# Patient Record
Sex: Female | Born: 1935 | Race: White | Hispanic: No | Smoking: Former smoker
Health system: Southern US, Community
[De-identification: ages and names within clinical notes are randomized; demographics above are authoritative.]

## PROBLEM LIST (undated history)

## (undated) DIAGNOSIS — E119 Type 2 diabetes mellitus without complications: Secondary | ICD-10-CM

## (undated) DIAGNOSIS — I10 Essential (primary) hypertension: Secondary | ICD-10-CM

## (undated) HISTORY — PX: EYE SURGERY: SHX253

---

## 1974-07-12 HISTORY — PX: ABDOMINAL HYSTERECTOMY: SUR658

## 2011-03-22 ENCOUNTER — Ambulatory Visit
Admission: RE | Admit: 2011-03-22 | Discharge: 2011-03-22 | Disposition: A | Discharge status: Home / Self Care | Payer: Medicare Other | Source: Ambulatory Visit | Attending: Orthopedic Surgery | Admitting: Orthopedic Surgery

## 2011-03-22 ENCOUNTER — Other Ambulatory Visit: Payer: Self-pay | Admitting: Orthopedic Surgery

## 2011-03-22 ENCOUNTER — Encounter (HOSPITAL_BASED_OUTPATIENT_CLINIC_OR_DEPARTMENT_OTHER)
Admission: RE | Admit: 2011-03-22 | Discharge: 2011-03-22 | Disposition: A | Discharge status: Home / Self Care | Payer: Medicare Other | Source: Ambulatory Visit | Attending: Orthopedic Surgery | Admitting: Orthopedic Surgery

## 2011-03-22 DIAGNOSIS — Z01811 Encounter for preprocedural respiratory examination: Secondary | ICD-10-CM

## 2011-03-22 LAB — BASIC METABOLIC PANEL
CO2: 28 mEq/L (ref 19–32)
Calcium: 9.9 mg/dL (ref 8.4–10.5)
Creatinine, Ser: <0.47 mg/dL — ABNORMAL LOW (ref 0.50–1.10)

## 2011-03-22 IMAGING — CR DG CHEST 2V
2 series · 2 of 2 positions shown · non-contrast
Comparison: None.

CLINICAL DATA: Preoperative respiratory evaluation prior to hand
surgery.

CHEST - 2 VIEW 03/22/2011:

[w chest pa]
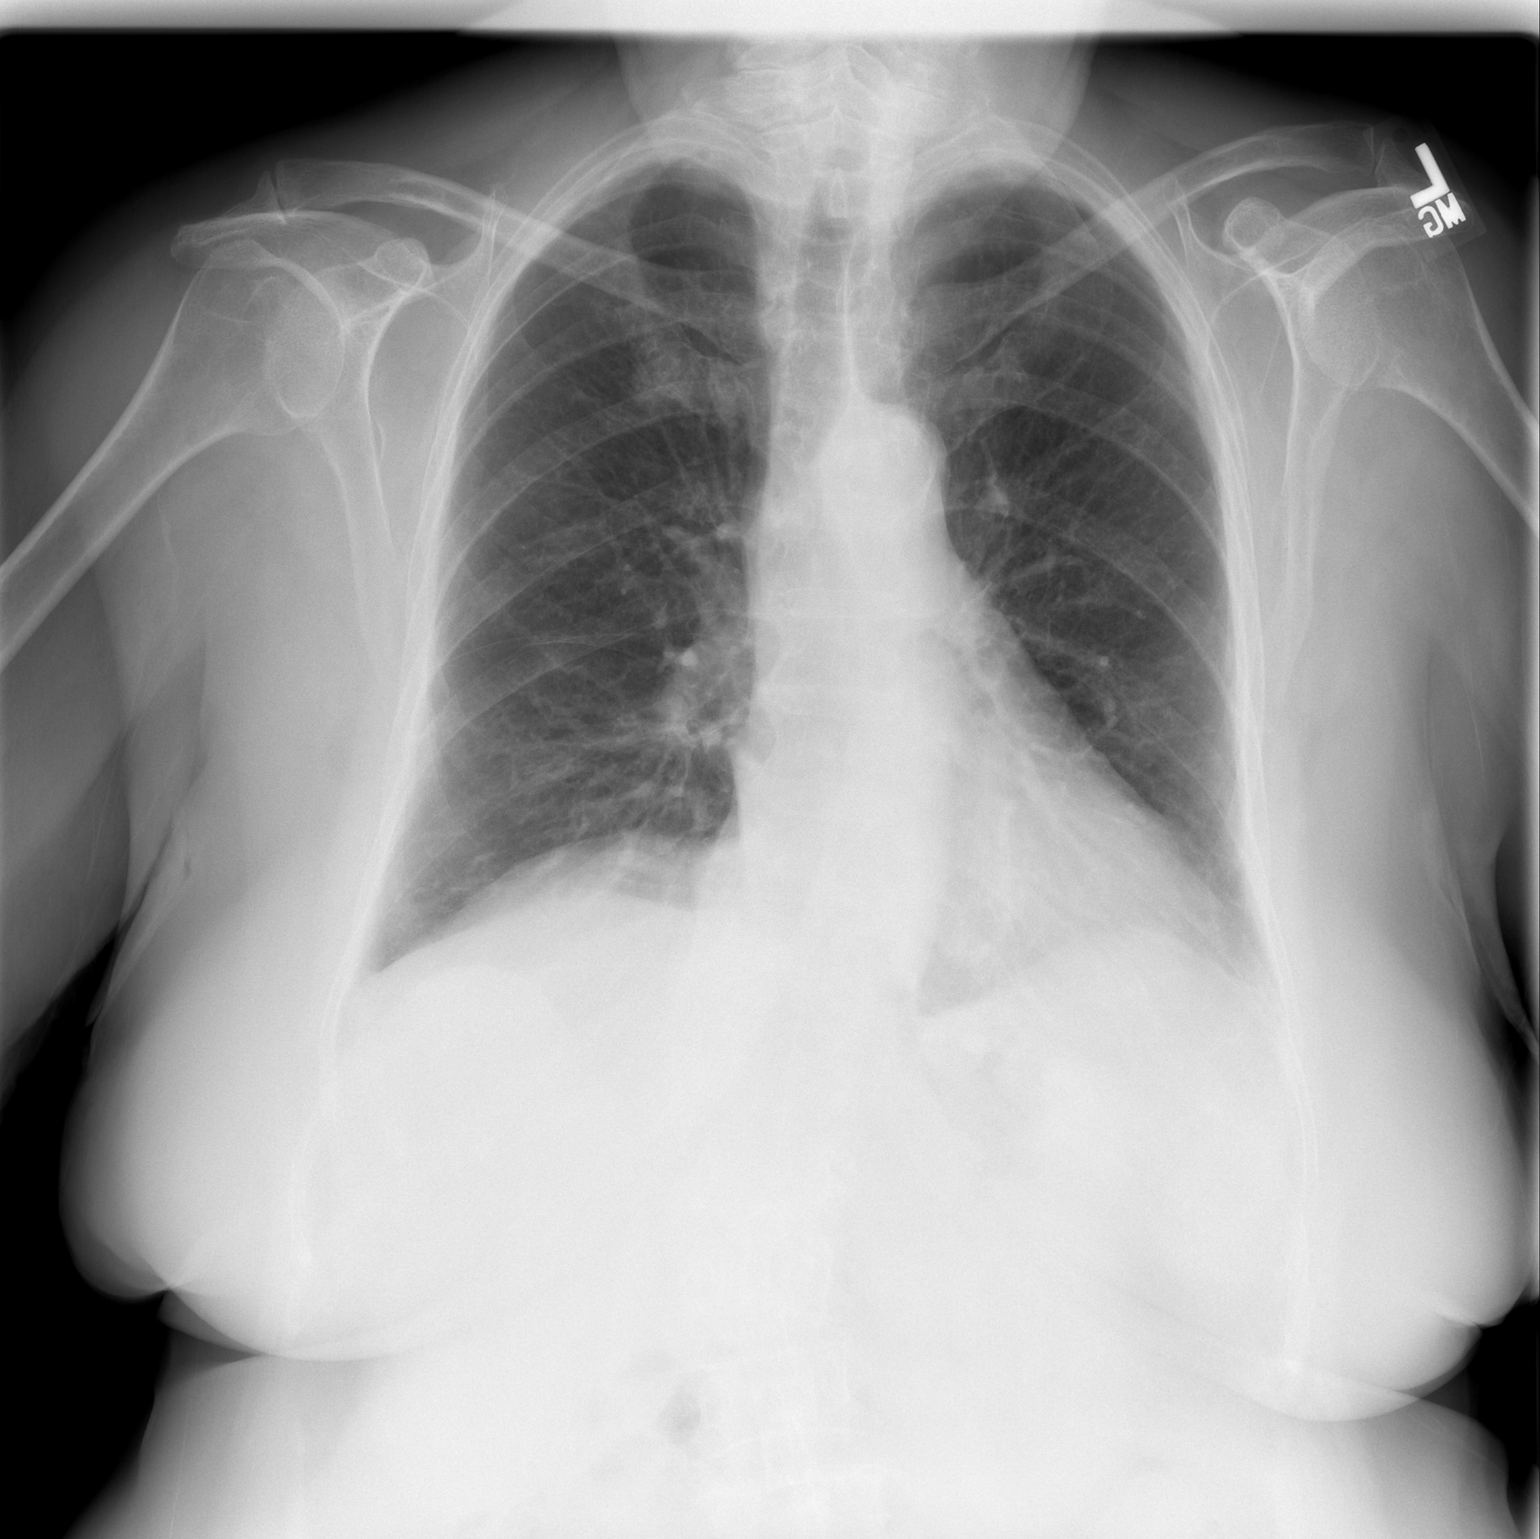

[w chest lat]
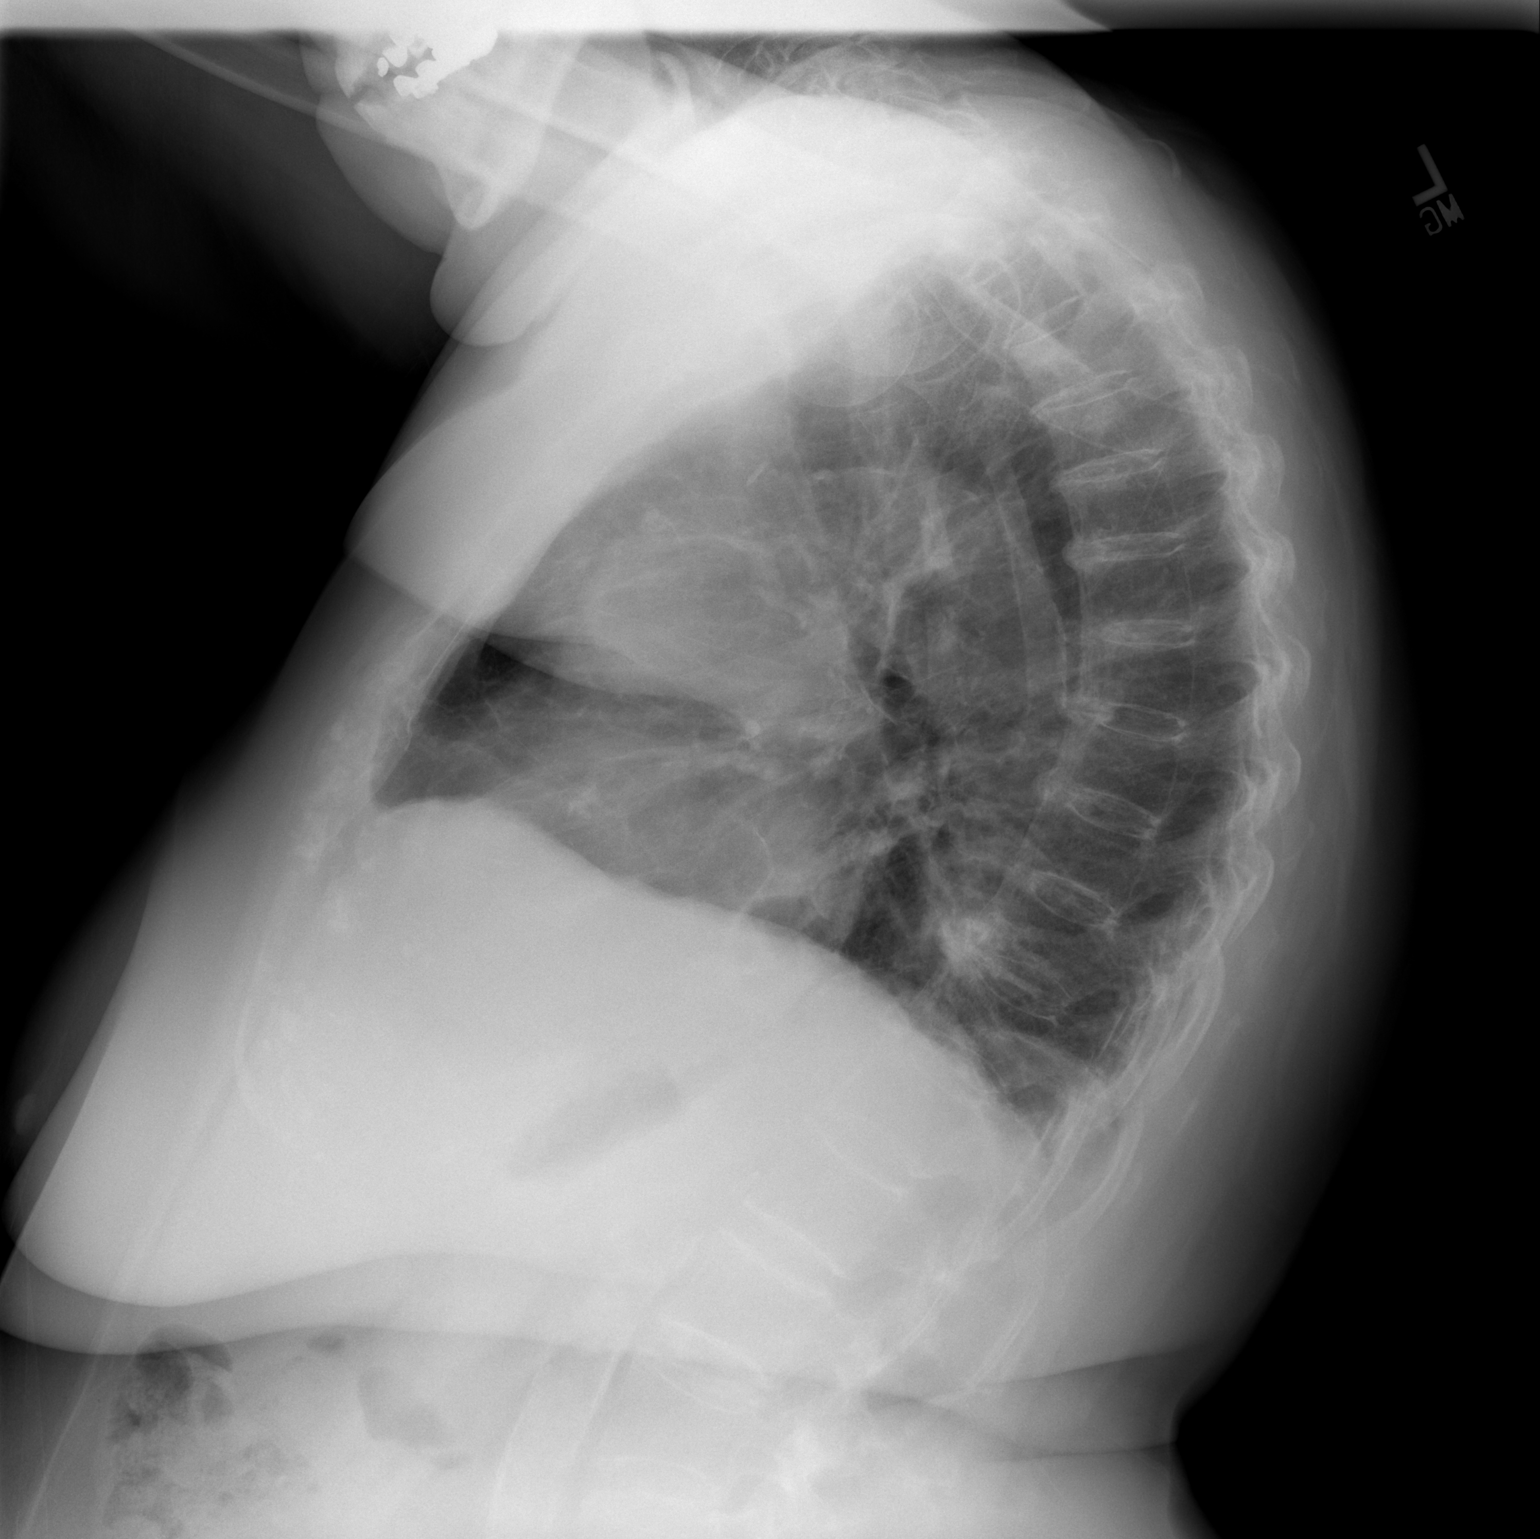

[2 of 2 positions shown; findings below may reference images not displayed]

FINDINGS: Cardiac silhouette normal in size.  Thoracic aorta
atherosclerotic.  Hilar and mediastinal contours otherwise
unremarkable.  Lungs clear.  Pulmonary vascularity normal.  No
pleural effusions.  Degenerative changes involving the thoracic
spine.
IMPRESSION: No acute cardiopulmonary disease.

## 2011-03-24 ENCOUNTER — Ambulatory Visit (HOSPITAL_BASED_OUTPATIENT_CLINIC_OR_DEPARTMENT_OTHER)
Admission: RE | Admit: 2011-03-24 | Discharge: 2011-03-24 | Disposition: A | Discharge status: Home / Self Care | Payer: Medicare Other | Source: Ambulatory Visit | Attending: Orthopedic Surgery | Admitting: Orthopedic Surgery

## 2011-03-24 DIAGNOSIS — Z01812 Encounter for preprocedural laboratory examination: Secondary | ICD-10-CM | POA: Insufficient documentation

## 2011-03-24 DIAGNOSIS — Z01810 Encounter for preprocedural cardiovascular examination: Secondary | ICD-10-CM | POA: Insufficient documentation

## 2011-03-24 DIAGNOSIS — G5600 Carpal tunnel syndrome, unspecified upper limb: Secondary | ICD-10-CM | POA: Insufficient documentation

## 2011-03-24 DIAGNOSIS — Z01818 Encounter for other preprocedural examination: Secondary | ICD-10-CM | POA: Insufficient documentation

## 2011-03-24 LAB — POCT HEMOGLOBIN-HEMACUE: Hemoglobin: 13.4 g/dL (ref 12.0–15.0)

## 2011-04-13 NOTE — Op Note (Signed)
[name], Tracey Porter           ACCOUNT NO.:  [id number]  MEDICAL RECORD NO.:  [id number]  LOCATION:                                 FACILITY:  PHYSICIAN:  Cindee Salt, M.D.       DATE OF BIRTH:  1929-10-11  DATE OF PROCEDURE:  03/24/2011 DATE OF DISCHARGE:                              OPERATIVE REPORT   PREOPERATIVE DIAGNOSIS:  Carpal tunnel syndrome, left hand.  POSTOPERATIVE DIAGNOSIS:  Carpal tunnel syndrome, left hand.  OPERATION:  Decompression of left median nerve.  SURGEON:  Cindee Salt, MD  ASSISTANT:  None.  ANESTHESIA:  Forearm-based IV regional with local infiltration.  ANESTHESIOLOGIST:  Janetta Hora. Gelene Mink, MD  HISTORY:  The patient is a 77 year old female with a history of numbness and tingling of her left hand.  She has suffered a fracture of the distal radius which has gone onto malunion, nerve conductions are positive.  She has declined having anything done to the malunion.  She is desirous of attempting to obtain relief from simple carpal tunnel release.  Pre, peri, and postoperative course have been discussed along with risks and complications.  She is aware there is no guarantee with surgery, possibility of infection, recurrence of injury to arteries, nerves, and tendons, incomplete relief of symptoms, and dystrophy.  In the preoperative area, the patient is seen, the extremity is marked by both the patient and surgeon, and antibiotic is given.  PROCEDURE:  The patient was brought to the operating room where a forearm-based IV regional anesthetic was carried out without difficulty. She was prepped using ChloraPrep, supine position, left arm free.  A 3- minute dry time was allowed.  A time-out was taken confirming the patient and procedure.  A longitudinal incision was made in the palm and carried to the ulnar side of the wrist crease and onto the distal forearm, carried down through the subcutaneous tissue.  Bleeders were electrocauterized with  bipolar.  The dissection was carried from a proximal to distal direction.  The forearm fascia was released.  A very significant band at the proximal margin of the flexor retinaculum was noted to discretely compress the median nerve.  This was released on its radial aspect to the level of the palmar arch.  The motor branch was noted.  An epineurolysis of the nerve was performed in that there was a significant hourglass deformity with pseudoneuroma proximally.  The wound was copiously irrigated with saline.  0.25% Marcaine without epinephrine was then placed on the nerve for aid in anesthesia postoperatively.  The wound again was irrigated.  The skin was closed with interrupted 4-0 Vicryl Rapide sutures.  A sterile compressive dressing was applied with the fingers free.  No splint was placed.  On deflation of the tourniquet, all fingers immediately pinked.  She was taken to the recovery room for observation in satisfactory condition.  She will be discharged home to return the [facility] in 1 week on [place] [facility].          ______________________________ Cindee Salt, M.D.     GK/MEDQ  D:  03/24/2011  T:  03/24/2011  Job:  [id number]  Electronically Signed by Cindee Salt M.D. on 04/13/2011 04:40:08  PM

## 2015-03-19 ENCOUNTER — Encounter (HOSPITAL_BASED_OUTPATIENT_CLINIC_OR_DEPARTMENT_OTHER): Payer: Self-pay

## 2015-03-19 ENCOUNTER — Observation Stay (HOSPITAL_BASED_OUTPATIENT_CLINIC_OR_DEPARTMENT_OTHER): Payer: Medicare Other

## 2015-03-19 ENCOUNTER — Inpatient Hospital Stay (HOSPITAL_BASED_OUTPATIENT_CLINIC_OR_DEPARTMENT_OTHER)
Admission: EM | Admit: 2015-03-19 | Discharge: 2015-03-20 | DRG: 641 | Disposition: A | Discharge status: Home / Self Care | Payer: Medicare Other | Attending: Internal Medicine | Admitting: Internal Medicine

## 2015-03-19 DIAGNOSIS — E119 Type 2 diabetes mellitus without complications: Secondary | ICD-10-CM | POA: Diagnosis present

## 2015-03-19 DIAGNOSIS — Z880 Allergy status to penicillin: Secondary | ICD-10-CM

## 2015-03-19 DIAGNOSIS — Z87891 Personal history of nicotine dependence: Secondary | ICD-10-CM

## 2015-03-19 DIAGNOSIS — E871 Hypo-osmolality and hyponatremia: Secondary | ICD-10-CM | POA: Diagnosis not present

## 2015-03-19 DIAGNOSIS — E860 Dehydration: Secondary | ICD-10-CM | POA: Diagnosis present

## 2015-03-19 DIAGNOSIS — Z79899 Other long term (current) drug therapy: Secondary | ICD-10-CM

## 2015-03-19 DIAGNOSIS — R197 Diarrhea, unspecified: Secondary | ICD-10-CM | POA: Diagnosis present

## 2015-03-19 DIAGNOSIS — I10 Essential (primary) hypertension: Secondary | ICD-10-CM | POA: Diagnosis present

## 2015-03-19 DIAGNOSIS — N390 Urinary tract infection, site not specified: Secondary | ICD-10-CM | POA: Diagnosis present

## 2015-03-19 DIAGNOSIS — E876 Hypokalemia: Secondary | ICD-10-CM | POA: Diagnosis present

## 2015-03-19 HISTORY — DX: Type 2 diabetes mellitus without complications: E11.9

## 2015-03-19 HISTORY — DX: Essential (primary) hypertension: I10

## 2015-03-19 LAB — URINE MICROSCOPIC-ADD ON

## 2015-03-19 LAB — COMPREHENSIVE METABOLIC PANEL
ALK PHOS: 65 U/L (ref 38–126)
ALT: 16 U/L (ref 14–54)
ANION GAP: 13 (ref 5–15)
AST: 18 U/L (ref 15–41)
Albumin: 3.3 g/dL — ABNORMAL LOW (ref 3.5–5.0)
BILIRUBIN TOTAL: 0.9 mg/dL (ref 0.3–1.2)
BUN: 21 mg/dL — ABNORMAL HIGH (ref 6–20)
CALCIUM: 7.9 mg/dL — AB (ref 8.9–10.3)
CO2: 27 mmol/L (ref 22–32)
Chloride: 85 mmol/L — ABNORMAL LOW (ref 101–111)
Creatinine, Ser: 0.82 mg/dL (ref 0.44–1.00)
Glucose, Bld: 134 mg/dL — ABNORMAL HIGH (ref 65–99)
Potassium: 2.8 mmol/L — ABNORMAL LOW (ref 3.5–5.1)
SODIUM: 125 mmol/L — AB (ref 135–145)
TOTAL PROTEIN: 6.9 g/dL (ref 6.5–8.1)

## 2015-03-19 LAB — URINALYSIS, ROUTINE W REFLEX MICROSCOPIC
Bilirubin Urine: NEGATIVE
Glucose, UA: NEGATIVE mg/dL
HGB URINE DIPSTICK: NEGATIVE
KETONES UR: 15 mg/dL — AB
Nitrite: NEGATIVE
PROTEIN: NEGATIVE mg/dL
Specific Gravity, Urine: 1.008 (ref 1.005–1.030)
UROBILINOGEN UA: 0.2 mg/dL (ref 0.0–1.0)
pH: 6 (ref 5.0–8.0)

## 2015-03-19 LAB — CBC WITH DIFFERENTIAL/PLATELET
BASOS PCT: 0 % (ref 0–1)
Basophils Absolute: 0 10*3/uL (ref 0.0–0.1)
EOS ABS: 0 10*3/uL (ref 0.0–0.7)
Eosinophils Relative: 0 % (ref 0–5)
HCT: 35.1 % — ABNORMAL LOW (ref 36.0–46.0)
HEMOGLOBIN: 12.2 g/dL (ref 12.0–15.0)
Lymphocytes Relative: 5 % — ABNORMAL LOW (ref 12–46)
Lymphs Abs: 0.8 10*3/uL (ref 0.7–4.0)
MCH: 31.5 pg (ref 26.0–34.0)
MCHC: 34.8 g/dL (ref 30.0–36.0)
MCV: 90.7 fL (ref 78.0–100.0)
Monocytes Absolute: 1.5 10*3/uL — ABNORMAL HIGH (ref 0.1–1.0)
Monocytes Relative: 10 % (ref 3–12)
NEUTROS PCT: 85 % — AB (ref 43–77)
Neutro Abs: 12.8 10*3/uL — ABNORMAL HIGH (ref 1.7–7.7)
PLATELETS: 294 10*3/uL (ref 150–400)
RBC: 3.87 MIL/uL (ref 3.87–5.11)
RDW: 12.3 % (ref 11.5–15.5)
WBC: 15.1 10*3/uL — AB (ref 4.0–10.5)

## 2015-03-19 LAB — LIPASE, BLOOD: LIPASE: 20 U/L — AB (ref 22–51)

## 2015-03-19 IMAGING — CR DG ABDOMEN 1V
1 series · 1 of 1 positions shown · non-contrast
Comparison: None.

CLINICAL DATA: Vomiting for 2 days. History of partial colectomy
for years ago. Diverticulitis.

EXAM:
ABDOMEN - 1 VIEW

[t abdomen supine]
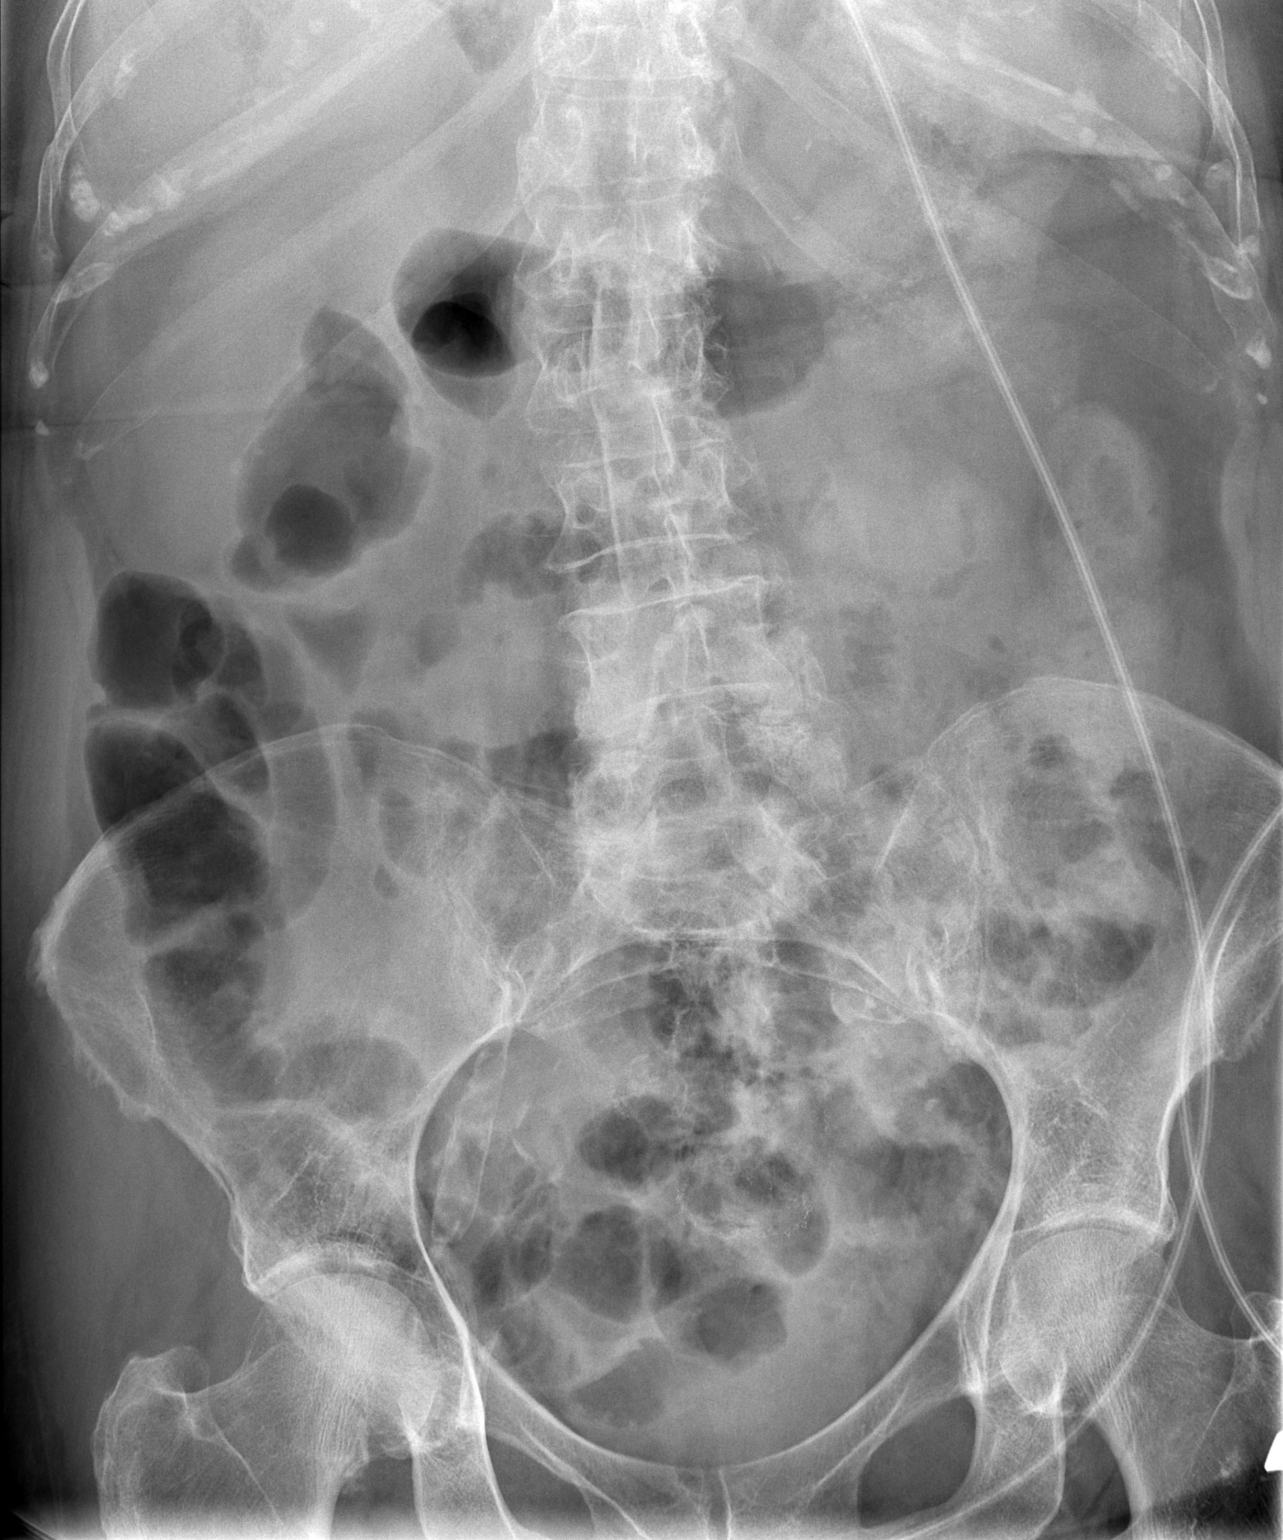

[1 of 1 positions shown; findings below may reference images not displayed]

FINDINGS: The bowel gas pattern is normal. No radio-opaque calculi or other
significant radiographic abnormality are seen. Postoperative changes
in the pelvis. Degenerative changes in the spine and hips.
IMPRESSION: Normal nonobstructive bowel gas pattern.

## 2015-03-19 MED ORDER — SODIUM CHLORIDE 0.9 % IV SOLN
INTRAVENOUS | Status: DC
  Administered 2015-03-20 (×2): via INTRAVENOUS

## 2015-03-19 MED ORDER — POTASSIUM CHLORIDE 10 MEQ/100ML IV SOLN
10.0000 meq | INTRAVENOUS | Status: AC
  Administered 2015-03-20 (×3): 10 meq via INTRAVENOUS
  Filled 2015-03-19 (×3): qty 100

## 2015-03-19 MED ORDER — ONDANSETRON HCL 4 MG/2ML IJ SOLN
4.0000 mg | Freq: Once | INTRAMUSCULAR | Status: AC
  Administered 2015-03-19: 4 mg via INTRAVENOUS
  Filled 2015-03-19: qty 2

## 2015-03-19 MED ORDER — LISINOPRIL 10 MG PO TABS: 10.0000 mg | ORAL_TABLET | Freq: Every morning | ORAL | Status: DC

## 2015-03-19 MED ORDER — INSULIN ASPART 100 UNIT/ML ~~LOC~~ SOLN: 0.0000 [IU] | SUBCUTANEOUS | Status: DC

## 2015-03-19 MED ORDER — DEXTROSE 5 % IV SOLN
1.0000 g | INTRAVENOUS | Status: DC
  Filled 2015-03-19: qty 10

## 2015-03-19 MED ORDER — SODIUM CHLORIDE 0.9 % IV BOLUS (SEPSIS)
1000.0000 mL | Freq: Once | INTRAVENOUS | Status: AC
  Administered 2015-03-19: 1000 mL via INTRAVENOUS

## 2015-03-19 MED ORDER — POTASSIUM CHLORIDE CRYS ER 20 MEQ PO TBCR
40.0000 meq | EXTENDED_RELEASE_TABLET | Freq: Once | ORAL | Status: AC
  Administered 2015-03-19: 40 meq via ORAL
  Filled 2015-03-19: qty 2

## 2015-03-19 MED ORDER — CEFTRIAXONE SODIUM 1 G IJ SOLR
INTRAMUSCULAR | Status: AC
  Filled 2015-03-19: qty 10

## 2015-03-19 MED ORDER — DEXTROSE 5 % IV SOLN
1.0000 g | Freq: Once | INTRAVENOUS | Status: AC
  Administered 2015-03-19: 1 g via INTRAVENOUS

## 2015-03-19 MED ORDER — POTASSIUM CHLORIDE 10 MEQ/100ML IV SOLN
10.0000 meq | Freq: Once | INTRAVENOUS | Status: AC
  Administered 2015-03-19: 10 meq via INTRAVENOUS
  Filled 2015-03-19: qty 100

## 2015-03-19 MED ORDER — ACYCLOVIR 200 MG PO CAPS
800.0000 mg | ORAL_CAPSULE | Freq: Two times a day (BID) | ORAL | Status: DC
  Administered 2015-03-20 (×2): 800 mg via ORAL
  Filled 2015-03-19 (×3): qty 4

## 2015-03-19 MED ORDER — ENOXAPARIN SODIUM 40 MG/0.4ML ~~LOC~~ SOLN
40.0000 mg | Freq: Every day | SUBCUTANEOUS | Status: DC
  Administered 2015-03-20: 40 mg via SUBCUTANEOUS
  Filled 2015-03-19: qty 0.4

## 2015-03-19 MED ORDER — ONDANSETRON HCL 4 MG/2ML IJ SOLN
4.0000 mg | Freq: Four times a day (QID) | INTRAMUSCULAR | Status: DC | PRN
  Administered 2015-03-20: 4 mg via INTRAVENOUS
  Filled 2015-03-19: qty 2

## 2015-03-19 MED ORDER — LABETALOL HCL 200 MG PO TABS
200.0000 mg | ORAL_TABLET | Freq: Two times a day (BID) | ORAL | Status: DC
  Administered 2015-03-20: 200 mg via ORAL
  Filled 2015-03-19: qty 1

## 2015-03-19 NOTE — ED Notes (Signed)
C/o vomiting started last night-also c/o nausea, weight loss, increase thirst, dizziness

## 2015-03-19 NOTE — ED Notes (Signed)
Pt transferred to [facility] via [facility] for admission, belongings with patient

## 2015-03-19 NOTE — ED Provider Notes (Signed)
CSN: [id number]     Arrival date & time 03/19/15  1745 History  This chart was scribed for Mirian Mo, MD by Tanda Rockers, ED Scribe. This patient was seen in room MH11/MH11 and the patient's care was started at 6:16 PM.   Chief Complaint  Patient presents with  . Emesis   Patient is a 81 y.o. female presenting with vomiting. The history is provided by the patient. No language interpreter was used.  Emesis Severity:  Moderate Duration:  1 day Timing:  Sporadic Quality:  Unable to specify Chronicity:  New Relieved by:  None tried Worsened by:  Nothing tried Ineffective treatments:  None tried Associated symptoms: headaches   Associated symptoms: no cough, no diarrhea, no fever and no sore throat   Risk factors: diabetes   Risk factors: no sick contacts and no suspect food intake      HPI Comments: Tracey Porter is a 81 y.o. female with hx DM and HTN who presents to the Emergency Department complaining of nausea and vomiting that began last night around 8 PM. Pt also complains of fatigue, headache, and polydipsia. Pt denies eating any suspicious food recently and family states that pt has not eaten very much since symptoms began. Pt was seen today by PCP and had basic blood work done. She was also prescribed Zofran but has not taken any for her symptoms yet. Pt's blood glucose level was 160 mg/dL around 1 PM today. She denies any sick contact with similar symptoms. Denies fever, diarrhea, chest pain, shortness of breath, numbness, tingling, sore throat, cough, or any other associated symptoms.   Past Medical History  Diagnosis Date  . Diabetes mellitus without complication   . Hypertension    Past Surgical History  Procedure Laterality Date  . Eye surgery     No family history on file. Social History  Substance Use Topics  . Smoking status: Former Games developer  . Smokeless tobacco: None  . Alcohol Use: Yes   OB History    No data available     Review of Systems   Constitutional: Positive for fatigue. Negative for fever.  HENT: Negative for sore throat.   Respiratory: Negative for cough and shortness of breath.   Cardiovascular: Negative for chest pain.  Gastrointestinal: Positive for nausea and vomiting. Negative for diarrhea.  Endocrine: Positive for polydipsia.  Neurological: Positive for headaches. Negative for numbness.  All other systems reviewed and are negative.  Allergies  Penicillins  Home Medications   Prior to Admission medications   Medication Sig Start Date End Date Taking? Authorizing Provider  ACYCLOVIR PO Take 800 mg by mouth 2 (two) times daily.    Yes Historical Provider, MD  ALENDRONATE SODIUM PO Take 70 mg by mouth once a week.    Yes Historical Provider, MD  ATORVASTATIN CALCIUM PO Take 40 mg by mouth every morning.    Yes Historical Provider, MD  LABETALOL HCL PO Take 200 mg by mouth 2 (two) times daily.    Yes Historical Provider, MD  LISINOPRIL PO Take 10 mg by mouth every morning.    Yes Historical Provider, MD  METFORMIN HCL PO Take 500 mg by mouth every morning.    Yes Historical Provider, MD  triamterene-hydrochlorothiazide (DYAZIDE) 37.5-25 MG per capsule Take by mouth daily.    Yes Historical Provider, MD   Triage Vitals: BP 125/59 mmHg  Pulse 76  Temp(Src) 98.4 F (36.9 C) (Oral)  Resp 18  Ht 5\' 6"  (1.676 m)  Wt 169  lb (76.658 kg)  BMI 27.29 kg/m2  SpO2 96%   Physical Exam  Constitutional: She is oriented to person, place, and time. She appears well-developed and well-nourished.  HENT:  Head: Normocephalic and atraumatic.  Right Ear: External ear normal.  Left Ear: External ear normal.  Eyes: Conjunctivae and EOM are normal. Pupils are equal, round, and reactive to light.  Neck: Normal range of motion. Neck supple.  Cardiovascular: Normal rate, regular rhythm, normal heart sounds and intact distal pulses.   Pulmonary/Chest: Effort normal and breath sounds normal.  Abdominal: Soft. Bowel sounds are  normal. There is no tenderness.  Musculoskeletal: Normal range of motion.  Neurological: She is alert and oriented to person, place, and time.  Skin: Skin is warm and dry.  Vitals reviewed.   ED Course  Procedures (including critical care time)  DIAGNOSTIC STUDIES: Oxygen Saturation is 96% on RA, normal by my interpretation.    COORDINATION OF CARE: 6:20 PM-Discussed treatment plan which includes IV Fluids with pt at bedside and pt agreed to plan.   Labs Review Labs Reviewed  COMPREHENSIVE METABOLIC PANEL - Abnormal; Notable for the following:    Sodium 125 (*)    Potassium 2.8 (*)    Chloride 85 (*)    Glucose, Bld 134 (*)    BUN 21 (*)    Calcium 7.9 (*)    Albumin 3.3 (*)    All other components within normal limits  LIPASE, BLOOD - Abnormal; Notable for the following:    Lipase 20 (*)    All other components within normal limits  CBC WITH DIFFERENTIAL/PLATELET - Abnormal; Notable for the following:    WBC 15.1 (*)    HCT 35.1 (*)    Neutrophils Relative % 85 (*)    Neutro Abs 12.8 (*)    Lymphocytes Relative 5 (*)    Monocytes Absolute 1.5 (*)    All other components within normal limits  URINALYSIS, ROUTINE W REFLEX MICROSCOPIC (NOT AT [facility]) - Abnormal; Notable for the following:    APPearance CLOUDY (*)    Ketones, ur 15 (*)    Leukocytes, UA LARGE (*)    All other components within normal limits  URINE MICROSCOPIC-ADD ON - Abnormal; Notable for the following:    Squamous Epithelial / LPF FEW (*)    Bacteria, UA MANY (*)    All other components within normal limits    Imaging Review Dg Abd 1 View  03/19/2015   CLINICAL DATA:  Vomiting for 2 days. History of partial colectomy for years ago. Diverticulitis.  EXAM: ABDOMEN - 1 VIEW  COMPARISON:  None.  FINDINGS: The bowel gas pattern is normal. No radio-opaque calculi or other significant radiographic abnormality are seen. Postoperative changes in the pelvis. Degenerative changes in the spine and hips.   IMPRESSION: Normal nonobstructive bowel gas pattern.   Electronically Signed   By: Burman Nieves M.D.   On: 03/19/2015 21:24   I have personally reviewed and evaluated these images and lab results as part of my medical decision-making.   EKG Interpretation None      Date: 03/19/2015  Rate:79   Rhythm: normal sinus rhythm  QRS Axis: normal  Intervals: normal  ST/T Wave abnormalities: normal  Conduction Disutrbances: none  Narrative Interpretation: unremarkable     MDM   Final diagnoses:  None    81 y.o. female with pertinent PMH of DM, HTN presents with nausea, vomiting, crampy abdominal pain. Symptoms present over the last 2-3 days, vomiting worsened  over the last 24 hours.  On arrival today vitals signs and physical exam as above.  No abdominal tenderness.  Workup as above demonstrated significant hypokalemia, hyponatremia. This is likely dehydration mediated. Consulted medicine for admission. Transferred to [facility] without event..    I have reviewed all laboratory and imaging studies if ordered as above  Hypokalemia      Mirian Mo, MD 03/19/15 2244

## 2015-03-19 NOTE — ED Notes (Signed)
Patient placed on cardiac monitor.

## 2015-03-20 DIAGNOSIS — N390 Urinary tract infection, site not specified: Secondary | ICD-10-CM

## 2015-03-20 DIAGNOSIS — I10 Essential (primary) hypertension: Secondary | ICD-10-CM | POA: Diagnosis present

## 2015-03-20 DIAGNOSIS — E860 Dehydration: Secondary | ICD-10-CM | POA: Diagnosis present

## 2015-03-20 DIAGNOSIS — Z79899 Other long term (current) drug therapy: Secondary | ICD-10-CM | POA: Diagnosis not present

## 2015-03-20 DIAGNOSIS — E871 Hypo-osmolality and hyponatremia: Secondary | ICD-10-CM | POA: Diagnosis present

## 2015-03-20 DIAGNOSIS — Z880 Allergy status to penicillin: Secondary | ICD-10-CM | POA: Diagnosis not present

## 2015-03-20 DIAGNOSIS — E119 Type 2 diabetes mellitus without complications: Secondary | ICD-10-CM

## 2015-03-20 DIAGNOSIS — R197 Diarrhea, unspecified: Secondary | ICD-10-CM | POA: Diagnosis not present

## 2015-03-20 DIAGNOSIS — E876 Hypokalemia: Secondary | ICD-10-CM

## 2015-03-20 DIAGNOSIS — Z87891 Personal history of nicotine dependence: Secondary | ICD-10-CM | POA: Diagnosis not present

## 2015-03-20 LAB — CBC
HCT: 32.5 % — ABNORMAL LOW (ref 36.0–46.0)
Hemoglobin: 11.1 g/dL — ABNORMAL LOW (ref 12.0–15.0)
MCH: 30.8 pg (ref 26.0–34.0)
MCHC: 34.2 g/dL (ref 30.0–36.0)
MCV: 90.3 fL (ref 78.0–100.0)
PLATELETS: 277 10*3/uL (ref 150–400)
RBC: 3.6 MIL/uL — ABNORMAL LOW (ref 3.87–5.11)
RDW: 12.8 % (ref 11.5–15.5)
WBC: 15.4 10*3/uL — ABNORMAL HIGH (ref 4.0–10.5)

## 2015-03-20 LAB — BASIC METABOLIC PANEL
Anion gap: 12 (ref 5–15)
BUN: 10 mg/dL (ref 6–20)
CALCIUM: 7.1 mg/dL — AB (ref 8.9–10.3)
CO2: 24 mmol/L (ref 22–32)
CREATININE: 0.82 mg/dL (ref 0.44–1.00)
Chloride: 93 mmol/L — ABNORMAL LOW (ref 101–111)
GFR calc Af Amer: >60 mL/min (ref >60)
GFR calc non Af Amer: >60 mL/min (ref >60)
GLUCOSE: 119 mg/dL — AB (ref 65–99)
Potassium: 3.5 mmol/L (ref 3.5–5.1)
Sodium: 129 mmol/L — ABNORMAL LOW (ref 135–145)

## 2015-03-20 LAB — GLUCOSE, CAPILLARY
GLUCOSE-CAPILLARY: 121 mg/dL — AB (ref 65–99)
GLUCOSE-CAPILLARY: 123 mg/dL — AB (ref 65–99)
GLUCOSE-CAPILLARY: 129 mg/dL — AB (ref 65–99)
Glucose-Capillary: 127 mg/dL — ABNORMAL HIGH (ref 65–99)

## 2015-03-20 MED ORDER — CIPROFLOXACIN HCL 250 MG PO TABS: 250.0000 mg | ORAL_TABLET | Freq: Two times a day (BID) | ORAL | Status: DC

## 2015-03-20 NOTE — Progress Notes (Addendum)
New Admission Note:   Arrival: Via stretcher from [facility] with [facility] Mental Orientation: A&Ox4, forgetful Telemetry: Placed on box 6e24 Assessment:  See doc flowsheet Skin: Intact IV: Right forearm IV Pain: None Safety Measures:  Call bell placed within reach; patient instructed on use of call bell and verbalized understanding. Bed in lowest position.  Non-skid socks on.  Bed alarm refused.  Stated she will call when she needs to use the bathroom. [address] Orientation: Patient oriented to staff, room, and unit. Family: None at bedside  Orders have been reviewed and implemented. Patient refused admission education at this time.  Stated that she is too tired.  Will defer to tomorrow.  Med list reconciliation unable to complete; patient cannot remember, but states her daughter will be here tomorrow to answer questions.  Will continue to monitor.  Rozann Lesches, RN, BSN

## 2015-03-20 NOTE — Progress Notes (Signed)
Patient stated that she needed to take her metformin tonight.  Notified NP on-call.  NP stated that they do not order metformin at the hospital.  No new orders at this time.  Will continue to monitor.

## 2015-03-20 NOTE — Progress Notes (Signed)
Discharge instructions and medications discussed with patient.  Prescription given to patient.  All questions answered.  

## 2015-03-20 NOTE — H&P (Addendum)
[facility] History and Physical  Yaniris Braddock ZOX:[id number] DOB: 27-Aug-1929 DOA: 03/19/2015  Referring physician: EDP PCP: No primary care provider on file.   Chief Complaint: Emesis   HPI: Tracey Porter is a 81 y.o. female who presents to the ED with c/o abdominal pain, nausea, and diarrhea.  Symptoms onset on 7/6 and persisted throughout the day.  Poor PO intake throughout the day.  She presented to the ED at [facility] due to feelings of worsening dehydration.  Review of Systems: Systems reviewed.  As above, otherwise negative  Past Medical History  Diagnosis Date  . Diabetes mellitus without complication   . Hypertension    Past Surgical History  Procedure Laterality Date  . Eye surgery     Social History:  reports that she has quit smoking. She does not have any smokeless tobacco history on file. She reports that she drinks alcohol. She reports that she does not use illicit drugs.  Allergies  Allergen Reactions  . Penicillins Rash    No family history on file.   Prior to Admission medications   Medication Sig Start Date End Date Taking? Authorizing Provider  ACYCLOVIR PO Take 800 mg by mouth 2 (two) times daily.    Yes Historical Provider, MD  ALENDRONATE SODIUM PO Take 70 mg by mouth once a week.    Yes Historical Provider, MD  ATORVASTATIN CALCIUM PO Take 40 mg by mouth every morning.    Yes Historical Provider, MD  LABETALOL HCL PO Take 200 mg by mouth 2 (two) times daily.    Yes Historical Provider, MD  LISINOPRIL PO Take 10 mg by mouth every morning.    Yes Historical Provider, MD  METFORMIN HCL PO Take 500 mg by mouth every morning.    Yes Historical Provider, MD  triamterene-hydrochlorothiazide (DYAZIDE) 37.5-25 MG per capsule Take by mouth daily.    Yes Historical Provider, MD   Physical Exam: Filed Vitals:   03/20/15 0412  BP: 99/46  Pulse: 94  Temp: 98.7 F (37.1 C)  Resp: 18    BP 99/46 mmHg  Pulse 94  Temp(Src) 98.7 F (37.1 C) (Oral)   Resp 18  Ht  (1.676 m)  Wt 78.3 kg (172 lb 9.9 oz)  BMI 27.87 kg/m2  SpO2 97%  General Appearance:    Alert, oriented, no distress, appears stated age  Head:    Normocephalic, atraumatic  Eyes:    PERRL, EOMI, sclera non-icteric        Nose:   Nares without drainage or epistaxis. Mucosa, turbinates normal  Throat:   Moist mucous membranes. Oropharynx without erythema or exudate.  Neck:   Supple. No carotid bruits.  No thyromegaly.  No lymphadenopathy.   Back:     No CVA tenderness, no spinal tenderness  Lungs:     Clear to auscultation bilaterally, without wheezes, rhonchi or rales  Chest wall:    No tenderness to palpitation  Heart:    Regular rate and rhythm without murmurs, gallops, rubs  Abdomen:     Soft, non-tender, nondistended, normal bowel sounds, no organomegaly  Genitalia:    deferred  Rectal:    deferred  Extremities:   No clubbing, cyanosis or edema.  Pulses:   2+ and symmetric all extremities  Skin:   Skin color, texture, turgor normal, no rashes or lesions  Lymph nodes:   Cervical, supraclavicular, and axillary nodes normal  Neurologic:   CNII-XII intact. Normal strength, sensation and reflexes      throughout  Labs on Admission:  Basic Metabolic Panel:  Recent Labs Lab 03/19/15 1840  NA 125*  K 2.8*  CL 85*  CO2 27  GLUCOSE 134*  BUN 21*  CREATININE 0.82  CALCIUM 7.9*   Liver Function Tests:  Recent Labs Lab 03/19/15 1840  AST 18  ALT 16  ALKPHOS 65  BILITOT 0.9  PROT 6.9  ALBUMIN 3.3*    Recent Labs Lab 03/19/15 1840  LIPASE 20*   No results for input(s): AMMONIA in the last 168 hours. CBC:  Recent Labs Lab 03/19/15 1840  WBC 15.1*  NEUTROABS 12.8*  HGB 12.2  HCT 35.1*  MCV 90.7  PLT 294   Cardiac Enzymes: No results for input(s): CKTOTAL, CKMB, CKMBINDEX, TROPONINI in the last 168 hours.  BNP (last 3 results) No results for input(s): PROBNP in the last 8760 hours. CBG:  Recent Labs Lab 03/20/15 0038  GLUCAP  121*    Radiological Exams on Admission: Dg Abd 1 View  03/19/2015   CLINICAL DATA:  Vomiting for 2 days. History of partial colectomy for years ago. Diverticulitis.  EXAM: ABDOMEN - 1 VIEW  COMPARISON:  None.  FINDINGS: The bowel gas pattern is normal. No radio-opaque calculi or other significant radiographic abnormality are seen. Postoperative changes in the pelvis. Degenerative changes in the spine and hips.  IMPRESSION: Normal nonobstructive bowel gas pattern.   Electronically Signed   By: Burman Nieves M.D.   On: 03/19/2015 21:24    EKG: Independently reviewed.  Assessment/Plan Principal Problem:   Dehydration with hyponatremia Active Problems:   Hypokalemia   UTI (urinary tract infection)   Diarrhea   DM2 (diabetes mellitus, type 2)   1. Dehydration with hyponatremia and hypokalemia - 1. IVF 2. Repeat BMP in AM 3. Replace K 4. Hold diuretics due to dehydration 2. UTI - 1. Cultures pending 2. Empiric rocephin 3. DM2 - 1. Hold metformin 2. Use sensitive scale SSI Q4H 4. HTN - 1. Hold diuretics due to dehydration 2. Use PRN med if needed    Code Status: Full  Family Communication: No family in room Disposition Plan: admit to obs   Time spent: 70 min  Addysen Louth M. [facility] Pager [phone]  If 7AM-7PM, please contact the day team taking care of the patient [email or web address] Password [email or web address] 03/20/2015, 5:07 AM

## 2015-03-20 NOTE — Discharge Summary (Signed)
Physician Discharge Summary  Coralyn Roselli BJY:[id number] DOB: 1929/09/04 DOA: 03/19/2015  PCP: No primary care provider on file.  Admit date: 03/19/2015 Discharge date: 03/20/2015  Time spent: 45 minutes  Recommendations for Outpatient Follow-up:  1. PCP in 1 week, monitor BP, Consider resuming lisinopril at low dose upon FU if appropriate  Discharge Diagnoses:  Principal Problem:   Dehydration with hyponatremia Active Problems:   Hypokalemia   UTI (urinary tract infection)   Diarrhea   DM2 (diabetes mellitus, type 2)   Discharge Condition: stable  Diet recommendation: Diabetic  Filed Weights   03/19/15 1757 03/20/15 0040  Weight: 76.658 kg (169 lb) 78.3 kg (172 lb 9.9 oz)    History of present illness:  Chief Complaint: Emesis HPI: Tracey Porter is a 81 y.o. female who presented to the ED with c/o abdominal pain, nausea, and diarrhea. Symptoms started on 7/6 and persisted throughout the day with poor PO intake, She presented to the ED at [facility].  Hospital Course:   1. Nausea/Vomiting and Diarrhea -likely viral gastroenteritis, symptoms resolved and tolerating diet at the time fo discharge  2. Hyponatremia -due to dehydration and HCTZ -hydrated overnight, HCTZ stopped -Na improved from 125 to 129 at discharge  3. UTI -started on Po cipro, FU cultures -received Rocephin on admission  4. DM2  -resumed metformin, SSI used inpatient  5. HTN  -BP was soft on admission due to dehydration -improved with hydration, HCTZ and lisinopril stopped at discharge, can resume lisinopril at low dose upon FU if appropriate    Discharge Exam: Filed Vitals:   03/20/15 1020  BP: 104/52  Pulse: 87  Temp:   Resp:     General: AAOx3 Cardiovascular: S1S2/RRR Respiratory: CTAB  Discharge Instructions    Current Discharge Medication List    CONTINUE these medications which have NOT CHANGED   Details  ACYCLOVIR PO Take 800 mg by mouth 2 (two) times daily.      ALENDRONATE SODIUM PO Take 70 mg by mouth once a week.     ATORVASTATIN CALCIUM PO Take 40 mg by mouth every morning.     LABETALOL HCL PO Take 200 mg by mouth 2 (two) times daily.     METFORMIN HCL PO Take 500 mg by mouth every morning.       STOP taking these medications     LISINOPRIL PO      triamterene-hydrochlorothiazide (DYAZIDE) 37.5-25 MG per capsule        Allergies  Allergen Reactions  . Penicillins Rash      The results of significant diagnostics from this hospitalization (including imaging, microbiology, ancillary and laboratory) are listed below for reference.    Significant Diagnostic Studies: Dg Abd 1 View  03/19/2015   CLINICAL DATA:  Vomiting for 2 days. History of partial colectomy for years ago. Diverticulitis.  EXAM: ABDOMEN - 1 VIEW  COMPARISON:  None.  FINDINGS: The bowel gas pattern is normal. No radio-opaque calculi or other significant radiographic abnormality are seen. Postoperative changes in the pelvis. Degenerative changes in the spine and hips.  IMPRESSION: Normal nonobstructive bowel gas pattern.   Electronically Signed   By: Burman Nieves M.D.   On: 03/19/2015 21:24    Microbiology: No results found for this or any previous visit (from the past 240 hour(s)).   Labs: Basic Metabolic Panel:  Recent Labs Lab 03/19/15 1840 03/20/15 0611  NA 125* 129*  K 2.8* 3.5  CL 85* 93*  CO2 27 24  GLUCOSE 134* 119*  BUN 21* 10  CREATININE 0.82 0.82  CALCIUM 7.9* 7.1*   Liver Function Tests:  Recent Labs Lab 03/19/15 1840  AST 18  ALT 16  ALKPHOS 65  BILITOT 0.9  PROT 6.9  ALBUMIN 3.3*    Recent Labs Lab 03/19/15 1840  LIPASE 20*   No results for input(s): AMMONIA in the last 168 hours. CBC:  Recent Labs Lab 03/19/15 1840 03/20/15 0611  WBC 15.1* 15.4*  NEUTROABS 12.8*  --   HGB 12.2 11.1*  HCT 35.1* 32.5*  MCV 90.7 90.3  PLT 294 277   Cardiac Enzymes: No results for input(s): CKTOTAL, CKMB, CKMBINDEX, TROPONINI in  the last 168 hours. BNP: BNP (last 3 results) No results for input(s): BNP in the last 8760 hours.  ProBNP (last 3 results) No results for input(s): PROBNP in the last 8760 hours.  CBG:  Recent Labs Lab 03/20/15 0038 03/20/15 0411 03/20/15 0723 03/20/15 1126  GLUCAP 121* 129* 127* 123*       Signed:  Rehanna Oloughlin  [facility] 03/20/2015, 12:33 PM

## 2016-04-20 ENCOUNTER — Emergency Department (HOSPITAL_BASED_OUTPATIENT_CLINIC_OR_DEPARTMENT_OTHER)
Admission: EM | Admit: 2016-04-20 | Discharge: 2016-04-20 | Disposition: A | Discharge status: Home / Self Care | Payer: Medicare Other | Attending: Emergency Medicine | Admitting: Emergency Medicine

## 2016-04-20 ENCOUNTER — Emergency Department (HOSPITAL_BASED_OUTPATIENT_CLINIC_OR_DEPARTMENT_OTHER): Payer: Medicare Other

## 2016-04-20 ENCOUNTER — Encounter (HOSPITAL_BASED_OUTPATIENT_CLINIC_OR_DEPARTMENT_OTHER): Payer: Self-pay | Admitting: Emergency Medicine

## 2016-04-20 DIAGNOSIS — I10 Essential (primary) hypertension: Secondary | ICD-10-CM | POA: Insufficient documentation

## 2016-04-20 DIAGNOSIS — E119 Type 2 diabetes mellitus without complications: Secondary | ICD-10-CM | POA: Insufficient documentation

## 2016-04-20 DIAGNOSIS — Z7984 Long term (current) use of oral hypoglycemic drugs: Secondary | ICD-10-CM | POA: Diagnosis not present

## 2016-04-20 DIAGNOSIS — Z87891 Personal history of nicotine dependence: Secondary | ICD-10-CM | POA: Insufficient documentation

## 2016-04-20 DIAGNOSIS — N3000 Acute cystitis without hematuria: Secondary | ICD-10-CM | POA: Insufficient documentation

## 2016-04-20 DIAGNOSIS — I471 Supraventricular tachycardia: Secondary | ICD-10-CM | POA: Diagnosis not present

## 2016-04-20 DIAGNOSIS — R3915 Urgency of urination: Secondary | ICD-10-CM | POA: Diagnosis present

## 2016-04-20 LAB — BASIC METABOLIC PANEL
Anion gap: 10 (ref 5–15)
BUN: 16 mg/dL (ref 6–20)
CALCIUM: 9.7 mg/dL (ref 8.9–10.3)
CO2: 26 mmol/L (ref 22–32)
CREATININE: 0.78 mg/dL (ref 0.44–1.00)
Chloride: 99 mmol/L — ABNORMAL LOW (ref 101–111)
GFR calc non Af Amer: >60 mL/min (ref >60)
Glucose, Bld: 112 mg/dL — ABNORMAL HIGH (ref 65–99)
Potassium: 3.7 mmol/L (ref 3.5–5.1)
SODIUM: 135 mmol/L (ref 135–145)

## 2016-04-20 LAB — URINALYSIS, ROUTINE W REFLEX MICROSCOPIC
BILIRUBIN URINE: NEGATIVE
GLUCOSE, UA: NEGATIVE mg/dL
Hgb urine dipstick: NEGATIVE
KETONES UR: NEGATIVE mg/dL
NITRITE: NEGATIVE
PROTEIN: NEGATIVE mg/dL
Specific Gravity, Urine: 1.005 (ref 1.005–1.030)
pH: 6.5 (ref 5.0–8.0)

## 2016-04-20 LAB — CBC
HCT: 41.5 % (ref 36.0–46.0)
Hemoglobin: 14.2 g/dL (ref 12.0–15.0)
MCH: 32 pg (ref 26.0–34.0)
MCHC: 34.2 g/dL (ref 30.0–36.0)
MCV: 93.5 fL (ref 78.0–100.0)
PLATELETS: 309 10*3/uL (ref 150–400)
RBC: 4.44 MIL/uL (ref 3.87–5.11)
RDW: 13.1 % (ref 11.5–15.5)
WBC: 9.7 10*3/uL (ref 4.0–10.5)

## 2016-04-20 LAB — TROPONIN I

## 2016-04-20 LAB — URINE MICROSCOPIC-ADD ON

## 2016-04-20 IMAGING — DX DG CHEST 2V
2 series · 2 of 2 positions shown · non-contrast
Comparison: 03/22/2011

CLINICAL DATA: Tachycardia.

EXAM:
CHEST  2 VIEW

[chest pa]
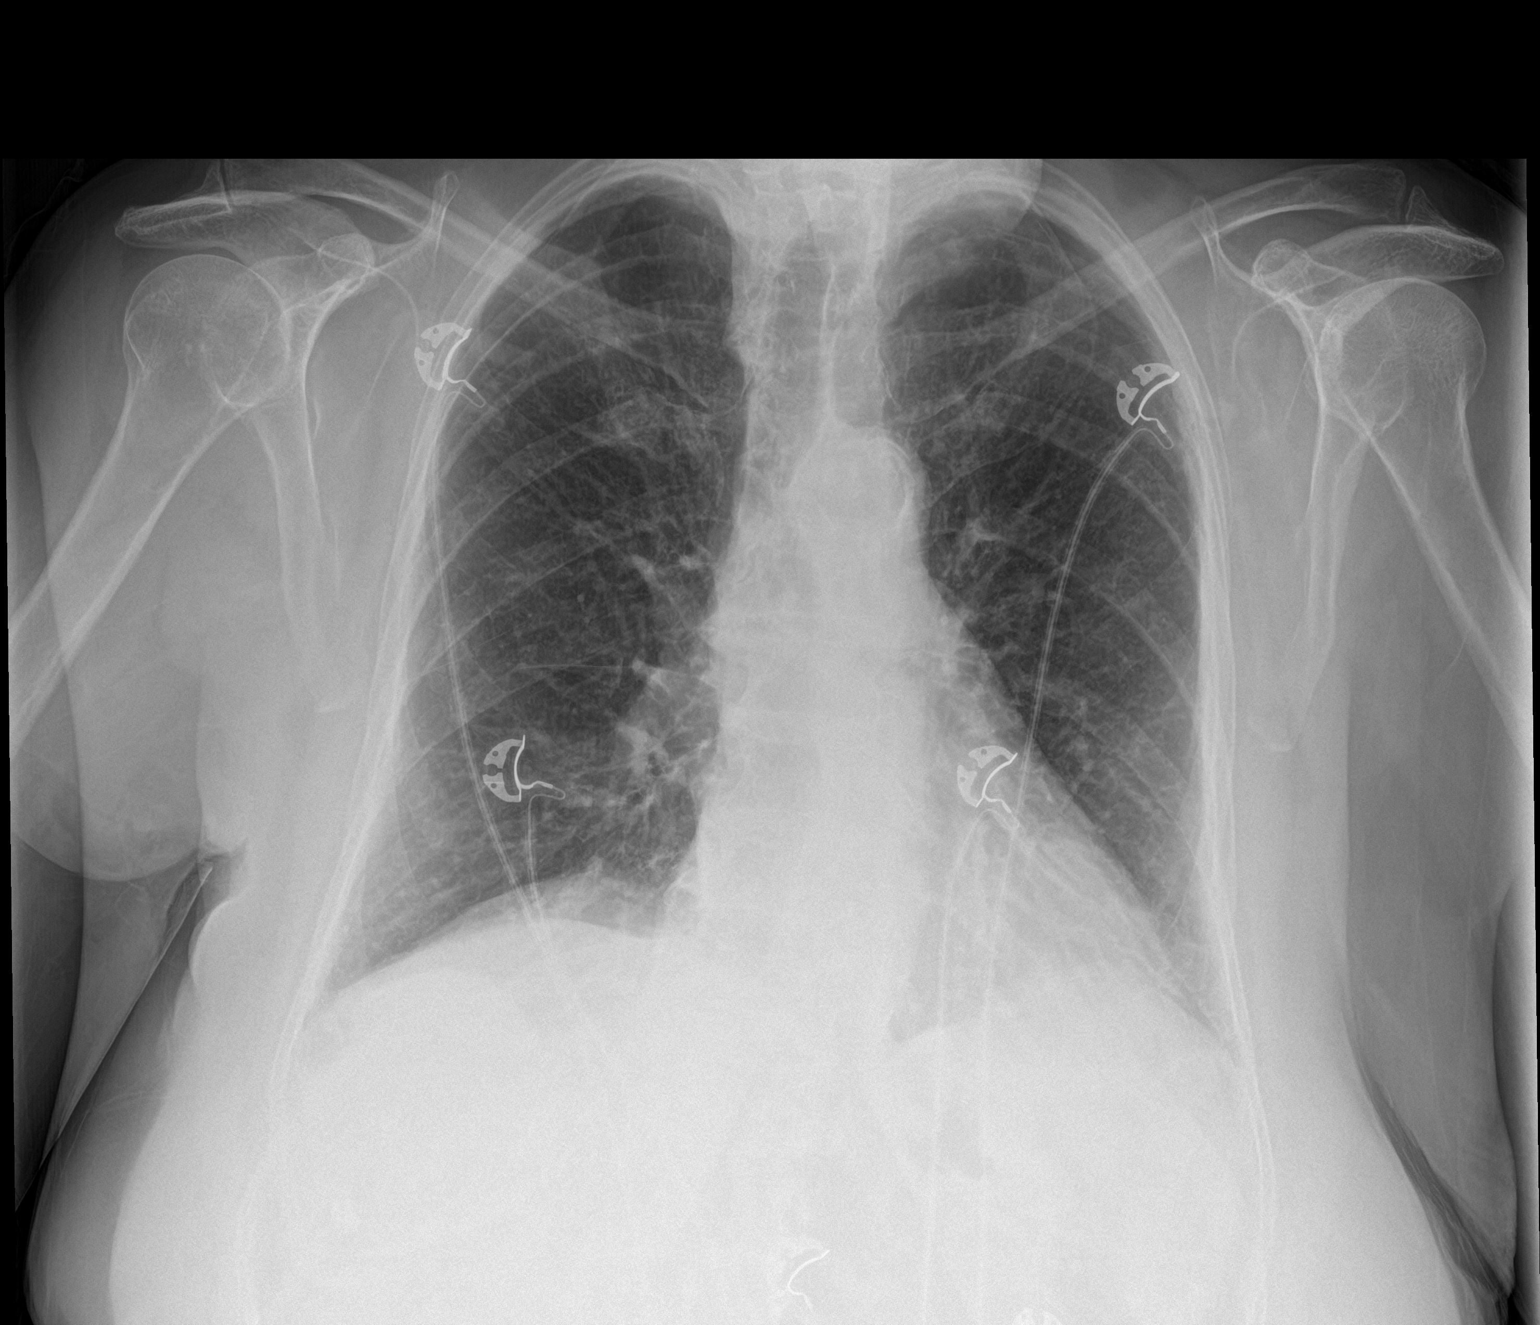

[chest lat]
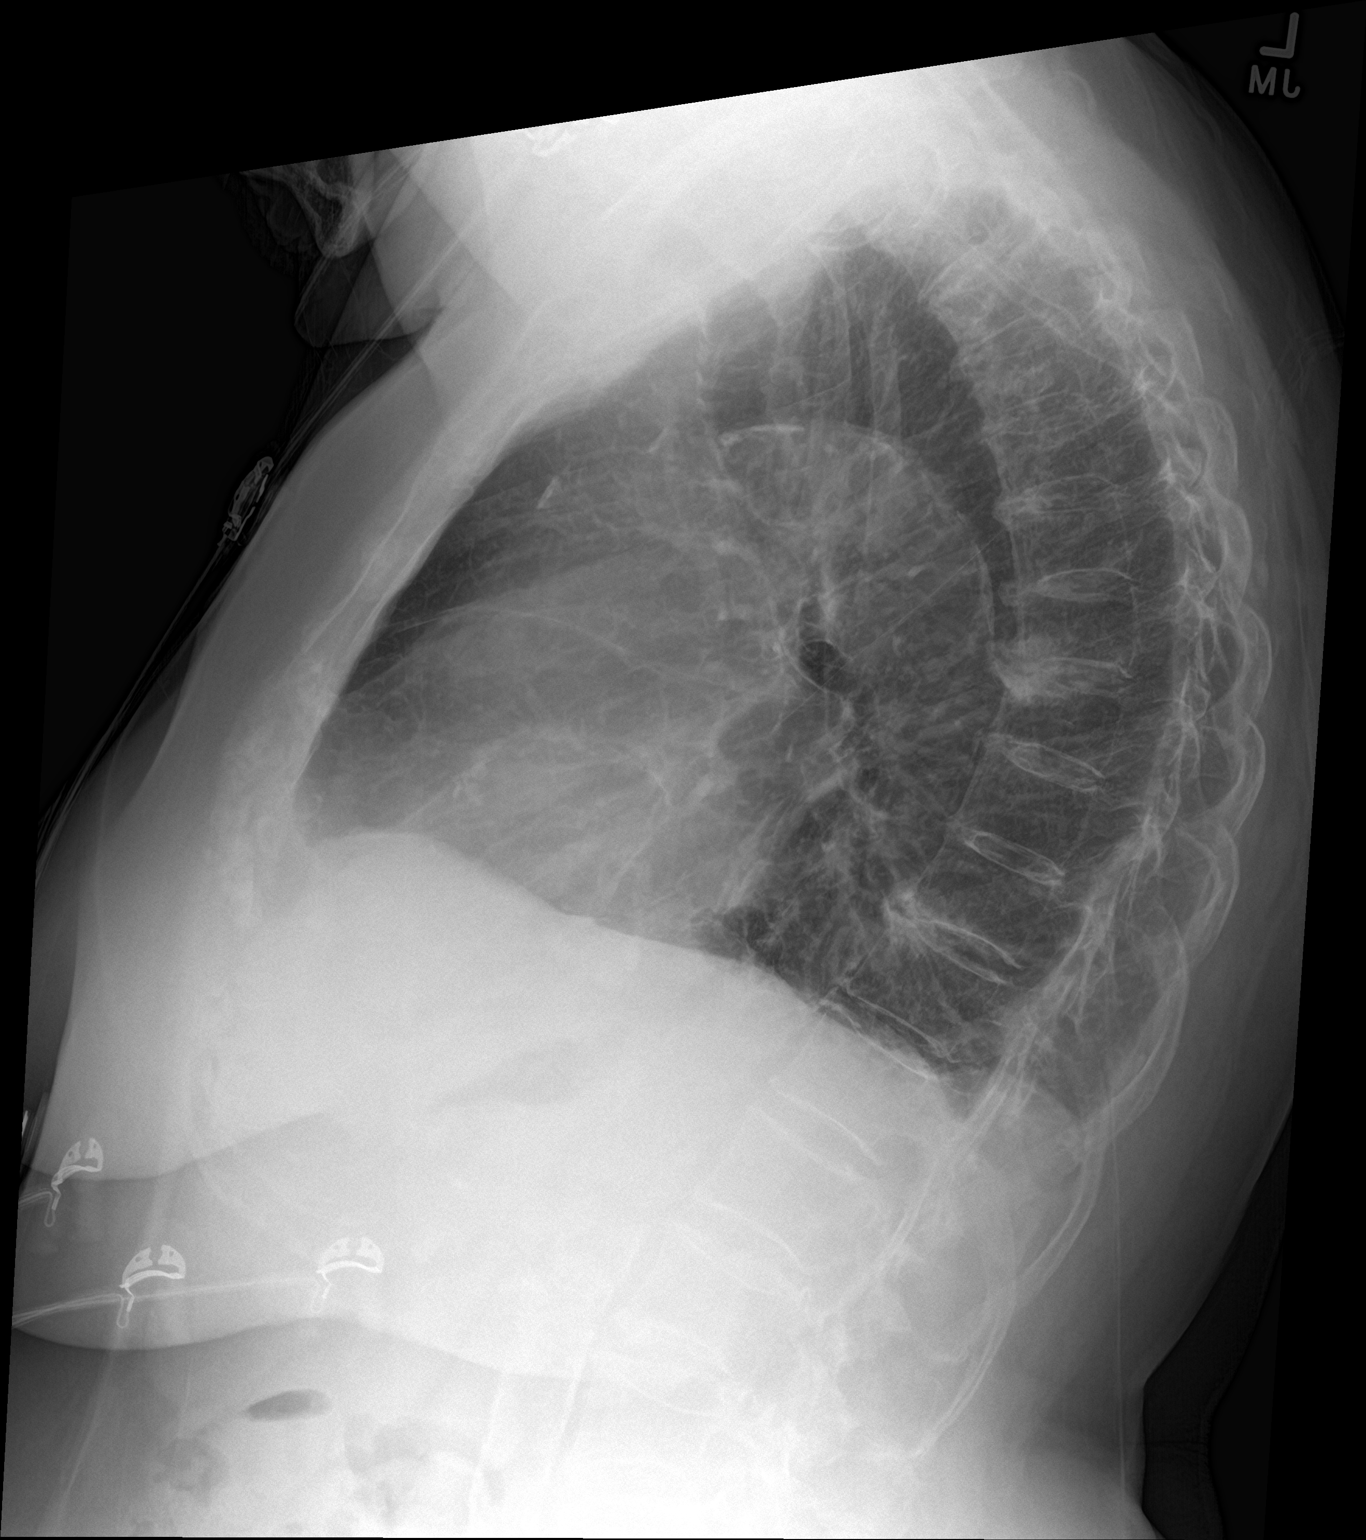

[2 of 2 positions shown; findings below may reference images not displayed]

FINDINGS: Heart size is normal. Mediastinal shadows show aortic
atherosclerosis. There is mild patchy density in the lingula not
seen previously. This is consistent with mild lingular pneumonia.
Remainder of the lungs are clear. Chronic degenerative changes
affect the spine. No pleural effusions.
IMPRESSION: Aortic atherosclerosis.

Patchy infiltrate in the lingula consistent with mild pneumonia.

## 2016-04-20 MED ORDER — SODIUM CHLORIDE 0.9 % IV BOLUS (SEPSIS)
500.0000 mL | Freq: Once | INTRAVENOUS | Status: AC
  Administered 2016-04-20: 500 mL via INTRAVENOUS

## 2016-04-20 MED ORDER — DILTIAZEM HCL ER COATED BEADS 120 MG PO CP24: 120.0000 mg | ORAL_CAPSULE | Freq: Every day | ORAL | 0 refills | Status: DC

## 2016-04-20 MED ORDER — LIDOCAINE HCL 2 % IJ SOLN
10.0000 mL | Freq: Once | INTRAMUSCULAR | Status: DC
Start: 2016-04-20 — End: 2016-04-20

## 2016-04-20 MED FILL — CARTIA XT 120 MG CAPSULE SA: 120 | 30 days supply | Qty: 30 | Fill #0

## 2016-04-20 NOTE — ED Triage Notes (Signed)
Pt in c/o urinary urgency but also found to be tachycardic. Denies cp, sob, dizziness or lightheadedness. Has had issues with tachycardia with dehydration in the past, r/t diuretic. Pt is alert, interactive, in NAD.

## 2016-04-20 NOTE — Progress Notes (Signed)
Called by Dr. Judd Lienelo regarding paroxysmal tachycardia. 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 She is on labetalol 200 mg BID. She reports that she had recurrence of this before but it went away when she was put on a diuretic. This may be because she is hypertensive and the diuretic unloaded the atrium. Regardless, she is now back in sinus and asymptomatic. Given her elevated BP, would recommend adding diltiazem LA 120 mg daily and restarting her diuretic. I'm happy to see in the office for follow-up. If she has recurrent symptoms, would refer to cardiac EP for evaluation of ablation.  Chrystie NoseKenneth C. Hilty, MD, [facility] Attending Cardiologist [facility]

## 2016-04-20 NOTE — ED Provider Notes (Signed)
[facility] Provider Note   CSN: [id number] Arrival date & time: 04/20/16  1505     History   Chief Complaint Chief Complaint  Patient presents with  . Tachycardia  . Urinary Urgency    HPI Tracey Porter is a 82 y.o. female.  Patient is a 82 year old female with past medical history of type 2 diabetes, hypertension. She presents for evaluation of rapid heartbeat. She reports urinary frequency over the past several days and stopped taking her diuretic due to this. She went to the doctor's office to have her urine checked and was found to have a rapid heartbeat. She was then sent here for evaluation. The patient has no symptoms. She denies any palpitations, chest pain, or shortness of breath. She denies any history of cardiac arrhythmia or coronary artery disease. She denies any aggravating or alleviating factors.   The history is provided by the patient.    Past Medical History:  Diagnosis Date  . Diabetes mellitus without complication (HCC)   . Hypertension     Patient Active Problem List   Diagnosis Date Noted  . Dehydration with hyponatremia 03/20/2015  . UTI (urinary tract infection) 03/20/2015  . Diarrhea 03/20/2015  . DM2 (diabetes mellitus, type 2) (HCC) 03/20/2015  . Hypokalemia 03/19/2015    Past Surgical History:  Procedure Laterality Date  . EYE SURGERY      OB History    No data available       Home Medications    Prior to Admission medications   Medication Sig Start Date End Date Taking? Authorizing Provider  ACYCLOVIR PO Take 800 mg by mouth 2 (two) times daily.     Historical Provider, MD  ALENDRONATE SODIUM PO Take 70 mg by mouth once a week.     Historical Provider, MD  ATORVASTATIN CALCIUM PO Take 40 mg by mouth every morning.     Historical Provider, MD  ciprofloxacin (CIPRO) 250 MG tablet Take 1 tablet (250 mg total) by mouth 2 (two) times daily. For 5days 03/20/15   Zannie CovePreetha Joseph, MD  LABETALOL HCL PO Take 200 mg by mouth  2 (two) times daily.     Historical Provider, MD  METFORMIN HCL PO Take 500 mg by mouth every morning.     Historical Provider, MD    Family History History reviewed. No pertinent family history.  Social History Social History  Substance Use Topics  . Smoking status: Former Games developermoker  . Smokeless tobacco: Not on file  . Alcohol use Yes     Allergies   Penicillins   Review of Systems Review of Systems  All other systems reviewed and are negative.    Physical Exam Updated Vital Signs BP (!) 177/116   Pulse 103   Temp 98.1 F (36.7 C)   Resp 21   Ht 5\' 3"  (1.6 m)   Wt 152 lb (68.9 kg)   SpO2 95%   BMI 26.93 kg/m   Physical Exam  Constitutional: She is oriented to person, place, and time. She appears well-developed and well-nourished. No distress.  HENT:  Head: Normocephalic and atraumatic.  Neck: Normal range of motion. Neck supple.  Cardiovascular: Exam reveals no gallop and no friction rub.   No murmur heard. Heart is initially irregularly irregular and rapid.  Pulmonary/Chest: Effort normal and breath sounds normal. No respiratory distress. She has no wheezes.  Abdominal: Soft. Bowel sounds are normal. She exhibits no distension. There is no tenderness.  Musculoskeletal: Normal range of motion. She exhibits  no edema.  Neurological: She is alert and oriented to person, place, and time.  Skin: Skin is warm and dry. She is not diaphoretic.  Nursing note and vitals reviewed.    ED Treatments / Results  Labs (all labs ordered are listed, but only abnormal results are displayed) Labs Reviewed  BASIC METABOLIC PANEL - Abnormal; Notable for the following:       Result Value   Chloride 99 (*)    Glucose, Bld 112 (*)    All other components within normal limits  CBC  TROPONIN I    EKG  EKG Interpretation  Date/Time:  Tuesday April 20 2016 15:14:52 EDT Ventricular Rate:  148 PR Interval:    QRS Duration: 86 QT Interval:  271 QTC Calculation: 426 R  Axis:   79 Text Interpretation:  Supraventricular tachycardia ST depression, probably rate related Confirmed by Laurelle Skiver  MD, Malaney Mcbean ([id number]) on 04/20/2016 3:21:41 PM       Radiology Dg Chest 2 View  Result Date: 04/20/2016 CLINICAL DATA:  Tachycardia. EXAM: CHEST  2 VIEW COMPARISON:  03/22/2011 FINDINGS: Heart size is normal. Mediastinal shadows show aortic atherosclerosis. There is mild patchy density in the lingula not seen previously. This is consistent with mild lingular pneumonia. Remainder of the lungs are clear. Chronic degenerative changes affect the spine. No pleural effusions. IMPRESSION: Aortic atherosclerosis. Patchy infiltrate in the lingula consistent with mild pneumonia. Electronically Signed   By: Paulina Fusi M.D.   On: 04/20/2016 15:44    Procedures Procedures (including critical care time)  Medications Ordered in ED Medications - No data to display   Initial Impression / Assessment and Plan / ED Course  I have reviewed the triage vital signs and the nursing notes.  Pertinent labs & imaging results that were available during my care of the patient were reviewed by me and considered in my medical decision making (see chart for details).  Clinical Course    Patient sent from PCPs office for evaluation of rapid heartbeat. The patient reports stopping her diuretic several days ago due to urinary frequency. She went to the office to be evaluated and was found to be in some form of tachycardia. The patient is asymptomatic with this and had no idea she was in it. Her initial EKG revealed what appeared to be A. fib with RVR, a flutter, or possibly some form of reentrant tachycardia. This seemed to resolve with vagal maneuvers. Shortly afterward, the arrhythmia returned and she has been back and forth several times.  Workup reveals no abnormality with troponin were laboratory studies. I have discussed this situation with Dr. Rennis Golden from cardiology who is recommending starting  Cardizem and having the patient follow-up in the office. The patient appears comfortable with this disposition. She will also be advised to resume taking her diuretic as before.  Her chest x-ray does reveal the possibility of an infiltrate in the lingula, however the patient is not complaining of any cough and this does not fit the clinical picture. She was prescribed Cipro by her primary doctor today and I will advise her to fill this prescription as well due to what appears to be a mild UTI.  Final Clinical Impressions(s) / ED Diagnoses   Final diagnoses:  None    New Prescriptions New Prescriptions   No medications on file     Geoffery Lyons, MD 04/20/16 1725

## 2016-04-20 NOTE — Discharge Instructions (Signed)
Begin taking Cardizem as prescribed today.  Resume taking your HCTZ as previously prescribed.  Fill the prescription for Cipro which was provided by your primary doctor.  Dr. Rennis GoldenHilty is recommending follow-up in his office in the next 3-4 days. His contact information has been provided for you to call and make these arrangements. Return to the emergency department in the meantime if your symptoms significantly worsen or change.

## 2016-04-20 NOTE — ED Notes (Signed)
Pt found to be in SVT again rate 150. Attempted conversion with blowing in syringe but attempt was unsuccessful. Dr. Judd Lienelo called to bedside, but pt spontaneously converted while waiting for him, heart rate 110.

## 2016-04-30 ENCOUNTER — Ambulatory Visit: Payer: Medicare Other | Admitting: Cardiovascular Disease

## 2016-04-30 ENCOUNTER — Encounter: Payer: Self-pay | Admitting: Cardiovascular Disease

## 2016-04-30 ENCOUNTER — Ambulatory Visit (INDEPENDENT_AMBULATORY_CARE_PROVIDER_SITE_OTHER): Payer: Medicare Other | Admitting: Cardiovascular Disease

## 2016-04-30 VITALS — BP 132/84 | HR 74 | Ht 66.0 in | Wt 184.2 lb

## 2016-04-30 DIAGNOSIS — I471 Supraventricular tachycardia, unspecified: Secondary | ICD-10-CM

## 2016-04-30 DIAGNOSIS — I10 Essential (primary) hypertension: Secondary | ICD-10-CM | POA: Diagnosis not present

## 2016-04-30 DIAGNOSIS — E785 Hyperlipidemia, unspecified: Secondary | ICD-10-CM

## 2016-04-30 DIAGNOSIS — R350 Frequency of micturition: Secondary | ICD-10-CM | POA: Diagnosis not present

## 2016-04-30 MED ORDER — DILTIAZEM HCL ER COATED BEADS 180 MG PO CP24: 180.0000 mg | ORAL_CAPSULE | Freq: Every day | ORAL | 3 refills | Status: DC

## 2016-04-30 NOTE — Progress Notes (Signed)
Cardiology Office Note    Date:  04/30/2016   ID:  Tracey Porter, DOB June 08, 1930, MRN [id number]  PCP:  No primary care provider on file.  Cardiologist:  Shelva Majestic, MD     History of Present Illness:  Tracey Porter is a 82 y.o. female who is referred for cardiology evaluation following an emergency room visit where she was found to be in SVT.  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  She has a history of hypertension, hyperlipidemia, and type 2 diabetes mellitus.  She had been taking metformin 500 mg daily with breakfast, leg, basal, alt to her milligrams twice a day, HCTZ 12.5 g daily, and atorvastatin 40 mg.  She has had issues with urinary urgency and was put on Mirabegron.  On 04/20/2016.  She also he went to [facility] with initial urinary complaints and was found to have SVT.  She was not seen by cardiologist.  However, Dr. Debara Pickett had spoken to the ER physician and it was recommended the addition of diltiazem LA 120 mg daily.  Patient was asymptomatic with reference to this tachycardia and a subsequent ECG showed restoration of normal sinus rhythm.  She was told that she had an appointment to be seen today, but was not on any schedule.  I worked her in and saw her as a new patient add-on for cardiology evaluation.   Past Medical History:  Diagnosis Date  . Diabetes mellitus without complication ([place])   . Hypertension     Past Surgical History:  Procedure Laterality Date  . ABDOMINAL HYSTERECTOMY  1976  . EYE SURGERY      Current Medications: Outpatient Medications Prior to Visit  Medication Sig Dispense Refill  . ACYCLOVIR PO Take 800 mg by mouth 2 (two) times daily.     . ALENDRONATE SODIUM PO Take 70 mg by mouth once a week.     . diltiazem (CARDIZEM CD) 120 MG 24 hr capsule Take 1 capsule (120 mg total) by mouth daily. 30 capsule 0  . ATORVASTATIN CALCIUM PO Take 40 mg by  mouth every morning.     . ciprofloxacin (CIPRO) 250 MG tablet Take 1 tablet (250 mg total) by mouth 2 (two) times daily. For 5days (Patient not taking: Reported on 04/30/2016) 10 tablet 0  . LABETALOL HCL PO Take 200 mg by mouth 2 (two) times daily.     [name] METFORMIN HCL PO Take 500 mg by mouth every morning.      No facility-administered medications prior to visit.      Allergies:   Penicillins   Social History   Social History  . Marital status: Widowed    Spouse name: N/A  . Number of children: N/A  . Years of education: N/A   Social History Main Topics  . Smoking status: Former Research scientist (life sciences)  . Smokeless tobacco: None  . Alcohol use Yes  . Drug use: No  . Sexual activity: Not Asked   Other Topics Concern  . None   Social History Narrative  . None    Additional social history is notable that she is from [place] [facility] [place] [facility] in [place].  She is widowed for 19 years.  She is 4 children and 6 grandchildren.  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  She had remotely smoked cigarettes for 10 years but quit in 1970.  She usually has one drink per day of  either wine or scotch.  She purchase patent water aerobics at least 3-4 days per week for an hour at a time.  Family History:  Her family history is notable in that her mother died at age 76 with liver problems and father died with lung cancer at 34.  She has one sister in her 68s.  Maternal grand mother died with a stroke.  Maternal grandfather died with typhoid fever.  She had 4 children, but unfortunately one died at age 34 in an accident.  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; No changes in hearing, sinus congestion, difficulty swallowing Pulmonary: Negative; No cough, wheezing, shortness of breath, hemoptysis Cardiovascular: See history of present illness GI: Negative; No nausea,  vomiting, diarrhea, or abdominal pain GU: Positive for urinary frequency Musculoskeletal: Negative; no myalgias, joint pain, or weakness Hematologic/Oncology: Negative; no easy bruising, bleeding Endocrine: Positive for diabetes mellitus Neuro: Negative; no changes in balance, headaches Skin: Negative; No rashes or skin lesions Psychiatric: Negative; No behavioral problems, depression Sleep: Negative; No snoring, daytime sleepiness, hypersomnolence, bruxism, restless legs, hypnogognic hallucinations, no cataplexy Other comprehensive 14 point system review is negative.   PHYSICAL EXAM:   VS:  BP 132/84 (BP Location: Left Arm, Patient Position: Sitting, Cuff Size: Normal)   Pulse 74   Ht '5\' 6"'  (1.676 m)   Wt 184 lb 4 oz (83.6 kg)   BMI 29.74 kg/m     Repeat blood pressure by me was 154/80  Wt Readings from Last 3 Encounters:  04/30/16 184 lb 4 oz (83.6 kg)  04/20/16 152 lb (68.9 kg)  03/20/15 172 lb 9.9 oz (78.3 kg)    General: Alert, oriented, no distress.  Skin: normal turgor, no rashes, warm and dry HEENT: Normocephalic, atraumatic. Pupils equal round and reactive to light; sclera anicteric; extraocular muscles intact; Fundi .  She is status post to right corneal transplants.  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  Right fundi was not well-visualized.  The left fundi did not reveal any hemorrhages or exudates. Nose without nasal septal hypertrophy Mouth/Parynx benign; Mallinpatti scale Neck: No JVD, no carotid bruits; normal carotid upstroke Lungs: clear to ausculatation and percussion; no wheezing or rales Chest wall: without tenderness to palpitation Heart: PMI not displaced, RRR, s1 s2 normal, faint 1/6 systolic murmur, no diastolic murmur, no rubs, gallops, thrills, or heaves Abdomen: soft, nontender; no hepatosplenomehaly, BS+; abdominal aorta nontender and not dilated by palpation. Back: no CVA tenderness Pulses 2+ Musculoskeletal: full range  of motion, normal strength, no joint deformities Extremities: no clubbing cyanosis or edema, Homan's sign negative  Neurologic: grossly nonfocal; Cranial nerves grossly wnl Psychologic: Normal mood and affect   Studies/Labs Reviewed:   ECG (independently read by me): Normal sinus rhythm at 75 bpm.  I personally reviewed the 2 ECGs from her emergency evaluation on 04/20/2016.   Initial ECG showed SVT at 148 with a PR interval at 107 ms and QTc interval at 426 ms.  Subsequent ECG showed sinus tachycardia 10 9 bpm with a PR interval at 187 ms and QTc interval 419 ms.  Recent Labs: BMP Latest Ref Rng & Units 04/20/2016 03/20/2015 03/19/2015  Glucose 65 - 99 mg/dL 112(H) 119(H) 134(H)  BUN 6 - 20 mg/dL 16 10 21(H)  Creatinine 0.44 - 1.00 mg/dL 0.78 0.82 0.82  Sodium 135 - 145 mmol/L 135  129(L) 125(L)  Potassium 3.5 - 5.1 mmol/L 3.7 3.5 2.8(L)  Chloride 101 - 111 mmol/L 99(L) 93(L) 85(L)  CO2 22 - 32 mmol/L '26 24 27  ' Calcium 8.9 - 10.3 mg/dL 9.7 7.1(L) 7.9(L)     Hepatic Function Latest Ref Rng & Units 03/19/2015  Total Protein 6.5 - 8.1 g/dL 6.9  Albumin 3.5 - 5.0 g/dL 3.3(L)  AST 15 - 41 U/L 18  ALT 14 - 54 U/L 16  Alk Phosphatase 38 - 126 U/L 65  Total Bilirubin 0.3 - 1.2 mg/dL 0.9    CBC Latest Ref Rng & Units 04/20/2016 03/20/2015 03/19/2015  WBC 4.0 - 10.5 K/uL 9.7 15.4(H) 15.1(H)  Hemoglobin 12.0 - 15.0 g/dL 14.2 11.1(L) 12.2  Hematocrit 36.0 - 46.0 % 41.5 32.5(L) 35.1(L)  Platelets 150 - 400 K/uL 309 277 294   Lab Results  Component Value Date   MCV 93.5 04/20/2016   MCV 90.3 03/20/2015   MCV 90.7 03/19/2015   No results found for: TSH No results found for: HGBA1C   BNP No results found for: BNP  ProBNP No results found for: PROBNP   Lipid Panel  No results found for: CHOL, TRIG, HDL, CHOLHDL, VLDL, LDLCALC, LDLDIRECT   RADIOLOGY: Dg Chest 2 View  Result Date: 04/20/2016 CLINICAL DATA:  Tachycardia. EXAM: CHEST  2 VIEW COMPARISON:  03/22/2011 FINDINGS:  Heart size is normal. Mediastinal shadows show aortic atherosclerosis. There is mild patchy density in the lingula not seen previously. This is consistent with mild lingular pneumonia. Remainder of the lungs are clear. Chronic degenerative changes affect the spine. No pleural effusions. IMPRESSION: Aortic atherosclerosis. Patchy infiltrate in the lingula consistent with mild pneumonia. Electronically Signed   By: Nelson Chimes M.D.   On: 04/20/2016 15:44     Additional studies/ records that were reviewed today include:  I personally reviewed her emergency room record from 04/20/2016.    ASSESSMENT:    1. Paroxysmal SVT (supraventricular tachycardia) ([place])   2. Essential hypertension   3. Hyperlipidemia, unspecified hyperlipidemia type   4. Urinary frequency      PLAN:  Ms. Funderburke is a very pleasant young appearing 82 year old Caucasian female who has a history of hypertension, hyperlipidemia, and type 2 diabetes mellitus.  She had been treated with sleep.  Nadolol and HCTZ for hypertension.  She does not have any signs of peripheral edema presently.  She went to the emergency room with urinary complaints and was found to have SVT for which she was completely asymptomatic.  During her SVT.  PR interval was short.  A subsequent ECG with restoration of sinus rhythm showed PR interval at 187.  She was started on diltiazem CD 120 mg.  She has had issues with urinary frequency.  There are no signs of edema.  I have recommended that she discontinue hydrochlorothiazide.  Since her blood pressure does have some lability, I have suggested that Cardizem CD increased to 180 mg daily.  This should provide additional blood pressure control as well as additional benefit regarding her SVT.  It may be possible that with the discontinuance of her diuretic.  She may not require mirabegron.  I have remote.  Recommended that she undergo a 2-D echo Doppler study.  I reviewed the blood work from her emergency room  evaluation.  I will recheck a CMP, lipid studies and will also check thyroid function studies.  I will see her in 2 months for follow-up cardiology evaluation.  Medication Adjustments/Labs and Tests Ordered: Current medicines  are reviewed at length with the patient today.  Concerns regarding medicines are outlined above.  Medication changes, Labs and Tests ordered today are listed in the Patient Instructions below. Patient Instructions  Your physician has recommended you make the following change in your medication:   1.) STOP HCTZ ( fluid pill)  2.) the diltiazem has been increased to 180 mg daily. STOP the 120 mg tablets.  Your physician recommends that you return for lab work FASTING.  Your physician recommends that you schedule a follow-up appointment in: 2 months.     Signed, Shelva Majestic, MD  04/30/2016 11:53 AM    [facility] [address] [place], [place], [place]  [address] Phone: [phone]

## 2016-04-30 NOTE — Patient Instructions (Addendum)
Your physician has recommended you make the following change in your medication:   1.) STOP HCTZ ( fluid pill)  2.) the diltiazem has been increased to 180 mg daily. STOP the 120 mg tablets.  Your physician recommends that you return for lab work FASTING.  Your physician recommends that you schedule a follow-up appointment in: 2 months.

## 2016-05-07 ENCOUNTER — Telehealth: Payer: Self-pay | Admitting: Cardiovascular Disease

## 2016-05-07 NOTE — Telephone Encounter (Signed)
Pt called and said her fax is not working,please mail her lab orders.

## 2016-05-07 NOTE — Telephone Encounter (Signed)
New Message  Pt voiced wanting to know if she can use the labs she previous had in August instead of having to go for additional labs.  Please f/u with pt

## 2016-05-07 NOTE — Telephone Encounter (Signed)
Pt Would like the order faxed over to her [phone] her personal fax machine

## 2016-05-07 NOTE — Telephone Encounter (Signed)
Will fax lab to pt informed that pt does not necessarily need them but she would like to have.

## 2016-05-07 NOTE — Telephone Encounter (Signed)
No labs entered at appointment please enter labs and fax to patient.

## 2016-05-07 NOTE — Telephone Encounter (Signed)
Spoke with pt informed that she needs to come in fasting sometime before her next appointment for new lab work. Put pt on hold and she hung up. Will fax over her lab paperwork to home fax.

## 2016-05-07 NOTE — Telephone Encounter (Signed)
Will mail lab orders

## 2016-05-19 ENCOUNTER — Ambulatory Visit (HOSPITAL_COMMUNITY): Payer: Medicare Other | Attending: Cardiology

## 2016-05-19 ENCOUNTER — Other Ambulatory Visit: Payer: Self-pay

## 2016-05-19 DIAGNOSIS — I471 Supraventricular tachycardia: Secondary | ICD-10-CM | POA: Diagnosis present

## 2016-05-19 DIAGNOSIS — I352 Nonrheumatic aortic (valve) stenosis with insufficiency: Secondary | ICD-10-CM | POA: Diagnosis not present

## 2016-05-19 DIAGNOSIS — I5030 Unspecified diastolic (congestive) heart failure: Secondary | ICD-10-CM | POA: Insufficient documentation

## 2016-05-19 DIAGNOSIS — I10 Essential (primary) hypertension: Secondary | ICD-10-CM | POA: Insufficient documentation

## 2016-06-23 ENCOUNTER — Ambulatory Visit (INDEPENDENT_AMBULATORY_CARE_PROVIDER_SITE_OTHER): Payer: Medicare Other | Admitting: Cardiovascular Disease

## 2016-06-23 ENCOUNTER — Encounter: Payer: Self-pay | Admitting: Cardiovascular Disease

## 2016-06-23 VITALS — BP 173/94 | HR 92 | Ht 66.0 in | Wt 184.4 lb

## 2016-06-23 DIAGNOSIS — E785 Hyperlipidemia, unspecified: Secondary | ICD-10-CM

## 2016-06-23 DIAGNOSIS — I471 Supraventricular tachycardia: Secondary | ICD-10-CM | POA: Diagnosis not present

## 2016-06-23 DIAGNOSIS — I10 Essential (primary) hypertension: Secondary | ICD-10-CM | POA: Diagnosis not present

## 2016-06-23 DIAGNOSIS — E119 Type 2 diabetes mellitus without complications: Secondary | ICD-10-CM

## 2016-06-23 MED ORDER — DILTIAZEM HCL ER COATED BEADS 240 MG PO CP24: 240.0000 mg | ORAL_CAPSULE | Freq: Every day | ORAL | 6 refills | Status: DC

## 2016-06-23 NOTE — Patient Instructions (Signed)
Your physician recommends that you schedule a follow-up appointment in: 4 months with Dr Tresa EndoKelly.  Your physician has recommended you make the following change in your medication:   1.) the cardizem has been increased to 240 mg daily

## 2016-06-25 NOTE — Progress Notes (Signed)
Cardiology Office Note    Date:  06/25/2016   ID:  Tracey Porter, DOB 1929-09-08, MRN [id number]  PCP:  No primary care provider on file.  Cardiologist:  Shelva Majestic, MD     History of Present Illness:  Tracey Porter is a 82 y.o. female who was initially referred for cardiology evaluation following an emergency room visit where she was found to be in SVT.She presents now for a 2 month follow-up cardiology evaluation.  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  She has a history of hypertension, hyperlipidemia, and type 2 diabetes mellitus.  She had been taking metformin 500 mg daily with breakfast, leg, basal, alt to her milligrams twice a day, HCTZ 12.5 g daily, and atorvastatin 40 mg.  She has had issues with urinary urgency and was put on Mirabegron.  On 04/20/2016.  She also he went to [facility] with initial urinary complaints and was found to have SVT.  She was not seen by cardiologist.  However, Dr. Debara Pickett had spoken to the ER physician and it was recommended the addition of diltiazem LA 120 mg daily.  Patient was asymptomatic with reference to this tachycardia and a subsequent ECG showed restoration of normal sinus rhythm.  When I saw her, I further titrated diltiazem from 120 mg to 180 mg and recommended discontinuance of hydrochlorothiazide.  She underwent a 2-D echo Doppler study on 05/19/2016 which showed an EF of 65-70%.  She had normal wall motion.  There was grade 2 diastolic dysfunction.  There was mitral annular calcification with mild to moderate MR.  He was mild aortic valve stenosis with a peak gradient of 20 and mean gradient of 12 mmHg.  She had severe LA dilatation, moderate TR, and mild/moderate pulmonary hypertension with PA pressure 44 mm.  The last several months.  She denies any recurrent episodes of SVT.  She denies PND or orthopnea.  She denies difficulty with sleep.  She  presents for evaluation.  Past Medical History:  Diagnosis Date  . Diabetes mellitus without complication (Tracey Porter)   . Hypertension     Past Surgical History:  Procedure Laterality Date  . ABDOMINAL HYSTERECTOMY  1976  . EYE SURGERY      Current Medications: Outpatient Medications Prior to Visit  Medication Sig Dispense Refill  . ACYCLOVIR PO Take 800 mg by mouth 2 (two) times daily.     . ALENDRONATE SODIUM PO Take 70 mg by mouth once a week.     [name] atorvastatin (LIPITOR) 40 MG tablet Take 40 mg by mouth daily.    [name] labetalol (NORMODYNE) 200 MG tablet Take 200 mg by mouth 2 (two) times daily.    . metFORMIN (GLUCOPHAGE) 500 MG tablet Take 500 mg by mouth daily with breakfast.    . diltiazem (CARDIZEM CD) 180 MG 24 hr capsule Take 1 capsule (180 mg total) by mouth daily. 90 capsule 3  . hydrochlorothiazide (HYDRODIURIL) 12.5 MG tablet Take 1 tablet by mouth daily.    . mirabegron ER (MYRBETRIQ) 25 MG TB24 tablet Take 25 mg by mouth daily.     No facility-administered medications prior to visit.      Allergies:   Penicillins   Social History   Social History  . Marital status: Widowed    Spouse name: N/A  . Number of children: N/A  . Years of education: N/A   Social History Main Topics  .  Smoking status: Former Research scientist (life sciences)  . Smokeless tobacco: None  . Alcohol use Yes  . Drug use: No  . Sexual activity: Not Asked   Other Topics Concern  . None   Social History Narrative  . None    Additional social history is notable that she is from [facility] in [place].  She is widowed for 19 years.  She is 4 children and 6 grandchildren.  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  She had remotely smoked cigarettes for 10 years but quit in 1970.  She usually has one drink per day of either wine or scotch.  She purchase patent water aerobics at least 3-4 days per week for an hour at a time.  Family History:  Her family history is notable in that her mother died at age 79  with liver problems and father died with lung cancer at 85.  She has one sister in her 63s.  Maternal grand mother died with a stroke.  Maternal grandfather died with typhoid fever.  She had 4 children, but unfortunately one died at age 29 in an accident.  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; No changes in hearing, sinus congestion, difficulty swallowing Pulmonary: Negative; No cough, wheezing, shortness of breath, hemoptysis Cardiovascular: See history of present illness GI: Negative; No nausea, vomiting, diarrhea, or abdominal pain GU: Positive for urinary frequency Musculoskeletal: Negative; no myalgias, joint pain, or weakness Hematologic/Oncology: Negative; no easy bruising, bleeding Endocrine: Positive for diabetes mellitus Neuro: Negative; no changes in balance, headaches Skin: Negative; No rashes or skin lesions Psychiatric: Negative; No behavioral problems, depression Sleep: Negative; No snoring, daytime sleepiness, hypersomnolence, bruxism, restless legs, hypnogognic hallucinations, no cataplexy Other comprehensive 14 point system review is negative.   PHYSICAL EXAM:   VS:  BP (!) 173/94   Pulse 92   Ht '5\' 6"'  (1.676 m)   Wt 184 lb 6.4 oz (83.6 kg)   BMI 29.76 kg/m      Wt Readings from Last 3 Encounters:  06/23/16 184 lb 6.4 oz (83.6 kg)  04/30/16 184 lb 4 oz (83.6 kg)  04/20/16 152 lb (68.9 kg)    General: Alert, oriented, no distress.  Skin: normal turgor, no rashes, warm and dry HEENT: Normocephalic, atraumatic. Pupils equal round and reactive to light; sclera anicteric; extraocular muscles intact; Fundi .  She is status post to right corneal transplants.  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  Right fundi was not well-visualized.  The left fundi did not reveal any hemorrhages or exudates. Nose  without nasal septal hypertrophy Mouth/Parynx benign; Mallinpatti scale Neck: No JVD, no carotid bruits; normal carotid upstroke Lungs: clear to ausculatation and percussion; no wheezing or rales Chest wall: without tenderness to palpitation Heart: PMI not displaced, RRR, s1 s2 normal, 1/6 systolic murmur, no diastolic murmur, no rubs, gallops, thrills, or heaves Abdomen: soft, nontender; no hepatosplenomehaly, BS+; abdominal aorta nontender and not dilated by palpation. Back: no CVA tenderness Pulses 2+ Musculoskeletal: full range of motion, normal strength, no joint deformities Extremities: no clubbing cyanosis or edema, Homan's sign negative  Neurologic: grossly nonfocal; Cranial nerves grossly wnl Psychologic: Normal mood and affect   Studies/Labs Reviewed:   ECG (independently read by me): Normal sinus rhythm at 92 bpm.  PR interval 190 ms.  QTc interval 427 ms.  04/30/2016 ECG (independently  read by me): Normal sinus rhythm at 75 bpm.  I personally reviewed the 2 ECGs from her emergency evaluation on 04/20/2016.   Initial ECG showed SVT at 148 with a PR interval at 107 ms and QTc interval at 426 ms.  Subsequent ECG showed sinus tachycardia 109 bpm with a PR interval at 187 ms and QTc interval 419 ms.  Recent Labs: BMP Latest Ref Rng & Units 04/20/2016 03/20/2015 03/19/2015  Glucose 65 - 99 mg/dL 112(H) 119(H) 134(H)  BUN 6 - 20 mg/dL 16 10 21(H)  Creatinine 0.44 - 1.00 mg/dL 0.78 0.82 0.82  Sodium 135 - 145 mmol/L 135 129(L) 125(L)  Potassium 3.5 - 5.1 mmol/L 3.7 3.5 2.8(L)  Chloride 101 - 111 mmol/L 99(L) 93(L) 85(L)  CO2 22 - 32 mmol/L '26 24 27  ' Calcium 8.9 - 10.3 mg/dL 9.7 7.1(L) 7.9(L)     Hepatic Function Latest Ref Rng & Units 03/19/2015  Total Protein 6.5 - 8.1 g/dL 6.9  Albumin 3.5 - 5.0 g/dL 3.3(L)  AST 15 - 41 U/L 18  ALT 14 - 54 U/L 16  Alk Phosphatase 38 - 126 U/L 65  Total Bilirubin 0.3 - 1.2 mg/dL 0.9    CBC Latest Ref Rng & Units 04/20/2016 03/20/2015  03/19/2015  WBC 4.0 - 10.5 K/uL 9.7 15.4(H) 15.1(H)  Hemoglobin 12.0 - 15.0 g/dL 14.2 11.1(L) 12.2  Hematocrit 36.0 - 46.0 % 41.5 32.5(L) 35.1(L)  Platelets 150 - 400 K/uL 309 277 294   Lab Results  Component Value Date   MCV 93.5 04/20/2016   MCV 90.3 03/20/2015   MCV 90.7 03/19/2015   No results found for: TSH No results found for: HGBA1C   BNP No results found for: BNP  ProBNP No results found for: PROBNP   Lipid Panel  No results found for: CHOL, TRIG, HDL, CHOLHDL, VLDL, LDLCALC, LDLDIRECT   RADIOLOGY: No results found.   Additional studies/ records that were reviewed today include:  I personally reviewed her emergency room records from 04/20/2016.    ASSESSMENT:    1. Essential hypertension   2. Paroxysmal SVT (supraventricular tachycardia) ([place])   3. Type 2 diabetes mellitus without complication, without long-term current use of insulin [facility])      PLAN:  Ms. Dorman is a very pleasant young appearing 82 year old Caucasian female who has a history of hypertension, hyperlipidemia, and type 2 diabetes mellitus.  She had been treated with sleep.  Nadolol and HCTZ for hypertension.  She does not have any signs of peripheral edema presently.  She went to the emergency room with urinary complaints and was found to have SVT for which she was completely asymptomatic.  During her SVT the PR interval was short.  A subsequent ECG with restoration of sinus rhythm showed PR interval at 187.  She was started on diltiazem CD 120 mg and this was increased at her initial office visit to 180 mg.  Her blood pressure today is elevated and her ventricular rate is 92.  I have suggested further titration of Cardizem CD to 240 mg daily.  She continues to take labetalol 20 mg twice a day and if she continues to be hypertensive further dose escalation may be necessary.  She is on metformin for type 2 diabetes mellitus..  She is on atorvastatin 40 mg for hyperlipidemia.  She is tolerating this  well without myalgias.  I will see her in 4 months for follow-up evaluation.  Medication Adjustments/Labs and Tests Ordered: Current medicines are reviewed at length  with the patient today.  Concerns regarding medicines are outlined above.  Medication changes, Labs and Tests ordered today are listed in the Patient Instructions below.  Patient Instructions  Your physician recommends that you schedule a follow-up appointment in: 4 months with Dr Claiborne Billings.  Your physician has recommended you make the following change in your medication:   1.) the cardizem has been increased to 240 mg daily      Signed, Shelva Majestic, MD  06/25/2016 6:00 PM    [facility] [address] [place], [place], [place]  [address] Phone: [phone]

## 2016-08-10 ENCOUNTER — Other Ambulatory Visit: Payer: Self-pay

## 2016-08-10 MED ORDER — DILTIAZEM HCL ER COATED BEADS 240 MG PO CP24: 240.0000 mg | ORAL_CAPSULE | Freq: Every day | ORAL | 3 refills | Status: DC

## 2016-09-07 ENCOUNTER — Telehealth: Payer: Self-pay | Admitting: Cardiovascular Disease

## 2016-09-07 ENCOUNTER — Other Ambulatory Visit: Payer: Self-pay | Admitting: *Deleted

## 2016-09-07 DIAGNOSIS — Z794 Long term (current) use of insulin: Secondary | ICD-10-CM

## 2016-09-07 DIAGNOSIS — E119 Type 2 diabetes mellitus without complications: Secondary | ICD-10-CM

## 2016-09-07 DIAGNOSIS — I471 Supraventricular tachycardia: Secondary | ICD-10-CM

## 2016-09-07 DIAGNOSIS — E785 Hyperlipidemia, unspecified: Secondary | ICD-10-CM

## 2016-09-07 DIAGNOSIS — I10 Essential (primary) hypertension: Secondary | ICD-10-CM

## 2016-09-07 NOTE — Telephone Encounter (Signed)
New message    Pt is calling saying she wants to let Dr. Tresa EndoKelly know she has high cholesterol. She states she is on medication. She is asking if she needs to have blood work for this done before she comes in for appt in April so he can go over it?

## 2016-09-07 NOTE — Telephone Encounter (Signed)
Forwarded for any recommended labwork

## 2016-09-07 NOTE — Telephone Encounter (Signed)
New Message   Pt states she has had no TSH or cholesterol so she has to have those done. States everything else has been done and requested to let you know. Does not request a call back unless further questions.

## 2016-09-07 NOTE — Telephone Encounter (Signed)
Returned a call to patient informing her that Dr Tresa EndoKelly will want her to have labs. She states that she recently had labs drawn by her PCP and was started on some atorvastatin about 2-3 days ago. I asked her what other labs was drawn and she was unable to tell me. She says that she will check and have to call me back with that information.

## 2016-09-09 ENCOUNTER — Other Ambulatory Visit: Payer: Self-pay | Admitting: *Deleted

## 2016-09-09 DIAGNOSIS — E785 Hyperlipidemia, unspecified: Secondary | ICD-10-CM

## 2016-09-09 DIAGNOSIS — I10 Essential (primary) hypertension: Secondary | ICD-10-CM

## 2016-09-09 DIAGNOSIS — Z79899 Other long term (current) drug therapy: Secondary | ICD-10-CM

## 2016-10-22 ENCOUNTER — Ambulatory Visit (INDEPENDENT_AMBULATORY_CARE_PROVIDER_SITE_OTHER): Payer: Medicare Other | Admitting: Cardiovascular Disease

## 2016-10-22 ENCOUNTER — Encounter: Payer: Self-pay | Admitting: Cardiovascular Disease

## 2016-10-22 VITALS — BP 134/86 | HR 86 | Ht 66.5 in | Wt 186.0 lb

## 2016-10-22 DIAGNOSIS — I471 Supraventricular tachycardia: Secondary | ICD-10-CM | POA: Diagnosis not present

## 2016-10-22 DIAGNOSIS — E119 Type 2 diabetes mellitus without complications: Secondary | ICD-10-CM | POA: Diagnosis not present

## 2016-10-22 DIAGNOSIS — E785 Hyperlipidemia, unspecified: Secondary | ICD-10-CM

## 2016-10-22 DIAGNOSIS — I10 Essential (primary) hypertension: Secondary | ICD-10-CM | POA: Diagnosis not present

## 2016-10-22 MED ORDER — LABETALOL HCL 200 MG PO TABS: ORAL_TABLET | ORAL | 3 refills | Status: DC

## 2016-10-22 NOTE — Progress Notes (Signed)
Cardiology Office Note    Date:  10/22/2016   ID:  Tracey Porter, DOB [birth year], MRN [id number]  PCP:  No primary care provider on file.  Cardiologist:  Shelva Majestic, MD     History of Present Illness:  Tracey Porter is a 83 y.o. female who was initially referred for cardiology evaluation following an emergency room visit where she was found to be in SVT. She presents now for a 2 month follow-up cardiology evaluation.  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  She has a history of hypertension, hyperlipidemia, and type 2 diabetes mellitus.  She had been taking metformin 500 mg daily with breakfast, leg, basal, alt to her milligrams twice a day, HCTZ 12.5 g daily, and atorvastatin 40 mg.  She has had issues with urinary urgency and was put on Mirabegron.  On 04/20/2016.  She also he went to [facility] with initial urinary complaints and was found to have SVT.  She was not seen by cardiologist.  However, Dr. Debara Pickett had spoken to the ER physician and it was recommended the addition of diltiazem LA 120 mg daily.  Patient was asymptomatic with reference to this tachycardia and a subsequent ECG showed restoration of normal sinus rhythm.  When I saw her, I further titrated diltiazem from 120 mg to 180 mg and recommended discontinuance of hydrochlorothiazide.  She underwent a 2-D echo Doppler study on 05/19/2016 which showed an EF of 65-70%.  She had normal wall motion.  There was grade 2 diastolic dysfunction.  There was mitral annular calcification with mild to moderate MR.  There was mild aortic valve stenosis with a peak gradient of 20 and mean gradient of 12 mmHg.  She had severe LA dilatation, moderate TR, and mild/moderate pulmonary hypertension with PA pressure 44 mm.  When  I last saw her in December 2017, she was hypertensive and I further titrated Cardizem CD to 240 mg daily.  She has continued to  take labetalol 200 mg twice a day.  Does continue to take atorvastatin 40 mg for hyperlipidemia and is on metformin for type 2 diabetes mellitus.  On 10/19/2016 repeat laboratory revealed a total cholesterol of 191, triglycerides 83, HDL 88, and LDL cholesterol 86.  She denies any recurrent episodes of SVT.  She denies PND or orthopnea.  She denies difficulty with sleep.  She presents for evaluation.  Past Medical History:  Diagnosis Date  . Diabetes mellitus without complication ([place])   . Hypertension     Past Surgical History:  Procedure Laterality Date  . ABDOMINAL HYSTERECTOMY  1976  . EYE SURGERY      Current Medications: Outpatient Medications Prior to Visit  Medication Sig Dispense Refill  . ACYCLOVIR PO Take 800 mg by mouth 2 (two) times daily.     . ALENDRONATE SODIUM PO Take 70 mg by mouth once a week.     [name] atorvastatin (LIPITOR) 40 MG tablet Take 40 mg by mouth daily.    [name] diltiazem (CARDIZEM CD) 240 MG 24 hr capsule Take 1 capsule (240 mg total) by mouth daily. 90 capsule 3  . labetalol (NORMODYNE) 200 MG tablet Take 200 mg by mouth 2 (two) times daily.    . metFORMIN (GLUCOPHAGE) 500 MG tablet Take 500 mg by mouth daily with breakfast.     No facility-administered medications prior to visit.      Allergies:   Penicillins  Social History   Social History  . Marital status: Widowed    Spouse name: N/A  . Number of children: N/A  . Years of education: N/A   Social History Main Topics  . Smoking status: Former Research scientist (life sciences)  . Smokeless tobacco: Never Used  . Alcohol use Yes  . Drug use: No  . Sexual activity: Not Asked   Other Topics Concern  . None   Social History Narrative  . None    Additional social history is notable that she is from [facility] in [place].  She is widowed for 19 years.  She is 4 children and 6 grandchildren.  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  She had remotely smoked cigarettes for 10 years but quit in 1970.  She  usually has one drink per day of either wine or scotch.  She purchase patent water aerobics at least 3-4 days per week for an hour at a time.  Family History:  Her family history is notable in that her mother died at age 82 with liver problems and father died with lung cancer at 31.  She has one sister in her 57s.  Maternal grand mother died with a stroke.  Maternal grandfather died with typhoid fever.  She had 4 children, but unfortunately one died at age 83 in an accident.  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; No changes in hearing, sinus congestion, difficulty swallowing Pulmonary: Negative; No cough, wheezing, shortness of breath, hemoptysis Cardiovascular: See history of present illness GI: Negative; No nausea, vomiting, diarrhea, or abdominal pain GU: Positive for urinary frequency Musculoskeletal: Negative; no myalgias, joint pain, or weakness Hematologic/Oncology: Negative; no easy bruising, bleeding Endocrine: Positive for diabetes mellitus Neuro: Negative; no changes in balance, headaches Skin: Negative; No rashes or skin lesions Psychiatric: Negative; No behavioral problems, depression Sleep: Negative; No snoring, daytime sleepiness, hypersomnolence, bruxism, restless legs, hypnogognic hallucinations, no cataplexy Other comprehensive 14 point system review is negative.   PHYSICAL EXAM:   VS:  BP 134/86   Pulse 86   Ht 5' 6.5" (1.689 m)   Wt 186 lb (84.4 kg)   BMI 29.57 kg/m     Repeat blood pressure by me was 158/86.  Wt Readings from Last 3 Encounters:  10/22/16 186 lb (84.4 kg)  06/23/16 184 lb 6.4 oz (83.6 kg)  04/30/16 184 lb 4 oz (83.6 kg)    General: Alert, oriented, no distress.  Skin: normal turgor, no rashes, warm and dry HEENT: Normocephalic, atraumatic. Pupils equal round and reactive to light; sclera anicteric;  extraocular muscles intact; Fundi .  She is status post  right corneal transplants.   Right fundi was not well-visualized.  The left fundi did not reveal any hemorrhages or exudates. Nose without nasal septal hypertrophy Mouth/Parynx benign; Mallinpatti scale 3 Neck: No JVD, no carotid bruits; normal carotid upstroke Lungs: clear to ausculatation and percussion; no wheezing or rales Chest wall: without tenderness to palpitation Heart: PMI not displaced, RRR, s1 s2 normal, 1/6 systolic murmur, no diastolic murmur, no rubs, gallops, thrills, or heaves Abdomen: soft, nontender; no hepatosplenomehaly, BS+; abdominal aorta nontender and not dilated by palpation. Back: no CVA tenderness Pulses 2+ Musculoskeletal: full range of motion, normal strength, no joint deformities Extremities: no clubbing cyanosis or edema, Homan's sign negative  Neurologic: grossly nonfocal; Cranial nerves grossly wnl Psychologic: Normal mood and affect  Studies/Labs Reviewed:    ECG (independently read by me): Sinus rhythm at 87 bpm.  Normal intervals.  No significant ST changes.  06/25/2016 ECG (independently read by me): Normal sinus rhythm at 92 bpm.  PR interval 190 ms.  QTc interval 427 ms.  04/30/2016 ECG (independently read by me): Normal sinus rhythm at 75 bpm.  I personally reviewed the 2 ECGs from her emergency evaluation on 04/20/2016.   Initial ECG showed SVT at 148 with a PR interval at 107 ms and QTc interval at 426 ms.  Subsequent ECG showed sinus tachycardia 109 bpm with a PR interval at 187 ms and QTc interval 419 ms.  Recent Labs: BMP Latest Ref Rng & Units 04/20/2016 03/20/2015 03/19/2015  Glucose 65 - 99 mg/dL 112(H) 119(H) 134(H)  BUN 6 - 20 mg/dL 16 10 21(H)  Creatinine 0.44 - 1.00 mg/dL 0.78 0.82 0.82  Sodium 135 - 145 mmol/L 135 129(L) 125(L)  Potassium 3.5 - 5.1 mmol/L 3.7 3.5 2.8(L)  Chloride 101 - 111 mmol/L 99(L) 93(L) 85(L)  CO2 22 - 32 mmol/L '26 24 27  ' Calcium 8.9 - 10.3 mg/dL 9.7  7.1(L) 7.9(L)     Hepatic Function Latest Ref Rng & Units 03/19/2015  Total Protein 6.5 - 8.1 g/dL 6.9  Albumin 3.5 - 5.0 g/dL 3.3(L)  AST 15 - 41 U/L 18  ALT 14 - 54 U/L 16  Alk Phosphatase 38 - 126 U/L 65  Total Bilirubin 0.3 - 1.2 mg/dL 0.9    CBC Latest Ref Rng & Units 04/20/2016 03/20/2015 03/19/2015  WBC 4.0 - 10.5 K/uL 9.7 15.4(H) 15.1(H)  Hemoglobin 12.0 - 15.0 g/dL 14.2 11.1(L) 12.2  Hematocrit 36.0 - 46.0 % 41.5 32.5(L) 35.1(L)  Platelets 150 - 400 K/uL 309 277 294   Lab Results  Component Value Date   MCV 93.5 04/20/2016   MCV 90.3 03/20/2015   MCV 90.7 03/19/2015   No results found for: TSH No results found for: HGBA1C   BNP No results found for: BNP  ProBNP No results found for: PROBNP   Lipid Panel  No results found for: CHOL, TRIG, HDL, CHOLHDL, VLDL, LDLCALC, LDLDIRECT   RADIOLOGY: No results found.   Additional studies/ records that were reviewed today include:  I personally reviewed her emergency room records from 04/20/2016, and repeat laboratory.    ASSESSMENT:    No diagnosis found.   PLAN:  Tracey Porter is a very pleasant young appearing 83 year old Caucasian female who has a history of hypertension, hyperlipidemia, and type 2 diabetes mellitus.  She went to the emergency room with urinary complaints and was found to have SVT for which she was completely asymptomatic.  During her SVT the PR interval was short.  A subsequent ECG with restoration of sinus rhythm showed PR interval at 187.  She was started on diltiazem CD 120 mg and this was increased at her initial office visit to 180 mg.  When last seen, her blood pressure was still increased and her dose was further titrated to 240 mg daily.  Pressure when taken by the nurse today was stable at 134/86.  BP recheckede by me was increased at 158/86.  I have recommended slight titration of labetalol, such that she will take 300 mg in the morning but she will continue 200 mg at night.  I reviewed her  most recent laboratory.  She is on atorvastatin 40 mg.  Her most recent LDL cholesterol was 86.  We will monitor this closely, but With her  diabetes mellitus, target LDL is less than 70.  She may need further dose titration to 80 mg were Zetia 10 mg be added to her medical regimen.  He denies any recurrent episodes of SVT or tachycardia palpitations.  I will see her in 4 months for reevaluation.   Medication Adjustments/Labs and Tests Ordered: Current medicines are reviewed at length with the patient today.  Concerns regarding medicines are outlined above.  Medication changes, Labs and Tests ordered today are listed in the Patient Instructions below.  There are no Patient Instructions on file for this visit.   Signed, Shelva Majestic, MD  10/22/2016 3:40 PM    [facility] [address], [place], [place], [place]  [address] Phone: [phone]

## 2016-10-22 NOTE — Patient Instructions (Signed)
Dr Tresa Endo has recommended making the following medication changes: 1. INCREASE Labetalol to 300 mg in the morning and continue 200 mg in the evening  Your physician recommends that you schedule a follow-up appointment in 4 months with Dr Tresa Endo.  If you need a refill on your cardiac medications before your next appointment, please call your pharmacy.

## 2017-01-05 ENCOUNTER — Ambulatory Visit: Payer: Medicare Other | Admitting: Cardiovascular Disease

## 2017-01-10 ENCOUNTER — Other Ambulatory Visit: Payer: Self-pay | Admitting: *Deleted

## 2017-01-10 MED ORDER — LABETALOL HCL 200 MG PO TABS: ORAL_TABLET | ORAL | 1 refills | Status: DC

## 2017-01-10 MED ORDER — ATORVASTATIN CALCIUM 40 MG PO TABS: 40.0000 mg | ORAL_TABLET | Freq: Every day | ORAL | 1 refills | Status: DC

## 2017-01-10 NOTE — Telephone Encounter (Signed)
Fax [facility] from [facility]ill [facility]. Prescriptions sent to requesting pharmacy.

## 2017-02-01 ENCOUNTER — Ambulatory Visit (INDEPENDENT_AMBULATORY_CARE_PROVIDER_SITE_OTHER): Payer: Medicare Other | Admitting: Physician Assistant

## 2017-02-01 ENCOUNTER — Encounter: Payer: Self-pay | Admitting: Physician Assistant

## 2017-02-01 VITALS — BP 115/68 | HR 98 | Ht 66.0 in | Wt 178.2 lb

## 2017-02-01 DIAGNOSIS — Z79899 Other long term (current) drug therapy: Secondary | ICD-10-CM

## 2017-02-01 DIAGNOSIS — I471 Supraventricular tachycardia: Secondary | ICD-10-CM

## 2017-02-01 DIAGNOSIS — E785 Hyperlipidemia, unspecified: Secondary | ICD-10-CM | POA: Diagnosis not present

## 2017-02-01 DIAGNOSIS — I519 Heart disease, unspecified: Secondary | ICD-10-CM

## 2017-02-01 DIAGNOSIS — E119 Type 2 diabetes mellitus without complications: Secondary | ICD-10-CM | POA: Diagnosis not present

## 2017-02-01 DIAGNOSIS — I10 Essential (primary) hypertension: Secondary | ICD-10-CM

## 2017-02-01 DIAGNOSIS — J810 Acute pulmonary edema: Secondary | ICD-10-CM | POA: Diagnosis not present

## 2017-02-01 LAB — BASIC METABOLIC PANEL WITH GFR
BUN/Creatinine Ratio: 19 (ref 12–28)
BUN: 15 mg/dL (ref 8–27)
CO2: 25 mmol/L (ref 20–29)
Calcium: 9.7 mg/dL (ref 8.7–10.3)
Chloride: 96 mmol/L (ref 96–106)
Creatinine, Ser: 0.81 mg/dL (ref 0.57–1.00)
GFR calc Af Amer: 77 mL/min/1.73
GFR calc non Af Amer: 67 mL/min/1.73
Glucose: 122 mg/dL — ABNORMAL HIGH (ref 65–99)
Potassium: 4.1 mmol/L (ref 3.5–5.2)
Sodium: 138 mmol/L (ref 134–144)

## 2017-02-01 MED ORDER — NITROGLYCERIN 0.4 MG SL SUBL: 0.4000 mg | SUBLINGUAL_TABLET | SUBLINGUAL | 3 refills | Status: DC | PRN

## 2017-02-01 NOTE — Patient Instructions (Addendum)
Medication Instructions:  Continue current medications  Labwork: BMP Today  Testing/Procedures: Your physician has requested that you have a lexiscan myoview. For further information please visit [email or web address]. Please follow instruction sheet, as given.    Avoid any caffeinated drinks for 12 hours prior to stress test. No food or drinks in the morning of stress test  Follow-Up: Your physician recommends that you schedule a follow-up appointment in: 3 Months with Dr Tresa EndoKelly.   Any Other Special Instructions Will Be Listed Below (If Applicable).   If you need a refill on your cardiac medications before your next appointment, please call your pharmacy.

## 2017-02-01 NOTE — Progress Notes (Signed)
Cardiology Office Note    Date:  02/02/2017   ID:  Tracey Porter, DOB 01/08/1930, MRN [id number]  PCP:  [place]az[place], Julie M, NP  Cardiologist:  Dr. Tresa EndoKelly  Chief Complaint  Patient presents with  . Follow-up    seen for Dr. Tresa EndoKelly    History of Present Illness:  Tracey PingConstance Lantz is a 83 y.o. female with PMH of HTN, HLD, DM and SVT. She originally came from [place] and moved to [place] 8 years ago. In October 2017, [facility] with initial urinary complaints and found to have SVT. ED physician discussed the case with cardiologist on call and she was placed on diltiazem LA 120 mg daily. Her diltiazem dose was eventually uptitrated to Diltiazem CD 240 mg daily along with labetalol 200 mg twice a day. Hydrochlorothiazide was discontinued. 2-D echo obtained on 11/80/2017 showed EF 65-70%, normal wall motion, grade 2 DD, mitral annular calcification with mild to moderate MR, mild aortic valve stenosis, severe LA dilatation, moderate TR, mild to moderate pulmonary hypertension with PA peak pressure 44 mmHg. She continued to take atorvastatin 40 mg for hyperlipidemia and metformin for diabetes. Her last office visit with Dr. Tresa EndoKelly was on 10/22/2016, her labetalol was increased to 300 mg in a.m. and 200 mg in p.m.  More recently, she was [facility]/2017 with acute onset hypertensive pulmonary edema. She rapidly improved with diuresis and blood pressure control. Echocardiogram revealed mildly reduced LV function with EF 45-50% with mild anteroseptal wall motion abnormality, moderate mitral regurgitation, mild-to-moderate tricuspid regurgitation, estimated RV systolic pressure 48 mmHg. Although detectable, her troponins were not diagnostic of MI. Her labetalol dosing was further uptitrated and low-dose Lasix was added. Interestingly, it appears patient was at her primary care physician's office the day before hospital  admission and the blood pressure at the time was 112/80.  Patient presents today for cardiology office visit, she was unsure what caused the episode of hypertensive urgency with flash pulmonary edema either. Normally her blood pressure is well-controlled at home. She says she woke up around 4 AM in the morning feeling severely short of breath at the time. There has been no prior episode and no recurrence since. Given lack of recurrence, I want to hold off on secondary hypertension workup such as cortisone level and renal artery ultrasound. On further questioning, she says sometimes she has chest pain. However her chest pain is not brought on by exertion. It can spontaneously occur. She goes swimming 3 times a week and the does not experience chest pain at rest. She says it occurs roughly once a month. She is flying to [place] next Monday to be with her family. Given the recent LV dysfunction and her intermittent chest discomfort, I plan to obtain outpatient Lexiscan Myoview. I have given the patient a prescription for sublingual nitroglycerin. Given the fact she has been using Lasix, I will obtain a basic metabolic panel to assess renal function.   Past Medical History:  Diagnosis Date  . Diabetes mellitus without complication (HCC)   . Hypertension     Past Surgical History:  Procedure Laterality Date  . ABDOMINAL HYSTERECTOMY  1976  . EYE SURGERY      Current Medications: Outpatient Medications Prior to Visit  Medication Sig Dispense Refill  . ACYCLOVIR PO Take 800 mg by mouth 2 (two) times daily.     . ALENDRONATE SODIUM PO Take 70 mg by mouth once a week.     .[name]  atorvastatin (LIPITOR) 40 MG tablet Take 1 tablet (40 mg total) by mouth daily. 90 tablet 1  . metFORMIN (GLUCOPHAGE) 500 MG tablet Take 500 mg by mouth daily with breakfast.    . diltiazem (CARDIZEM CD) 240 MG 24 hr capsule Take 1 capsule (240 mg total) by mouth daily. 90 capsule 3  . labetalol (NORMODYNE) 200 MG tablet Take  1.5 tablets (300 mg total) by mouth every morning and take 1 tablet (200 mg total) by mouth by mouth every evening. 225 tablet 1   No facility-administered medications prior to visit.      Allergies:   Penicillins   Social History   Social History  . Marital status: Widowed    Spouse name: N/A  . Number of children: N/A  . Years of education: N/A   Social History Main Topics  . Smoking status: Former Games developer  . Smokeless tobacco: Never Used  . Alcohol use Yes  . Drug use: No  . Sexual activity: Not Asked   Other Topics Concern  . None   Social History Narrative  . None     Family History:  The patient's family history includes Lung cancer in her father; Lymphoma in her daughter.   ROS:   Please see the history of present illness.    ROS All other systems reviewed and are negative.   PHYSICAL EXAM:   VS:  BP 115/68 (BP Location: Left Arm, Cuff Size: Normal)   Pulse 98   Ht 5\' 6"  (1.676 m)   Wt 178 lb 3.2 oz (80.8 kg)   SpO2 96%   BMI 28.76 kg/m    GEN: Well nourished, well developed, in no acute distress  HEENT: normal  Neck: no JVD, carotid bruits, or masses Cardiac: RRR; no murmurs, rubs, or gallops,no edema  Respiratory:  clear to auscultation bilaterally, normal work of breathing GI: soft, nontender, nondistended, + BS MS: no deformity or atrophy  Skin: warm and dry, no rash Neuro:  Alert and Oriented x 3, Strength and sensation are intact Psych: euthymic mood, full affect  Wt Readings from Last 3 Encounters:  02/01/17 178 lb 3.2 oz (80.8 kg)  10/22/16 186 lb (84.4 kg)  06/23/16 184 lb 6.4 oz (83.6 kg)      Studies/Labs Reviewed:   EKG:  EKG is not ordered today.   Recent Labs: 04/20/2016: Hemoglobin 14.2; Platelets 309 02/01/2017: BUN 15; Creatinine, Ser 0.81; Potassium 4.1; Sodium 138   Lipid Panel No results found for: CHOL, TRIG, HDL, CHOLHDL, VLDL, LDLCALC, LDLDIRECT  Additional studies/ records that were reviewed today include:   Echo  05/19/2016 LV EF: 65% -   70%  Study Conclusions  - Left ventricle: The cavity size was normal. Wall thickness was   normal. Systolic function was vigorous. The estimated ejection   fraction was in the range of 65% to 70%. Wall motion was normal;   there were no regional wall motion abnormalities. Features are   consistent with a pseudonormal left ventricular filling pattern,   with concomitant abnormal relaxation and increased filling   pressure (grade 2 diastolic dysfunction). - Aortic valve: There was mild stenosis. - Mitral valve: Calcified annulus. Mildly calcified leaflets .   There was mild to moderate regurgitation. - Left atrium: The atrium was severely dilated. - Tricuspid valve: There was moderate regurgitation. - Pulmonary arteries: PA peak pressure: 44 mm Hg (S).    Echo 01/28/2017 SUMMARY The left ventricular size is normal. Moderate left ventricular hypertrophy  Left ventricular  systolic function is mildly reduced. Mild hypokinesis of the anterior wall and anteroseptal wall.  Left ventricular filling pattern is indeterminate. The right ventricle is not well visualized but appears mildly dilated with  normal systolic function. The left atrium is mildly to moderately dilated. There is moderate mitral regurgitation. There is mild to moderate tricuspid regurgitation. Mild to moderate pulmonary hypertension. There is no pericardial effusion. Dilated IVC consistent with elevated RA pressure. There is no comparison study available.    ASSESSMENT:    1. Flash pulmonary edema (HCC)   2. LV dysfunction   3. Medication management   4. Essential hypertension   5. Hyperlipidemia, unspecified hyperlipidemia type   6. Controlled type 2 diabetes mellitus without complication, without long-term current use of insulin (HCC)   7. SVT (supraventricular tachycardia) (HCC)      PLAN:  In order of problems listed above:  1. Flash pulmonary edema: Single solitary  episode, we will hold off on secondary hypertension workup such as cortisone level and renal arteries ultrasound. I asked her to take additional Lasix if symptoms recur. Continue on Lasix, will obtain basic metabolic panel today.  2. LV dysfunction: EF was 45-50% with mild anteroseptal wall motion abnormality at [facility]. This is significantly different compared to her previous echocardiogram on 05/19/2016 which showed EF 65-70%. I recommended a Lexiscan Myoview. I have given her some sublingual nitroglycerin. She denies any chest discomfort, Myoview will be completed once she returned from [place].  3. Hypertension: Blood pressure normally well controlled, single episode of hypertensive emergency  4. Hyperlipidemia: On Lipitor 40 mg daily  5. DM 2: On metformin, managed by primary care provider.  6. History of SVT: No obvious sign of recurrence.    Medication Adjustments/Labs and Tests Ordered: Current medicines are reviewed at length with the patient today.  Concerns regarding medicines are outlined above.  Medication changes, Labs and Tests ordered today are listed in the Patient Instructions below. Patient Instructions  Medication Instructions:  Continue current medications  Labwork: BMP Today  Testing/Procedures: Your physician has requested that you have a lexiscan myoview. For further information please visit [email or web address] Please follow instruction sheet, as given.    Avoid any caffeinated drinks for 12 hours prior to stress test. No food or drinks in the morning of stress test  Follow-Up: Your physician recommends that you schedule a follow-up appointment in: 3 Months with Dr Tresa Endo.   Any Other Special Instructions Will Be Listed Below (If Applicable).   If you need a refill on your cardiac medications before your next appointment, please call your pharmacy.      Ramond Dial, [place]  02/02/2017 5:10 AM    [facility]  [facility] [address] [place], [place], [place]  [address] Phone: [phone]; Fax: [phone]

## 2017-02-02 ENCOUNTER — Encounter: Payer: Self-pay | Admitting: Physician Assistant

## 2017-02-04 ENCOUNTER — Telehealth: Payer: Self-pay | Admitting: Cardiovascular Disease

## 2017-02-04 MED ORDER — LABETALOL HCL 200 MG PO TABS: 200.0000 mg | ORAL_TABLET | Freq: Two times a day (BID) | ORAL | 3 refills | Status: DC

## 2017-02-04 NOTE — Telephone Encounter (Signed)
She used to be on 200mg  BID of labetalol in the past along with diltiazem, according to Dr. Landry DykeKelly's note, the labetalol was increased to 300mg  in AM and 200mg  in PM in April as her SBP was in the 130s back then. Her current BP of 110/62 is not low, but beta blocker in general can cause fatigue. If it is interfering with her lifestyle, I am ok with decreasing to 200mg  BID.

## 2017-02-04 NOTE — Telephone Encounter (Signed)
Received call back from patient, patient wondering if she should be taking 300mg  labetolol BID.  Reports a RN took her BP and it was 110/62 and she feels tired.   Patient doesn't think she needs to be taking this much medication as she thinks her BP is too low.     Per last OV with Dr. Tresa EndoKelly patient was suppose to take 300mg  in the AM and 200mg  in the PM ([facility]oid[facility]).  Patient was unaware of this and reports she has 300mg  tablets?   Advised BP is a good reading, not abnormally low but would route to ArbutusMeng PA  to clarify dosage.  Patient aware and verbalized understanding.     Med list states 300mg  BID but is listed as historical provider (unsure if this was entered incorrectly).

## 2017-02-04 NOTE — Telephone Encounter (Signed)
F/u message  Pt calling back to speak with RN about pervious phone message. Please call back to discuss

## 2017-02-04 NOTE — Telephone Encounter (Signed)
lmtcb

## 2017-02-04 NOTE — Telephone Encounter (Signed)
New message   Pt c/o medication issue:  1. Name of Medication: labetalol (NORMODYNE) 300 MG tablet  2. How are you currently taking this medication (dosage and times per day)? 300mg  2 times daily  3. Are you having a reaction (difficulty breathing--STAT)? Extremely low bp  4. What is your medication issue? Pt states bp is extremely low and thinks she should only be taking the labetalol (NORMODYNE) 300 MG tablet one time daily

## 2017-02-04 NOTE — Telephone Encounter (Signed)
Returned patient call, aware I am awaiting review and clarification.

## 2017-02-04 NOTE — Telephone Encounter (Signed)
Patient aware and verbalized understanding.  New rx sent

## 2017-02-08 ENCOUNTER — Telehealth: Payer: Self-pay | Admitting: Cardiovascular Disease

## 2017-02-08 MED ORDER — FUROSEMIDE 20 MG PO TABS: 20.0000 mg | ORAL_TABLET | Freq: Every day | ORAL | 3 refills | Status: DC

## 2017-02-08 MED ORDER — LABETALOL HCL 200 MG PO TABS: 200.0000 mg | ORAL_TABLET | Freq: Two times a day (BID) | ORAL | 3 refills | Status: DC

## 2017-02-08 NOTE — Telephone Encounter (Signed)
Refill sent to the pharmacy electronically.  

## 2017-02-08 NOTE — Telephone Encounter (Signed)
New message        *STAT* If patient is at the pharmacy, call can be transferred to refill team.   1. Which medications need to be refilled? (please list name of each medication and dose if known)  Labetalol 300mg  bid and furosemide 20mg  daily 2. Which pharmacy/location (including street and city if local pharmacy) is medication to be sent to?pill pack----[phone] or [phone] 3. Do they need a 30 day or 90 day supply? 30 day

## 2017-02-17 ENCOUNTER — Telehealth (HOSPITAL_COMMUNITY): Payer: Self-pay

## 2017-02-17 NOTE — Telephone Encounter (Signed)
Encounter complete. 

## 2017-02-21 ENCOUNTER — Telehealth: Payer: Self-pay | Admitting: Cardiovascular Disease

## 2017-02-21 NOTE — Telephone Encounter (Signed)
LMTCB

## 2017-02-21 NOTE — Telephone Encounter (Signed)
New message      Pt is due to have a nuclear stress test tomorrow am.  Calling to get any instructions regarding medications and food.  Please call

## 2017-02-22 ENCOUNTER — Other Ambulatory Visit: Payer: Self-pay

## 2017-02-22 ENCOUNTER — Ambulatory Visit (HOSPITAL_COMMUNITY)
Admission: RE | Admit: 2017-02-22 | Discharge: 2017-02-22 | Disposition: A | Discharge status: Home / Self Care | Payer: Medicare Other | Source: Ambulatory Visit | Attending: Cardiovascular Disease | Admitting: Cardiovascular Disease

## 2017-02-22 ENCOUNTER — Other Ambulatory Visit: Payer: Self-pay | Admitting: Cardiovascular Disease

## 2017-02-22 DIAGNOSIS — E785 Hyperlipidemia, unspecified: Secondary | ICD-10-CM | POA: Insufficient documentation

## 2017-02-22 DIAGNOSIS — E119 Type 2 diabetes mellitus without complications: Secondary | ICD-10-CM | POA: Insufficient documentation

## 2017-02-22 DIAGNOSIS — J810 Acute pulmonary edema: Secondary | ICD-10-CM | POA: Insufficient documentation

## 2017-02-22 DIAGNOSIS — I10 Essential (primary) hypertension: Secondary | ICD-10-CM | POA: Insufficient documentation

## 2017-02-22 DIAGNOSIS — R079 Chest pain, unspecified: Secondary | ICD-10-CM | POA: Insufficient documentation

## 2017-02-22 DIAGNOSIS — I519 Heart disease, unspecified: Secondary | ICD-10-CM | POA: Insufficient documentation

## 2017-02-22 LAB — MYOCARDIAL PERFUSION IMAGING
CHL CUP NUCLEAR SSS: 3
CHL CUP RESTING HR STRESS: 103 {beats}/min
LV dias vol: 67 mL (ref 46–106)
LV sys vol: 27 mL
NUC STRESS TID: 0.81
Peak HR: 117 {beats}/min
SDS: 2
SRS: 1

## 2017-02-22 IMAGING — NM NM MISC PROCEDURE
9 series · 54 of 54 positions shown · non-contrast
Comparison: none

[Series 1: wbr_r-proj_st wbr rest · 6.40mm/px · 6 of 64 frames shown]
[frame 6/64]
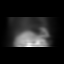
[frame 16/64]
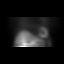
[frame 27/64]
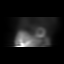
[frame 38/64]
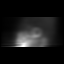
[frame 48/64]
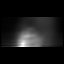
[frame 59/64]
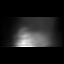

[Series 1: wbr rest · 6.40mm/px · 6 of 64 frames shown]
[frame 6/64]
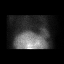
[frame 16/64]
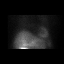
[frame 27/64]
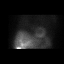
[frame 38/64]
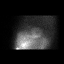
[frame 48/64]
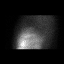
[frame 59/64]
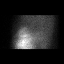

[Series 1: rest sax · 6.4mm · 6.40mm/px · 6 of 23 frames shown]
[frame 2/23]
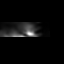
[frame 6/23]
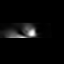
[frame 10/23]
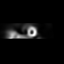
[frame 14/23]
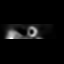
[frame 18/23]
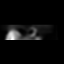
[frame 22/23]
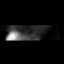

[Series 2: stress sax gs · 6.4mm · 6.40mm/px · 6 of 184 frames shown]
[frame 16/184]
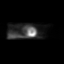
[frame 46/184]
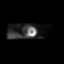
[frame 77/184]
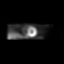
[frame 108/184]
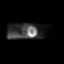
[frame 138/184]
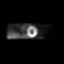
[frame 169/184]
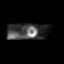

[Series 2: wbr stress-gsp · 6.40mm/px · 6 of 512 frames shown]
[frame 43/512]
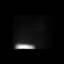
[frame 128/512]
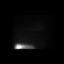
[frame 214/512]
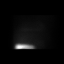
[frame 299/512]
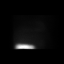
[frame 384/512]
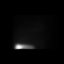
[frame 470/512]
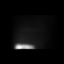

[Series 2: wbr_s-proj_st wbr stress-gsp · 6.40mm/px · 6 of 512 frames shown]
[frame 43/512]
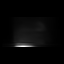
[frame 128/512]
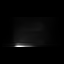
[frame 214/512]
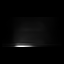
[frame 299/512]
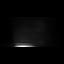
[frame 384/512]
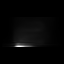
[frame 470/512]
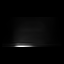

[Series 2: stress sax · 6.4mm · 6.40mm/px · 6 of 23 frames shown]
[frame 2/23]
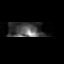
[frame 6/23]
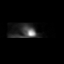
[frame 10/23]
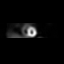
[frame 14/23]
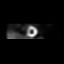
[frame 18/23]
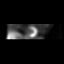
[frame 22/23]
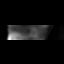

[Series 3: wbr_s-proj_st wbr stress-sum-em · 6.40mm/px · 6 of 64 frames shown]
[frame 6/64]
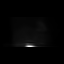
[frame 16/64]
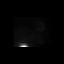
[frame 27/64]
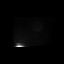
[frame 38/64]
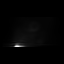
[frame 48/64]
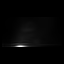
[frame 59/64]
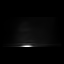

[Series 3: wbr stress-sum-em · 6.40mm/px · 6 of 64 frames shown]
[frame 6/64]
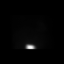
[frame 16/64]
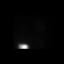
[frame 27/64]
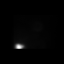
[frame 38/64]
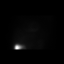
[frame 48/64]
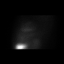
[frame 59/64]
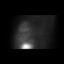

[54 of 54 positions shown; findings below may reference images not displayed]

Canned report from images found in remote index.

Refer to host system for actual result text.

## 2017-02-22 MED ORDER — TECHNETIUM TC 99M TETROFOSMIN IV KIT
31.5000 | PACK | Freq: Once | INTRAVENOUS | Status: AC | PRN
  Administered 2017-02-22: 31.5 via INTRAVENOUS
  Filled 2017-02-22: qty 32

## 2017-02-22 MED ORDER — TECHNETIUM TC 99M TETROFOSMIN IV KIT
10.7000 | PACK | Freq: Once | INTRAVENOUS | Status: AC | PRN
  Administered 2017-02-22: 10.7 via INTRAVENOUS
  Filled 2017-02-22: qty 11

## 2017-02-22 MED ORDER — REGADENOSON 0.4 MG/5ML IV SOLN
0.4000 mg | Freq: Once | INTRAVENOUS | Status: AC
  Administered 2017-02-22: 0.4 mg via INTRAVENOUS

## 2017-02-22 NOTE — Telephone Encounter (Signed)
New message    *STAT* If patient is at the pharmacy, call can be transferred to refill team.   1. Which medications need to be refilled? (please list name of each medication and dose if known) labetalol (NORMODYNE) 200 MG tablet  2. Which pharmacy/location (including street and city if local pharmacy) is medication to be sent to? [phone] PILL PACK  3. Do they need a 30 day or 90 day supply? 30 day supply

## 2017-02-22 NOTE — Telephone Encounter (Signed)
Called patients home phone and was told by a child that patient is no longer living and that their parents weren't available.  Spoke with patients daughter, Victorino DikeJennifer, and she told me the best way to reach the patient is by her cell. She stated that patients Labetalol was increased to 300mg  bid. I informed her that the chart stated patient is taking 200mg  tablet bid. She stated that was incorrect and that it was changed after her mothers last visit with Azalee CourseHao Meng on 02/01/17. I told her I would speak with Wynema BirchHao and get back to her mom.  I spoke with Wynema BirchHao and he stated Labetalol was increased during the last ov with Dr Tresa EndoKelly, but patient was complaining of low blood pressure and interfering with lifestyle so med was decreased to 200mg  bid by Wynema BirchHao. He stated it was ok to refill med however patient was taking it and if she is taking the 300mg  bid for her to monitor her bp.  Spoke directly with patient and she wasn't sure of her correct dosage of Labetalol. I told her Dr Tresa EndoKelly increased the med to 300mg  in the morning and 200mg  in the evening. I told her the med was decreased to 200mg  bid because she called on 02/04/17 and stated that her bp was 110/62, she had fatigue and that it was interfering with her lifestyle. Per Wynema BirchHao the med was changed to 200mg  bid. Patient voiced understanding. Explained to patient that I called her pharmacy and was told she put her refill on hold and that she needed to contact [facility]. I gave her the number and she voiced understanding.

## 2017-02-22 NOTE — Telephone Encounter (Signed)
Called Pharmacy to verify that they received the rx for Labetalol, but patient called and asked them to pause her shipment right before they were about to send it out.   Unable to reach patient no voicemail on home phone and cell phone disconnected.   Will attempt to call patient later.

## 2017-02-22 NOTE — Telephone Encounter (Signed)
error 

## 2017-02-23 NOTE — Telephone Encounter (Signed)
Testing done

## 2017-02-23 NOTE — Progress Notes (Signed)
Plan to discuss the case this week on followup

## 2017-02-24 ENCOUNTER — Encounter: Payer: Self-pay | Admitting: Physician Assistant

## 2017-02-24 ENCOUNTER — Ambulatory Visit (INDEPENDENT_AMBULATORY_CARE_PROVIDER_SITE_OTHER): Payer: Medicare Other | Admitting: Physician Assistant

## 2017-02-24 VITALS — BP 120/82 | HR 107 | Ht 66.0 in | Wt 176.4 lb

## 2017-02-24 DIAGNOSIS — I481 Persistent atrial fibrillation: Secondary | ICD-10-CM

## 2017-02-24 DIAGNOSIS — I471 Supraventricular tachycardia, unspecified: Secondary | ICD-10-CM

## 2017-02-24 DIAGNOSIS — E119 Type 2 diabetes mellitus without complications: Secondary | ICD-10-CM

## 2017-02-24 DIAGNOSIS — Z79899 Other long term (current) drug therapy: Secondary | ICD-10-CM | POA: Diagnosis not present

## 2017-02-24 DIAGNOSIS — I10 Essential (primary) hypertension: Secondary | ICD-10-CM | POA: Diagnosis not present

## 2017-02-24 DIAGNOSIS — E785 Hyperlipidemia, unspecified: Secondary | ICD-10-CM

## 2017-02-24 DIAGNOSIS — I4819 Other persistent atrial fibrillation: Secondary | ICD-10-CM

## 2017-02-24 MED ORDER — LABETALOL HCL 300 MG PO TABS: 300.0000 mg | ORAL_TABLET | Freq: Two times a day (BID) | ORAL | 3 refills | Status: DC

## 2017-02-24 MED ORDER — DILTIAZEM HCL ER COATED BEADS 360 MG PO CP24: 360.0000 mg | ORAL_CAPSULE | Freq: Every day | ORAL | 0 refills | Status: DC

## 2017-02-24 MED ORDER — LABETALOL HCL 300 MG PO TABS: 300.0000 mg | ORAL_TABLET | Freq: Two times a day (BID) | ORAL | 0 refills | Status: DC

## 2017-02-24 MED ORDER — APIXABAN 5 MG PO TABS: 5.0000 mg | ORAL_TABLET | Freq: Two times a day (BID) | ORAL | Status: DC

## 2017-02-24 MED ORDER — APIXABAN 5 MG PO TABS: 5.0000 mg | ORAL_TABLET | Freq: Two times a day (BID) | ORAL | 3 refills | Status: AC

## 2017-02-24 MED ORDER — DILTIAZEM HCL ER COATED BEADS 360 MG PO CP24: 360.0000 mg | ORAL_CAPSULE | Freq: Every day | ORAL | 3 refills | Status: DC

## 2017-02-24 NOTE — Progress Notes (Signed)
Cardiology Office Note    Date:  02/26/2017   ID:  Tracey Porter, DOB November 03, 1929, MRN [id number]  PCP:  [place], Julie M, NP  Cardiologist:  Dr. Tresa Endo  Chief Complaint  Patient presents with  . Follow-up    seen for Dr. Tresa Endo    History of Present Illness:  Tracey Porter is a 83 y.o. female with PMH of HTN, HLD, DM and SVT. She originally came from [place] and moved to [place] 8 years ago. In October 2017, she presented to [facility] with initial urinary complaints and found to have SVT. ED physician discussed the case with cardiologist on call and she was placed on diltiazem LA 120 mg daily. Her diltiazem dose was eventually uptitrated to Diltiazem CD 240 mg daily along with labetalol 200 mg twice a day. Hydrochlorothiazide was discontinued. 2-D echo obtained on 11/80/2017 showed EF 65-70%, normal wall motion, grade 2 DD, mitral annular calcification with mild to moderate MR, mild aortic valve stenosis, severe LA dilatation, moderate TR, mild to moderate pulmonary hypertension with PA peak pressure 44 mmHg. She continued to take atorvastatin 40 mg for hyperlipidemia and metformin for diabetes. Her last office visit with Dr. Tresa Endo was on 10/22/2016, her labetalol was increased to 300 mg in a.m. and 200 mg in p.m.  More recently, she was admitted to [facility] on 01/28/2017 with acute onset hypertensive pulmonary edema. She rapidly improved with diuresis and blood pressure control. Echocardiogram revealed mildly reduced LV function with EF 45-50% with mild anteroseptal wall motion abnormality, moderate mitral regurgitation, mild-to-moderate tricuspid regurgitation, estimated RV systolic pressure 48 mmHg. Although detectable, her troponins were not diagnostic of MI. Her labetalol dosing was further uptitrated and low-dose Lasix was added. Interestingly, it appears patient was at her primary care physician's office the day before hospital  admission and the blood pressure at the time was 112/80. Given her recent LV dysfunction ordered a Lexiscan Myoview. Lexiscan Myoview performed on 02/22/2017 came back low risk with EF 60%, no ischemia however patient was noted to be in atrial fibrillation during the study.  She was add-on for treatment of afib today. She has no cardiac awareness of atrial fibrillation. We discussed the pathophysiology behind atrial fibrillation. We also discussed the benefit and risk between the 2 additional Coumadin versus NOAC. She will need to cut back on alcohol. I will obtain basic metabolic panel and TSH today. Otherwise she was started on eliquis 5 mg twice a day. Her heart rate is poorly controlled, I will increase her diltiazem CD to 360 mg daily. She will continue on labetalol 300 mg twice a day dose. Otherwise she denies any chest pain, shortness breath, lower extremity edema, orthopnea or PND.   Past Medical History:  Diagnosis Date  . Diabetes mellitus without complication (HCC)   . Hypertension     Past Surgical History:  Procedure Laterality Date  . ABDOMINAL HYSTERECTOMY  1976  . EYE SURGERY      Current Medications: Outpatient Medications Prior to Visit  Medication Sig Dispense Refill  . ACYCLOVIR PO Take 800 mg by mouth 2 (two) times daily.     . ALENDRONATE SODIUM PO Take 70 mg by mouth once a week.     [name] atorvastatin (LIPITOR) 40 MG tablet Take 1 tablet (40 mg total) by mouth daily. 90 tablet 1  . furosemide (LASIX) 20 MG tablet Take 1 tablet (20 mg total) by mouth daily. 90 tablet 3  . metFORMIN (GLUCOPHAGE)  500 MG tablet Take 500 mg by mouth daily with breakfast.    . nitroGLYCERIN (NITROSTAT) 0.4 MG SL tablet Place 1 tablet (0.4 mg total) under the tongue every 5 (five) minutes as needed for chest pain. 25 tablet 3  . diltiazem (CARDIZEM CD) 240 MG 24 hr capsule Take 1 capsule (240 mg total) by mouth daily. 90 capsule 3  . labetalol (NORMODYNE) 200 MG tablet Take 1 tablet (200 mg  total) by mouth 2 (two) times daily. 180 tablet 3   No facility-administered medications prior to visit.      Allergies:   Penicillins   Social History   Social History  . Marital status: Widowed    Spouse name: N/A  . Number of children: N/A  . Years of education: N/A   Social History Main Topics  . Smoking status: Former Games developermoker  . Smokeless tobacco: Never Used  . Alcohol use Yes  . Drug use: No  . Sexual activity: Not Asked   Other Topics Concern  . None   Social History Narrative  . None     Family History:  The patient's family history includes Lung cancer in her father; Lymphoma in her daughter.   ROS:   Please see the history of present illness.    ROS All other systems reviewed and are negative.   PHYSICAL EXAM:   VS:  BP 120/82   Pulse (!) 107   Ht 5\' 6"  (1.676 m)   Wt 176 lb 6.4 oz (80 kg)   BMI 28.47 kg/m    GEN: Well nourished, well developed, in no acute distress  HEENT: normal  Neck: no JVD, carotid bruits, or masses Cardiac: Irregularly irregular; no murmurs, rubs, or gallops,no edema  Respiratory:  clear to auscultation bilaterally, normal work of breathing GI: soft, nontender, nondistended, + BS MS: no deformity or atrophy  Skin: warm and dry, no rash Neuro:  Alert and Oriented x 3, Strength and sensation are intact Psych: euthymic mood, full affect  Wt Readings from Last 3 Encounters:  02/24/17 176 lb 6.4 oz (80 kg)  02/22/17 178 lb (80.7 kg)  02/01/17 178 lb 3.2 oz (80.8 kg)      Studies/Labs Reviewed:   EKG:  EKG is ordered today.  The ekg ordered today demonstrates Atrial fibrillation, heart rate 107  Recent Labs: 04/20/2016: Hemoglobin 14.2; Platelets 309 02/25/2017: BUN 13; Creatinine, Ser 0.75; Potassium 3.4; Sodium 139; TSH 1.380   Lipid Panel No results found for: CHOL, TRIG, HDL, CHOLHDL, VLDL, LDLCALC, LDLDIRECT  Additional studies/ records that were reviewed today include:   Echo 05/19/2016 LV EF: 65% -  70%  Study Conclusions  - Left ventricle: The cavity size was normal. Wall thickness was normal. Systolic function was vigorous. The estimated ejection fraction was in the range of 65% to 70%. Wall motion was normal; there were no regional wall motion abnormalities. Features are consistent with a pseudonormal left ventricular filling pattern, with concomitant abnormal relaxation and increased filling pressure (grade 2 diastolic dysfunction). - Aortic valve: There was mild stenosis. - Mitral valve: Calcified annulus. Mildly calcified leaflets . There was mild to moderate regurgitation. - Left atrium: The atrium was severely dilated. - Tricuspid valve: There was moderate regurgitation. - Pulmonary arteries: PA peak pressure: 44 mm Hg (S).    Echo 01/28/2017 SUMMARY The left ventricular size is normal. Moderate left ventricular hypertrophy  Left ventricular systolic function is mildly reduced. Mild hypokinesis of the anterior wall and anteroseptal wall.  Left ventricular  filling pattern is indeterminate. The right ventricle is not well visualized but appears mildly dilated with  normal systolic function. The left atrium is mildly to moderately dilated. There is moderate mitral regurgitation. There is mild to moderate tricuspid regurgitation. Mild to moderate pulmonary hypertension. There is no pericardial effusion. Dilated IVC consistent with elevated RA pressure. There is no comparison study available.    Myoview 02/22/2017  Study Highlights     Nuclear stress EF: 60%.  The left ventricular ejection fraction is normal (55-65%).  There was no ST segment deviation noted during stress.  The study is normal.  This is a low risk study.   Low risk stress nuclear study with normal perfusion and normal left ventricular regional and global systolic function. Atrial fibrillation during the study.      ASSESSMENT:    1. Persistent atrial  fibrillation (HCC)   2. Medication management   3. SVT (supraventricular tachycardia) (HCC)   4. Essential hypertension   5. Hyperlipidemia, unspecified hyperlipidemia type   6. Controlled type 2 diabetes mellitus without complication, without long-term current use of insulin (HCC)      PLAN:  In order of problems listed above:  1. Persistent atrial fibrillation: Heart rate barely controlled on 300 mg twice a day of labetalol, will increase diltiazem CD to 360 mg daily. We have discussed the benefit and risk of additional Coumadin versus NOAC. Eventually we agreed to start on eliquis 5 mg twice a day. She denies any previous bleeding issue, no blood transfusion in the last 2 years. She denies any history of stroke. She denies any imbalance issue or frequent fall.  2. Hypertension: Blood pressure well-controlled 120/82. Should be able to tolerate mild increase in the diltiazem CD.  3. Hyperlipidemia: On Lipitor 40 mg daily.  4. DM 2: on metformin  5. History of SVT: No sign of recurrence. Recent EKG reviewed, more atrial fibrillation than SVT.    Medication Adjustments/Labs and Tests Ordered: Current medicines are reviewed at length with the patient today.  Concerns regarding medicines are outlined above.  Medication changes, Labs and Tests ordered today are listed in the Patient Instructions below. Patient Instructions  Medication Instructions:  INCREASE DILTIAZEM TO 360MG   START ELIQUIS(Apixaban) 5MG  TWICE DAILY CHANGE LABETALOL 300MG  TWICE DAILY If you need a refill on your cardiac medications before your next appointment, please call your pharmacy.  Labwork: TSH AND BMP TODAY HERE IN OUR OFFICE AT [facility]  Follow-Up: Your physician wants you to follow-up in: AS SCHEDULED WITH DR Tresa Endo.    Thank you for choosing [facility] at [employer], [place]  02/26/2017 11:01 AM    [facility] [address] [place], [place], [place]   [address] Phone: [phone]; Fax: [phone]

## 2017-02-24 NOTE — Patient Instructions (Signed)
Medication Instructions:  INCREASE DILTIAZEM TO 360MG   START ELIQUIS(Apixaban) 5MG  TWICE DAILY CHANGE LABETALOL 300MG  TWICE DAILY If you need a refill on your cardiac medications before your next appointment, please call your pharmacy.  Labwork: TSH AND BMP TODAY HERE IN OUR OFFICE AT [facility]  Follow-Up: Your physician wants you to follow-up in: AS SCHEDULED WITH DR Tresa EndoKELLY.    Thank you for choosing [facility]!!

## 2017-02-25 ENCOUNTER — Ambulatory Visit: Payer: Medicare Other | Admitting: Cardiovascular Disease

## 2017-02-25 ENCOUNTER — Other Ambulatory Visit: Payer: Self-pay | Admitting: Cardiovascular Disease

## 2017-02-25 MED ORDER — DILTIAZEM HCL ER COATED BEADS 360 MG PO CP24: 360.0000 mg | ORAL_CAPSULE | Freq: Every day | ORAL | 3 refills | Status: DC

## 2017-02-25 NOTE — Telephone Encounter (Signed)
Rx(s) sent to pharmacy electronically.  

## 2017-02-26 ENCOUNTER — Encounter: Payer: Self-pay | Admitting: Physician Assistant

## 2017-02-26 LAB — BASIC METABOLIC PANEL
BUN/Creatinine Ratio: 17 (ref 12–28)
BUN: 13 mg/dL (ref 8–27)
CHLORIDE: 98 mmol/L (ref 96–106)
CO2: 24 mmol/L (ref 20–29)
Calcium: 9.3 mg/dL (ref 8.7–10.3)
Creatinine, Ser: 0.75 mg/dL (ref 0.57–1.00)
GFR calc non Af Amer: 73 mL/min/{1.73_m2} (ref >59)
GFR, EST AFRICAN AMERICAN: 84 mL/min/{1.73_m2} (ref >59)
GLUCOSE: 118 mg/dL — AB (ref 65–99)
POTASSIUM: 3.4 mmol/L — AB (ref 3.5–5.2)
SODIUM: 139 mmol/L (ref 134–144)

## 2017-02-26 LAB — TSH: TSH: 1.38 u[IU]/mL (ref 0.450–4.500)

## 2017-03-03 ENCOUNTER — Telehealth: Payer: Self-pay | Admitting: *Deleted

## 2017-03-03 ENCOUNTER — Telehealth: Payer: Self-pay | Admitting: Cardiovascular Disease

## 2017-03-03 NOTE — Telephone Encounter (Signed)
Left msg for patient to call on 8/20 and today. I see she returned a call for stress test results but does not appear she has received instructions regarding labwork & medication change recommendations.

## 2017-03-03 NOTE — Telephone Encounter (Signed)
Patient has been made aware of the results and verbalized her understanding:  Low risk stress nuclear study with normal perfusion and normal left ventricular regional and global systolic function.

## 2017-03-03 NOTE — Telephone Encounter (Signed)
P would like Stress Test results from 02-22-17 please.

## 2017-03-03 NOTE — Telephone Encounter (Signed)
-----   Message from [place], [place] sent at 02/28/2017  4:12 PM EDT ----- Thyroid level normal. No obvious sign of dehydration, BUN/Cr ratio 17. Kidney function normal. Electrolyte stable other than potassium mildly low again. Add potassium supplement daily. Repeat lab in 3 weeks

## 2017-03-10 ENCOUNTER — Other Ambulatory Visit: Payer: Self-pay | Admitting: *Deleted

## 2017-03-10 DIAGNOSIS — Z79899 Other long term (current) drug therapy: Secondary | ICD-10-CM

## 2017-03-10 MED ORDER — POTASSIUM CHLORIDE ER 10 MEQ PO TBCR: 10.0000 meq | EXTENDED_RELEASE_TABLET | Freq: Every day | ORAL | 1 refills | Status: DC

## 2017-03-10 NOTE — Telephone Encounter (Signed)
Called patient to wrap up on this. She's aware of recommendation to start the 10meq tablet of potassium. [facility] [place]. After I got off phone with her I tried to send it there, called, discovered this is a closed location.  Called patient back and left msg to inform her I was sending it to [facility] in [place]. Left address for this location & if advised she had a different preference, call back and I can send rx to other pharmacy instead.

## 2017-03-19 ENCOUNTER — Emergency Department (HOSPITAL_COMMUNITY): Payer: Medicare Other

## 2017-03-19 ENCOUNTER — Inpatient Hospital Stay (HOSPITAL_COMMUNITY)
Admission: EM | Admit: 2017-03-19 | Discharge: 2017-03-31 | DRG: 963 | Disposition: A | Discharge status: Skilled Nursing Facility | Payer: Medicare Other | Attending: Family Medicine | Admitting: Family Medicine

## 2017-03-19 ENCOUNTER — Encounter (HOSPITAL_COMMUNITY): Payer: Self-pay | Admitting: Emergency Medicine

## 2017-03-19 DIAGNOSIS — W109XXA Fall (on) (from) unspecified stairs and steps, initial encounter: Secondary | ICD-10-CM | POA: Diagnosis present

## 2017-03-19 DIAGNOSIS — I4891 Unspecified atrial fibrillation: Secondary | ICD-10-CM | POA: Diagnosis not present

## 2017-03-19 DIAGNOSIS — I471 Supraventricular tachycardia, unspecified: Secondary | ICD-10-CM | POA: Diagnosis present

## 2017-03-19 DIAGNOSIS — I10 Essential (primary) hypertension: Secondary | ICD-10-CM | POA: Diagnosis present

## 2017-03-19 DIAGNOSIS — I110 Hypertensive heart disease with heart failure: Secondary | ICD-10-CM | POA: Diagnosis present

## 2017-03-19 DIAGNOSIS — S32811A Multiple fractures of pelvis with unstable disruption of pelvic ring, initial encounter for closed fracture: Principal | ICD-10-CM | POA: Diagnosis present

## 2017-03-19 DIAGNOSIS — H3530 Unspecified macular degeneration: Secondary | ICD-10-CM | POA: Diagnosis present

## 2017-03-19 DIAGNOSIS — I169 Hypertensive crisis, unspecified: Secondary | ICD-10-CM | POA: Diagnosis present

## 2017-03-19 DIAGNOSIS — J9811 Atelectasis: Secondary | ICD-10-CM | POA: Diagnosis present

## 2017-03-19 DIAGNOSIS — R0689 Other abnormalities of breathing: Secondary | ICD-10-CM

## 2017-03-19 DIAGNOSIS — D72829 Elevated white blood cell count, unspecified: Secondary | ICD-10-CM | POA: Diagnosis present

## 2017-03-19 DIAGNOSIS — I9589 Other hypotension: Secondary | ICD-10-CM | POA: Diagnosis present

## 2017-03-19 DIAGNOSIS — R0902 Hypoxemia: Secondary | ICD-10-CM

## 2017-03-19 DIAGNOSIS — D689 Coagulation defect, unspecified: Secondary | ICD-10-CM | POA: Diagnosis not present

## 2017-03-19 DIAGNOSIS — S36892A Contusion of other intra-abdominal organs, initial encounter: Secondary | ICD-10-CM | POA: Diagnosis present

## 2017-03-19 DIAGNOSIS — R062 Wheezing: Secondary | ICD-10-CM

## 2017-03-19 DIAGNOSIS — K567 Ileus, unspecified: Secondary | ICD-10-CM | POA: Diagnosis present

## 2017-03-19 DIAGNOSIS — Z7983 Long term (current) use of bisphosphonates: Secondary | ICD-10-CM

## 2017-03-19 DIAGNOSIS — D72825 Bandemia: Secondary | ICD-10-CM | POA: Diagnosis not present

## 2017-03-19 DIAGNOSIS — Z7984 Long term (current) use of oral hypoglycemic drugs: Secondary | ICD-10-CM

## 2017-03-19 DIAGNOSIS — I700 Atherosclerosis of aorta: Secondary | ICD-10-CM | POA: Diagnosis present

## 2017-03-19 DIAGNOSIS — Z87891 Personal history of nicotine dependence: Secondary | ICD-10-CM

## 2017-03-19 DIAGNOSIS — T45515A Adverse effect of anticoagulants, initial encounter: Secondary | ICD-10-CM | POA: Diagnosis present

## 2017-03-19 DIAGNOSIS — R140 Abdominal distension (gaseous): Secondary | ICD-10-CM | POA: Diagnosis present

## 2017-03-19 DIAGNOSIS — R58 Hemorrhage, not elsewhere classified: Secondary | ICD-10-CM | POA: Diagnosis present

## 2017-03-19 DIAGNOSIS — E119 Type 2 diabetes mellitus without complications: Secondary | ICD-10-CM | POA: Diagnosis not present

## 2017-03-19 DIAGNOSIS — Z79899 Other long term (current) drug therapy: Secondary | ICD-10-CM

## 2017-03-19 DIAGNOSIS — I5032 Chronic diastolic (congestive) heart failure: Secondary | ICD-10-CM | POA: Diagnosis present

## 2017-03-19 DIAGNOSIS — W19XXXA Unspecified fall, initial encounter: Secondary | ICD-10-CM | POA: Diagnosis not present

## 2017-03-19 DIAGNOSIS — E785 Hyperlipidemia, unspecified: Secondary | ICD-10-CM | POA: Diagnosis present

## 2017-03-19 DIAGNOSIS — Z7901 Long term (current) use of anticoagulants: Secondary | ICD-10-CM | POA: Diagnosis present

## 2017-03-19 DIAGNOSIS — Y92009 Unspecified place in unspecified non-institutional (private) residence as the place of occurrence of the external cause: Secondary | ICD-10-CM | POA: Diagnosis not present

## 2017-03-19 DIAGNOSIS — I2510 Atherosclerotic heart disease of native coronary artery without angina pectoris: Secondary | ICD-10-CM | POA: Diagnosis present

## 2017-03-19 DIAGNOSIS — S329XXA Fracture of unspecified parts of lumbosacral spine and pelvis, initial encounter for closed fracture: Secondary | ICD-10-CM | POA: Diagnosis present

## 2017-03-19 DIAGNOSIS — S32810A Multiple fractures of pelvis with stable disruption of pelvic ring, initial encounter for closed fracture: Secondary | ICD-10-CM

## 2017-03-19 DIAGNOSIS — T794XXA Traumatic shock, initial encounter: Secondary | ICD-10-CM | POA: Diagnosis present

## 2017-03-19 DIAGNOSIS — R52 Pain, unspecified: Secondary | ICD-10-CM

## 2017-03-19 DIAGNOSIS — T148XXA Other injury of unspecified body region, initial encounter: Secondary | ICD-10-CM

## 2017-03-19 DIAGNOSIS — Z794 Long term (current) use of insulin: Secondary | ICD-10-CM | POA: Diagnosis not present

## 2017-03-19 DIAGNOSIS — Z880 Allergy status to penicillin: Secondary | ICD-10-CM

## 2017-03-19 DIAGNOSIS — F0390 Unspecified dementia without behavioral disturbance: Secondary | ICD-10-CM | POA: Diagnosis present

## 2017-03-19 DIAGNOSIS — Z90710 Acquired absence of both cervix and uterus: Secondary | ICD-10-CM

## 2017-03-19 DIAGNOSIS — I509 Heart failure, unspecified: Secondary | ICD-10-CM

## 2017-03-19 DIAGNOSIS — D62 Acute posthemorrhagic anemia: Secondary | ICD-10-CM | POA: Diagnosis present

## 2017-03-19 DIAGNOSIS — I272 Pulmonary hypertension, unspecified: Secondary | ICD-10-CM | POA: Diagnosis present

## 2017-03-19 DIAGNOSIS — I481 Persistent atrial fibrillation: Secondary | ICD-10-CM | POA: Diagnosis present

## 2017-03-19 DIAGNOSIS — M898X9 Other specified disorders of bone, unspecified site: Secondary | ICD-10-CM | POA: Diagnosis present

## 2017-03-19 DIAGNOSIS — R109 Unspecified abdominal pain: Secondary | ICD-10-CM

## 2017-03-19 DIAGNOSIS — J441 Chronic obstructive pulmonary disease with (acute) exacerbation: Secondary | ICD-10-CM | POA: Diagnosis present

## 2017-03-19 DIAGNOSIS — R0602 Shortness of breath: Secondary | ICD-10-CM

## 2017-03-19 LAB — CBC WITH DIFFERENTIAL/PLATELET
Basophils Absolute: 0 10*3/uL (ref 0.0–0.1)
Basophils Relative: 0 %
EOS ABS: 0.1 10*3/uL (ref 0.0–0.7)
EOS PCT: 1 %
HCT: 37.2 % (ref 36.0–46.0)
Hemoglobin: 12.3 g/dL (ref 12.0–15.0)
LYMPHS ABS: 1.6 10*3/uL (ref 0.7–4.0)
LYMPHS PCT: 12 %
MCH: 30.7 pg (ref 26.0–34.0)
MCHC: 33.1 g/dL (ref 30.0–36.0)
MCV: 92.8 fL (ref 78.0–100.0)
MONO ABS: 0.8 10*3/uL (ref 0.1–1.0)
MONOS PCT: 6 %
Neutro Abs: 10.9 10*3/uL — ABNORMAL HIGH (ref 1.7–7.7)
Neutrophils Relative %: 81 %
PLATELETS: 293 10*3/uL (ref 150–400)
RBC: 4.01 MIL/uL (ref 3.87–5.11)
RDW: 14.2 % (ref 11.5–15.5)
WBC: 13.4 10*3/uL — ABNORMAL HIGH (ref 4.0–10.5)

## 2017-03-19 LAB — BASIC METABOLIC PANEL
Anion gap: 12 (ref 5–15)
BUN: 21 mg/dL — AB (ref 6–20)
CO2: 24 mmol/L (ref 22–32)
CREATININE: 0.86 mg/dL (ref 0.44–1.00)
Calcium: 9.4 mg/dL (ref 8.9–10.3)
Chloride: 101 mmol/L (ref 101–111)
GFR calc Af Amer: >60 mL/min (ref >60)
GFR, EST NON AFRICAN AMERICAN: 60 mL/min — AB (ref >60)
Glucose, Bld: 135 mg/dL — ABNORMAL HIGH (ref 65–99)
POTASSIUM: 3.5 mmol/L (ref 3.5–5.1)
Sodium: 137 mmol/L (ref 135–145)

## 2017-03-19 LAB — HEPATIC FUNCTION PANEL
ALBUMIN: 4 g/dL (ref 3.5–5.0)
ALK PHOS: 74 U/L (ref 38–126)
ALT: 24 U/L (ref 14–54)
AST: 23 U/L (ref 15–41)
BILIRUBIN TOTAL: 0.8 mg/dL (ref 0.3–1.2)
Total Protein: 7.1 g/dL (ref 6.5–8.1)

## 2017-03-19 LAB — PROTIME-INR
INR: 1.22
Prothrombin Time: 15.3 seconds — ABNORMAL HIGH (ref 11.4–15.2)

## 2017-03-19 IMAGING — CR DG CHEST 1V
1 series · 1 of 1 positions shown · non-contrast
Comparison: 04/20/2016

CLINICAL DATA: Pre-surgical imaging. History of diabetes and
hypertension.

EXAM:
CHEST 1 VIEW

[AP]
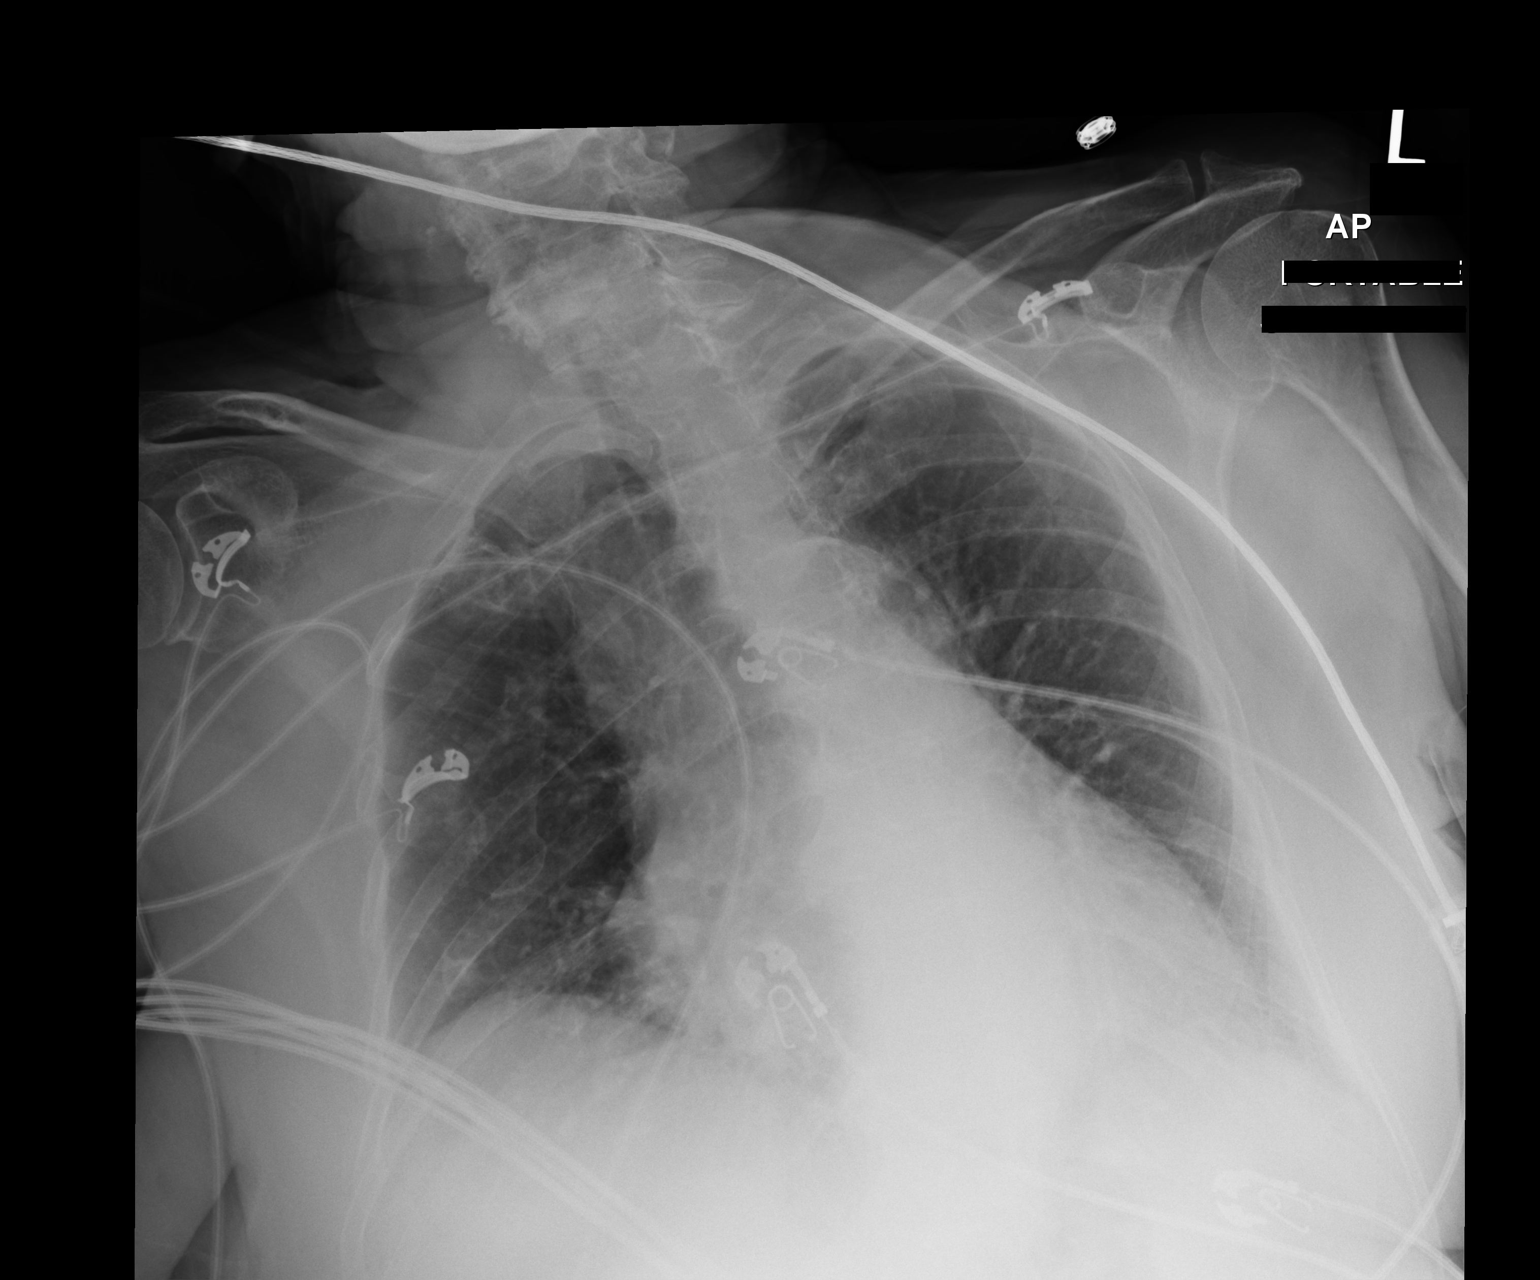

[1 of 1 positions shown; findings below may reference images not displayed]

FINDINGS: Shallow inspiration with some elevation of the right hemidiaphragm.
Probable atelectasis in the lung bases. No airspace disease or
consolidation is suggested. Peribronchial thickening consistent with
chronic bronchitis. No blunting of costophrenic angles. No
pneumothorax. Calcified and tortuous aorta. Degenerative changes in
the spine.
IMPRESSION: Shallow inspiration with probable atelectasis in the lung bases. No
evidence of active pulmonary disease. Aortic atherosclerosis.

## 2017-03-19 IMAGING — CT CT PELVIS W/O CM
2 of 3 series · 16 of 46 positions shown, 18 images · non-contrast
Comparison: Left hip radiographs.

CLINICAL DATA: Fall with left hip pain. Pelvic fracture known or
suspected.

EXAM:
CT PELVIS WITHOUT CONTRAST
TECHNIQUE: Multidetector CT imaging of the pelvis was performed following the
standard protocol without intravenous contrast.

[Series 5: pelvis 2.0 st · axial · 0.88mm/px · z∈[+1069,+1323]mm · 13 of 147 slices shown, 15 images]
[im 10/147  soft-tissue]
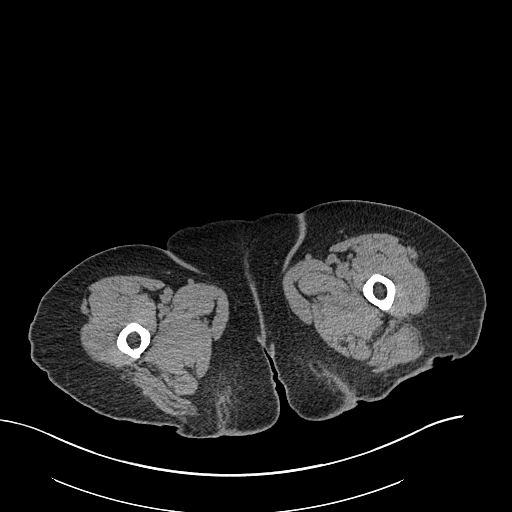
[im 10/147  bone]
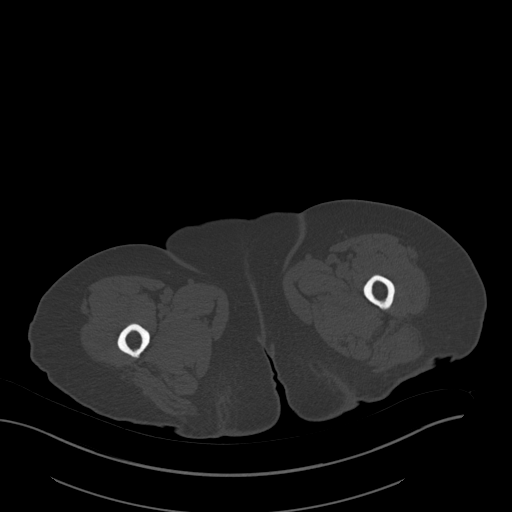
[im 19/147  soft-tissue]
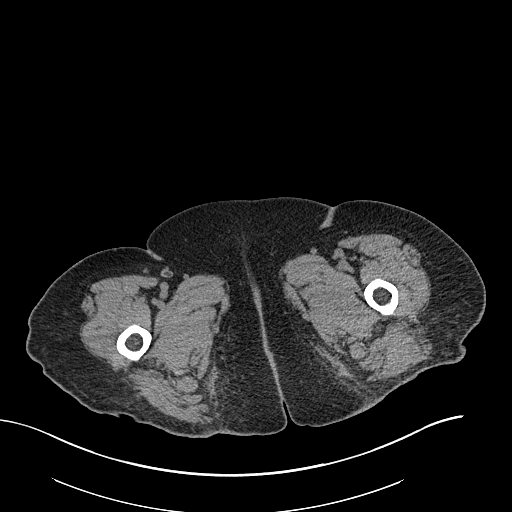
[im 29/147  soft-tissue]
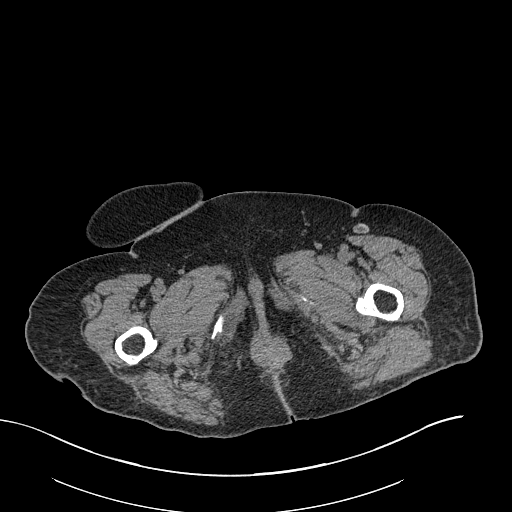
[im 43/147  soft-tissue]
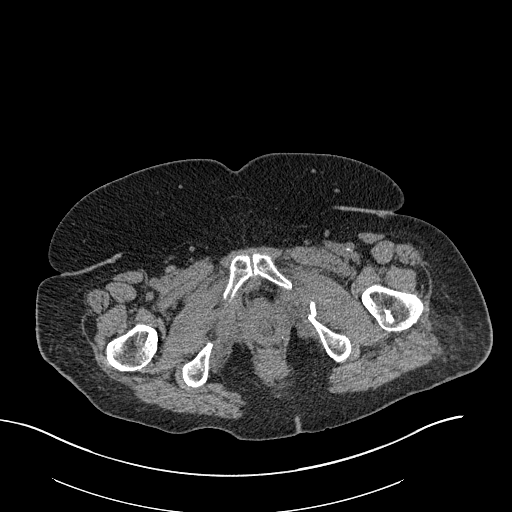
[im 52/147  soft-tissue]
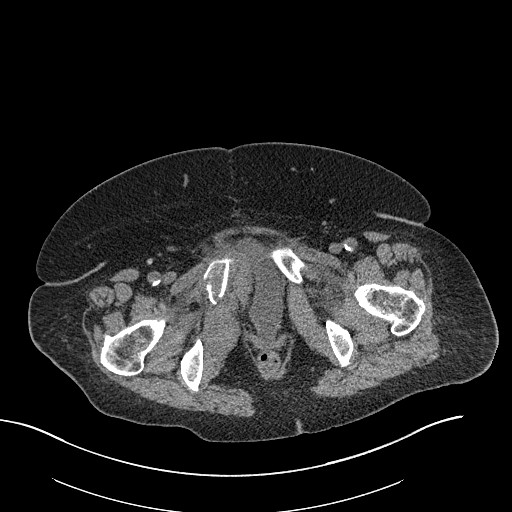
[im 62/147  soft-tissue]
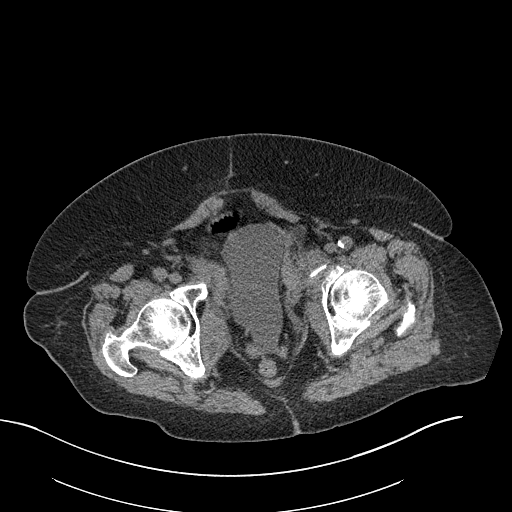
[im 76/147  soft-tissue]
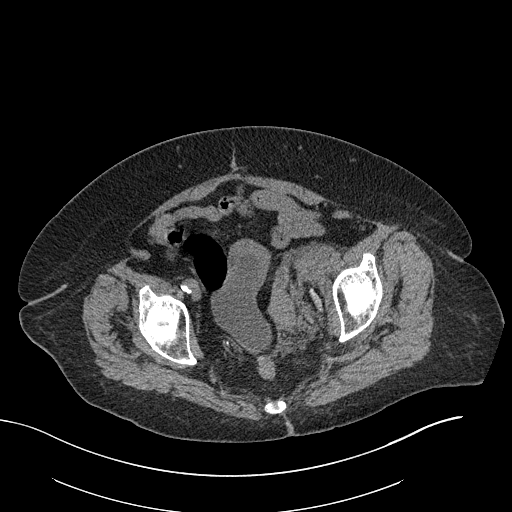
[im 85/147  soft-tissue]
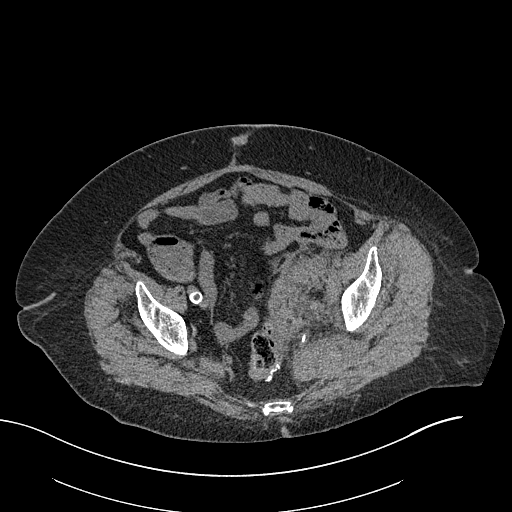
[im 95/147  soft-tissue]
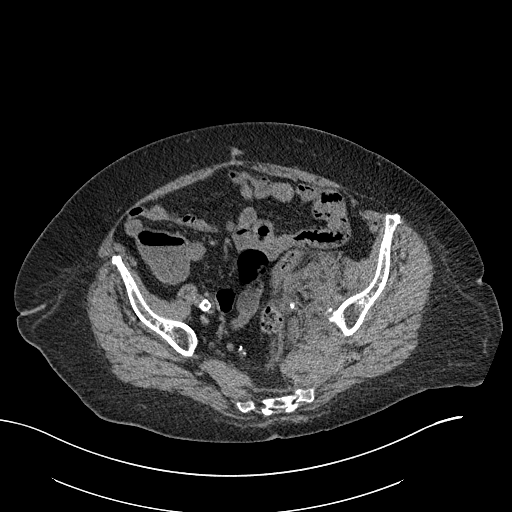
[im 95/147  bone]
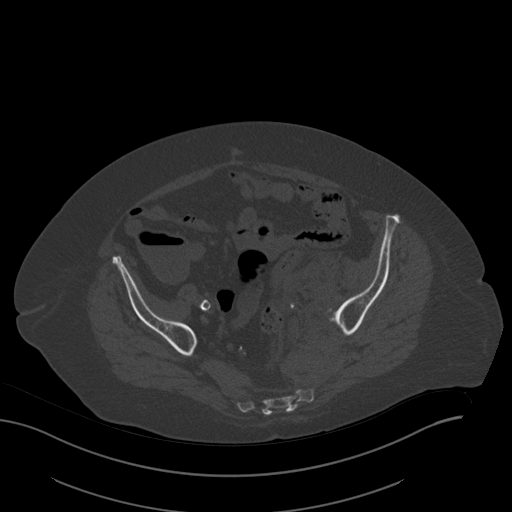
[im 104/147  soft-tissue]
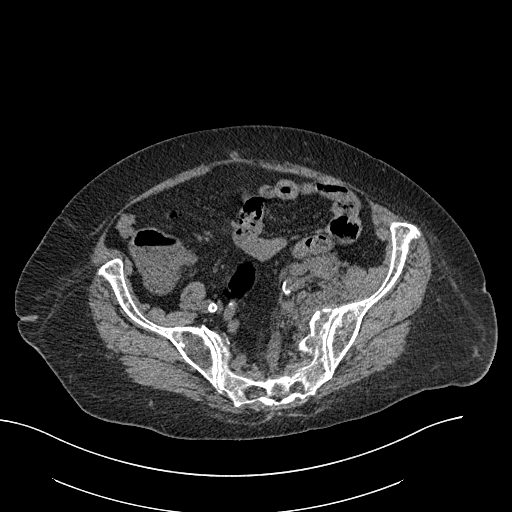
[im 118/147  soft-tissue]
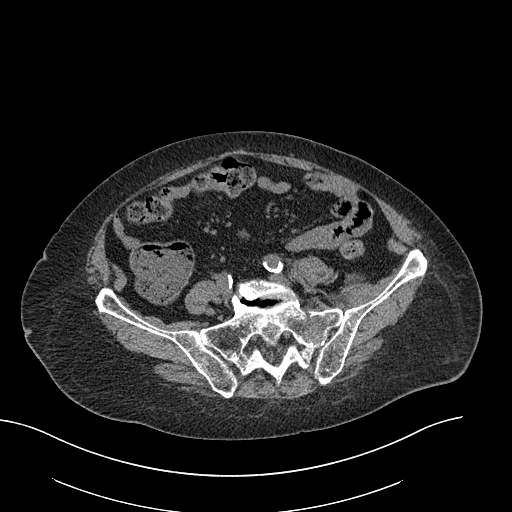
[im 128/147  soft-tissue]
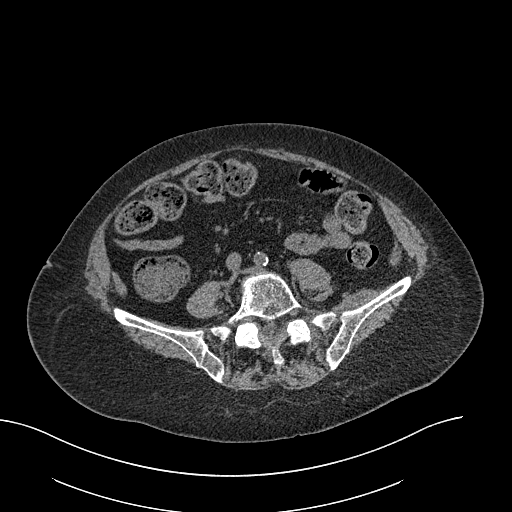
[im 137/147  soft-tissue]
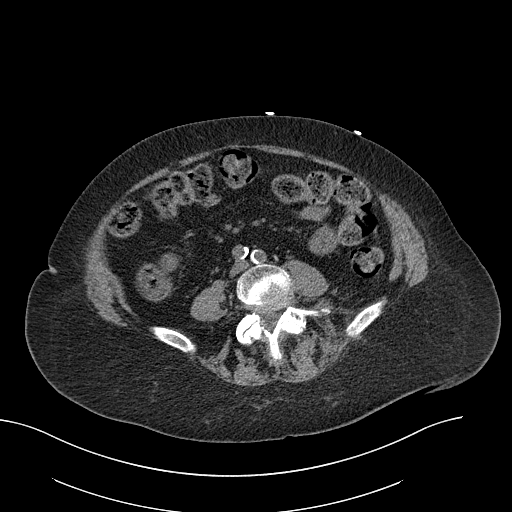

[Series 6: pelvis 2.0 cor. bone · coronal · 0.57mm/px · 3 of 106 slices shown]
[im 36/106  soft-tissue]
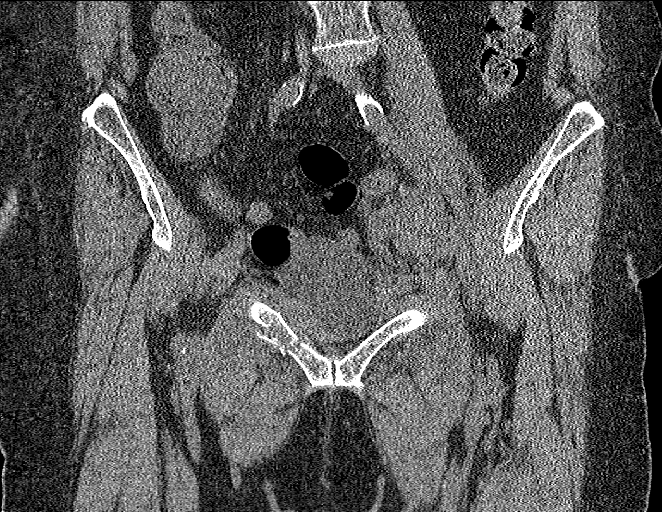
[im 47/106  soft-tissue]
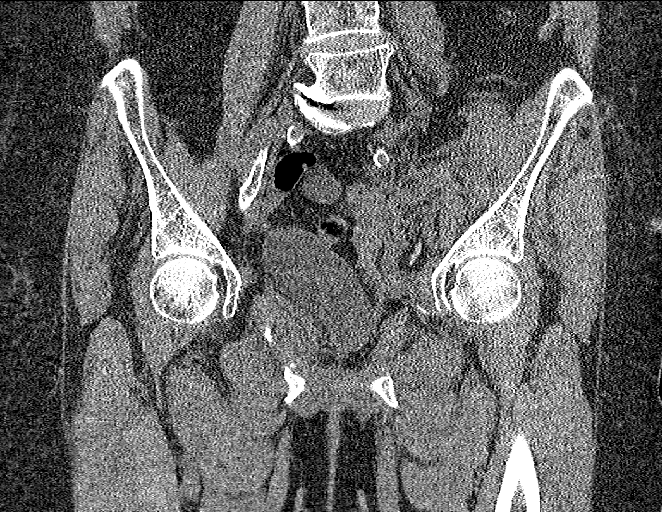
[im 59/106  soft-tissue]
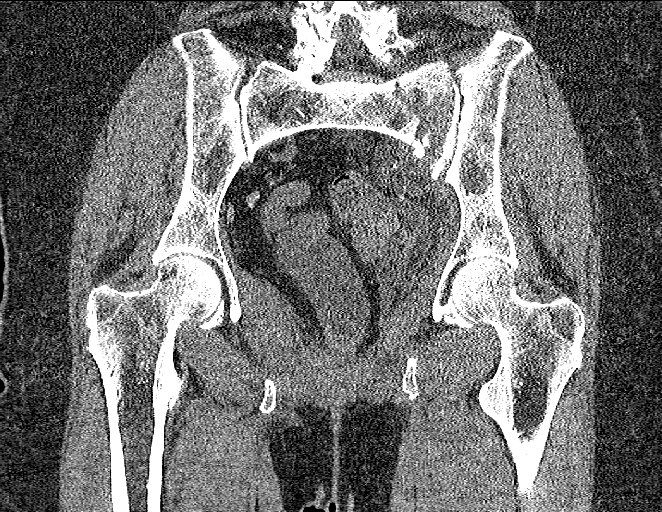

[16 of 46 positions shown; findings below may reference images not displayed]

FINDINGS: Urinary Tract: Urinary bladder is physiologically distended, no
bladder wall thickening.

Bowel: Enteric sutures in the sigmoid colon. Moderate colonic stool
burden in the included colon. No evidence of bowel inflammation.

Vascular/Lymphatic: Vascular calcifications without aneurysm.

Reproductive:  Post hysterectomy.  No adnexal mass.

Other: Pelvic hemorrhage related to pelvic fractures, greater on the
left. On the left this causes mild associated mass-effect on the
urinary bladder. Some hyperdense material in the region of the left
pelvic hemorrhage can be seen with active bleeding.

Musculoskeletal:

Left pelvis: Displaced left sacral fracture, zone 1 extending into
the sacroiliac joint. Fracture is lateral to the sacral foramen.
Fracture extends superiorly to involve the left L5 transverse
process. Superior pubic ramus fracture at the puboacetabular
junction, comminuted and mildly displaced. Displaced comminuted left
inferior pubic ramus fracture.

Right pelvis: Comminuted superior and inferior pubic rami fractures.
Fractures are minimally displaced. No definite right sacral
fracture.

Pubic symphysis remains congruent.  Proximal femora are intact.
IMPRESSION: 1. Displaced comminuted fractures of the left superior inferior
pubic ramus, superior ramus fracture at the puboacetabular junction.
Displaced left sacral fracture extending into the sacroiliac joint.
Left L5 transverse process fracture.
2. Displaced right superior and inferior pubic rami fractures. No
definite right sacral fracture.
3. Pelvic hemorrhage related to pelvic fractures, greater on the
left. Questionable active bleeding within the left pelvic
hemorrhage.
These results were called by telephone at the time of interpretation
on 03/19/2017 at [DATE] to PA WIBENS SEN , who verbally
acknowledged these results.

## 2017-03-19 IMAGING — CR DG HIP (WITH OR WITHOUT PELVIS) 2-3V*L*
3 series · 3 of 3 positions shown · non-contrast
Comparison: None.

CLINICAL DATA: Fell down the, left hip pain

EXAM:
DG HIP (WITH OR WITHOUT PELVIS) 2-3V LEFT

[pelvis ap]
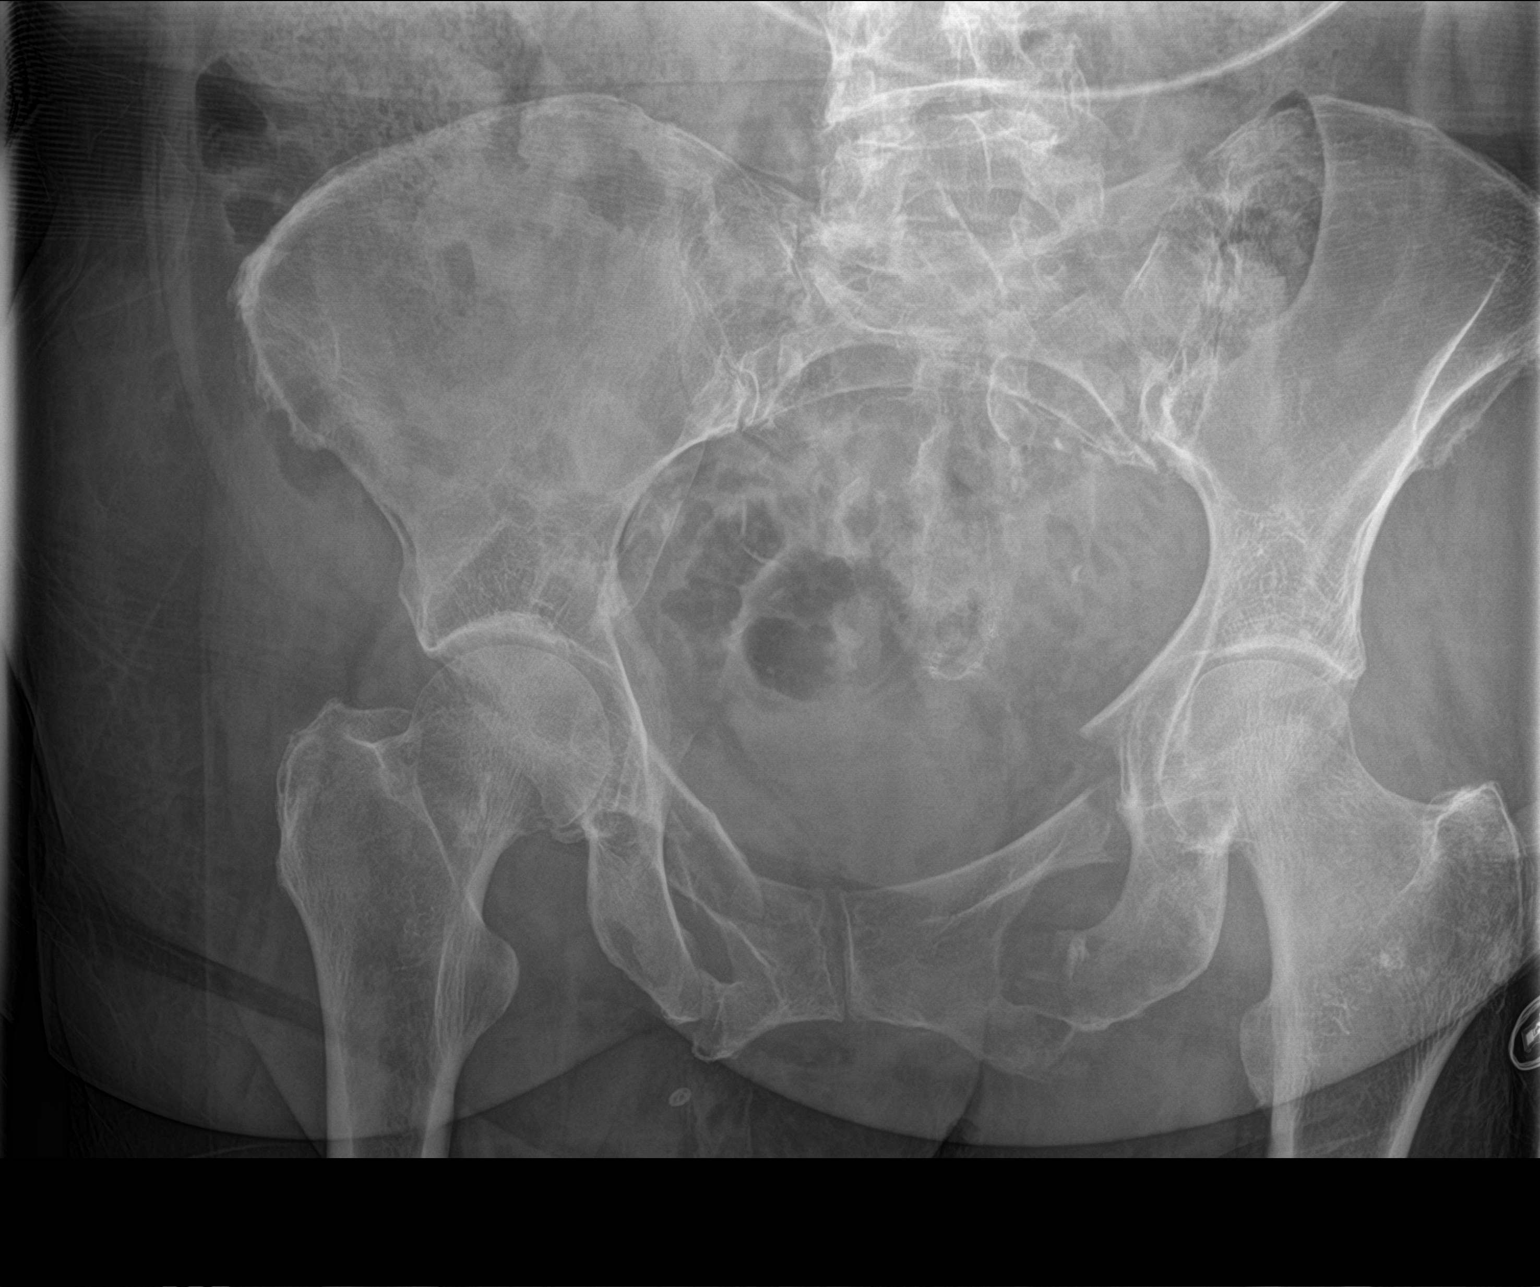

[hip ap]
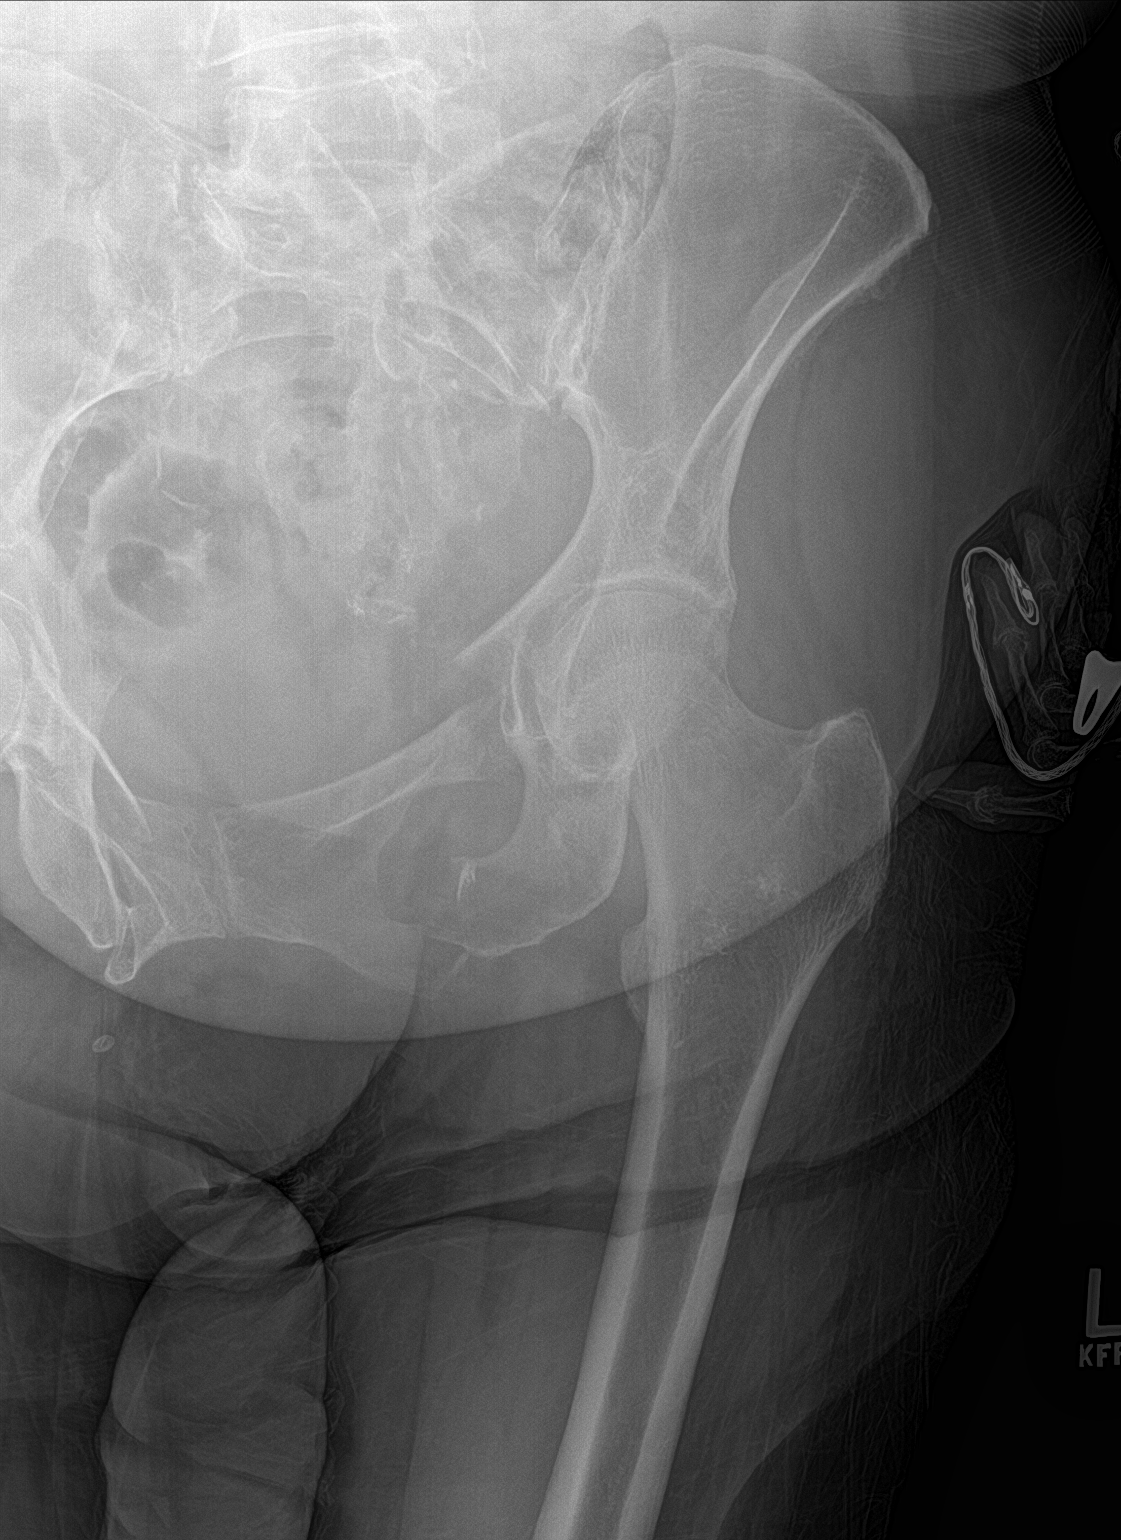

[hip lat]
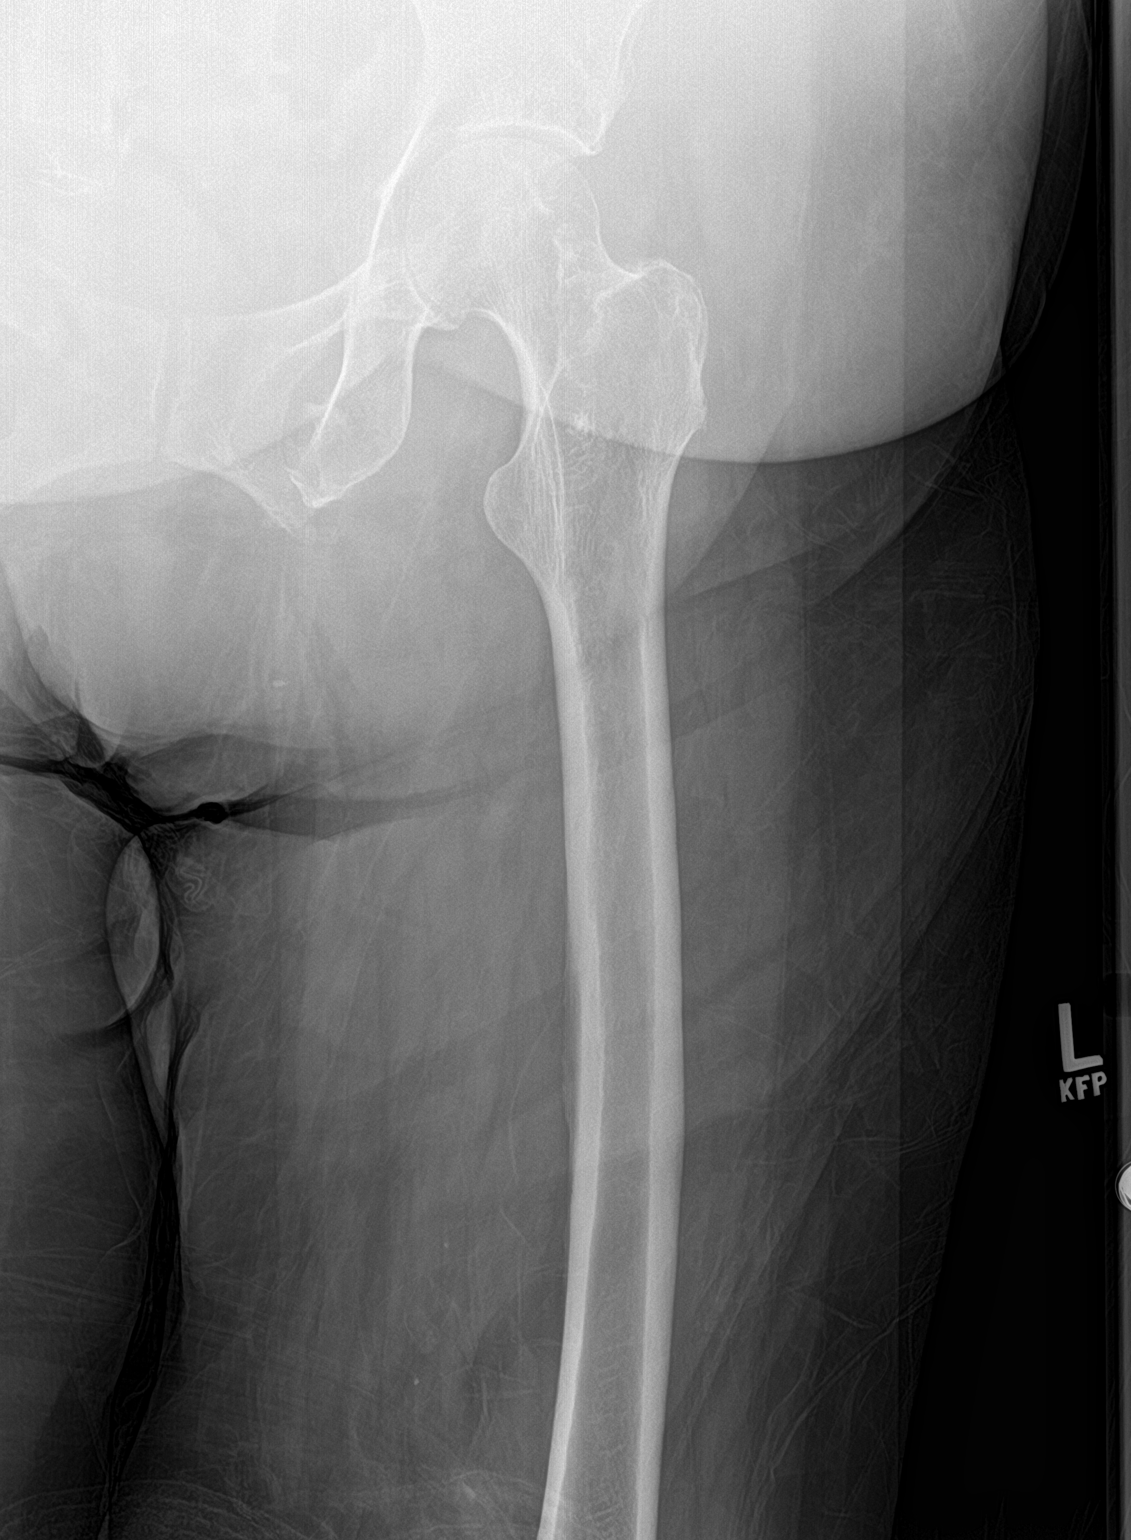

[3 of 3 positions shown; findings below may reference images not displayed]

FINDINGS: Pubic symphysis is intact. There are displaced right superior and
inferior pubic rami fractures. Possible vertically-oriented fracture
through the right aspect of the sacrum.

Superiorly displaced left inferior and superior pubic rami fractures
with additional fracture suspected through the left sacrum. There is
probably slight widening of the left SI joint.

Both femoral heads project in joint. Vascular calcifications.
Postsurgical suture in the pelvis
IMPRESSION: 1. Superiorly displaced left superior and inferior pubic rami with
probable vertically-oriented fracture through the left sacrum and
suspected mild widening of left SI joint, suspect for vertical shear
injury.
2. Displaced fractures involving the right superior and inferior
pubic rami with questionable fracture lucency in the right aspect of
sacrum.
Critical Value/emergent results were called by telephone at the time
of interpretation on 03/19/2017 at [DATE] to Dr. IMBRO BALIT , who
verbally acknowledged these results.

## 2017-03-19 MED ORDER — MORPHINE SULFATE (PF) 4 MG/ML IV SOLN
4.0000 mg | Freq: Once | INTRAVENOUS | Status: AC
  Administered 2017-03-19: 4 mg via INTRAVENOUS
  Filled 2017-03-19: qty 1

## 2017-03-19 MED ORDER — HYDROMORPHONE HCL 1 MG/ML IJ SOLN
0.5000 mg | Freq: Once | INTRAMUSCULAR | Status: AC
  Administered 2017-03-19: 0.5 mg via INTRAVENOUS
  Filled 2017-03-19: qty 1

## 2017-03-19 NOTE — ED Triage Notes (Signed)
Per EMS pt forgot something in her car and turned back and fell down 2 steps.  Pt has no neck or back pain. No loss consciousness.  Left hip pain. No obvious shortening or rotation. Pt is on coumadin for atrial fib.  No tenderness to palpation.  Increased pain with raising her leg.

## 2017-03-19 NOTE — ED Provider Notes (Signed)
[facility]-EMERGENCY DEPT Provider Note   CSN: [id number] Arrival date & time: 03/19/17  2000     History   Chief Complaint Chief Complaint  Patient presents with  . Fall    HPI Tracey Porter is a 83 y.o. female.  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 She was walking into her house at the top of the 3 brick steps and turned to return to her car, losing her balance and falling to the left side, hitting the concrete at the bottom step. She denies hitting her head. No chest pain, SOB, abdominal pain, nausea or back pain.    The history is provided by the patient. No language interpreter was used.  Fall  Pertinent negatives include no chest pain, no abdominal pain, no headaches and no shortness of breath.    Past Medical History:  Diagnosis Date  . Diabetes mellitus without complication (HCC)   . Hypertension     Patient Active Problem List   Diagnosis Date Noted  . Paroxysmal SVT (supraventricular tachycardia) (HCC)   . Essential hypertension   . Dehydration with hyponatremia 03/20/2015  . UTI (urinary tract infection) 03/20/2015  . Diarrhea 03/20/2015  . DM2 (diabetes mellitus, type 2) (HCC) 03/20/2015  . Hypokalemia 03/19/2015    Past Surgical History:  Procedure Laterality Date  . ABDOMINAL HYSTERECTOMY  1976  . EYE SURGERY      OB History    No data available       Home Medications    Prior to Admission medications   Medication Sig Start Date End Date Taking? Authorizing Provider  ACYCLOVIR PO Take 800 mg by mouth 2 (two) times daily.     [provider]  ALENDRONATE SODIUM PO Take 70 mg by mouth once a week.     [provider]  apixaban (ELIQUIS) 5 MG TABS tablet Take 1 tablet (5 mg total) by mouth 2 (two) times daily. Please cancel previous rx's sent; call pt with price before filling 02/24/17   Azalee Course, PA   atorvastatin (LIPITOR) 40 MG tablet Take 1 tablet (40 mg total) by mouth daily. 01/10/17   Lennette Bihari, MD  diltiazem (CARDIZEM CD) 360 MG 24 hr capsule Take 1 capsule (360 mg total) by mouth daily. 02/25/17 05/26/17  Lennette Bihari, MD  furosemide (LASIX) 20 MG tablet Take 1 tablet (20 mg total) by mouth daily. 02/08/17   Lennette Bihari, MD  labetalol (NORMODYNE) 300 MG tablet Take 1 tablet (300 mg total) by mouth 2 (two) times daily. Please cancel previous rx's sent 02/24/17   Azalee Course, PA  metFORMIN (GLUCOPHAGE) 500 MG tablet Take 500 mg by mouth daily with breakfast.    [provider]  nitroGLYCERIN (NITROSTAT) 0.4 MG SL tablet Place 1 tablet (0.4 mg total) under the tongue every 5 (five) minutes as needed for chest pain. 02/01/17 05/02/17  Azalee Course, PA  potassium chloride (K-DUR) 10 MEQ tablet Take 1 tablet (10 mEq total) by mouth daily. 03/10/17   Azalee Course, PA    Family History Family History  Problem Relation Age of Onset  . Lung cancer Father   . Lymphoma Daughter     Social History Social History  Substance Use Topics  . Smoking status: Former Games developer  . Smokeless tobacco: Never Used  . Alcohol use Yes     Allergies   Penicillins   Review of Systems  Review of Systems  Constitutional: Negative for diaphoresis and fever.  HENT: Negative.   Respiratory: Negative.  Negative for shortness of breath.   Cardiovascular: Negative.  Negative for chest pain.  Gastrointestinal: Negative.  Negative for abdominal pain and nausea.  Genitourinary: Positive for pelvic pain.  Musculoskeletal:       See HPI.  Skin: Negative.  Negative for wound.  Neurological: Negative.  Negative for syncope, weakness and headaches.     Physical Exam Updated Vital Signs There were no vitals taken for this visit.  Physical Exam  Constitutional: She is oriented to person, place, and time. She appears well-developed and well-nourished.  HENT:  Head: Normocephalic.  Neck: Normal  range of motion. Neck supple.  Cardiovascular: An irregularly irregular rhythm present. Tachycardia present.   No murmur heard. Pulmonary/Chest: Effort normal and breath sounds normal. She has no wheezes. She has no rales. She exhibits no tenderness.  Abdominal: Soft. Bowel sounds are normal. There is no tenderness. There is no rebound and no guarding.  Musculoskeletal: Normal range of motion.  No shortening or rotation of the left leg. Strong distal pulses. Posterior tenderness of the left hip. No spinal tenderness.   Neurological: She is alert and oriented to person, place, and time.  Skin: Skin is warm and dry. No rash noted.  Psychiatric: She has a normal mood and affect.     ED Treatments / Results  Labs (all labs ordered are listed, but only abnormal results are displayed) Labs Reviewed  CBC WITH DIFFERENTIAL/PLATELET - Abnormal; Notable for the following:       Result Value   WBC 13.4 (*)    Neutro Abs 10.9 (*)    All other components within normal limits  BASIC METABOLIC PANEL  PROTIME-INR    EKG  EKG Interpretation  Date/Time:  Saturday March 19 2017 20:09:43 EDT Ventricular Rate:  107 PR Interval:    QRS Duration: 87 QT Interval:  348 QTC Calculation: 414 R Axis:   69 Text Interpretation:  Atrial fibrillation new since 10/17 Confirmed by Marily MemosMesner, Jason [phone]([id number]) on 03/19/2017 8:44:33 PM       Radiology No results found.  Procedures Procedures (including critical care time)  Medications Ordered in ED Medications  morphine 4 MG/ML injection 4 mg (not administered)  morphine 4 MG/ML injection 4 mg (4 mg Intravenous Given 03/19/17 2038)     Initial Impression / Assessment and Plan / ED Course  I have reviewed the triage vital signs and the nursing notes.  Pertinent labs & imaging results that were available during my care of the patient were reviewed by me and considered in my medical decision making (see chart for details).     Patient presents after  fall from 3 steps onto left hip. No other injury or pain. She is on Coumadin for atrial fibrillation.   Initial plain film of hip and pelvis show unstable pelvic fracture. CT pending.   Plan: consult ortho, admit to medicine given coagulopathy. Pain control provided.   10:00 - CT results called from radiology and show likely active hemorrhage, greater on the left pelvic. INR subtherapeutic at 1.22. Update called to Dr. [name], orthopedics, who will review images and call back with care plan.   Per Dr. [name], recommends buck's traction (10 pounds), admission to medicine. After reviewing the films, he feels observation of the bleeding and patient's stability is appropriate. Discussed with [facility] who accepts for admission.   Final Clinical Impressions(s) / ED Diagnoses   Final diagnoses:  None   1. Pelvic fracture, unstable 2. 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

## 2017-03-19 NOTE — H&P (Signed)
Tracey Porter JXB:[id number] DOB: 1929/12/16 DOA: 03/19/2017     PCP: [place], Julie M, NP  [facility]: Dr.  Tresa Endo cardiology  Patient coming from:  home Lives alone,       Chief Complaint: Hip Pain after a fall  HPI: Tracey Porter is a 83 y.o. female with medical history significant of HTN, DM2, A.fib on Eliquis, HLD, Mild dementia, Macular degeneration    Presented with a mechanical fall that occurred today. Denies any loss of consciousness she was walking up the steps realized that she forgot something in the car turned around to get it lost balance and fell down 3 steps onto cement. Hitting her left side. She's been unable to bear weight. Denies any head injury , no LOC or she and shortness of breath or chest pain abdominal pain. Regarding pertinent Chronic problems: History of irregular heartbeat atrial fibrillation followed by cardiology rate controlled with diltiazem. 2-D echo Doppler study on 05/19/2016 which showed an EF of 65-70%.  She had normal wall motion.  There was grade 2 diastolic dysfunction. We'll request last dose was today this morning. There was mitral annular calcification with mild to moderate MR.  There was mild aortic valve stenosis with a peak gradient of 20 and mean gradient of 12 mmHg.  She had severe LA dilatation, moderate TR, and mild/moderate pulmonary hypertension with PA pressure 44 mm.  Diabetes well controled last HgA1c 5.8 in July 2018 on metformin  IN ER:  No data recorded.      on arrival  ED Triage Vitals [03/19/17 2045]  Enc Vitals Group     BP 132/82     Pulse Rate 71     Resp (!) 24     Temp      Temp src      SpO2 95 %     Weight      Height      Head Circumference      Peak Flow      Pain Score      Pain Loc      Pain Edu?      Excl. in GC?     Latest RR 22 95% HR 81 BP 140/82 WBC 13.4 Hg 12.3 Na 137 K 3.5 BUN 21  CT pelvis showing multiple displaced comminuted fractures of the  pelvis Following Medications were ordered in ER: Medications  morphine 4 MG/ML injection 4 mg (4 mg Intravenous Given 03/19/17 2038)  morphine 4 MG/ML injection 4 mg (4 mg Intravenous Given 03/19/17 2057)  HYDROmorphone (DILAUDID) injection 0.5 mg (0.5 mg Intravenous Given 03/19/17 2151)     ER provider discussed case with:  Orthopedics Dr. Eulah Pont Who recommends: Admit to medicine D Will evaluate tomorrow may need to go OR  by Monday states no acute indication for surgical interventions at tonight patient becomes unstable we'll need IR consult We'll see patient in consult in the morning   Hospitalist was called for admission for  unstable multiple pelvic fractures in a setting of mechanical fall with history of anticoagulation with current hemorrhage but hemodynamically stable  Review of Systems:    Pertinent positives include hip pain  Constitutional:  No weight loss, night sweats, Fevers, chills, fatigue, weight loss  HEENT:  No headaches, Difficulty swallowing,Tooth/dental problems,Sore throat,  No sneezing, itching, ear ache, nasal congestion, post nasal drip,  Cardio-vascular:  No chest pain, Orthopnea, PND, anasarca, dizziness, palpitations.no Bilateral lower extremity swelling  GI:  No heartburn, indigestion,  abdominal pain, nausea, vomiting, diarrhea, change in bowel habits, loss of appetite, melena, blood in stool, hematemesis Resp:  no shortness of breath at rest. No dyspnea on exertion, No excess mucus, no productive cough, No non-productive cough, No coughing up of blood.No change in color of mucus.No wheezing. Skin:  no rash or lesions. No jaundice GU:  no dysuria, change in color of urine, no urgency or frequency. No straining to urinate.  No flank pain.  Musculoskeletal:  No joint pain or no joint swelling. No decreased range of motion. No back pain.  Psych:  No change in mood or affect. No depression or anxiety. No memory loss.  Neuro: no localizing neurological  complaints, no tingling, no weakness, no double vision, no gait abnormality, no slurred speech, no confusion  As per HPI otherwise 10 point review of systems negative.   Past Medical History: Past Medical History:  Diagnosis Date  . Diabetes mellitus without complication (HCC)   . Hypertension    Past Surgical History:  Procedure Laterality Date  . ABDOMINAL HYSTERECTOMY  1976  . EYE SURGERY       Social History:  Ambulatory  independently     reports that she has quit smoking. She has never used smokeless tobacco. She reports that she drinks alcohol. She reports that she does not use drugs.  Allergies:   Allergies  Allergen Reactions  . Penicillins Rash       Family History:   Family History  Problem Relation Age of Onset  . Lung cancer Father   . Lymphoma Daughter     Medications: Prior to Admission medications   Medication Sig Start Date End Date Taking? Authorizing Provider  acyclovir (ZOVIRAX) 400 MG tablet Take 800 mg by mouth 2 (two) times daily. 03/04/17  Yes [provider]  alendronate (FOSAMAX) 70 MG tablet Take 70 mg by mouth once a week. 03/04/17  Yes [provider]  diltiazem (TIAZAC) 360 MG 24 hr capsule Take 360 mg by mouth daily. 02/25/17  Yes [provider]  metFORMIN (GLUCOPHAGE-XR) 500 MG 24 hr tablet Take 500 mg by mouth every morning. 03/04/17  Yes [provider]  MYRBETRIQ 25 MG TB24 tablet Take 25 mg by mouth daily. 03/04/17  Yes [provider]  prednisoLONE acetate (PRED FORTE) 1 % ophthalmic suspension  03/19/17  Yes [provider]  apixaban (ELIQUIS) 5 MG TABS tablet Take 1 tablet (5 mg total) by mouth 2 (two) times daily. Please cancel previous rx's sent; call pt with price before filling 02/24/17   Azalee CourseMeng, Hao, PA  atorvastatin (LIPITOR) 40 MG tablet Take 1 tablet (40 mg total) by mouth daily. 01/10/17   Lennette BihariKelly, Thomas A, MD  diltiazem (CARDIZEM CD) 360 MG 24 hr capsule Take 1 capsule (360 mg  total) by mouth daily. 02/25/17 05/26/17  Lennette BihariKelly, Thomas A, MD  furosemide (LASIX) 20 MG tablet Take 1 tablet (20 mg total) by mouth daily. 02/08/17   Lennette BihariKelly, Thomas A, MD  labetalol (NORMODYNE) 300 MG tablet Take 1 tablet (300 mg total) by mouth 2 (two) times daily. Please cancel previous rx's sent 02/24/17   Azalee CourseMeng, Hao, PA  nitroGLYCERIN (NITROSTAT) 0.4 MG SL tablet Place 1 tablet (0.4 mg total) under the tongue every 5 (five) minutes as needed for chest pain. 02/01/17 05/02/17  Azalee CourseMeng, Hao, PA  potassium chloride (K-DUR) 10 MEQ tablet Take 1 tablet (10 mEq total) by mouth daily. 03/10/17   Azalee CourseMeng, Hao, PA    Physical Exam: Patient Vitals  for the past 24 hrs:  BP Pulse Resp SpO2  03/19/17 2230 133/82 98 19 96 %  03/19/17 2215 136/72 (!) 102 19 95 %  03/19/17 2200 120/79 (!) 109 20 96 %  03/19/17 2130 (!) 137/94 99 (!) 21 95 %  03/19/17 2100 140/86 (!) 102 (!) 22 97 %  03/19/17 2045 132/82 71 (!) 24 95 %    1. General:  in No Acute distress  well -appearing 2. Psychological: Alert and   Oriented 3. Head/ENT:     Dry Mucous Membranes                          Head Non traumatic, neck supple                           Poor Dentition 4. SKIN:  decreased Skin turgor,  Skin clean Dry and intact no rash 5. Heart: Regular rate and rhythm systolic Murmur, no Rub or gallop 6. Lungs: no wheezes or crackles   7. Abdomen: Soft,  non-tender, Non distended  obese  bowel sounds present 8. Lower extremities: no clubbing, cyanosis, or edema 9. Neurologically Grossly intact, limited exam secondary to known pelvic fractures preserved sensation and lower extremities  10. MSK: Normal range of motion upper extremities body mass index is unknown because there is no height or weight on file.  Labs on Admission:   Labs on Admission: I have personally reviewed following labs and imaging studies  CBC:  Recent Labs Lab 03/19/17 2010  WBC 13.4*  NEUTROABS 10.9*  HGB 12.3  HCT 37.2  MCV 92.8  PLT 293   Basic  Metabolic Panel:  Recent Labs Lab 03/19/17 2010  NA 137  K 3.5  CL 101  CO2 24  GLUCOSE 135*  BUN 21*  CREATININE 0.86  CALCIUM 9.4   GFR: CrCl cannot be calculated (Unknown ideal weight.). Liver Function Tests:  Recent Labs Lab 03/19/17 2010  AST 23  ALT 24  ALKPHOS 74  BILITOT 0.8  PROT 7.1  ALBUMIN 4.0   No results for input(s): LIPASE, AMYLASE in the last 168 hours. No results for input(s): AMMONIA in the last 168 hours. Coagulation Profile:  Recent Labs Lab 03/19/17 2010  INR 1.22   Cardiac Enzymes: No results for input(s): CKTOTAL, CKMB, CKMBINDEX, TROPONINI in the last 168 hours. BNP (last 3 results) No results for input(s): PROBNP in the last 8760 hours. HbA1C: No results for input(s): HGBA1C in the last 72 hours. CBG: No results for input(s): GLUCAP in the last 168 hours. Lipid Profile: No results for input(s): CHOL, HDL, LDLCALC, TRIG, CHOLHDL, LDLDIRECT in the last 72 hours. Thyroid Function Tests: No results for input(s): TSH, T4TOTAL, FREET4, T3FREE, THYROIDAB in the last 72 hours. Anemia Panel: No results for input(s): VITAMINB12, FOLATE, FERRITIN, TIBC, IRON, RETICCTPCT in the last 72 hours. Urine analysis: Sepsis Labs: (procalcitonin:4,lacticidven:4) )No results found for this or any previous visit (from the past 240 hour(s)).     UA   ordered  No results found for: HGBA1C  CrCl cannot be calculated (Unknown ideal weight.).  BNP (last 3 results) No results for input(s): PROBNP in the last 8760 hours.   ECG REPORT  Independently reviewed Rate:107  Rhythm: A. fib with RVR ST&T Change: No acute ischemic changes   QTC 348  There were no vitals filed for this visit.   Cultures: No results found for: SDES, SPECREQUEST, CULT,  REPTSTATUS   Radiological Exams on Admission: Dg Chest 1 View  Result Date: 03/19/2017 CLINICAL DATA:  Pre-surgical imaging. History of diabetes and hypertension. EXAM: CHEST 1 VIEW COMPARISON:   04/20/2016 FINDINGS: Shallow inspiration with some elevation of the right hemidiaphragm. Probable atelectasis in the lung bases. No airspace disease or consolidation is suggested. Peribronchial thickening consistent with chronic bronchitis. No blunting of costophrenic angles. No pneumothorax. Calcified and tortuous aorta. Degenerative changes in the spine. IMPRESSION: Shallow inspiration with probable atelectasis in the lung bases. No evidence of active pulmonary disease. Aortic atherosclerosis. Electronically Signed   By: Burman Nieves M.D.   On: 03/19/2017 21:28   Ct Pelvis Wo Contrast  Result Date: 03/19/2017 CLINICAL DATA:  Fall with left hip pain. Pelvic fracture known or suspected. EXAM: CT PELVIS WITHOUT CONTRAST TECHNIQUE: Multidetector CT imaging of the pelvis was performed following the standard protocol without intravenous contrast. COMPARISON:  Left hip radiographs. FINDINGS: Urinary Tract: Urinary bladder is physiologically distended, no bladder wall thickening. Bowel: Enteric sutures in the sigmoid colon. Moderate colonic stool burden in the included colon. No evidence of bowel inflammation. Vascular/Lymphatic: Vascular calcifications without aneurysm. Reproductive:  Post hysterectomy.  No adnexal mass. Other: Pelvic hemorrhage related to pelvic fractures, greater on the left. On the left this causes mild associated mass-effect on the urinary bladder. Some hyperdense material in the region of the left pelvic hemorrhage can be seen with active bleeding. Musculoskeletal: Left pelvis: Displaced left sacral fracture, zone 1 extending into the sacroiliac joint. Fracture is lateral to the sacral foramen. Fracture extends superiorly to involve the left L5 transverse process. Superior pubic ramus fracture at the puboacetabular junction, comminuted and mildly displaced. Displaced comminuted left inferior pubic ramus fracture. Right pelvis: Comminuted superior and inferior pubic rami fractures. Fractures  are minimally displaced. No definite right sacral fracture. Pubic symphysis remains congruent.  Proximal femora are intact. IMPRESSION: 1. Displaced comminuted fractures of the left superior inferior pubic ramus, superior ramus fracture at the puboacetabular junction. Displaced left sacral fracture extending into the sacroiliac joint. Left L5 transverse process fracture. 2. Displaced right superior and inferior pubic rami fractures. No definite right sacral fracture. 3. Pelvic hemorrhage related to pelvic fractures, greater on the left. Questionable active bleeding within the left pelvic hemorrhage. These results were called by telephone at the time of interpretation on 03/19/2017 at 9:57 pm to PA [facility] , who verbally acknowledged these results. Electronically Signed   By: Rubye Oaks M.D.   On: 03/19/2017 21:58   Dg Hip Unilat With Pelvis 2-3 Views Left  Result Date: 03/19/2017 CLINICAL DATA:  Larey Seat down the, left hip pain EXAM: DG HIP (WITH OR WITHOUT PELVIS) 2-3V LEFT COMPARISON:  None. FINDINGS: Pubic symphysis is intact. There are displaced right superior and inferior pubic rami fractures. Possible vertically-oriented fracture through the right aspect of the sacrum. Superiorly displaced left inferior and superior pubic rami fractures with additional fracture suspected through the left sacrum. There is probably slight widening of the left SI joint. Both femoral heads project in joint. Vascular calcifications. Postsurgical suture in the pelvis IMPRESSION: 1. Superiorly displaced left superior and inferior pubic rami with probable vertically-oriented fracture through the left sacrum and suspected mild widening of left SI joint, suspect for vertical shear injury. 2. Displaced fractures involving the right superior and inferior pubic rami with questionable fracture lucency in the right aspect of sacrum. Critical Value/emergent results were called by telephone at the time of interpretation on 03/19/2017 at  9:00 pm to Dr.  JASON MESNER , who verbally acknowledged these results. Electronically Signed   By: Jasmine Pang M.D.   On: 03/19/2017 21:00    Chart has been reviewed    Assessment/Plan  83 y.o. female with medical history significant of HTN, DM2, A.fib on Eliquis, HLD, Mild dementia, Macular degeneration  Admitted for unstable multiple pelvic fractures in a setting of mechanical fall with history of anticoagulation with current hemorrhage but hemodynamically stable  Present on Admission: . Closed pelvic fracture (HCC) - orthopedics is aware, since evidence of hemorrhage on CT Will admit to step down monitor carefully if evidence of instability would be worrisome for persistent bleeding which case we'll need higher consult otherwise appear orthopedics expect bleeding to stop. Possible operative intervention on Monday. [facility] traction ordered Type and screen Ordered Serial CBC  . Atrial fibrillation with RVR (HCC) - hold diltiazem and labetalol to monitor for any evidence of hypotension. Restart when stable if develops significant tachycardia would initiate diltiazem drip with careful titration hold anticoagulation ER provided discussed with pharmacy given short half life for the course and hemodynamic stability no indication for reversal at this time we'll continue to monitor.   . Essential hypertension - Allow permissive hypertension for tonight   . Leukocytosis Most likely stress demarcation but will obtain UA . Multiple fractures of pelvis with unstable disruption of pelvic ring, initial encounter for closed fracture (HCC) -Appreciate orthopedics consult [facility] traction, strict bedrest most likely will need placement   diabetes mellitus order sliding scale hold metformin check hemoglobin A1c and CBC   Other plan as per orders.  DVT prophylaxis:  SCD   Code Status:  FULL CODE  as per patient   Family Communication:   Family not  at  Bedside   Disposition Plan:   likely will need  placement for rehabilitation                                                                         Consults called: Orthopedics Dr. Eulah Pont  Admission status:   inpatient     Level of care        SDU      I have spent a total of 65 min on this admission  Giavanni Zeitlin 03/20/2017, 12:36 AM    [facility]  Pager [phone]   after 2 AM please page floor coverage PA If 7AM-7PM, please contact the day team taking care of the patient  [email or web address]  Password [email or web address]

## 2017-03-19 NOTE — ED Notes (Signed)
Patient transported to X-ray 

## 2017-03-20 ENCOUNTER — Inpatient Hospital Stay (HOSPITAL_COMMUNITY): Payer: Medicare Other

## 2017-03-20 ENCOUNTER — Encounter (HOSPITAL_COMMUNITY): Payer: Self-pay | Admitting: Internal Medicine

## 2017-03-20 DIAGNOSIS — B9629 Other Escherichia coli [E. coli] as the cause of diseases classified elsewhere: Secondary | ICD-10-CM | POA: Diagnosis not present

## 2017-03-20 DIAGNOSIS — S36892A Contusion of other intra-abdominal organs, initial encounter: Secondary | ICD-10-CM | POA: Diagnosis present

## 2017-03-20 DIAGNOSIS — I4891 Unspecified atrial fibrillation: Secondary | ICD-10-CM | POA: Diagnosis present

## 2017-03-20 DIAGNOSIS — I272 Pulmonary hypertension, unspecified: Secondary | ICD-10-CM | POA: Diagnosis not present

## 2017-03-20 DIAGNOSIS — T45515A Adverse effect of anticoagulants, initial encounter: Secondary | ICD-10-CM | POA: Diagnosis present

## 2017-03-20 DIAGNOSIS — Z7901 Long term (current) use of anticoagulants: Secondary | ICD-10-CM | POA: Diagnosis present

## 2017-03-20 DIAGNOSIS — Z79899 Other long term (current) drug therapy: Secondary | ICD-10-CM | POA: Diagnosis not present

## 2017-03-20 DIAGNOSIS — Z1612 Extended spectrum beta lactamase (ESBL) resistance: Secondary | ICD-10-CM | POA: Diagnosis not present

## 2017-03-20 DIAGNOSIS — I471 Supraventricular tachycardia: Secondary | ICD-10-CM | POA: Diagnosis not present

## 2017-03-20 DIAGNOSIS — I5032 Chronic diastolic (congestive) heart failure: Secondary | ICD-10-CM | POA: Diagnosis present

## 2017-03-20 DIAGNOSIS — D689 Coagulation defect, unspecified: Secondary | ICD-10-CM | POA: Diagnosis present

## 2017-03-20 DIAGNOSIS — D72825 Bandemia: Secondary | ICD-10-CM | POA: Diagnosis not present

## 2017-03-20 DIAGNOSIS — Y92009 Unspecified place in unspecified non-institutional (private) residence as the place of occurrence of the external cause: Secondary | ICD-10-CM | POA: Diagnosis not present

## 2017-03-20 DIAGNOSIS — E785 Hyperlipidemia, unspecified: Secondary | ICD-10-CM | POA: Diagnosis present

## 2017-03-20 DIAGNOSIS — W109XXA Fall (on) (from) unspecified stairs and steps, initial encounter: Secondary | ICD-10-CM | POA: Diagnosis present

## 2017-03-20 DIAGNOSIS — S32811A Multiple fractures of pelvis with unstable disruption of pelvic ring, initial encounter for closed fracture: Secondary | ICD-10-CM | POA: Diagnosis present

## 2017-03-20 DIAGNOSIS — I700 Atherosclerosis of aorta: Secondary | ICD-10-CM | POA: Diagnosis present

## 2017-03-20 DIAGNOSIS — I2510 Atherosclerotic heart disease of native coronary artery without angina pectoris: Secondary | ICD-10-CM | POA: Diagnosis not present

## 2017-03-20 DIAGNOSIS — I482 Chronic atrial fibrillation: Secondary | ICD-10-CM | POA: Diagnosis not present

## 2017-03-20 DIAGNOSIS — W19XXXA Unspecified fall, initial encounter: Secondary | ICD-10-CM | POA: Diagnosis not present

## 2017-03-20 DIAGNOSIS — R0689 Other abnormalities of breathing: Secondary | ICD-10-CM | POA: Diagnosis not present

## 2017-03-20 DIAGNOSIS — D62 Acute posthemorrhagic anemia: Secondary | ICD-10-CM | POA: Diagnosis present

## 2017-03-20 DIAGNOSIS — I110 Hypertensive heart disease with heart failure: Secondary | ICD-10-CM | POA: Diagnosis present

## 2017-03-20 DIAGNOSIS — F0390 Unspecified dementia without behavioral disturbance: Secondary | ICD-10-CM | POA: Diagnosis present

## 2017-03-20 DIAGNOSIS — S32301A Unspecified fracture of right ilium, initial encounter for closed fracture: Secondary | ICD-10-CM | POA: Diagnosis not present

## 2017-03-20 DIAGNOSIS — H3530 Unspecified macular degeneration: Secondary | ICD-10-CM | POA: Diagnosis present

## 2017-03-20 DIAGNOSIS — N390 Urinary tract infection, site not specified: Secondary | ICD-10-CM | POA: Diagnosis not present

## 2017-03-20 DIAGNOSIS — I10 Essential (primary) hypertension: Secondary | ICD-10-CM | POA: Diagnosis not present

## 2017-03-20 DIAGNOSIS — I9589 Other hypotension: Secondary | ICD-10-CM | POA: Diagnosis not present

## 2017-03-20 DIAGNOSIS — J9811 Atelectasis: Secondary | ICD-10-CM | POA: Diagnosis present

## 2017-03-20 DIAGNOSIS — Z794 Long term (current) use of insulin: Secondary | ICD-10-CM | POA: Diagnosis not present

## 2017-03-20 DIAGNOSIS — Z880 Allergy status to penicillin: Secondary | ICD-10-CM | POA: Diagnosis not present

## 2017-03-20 DIAGNOSIS — J441 Chronic obstructive pulmonary disease with (acute) exacerbation: Secondary | ICD-10-CM | POA: Diagnosis present

## 2017-03-20 DIAGNOSIS — K567 Ileus, unspecified: Secondary | ICD-10-CM | POA: Diagnosis present

## 2017-03-20 DIAGNOSIS — R4781 Slurred speech: Secondary | ICD-10-CM | POA: Diagnosis not present

## 2017-03-20 DIAGNOSIS — Z7984 Long term (current) use of oral hypoglycemic drugs: Secondary | ICD-10-CM | POA: Diagnosis not present

## 2017-03-20 DIAGNOSIS — T794XXA Traumatic shock, initial encounter: Secondary | ICD-10-CM | POA: Diagnosis present

## 2017-03-20 DIAGNOSIS — E119 Type 2 diabetes mellitus without complications: Secondary | ICD-10-CM | POA: Diagnosis present

## 2017-03-20 DIAGNOSIS — Z90710 Acquired absence of both cervix and uterus: Secondary | ICD-10-CM | POA: Diagnosis not present

## 2017-03-20 DIAGNOSIS — R58 Hemorrhage, not elsewhere classified: Secondary | ICD-10-CM | POA: Diagnosis present

## 2017-03-20 DIAGNOSIS — I169 Hypertensive crisis, unspecified: Secondary | ICD-10-CM | POA: Diagnosis present

## 2017-03-20 DIAGNOSIS — I5033 Acute on chronic diastolic (congestive) heart failure: Secondary | ICD-10-CM | POA: Diagnosis not present

## 2017-03-20 DIAGNOSIS — I481 Persistent atrial fibrillation: Secondary | ICD-10-CM | POA: Diagnosis present

## 2017-03-20 DIAGNOSIS — Z87891 Personal history of nicotine dependence: Secondary | ICD-10-CM | POA: Diagnosis not present

## 2017-03-20 DIAGNOSIS — D72829 Elevated white blood cell count, unspecified: Secondary | ICD-10-CM | POA: Diagnosis present

## 2017-03-20 LAB — COMPREHENSIVE METABOLIC PANEL
ALK PHOS: 61 U/L (ref 38–126)
ALT: 22 U/L (ref 14–54)
AST: 24 U/L (ref 15–41)
Albumin: 3.5 g/dL (ref 3.5–5.0)
Anion gap: 11 (ref 5–15)
BUN: 21 mg/dL — AB (ref 6–20)
CALCIUM: 8.9 mg/dL (ref 8.9–10.3)
CHLORIDE: 101 mmol/L (ref 101–111)
CO2: 24 mmol/L (ref 22–32)
CREATININE: 0.98 mg/dL (ref 0.44–1.00)
GFR calc non Af Amer: 51 mL/min — ABNORMAL LOW (ref >60)
GFR, EST AFRICAN AMERICAN: 59 mL/min — AB (ref >60)
GLUCOSE: 239 mg/dL — AB (ref 65–99)
Potassium: 4.1 mmol/L (ref 3.5–5.1)
SODIUM: 136 mmol/L (ref 135–145)
Total Bilirubin: 1 mg/dL (ref 0.3–1.2)
Total Protein: 6.6 g/dL (ref 6.5–8.1)

## 2017-03-20 LAB — CBC
HCT: 31.7 % — ABNORMAL LOW (ref 36.0–46.0)
HCT: 27 % — ABNORMAL LOW (ref 36.0–46.0)
HCT: 25.6 % — ABNORMAL LOW (ref 36.0–46.0)
HCT: 31.3 % — ABNORMAL LOW (ref 36.0–46.0)
HEMOGLOBIN: 8.9 g/dL — AB (ref 12.0–15.0)
HEMOGLOBIN: 8.3 g/dL — AB (ref 12.0–15.0)
Hemoglobin: 10.6 g/dL — ABNORMAL LOW (ref 12.0–15.0)
Hemoglobin: 10.2 g/dL — ABNORMAL LOW (ref 12.0–15.0)
MCH: 31.1 pg (ref 26.0–34.0)
MCH: 30.8 pg (ref 26.0–34.0)
MCH: 30.5 pg (ref 26.0–34.0)
MCH: 29.5 pg (ref 26.0–34.0)
MCHC: 33.4 g/dL (ref 30.0–36.0)
MCHC: 33 g/dL (ref 30.0–36.0)
MCHC: 32.4 g/dL (ref 30.0–36.0)
MCHC: 32.6 g/dL (ref 30.0–36.0)
MCV: 93 fL (ref 78.0–100.0)
MCV: 93.4 fL (ref 78.0–100.0)
MCV: 94.1 fL (ref 78.0–100.0)
MCV: 90.5 fL (ref 78.0–100.0)
PLATELETS: 299 10*3/uL (ref 150–400)
PLATELETS: 213 10*3/uL (ref 150–400)
Platelets: 256 10*3/uL (ref 150–400)
Platelets: 264 10*3/uL (ref 150–400)
RBC: 3.41 MIL/uL — AB (ref 3.87–5.11)
RBC: 2.89 MIL/uL — ABNORMAL LOW (ref 3.87–5.11)
RBC: 2.72 MIL/uL — AB (ref 3.87–5.11)
RBC: 3.46 MIL/uL — AB (ref 3.87–5.11)
RDW: 14.6 % (ref 11.5–15.5)
RDW: 14.2 % (ref 11.5–15.5)
RDW: 14.4 % (ref 11.5–15.5)
RDW: 14.8 % (ref 11.5–15.5)
WBC: 17.3 10*3/uL — ABNORMAL HIGH (ref 4.0–10.5)
WBC: 15.1 10*3/uL — AB (ref 4.0–10.5)
WBC: 16.6 10*3/uL — ABNORMAL HIGH (ref 4.0–10.5)
WBC: 19.2 10*3/uL — AB (ref 4.0–10.5)

## 2017-03-20 LAB — URINALYSIS, ROUTINE W REFLEX MICROSCOPIC
BILIRUBIN URINE: NEGATIVE
GLUCOSE, UA: NEGATIVE mg/dL
Hgb urine dipstick: NEGATIVE
KETONES UR: 5 mg/dL — AB
NITRITE: POSITIVE — AB
PH: 5 (ref 5.0–8.0)
Protein, ur: NEGATIVE mg/dL
SPECIFIC GRAVITY, URINE: 1.018 (ref 1.005–1.030)

## 2017-03-20 LAB — TSH: TSH: 3.41 u[IU]/mL (ref 0.350–4.500)

## 2017-03-20 LAB — SURGICAL PCR SCREEN
MRSA, PCR: NEGATIVE
Staphylococcus aureus: NEGATIVE

## 2017-03-20 LAB — MAGNESIUM: Magnesium: 1.5 mg/dL — ABNORMAL LOW (ref 1.7–2.4)

## 2017-03-20 LAB — GLUCOSE, CAPILLARY
GLUCOSE-CAPILLARY: 170 mg/dL — AB (ref 65–99)
GLUCOSE-CAPILLARY: 161 mg/dL — AB (ref 65–99)
GLUCOSE-CAPILLARY: 170 mg/dL — AB (ref 65–99)
Glucose-Capillary: 206 mg/dL — ABNORMAL HIGH (ref 65–99)
Glucose-Capillary: 164 mg/dL — ABNORMAL HIGH (ref 65–99)
Glucose-Capillary: 156 mg/dL — ABNORMAL HIGH (ref 65–99)
Glucose-Capillary: 145 mg/dL — ABNORMAL HIGH (ref 65–99)

## 2017-03-20 LAB — PREPARE RBC (CROSSMATCH)

## 2017-03-20 LAB — PHOSPHORUS: Phosphorus: 4.5 mg/dL (ref 2.5–4.6)

## 2017-03-20 LAB — ABO/RH: ABO/RH(D): A POS

## 2017-03-20 IMAGING — CT CT PELVIS W/O CM
2 of 5 series · 15 of 46 positions shown, 17 images · non-contrast
Comparison: Yesterday

CLINICAL DATA: Follow up pelvic fracture. Initial encounter.

EXAM:
CT PELVIS WITHOUT CONTRAST
TECHNIQUE: Multidetector CT imaging of the pelvis was performed following the
standard protocol without intravenous contrast.

[Series 5: bone · axial · 0.87mm/px · z∈[-1011,-746]mm · 12 of 61 slices shown, 14 images]
[im 4/61  soft-tissue]
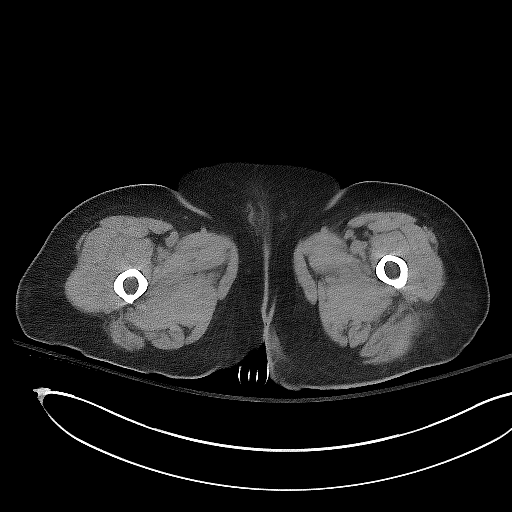
[im 4/61  bone]
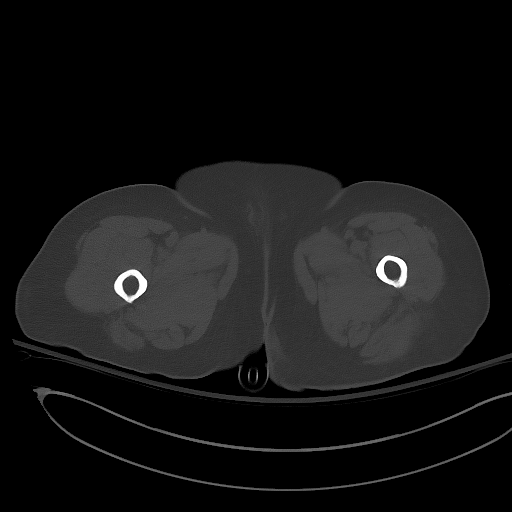
[im 8/61  soft-tissue]
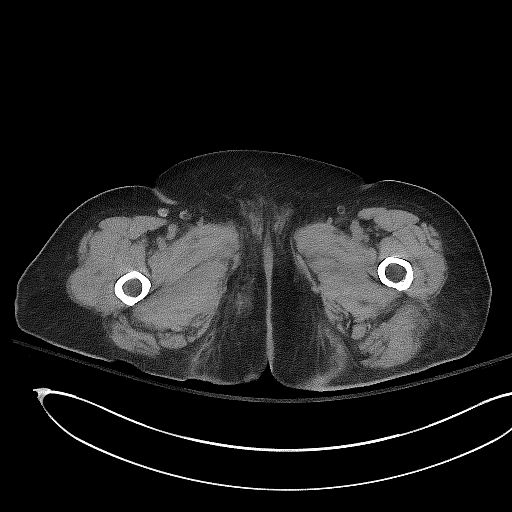
[im 14/61  soft-tissue]
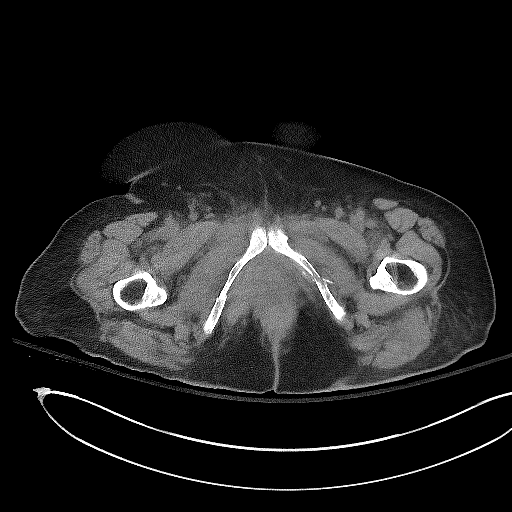
[im 18/61  soft-tissue]
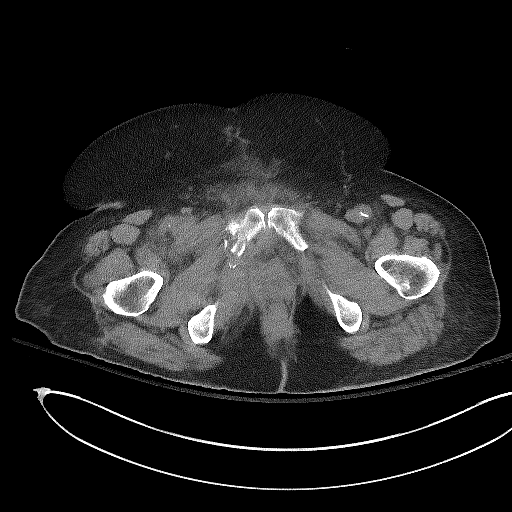
[im 24/61  soft-tissue]
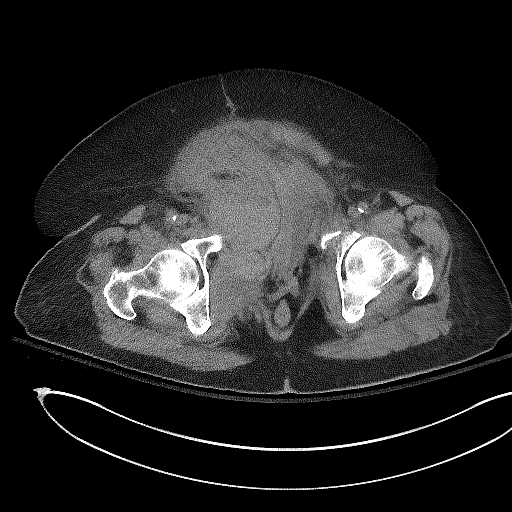
[im 28/61  soft-tissue]
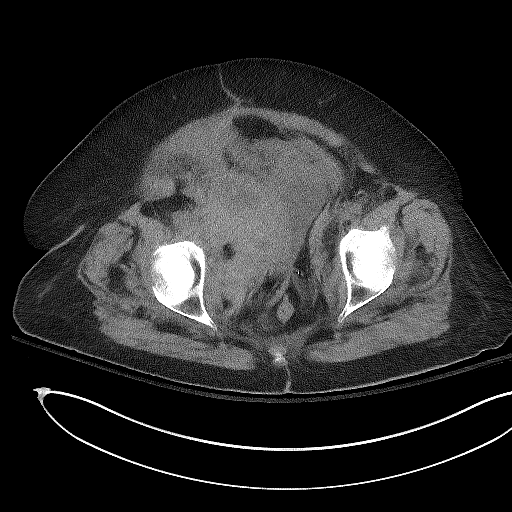
[im 33/61  soft-tissue]
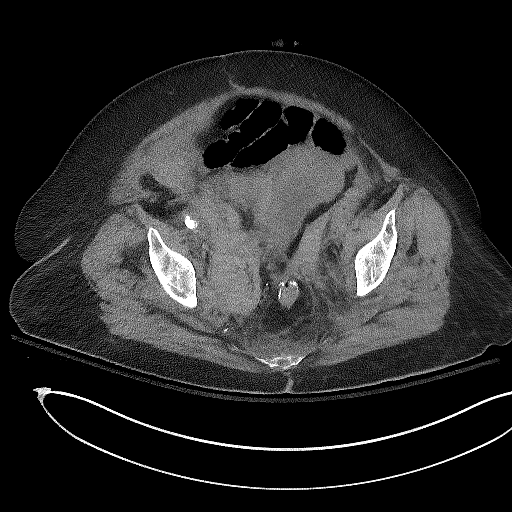
[im 37/61  soft-tissue]
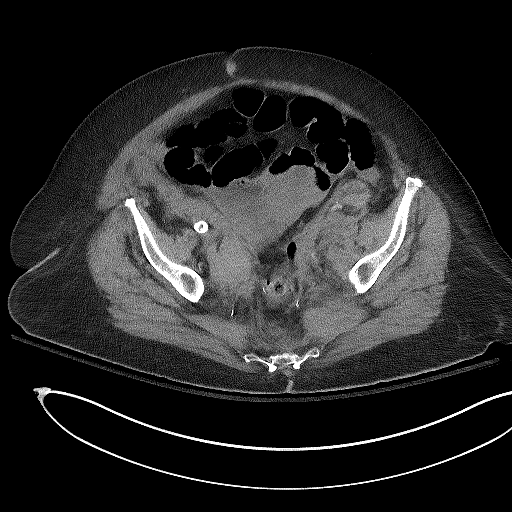
[im 43/61  soft-tissue]
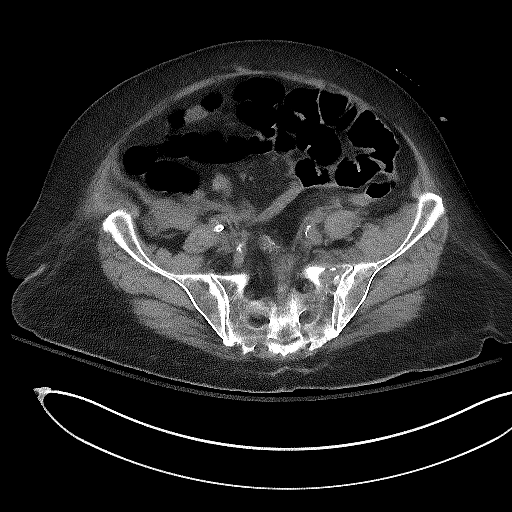
[im 43/61  bone]
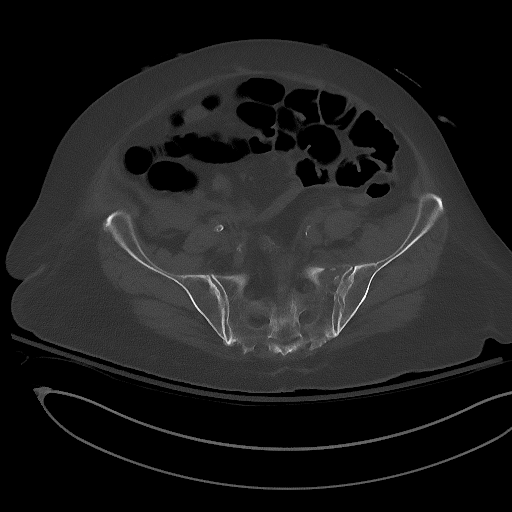
[im 47/61  soft-tissue]
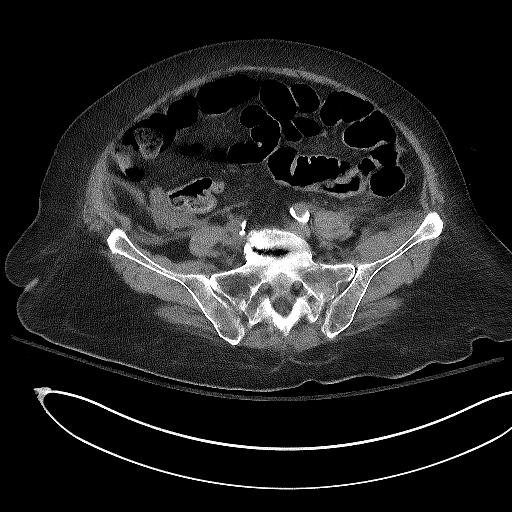
[im 53/61  soft-tissue]
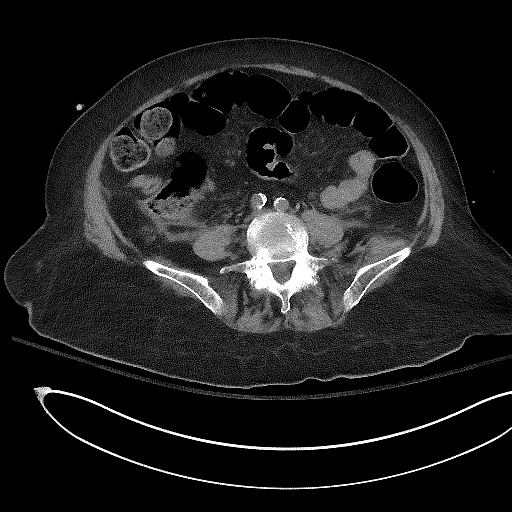
[im 57/61  soft-tissue]
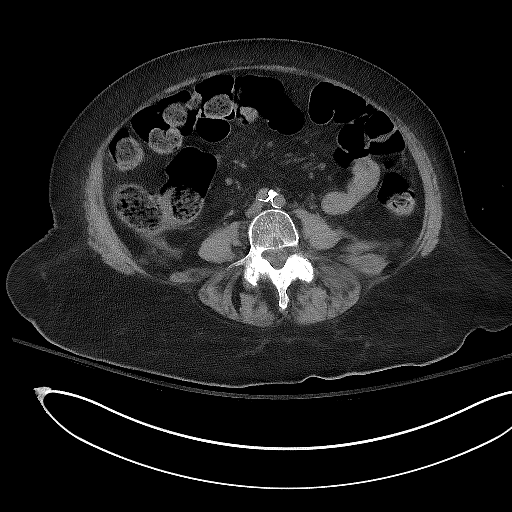

[Series 6: cor st · coronal · 0.60mm/px · 3 of 161 slices shown]
[im 41/161  soft-tissue]
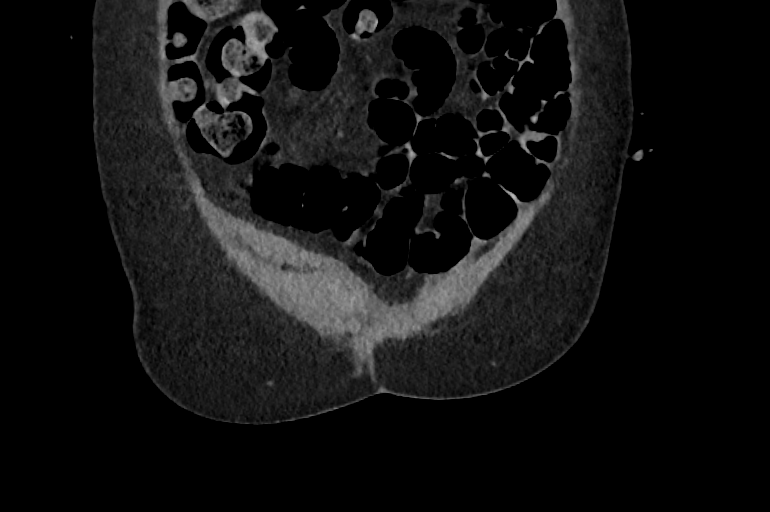
[im 81/161  soft-tissue]
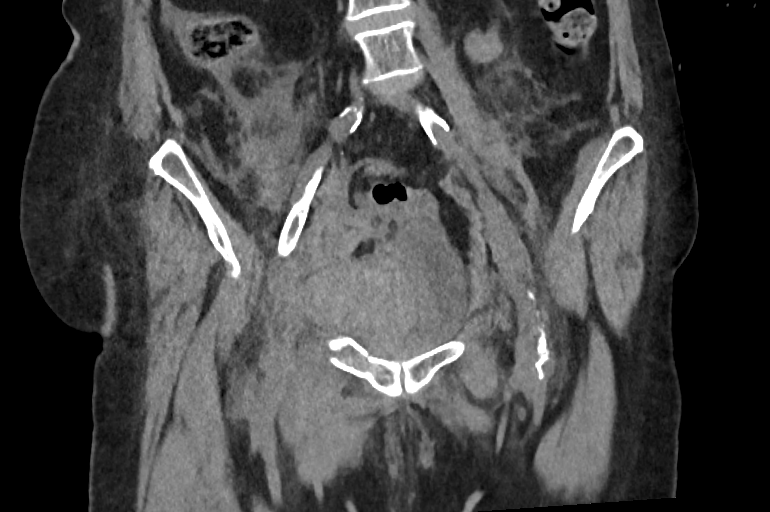
[im 121/161  soft-tissue]
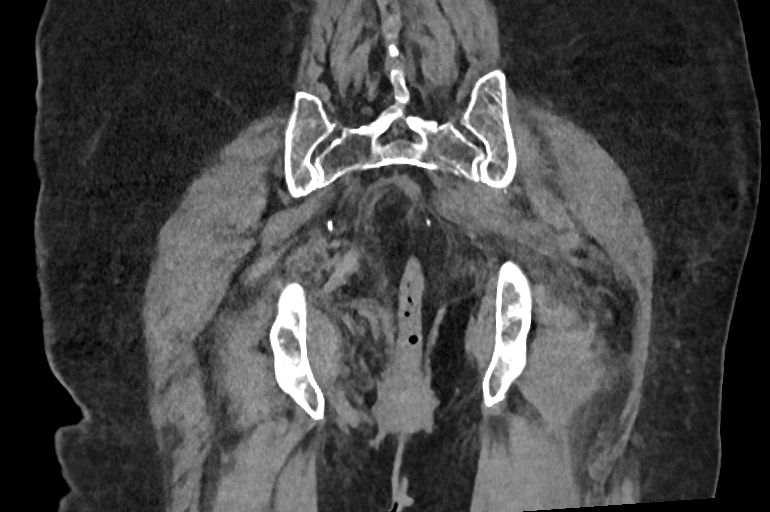

[15 of 46 positions shown; findings below may reference images not displayed]

FINDINGS: Urinary Tract: The bladder is displaced to the left and partially
effaced.

Bowel: Postoperative rectosigmoid junction for uncertain indication.
No visible bowel injury.

Vascular/Lymphatic: Bilateral pelvic hematoma, significantly
increased on the right compared to prior, where there is tracking
into the lower abdominal extraperitoneal space. Right pelvic
sidewall hematoma measures up to 12 cm by 8 cm on axial slices. The
left pelvic high-density areas noted on previous CT are likely lymph
nodes given their stable density and shape.

There is atherosclerotic calcification. The shape of intimal plaque
within the right common iliac artery may reflect a chronic
dissection.

Reproductive:  Hysterectomy.  Pelvic floor laxity.

Other:  No pneumoperitoneum or definite peritoneal fluid.

Musculoskeletal: Left obturator ring fractures through the mid
inferior pubic ramus at the puboacetabular junction. There is left
sacral ala fracture extending to the sacroiliac joint inferiorly; no
displacement along the sacral foramina. Segmental fracturing of the
right inferior pubic ramus and single fracture in the right superior
pubic ramus. No pelvic diastasis. No proximal hip fracture or
dislocation.

L5 left transverse process fracture.

Lower lumbar disc and facet degeneration with grade 1 L4-5
anterolisthesis.

Critical Value/emergent results were called by telephone at the time
of interpretation on 03/20/2017 at [DATE] to Dr. LONNIE JUARBE , who
verbally acknowledged these results.
IMPRESSION: 1. Large extraperitoneal pelvic hematoma, especially on the right,
significantly increased from previous scan. Right-sided hematoma
measures up to 12 x 8 cm.
2. Bilateral obturator ring, left L5 transverse process, and left
sacral ala fractures as described.
3. Atherosclerosis. Intimal plaque distortion at the right common
and external iliac suggests remote dissection.

## 2017-03-20 MED ORDER — MAGNESIUM SULFATE 2 GM/50ML IV SOLN
2.0000 g | Freq: Once | INTRAVENOUS | Status: AC
  Administered 2017-03-20: 2 g via INTRAVENOUS
  Filled 2017-03-20: qty 50

## 2017-03-20 MED ORDER — INSULIN ASPART 100 UNIT/ML ~~LOC~~ SOLN
0.0000 [IU] | SUBCUTANEOUS | Status: DC
  Administered 2017-03-20: 3 [IU] via SUBCUTANEOUS
  Administered 2017-03-20 (×2): 2 [IU] via SUBCUTANEOUS
  Administered 2017-03-20: 1 [IU] via SUBCUTANEOUS
  Administered 2017-03-20 (×2): 2 [IU] via SUBCUTANEOUS
  Administered 2017-03-21: 1 [IU] via SUBCUTANEOUS
  Administered 2017-03-21 (×3): 2 [IU] via SUBCUTANEOUS
  Administered 2017-03-22 (×2): 1 [IU] via SUBCUTANEOUS
  Administered 2017-03-22 (×3): 2 [IU] via SUBCUTANEOUS
  Administered 2017-03-22: 1 [IU] via SUBCUTANEOUS
  Administered 2017-03-23: 2 [IU] via SUBCUTANEOUS
  Administered 2017-03-23 (×2): 1 [IU] via SUBCUTANEOUS
  Administered 2017-03-23: 2 [IU] via SUBCUTANEOUS
  Administered 2017-03-24 – 2017-03-25 (×6): 1 [IU] via SUBCUTANEOUS
  Administered 2017-03-25: 2 [IU] via SUBCUTANEOUS
  Administered 2017-03-25: 1 [IU] via SUBCUTANEOUS
  Administered 2017-03-25 (×4): 2 [IU] via SUBCUTANEOUS
  Administered 2017-03-26: 1 [IU] via SUBCUTANEOUS
  Administered 2017-03-26 (×5): 2 [IU] via SUBCUTANEOUS
  Administered 2017-03-27 – 2017-03-28 (×6): 1 [IU] via SUBCUTANEOUS
  Administered 2017-03-28: 2 [IU] via SUBCUTANEOUS
  Administered 2017-03-28 – 2017-03-29 (×2): 1 [IU] via SUBCUTANEOUS

## 2017-03-20 MED ORDER — HYDROCODONE-ACETAMINOPHEN 5-325 MG PO TABS
1.0000 | ORAL_TABLET | ORAL | Status: DC | PRN
  Administered 2017-03-20: 1 via ORAL
  Administered 2017-03-20: 2 via ORAL
  Administered 2017-03-20: 1 via ORAL
  Administered 2017-03-20: 2 via ORAL
  Administered 2017-03-21 – 2017-03-22 (×3): 1 via ORAL
  Filled 2017-03-20 (×2): qty 1
  Filled 2017-03-20: qty 2
  Filled 2017-03-20: qty 1
  Filled 2017-03-20: qty 2
  Filled 2017-03-20 (×2): qty 1

## 2017-03-20 MED ORDER — ATORVASTATIN CALCIUM 40 MG PO TABS
40.0000 mg | ORAL_TABLET | Freq: Every day | ORAL | Status: DC
  Administered 2017-03-20 – 2017-03-31 (×12): 40 mg via ORAL
  Filled 2017-03-20 (×12): qty 1

## 2017-03-20 MED ORDER — SODIUM CHLORIDE 0.9 % IV SOLN
INTRAVENOUS | Status: DC
  Administered 2017-03-20 (×2): via INTRAVENOUS

## 2017-03-20 MED ORDER — SODIUM CHLORIDE 0.9 % IV SOLN
INTRAVENOUS | Status: DC
  Administered 2017-03-20: 21:00:00 via INTRAVENOUS

## 2017-03-20 MED ORDER — ONDANSETRON HCL 4 MG PO TABS
4.0000 mg | ORAL_TABLET | Freq: Four times a day (QID) | ORAL | Status: DC | PRN
  Administered 2017-03-21: 4 mg via ORAL
  Filled 2017-03-20: qty 1

## 2017-03-20 MED ORDER — MORPHINE SULFATE (PF) 4 MG/ML IV SOLN: 2.0000 mg | INTRAVENOUS | Status: DC | PRN

## 2017-03-20 MED ORDER — SENNA 8.6 MG PO TABS
1.0000 | ORAL_TABLET | Freq: Two times a day (BID) | ORAL | Status: DC
  Administered 2017-03-20 – 2017-03-28 (×17): 8.6 mg via ORAL
  Filled 2017-03-20 (×17): qty 1

## 2017-03-20 MED ORDER — SODIUM CHLORIDE 0.9 % IV BOLUS (SEPSIS)
500.0000 mL | Freq: Once | INTRAVENOUS | Status: AC
  Administered 2017-03-20: 500 mL via INTRAVENOUS

## 2017-03-20 MED ORDER — SODIUM CHLORIDE 0.9 % IV SOLN
Freq: Once | INTRAVENOUS | Status: AC
  Administered 2017-03-20: 15:00:00 via INTRAVENOUS

## 2017-03-20 MED ORDER — PREDNISOLONE ACETATE 1 % OP SUSP
1.0000 [drp] | Freq: Two times a day (BID) | OPHTHALMIC | Status: DC
  Administered 2017-03-20 – 2017-03-30 (×23): 1 [drp] via OPHTHALMIC
  Filled 2017-03-20 (×2): qty 1

## 2017-03-20 MED ORDER — MIRABEGRON ER 25 MG PO TB24
25.0000 mg | ORAL_TABLET | Freq: Every day | ORAL | Status: DC
  Administered 2017-03-20 – 2017-03-31 (×12): 25 mg via ORAL
  Filled 2017-03-20 (×14): qty 1

## 2017-03-20 MED ORDER — ACETAMINOPHEN 325 MG PO TABS
650.0000 mg | ORAL_TABLET | Freq: Four times a day (QID) | ORAL | Status: DC | PRN
  Administered 2017-03-21: 650 mg via ORAL
  Filled 2017-03-20 (×2): qty 2

## 2017-03-20 MED ORDER — ACYCLOVIR 800 MG PO TABS
800.0000 mg | ORAL_TABLET | Freq: Two times a day (BID) | ORAL | Status: DC
  Administered 2017-03-20 – 2017-03-31 (×24): 800 mg via ORAL
  Filled 2017-03-20 (×26): qty 1

## 2017-03-20 MED ORDER — EMPTY CONTAINERS FLEXIBLE MISC
3821.0000 [IU] | Status: AC
  Administered 2017-03-20: 3821 [IU] via INTRAVENOUS
  Filled 2017-03-20: qty 153

## 2017-03-20 MED ORDER — ONDANSETRON HCL 4 MG/2ML IJ SOLN
4.0000 mg | Freq: Four times a day (QID) | INTRAMUSCULAR | Status: DC | PRN
  Administered 2017-03-20 – 2017-03-22 (×3): 4 mg via INTRAVENOUS
  Filled 2017-03-20 (×4): qty 2

## 2017-03-20 MED ORDER — DILTIAZEM HCL 100 MG IV SOLR
5.0000 mg/h | INTRAVENOUS | Status: DC
  Administered 2017-03-20: 5 mg/h via INTRAVENOUS
  Administered 2017-03-20: 7.5 mg/h via INTRAVENOUS
  Administered 2017-03-21: 10 mg/h via INTRAVENOUS
  Filled 2017-03-20 (×3): qty 100

## 2017-03-20 MED ORDER — DIGOXIN 0.25 MG/ML IJ SOLN
0.2500 mg | Freq: Four times a day (QID) | INTRAMUSCULAR | Status: AC
Start: 2017-03-20 — End: 2017-03-20
  Administered 2017-03-20 (×2): 0.25 mg via INTRAVENOUS
  Filled 2017-03-20 (×2): qty 2

## 2017-03-20 MED ORDER — ACETAMINOPHEN 650 MG RE SUPP: 650.0000 mg | Freq: Four times a day (QID) | RECTAL | Status: DC | PRN

## 2017-03-20 MED ORDER — SODIUM CHLORIDE 0.9 % IV BOLUS (SEPSIS)
250.0000 mL | Freq: Once | INTRAVENOUS | Status: AC
  Administered 2017-03-20: 250 mL via INTRAVENOUS

## 2017-03-20 NOTE — Progress Notes (Signed)
Patient has been in afib since admission to unit; however, HR increasing with rate between 120-150. EKG obtained. Paged MD and given order to start cardizem gtt.   Noe GensStefanie A Deiondre Harrower, RN

## 2017-03-20 NOTE — Progress Notes (Signed)
Called by RN to assess patient for AF RVR and low BPs.  Upon arrival, patient was alert, conversant, not in acute distress, SBP was in the 70-80s, patient was in traction in the LLE, + pulse in that leg, warm to touch.   Patient was clammy, shocky, and I initiated 250cc NS bolus, I also restarted the Cardizem drip at /hr to help with the AFIB.  Over several minutes, BP improved in the SBP 90-110s, HR was in the 100-120s, Cardizem drip was at 7.5mg /hr.   RN and I paged TRH NP, updated on status of patient, will start patient on NS at 100cc/hr.   Other VS okay, heart/lung sounds normal.    Will follow as needed.   START TIME 455 END TIME 545

## 2017-03-20 NOTE — Progress Notes (Signed)
OT Cancellation Note  Patient Details Name: [name] MRN: [id number] DOB: 09/20/1929   Cancelled Treatment:    Reason Eval/Treat Not Completed: Patient not medically ready (active bedrest orders with plan for possible sx 9/10). Will follow up for OT eval as time allows and pt is medically appropriate.  Gaye AlkenBailey A Attila Mccarthy M.S., OTR/L Pager: [phone]  03/20/2017, 11:55 AM

## 2017-03-20 NOTE — Progress Notes (Signed)
PT Cancellation Note  Patient Details Name: Tracey PingConstance Porter MRN: [id number] DOB: 06/18/1930   Cancelled Treatment:    Reason Eval/Treat Not Completed: Other (comment)   Fall with multiple pelvic fractures;  Await Orthopedic Consult, and decisions on definitive plan;   Will follow;  Van ClinesHolly Eleasha Cataldo, PT  Acute Rehabilitation Services Pager [phone] Office [phone]    Levi AlandHolly H Natalin Bible 03/20/2017, 7:36 AM

## 2017-03-20 NOTE — Progress Notes (Addendum)
1405 Dr. Caleb PoppNettey at bedside and updated patient and family. Also made aware patient continues to complain of right leg cramping and on last assessment c/o RLQ abdominal tenderness with palpation. Orders received and carried out. Continuing to monitor.

## 2017-03-20 NOTE — Plan of Care (Signed)
Problem: Education: Goal: Knowledge of [facility] Education information/materials will improve Outcome: Progressing Patient oriented to unit and hospital. Discussed plan of care. Educated at length on traction equipment and how it works. Patient denies pain during most of shift, but increased pain with movement. Spoke to daughter on the phone and updated her on plan of care/ treatment. She is aware that patient's pelvis is fractured and that they will plan for surgery.   Problem: Pain Managment: Goal: General experience of comfort will improve Outcome: Progressing Patient requesting pain medication and was given Norco to avoid dropping her blood pressure.   Problem: Skin Integrity: Goal: Risk for impaired skin integrity will decrease Outcome: Progressing Patient with foam placed to bottom and reassessment to left leg in traction.   Problem: Activity: Goal: Risk for activity intolerance will decrease Outcome: Progressing Patient on bedrest right now until surgery.

## 2017-03-20 NOTE — Consult Note (Signed)
Cardiology Consult Note  Admit date: 03/19/2017 Name: Tracey Porter 83 y.o.  female DOB:  09-15-1929 MRN:  [id number]  Today's date:  03/20/2017  Referring Physician:    [facility]  Primary Physician:    [facility] family physicians  Reason for Consultation:    Atrial fibrillation and hypotension  IMPRESSIONS: 1.  Chronic atrial fibrillation with rapid response due to trauma 2.  Hypotension very concerned this represents ongoing pelvic bleeding from fracture 3.  Hypertensive heart disease 4.  Mild dementia by history 5.  Aortic atherosclerosis  RECOMMENDATION: I'm concerned that the ongoing hypotension may be related to significant blood loss in her pelvis.  Her hemoglobin has dropped and she previously was on Eliquis for atrial fibrillation and also significant hypertensive medications.  Suggest repeat blood work and CT scan andaddressing ongoing bleeding.    1.  Correct potential pelvic hematoma/bleeding  urgently either with orthopedics or interventional radiology -I personally spoke with Dr. Caleb Popp concerning this 2.  Will use intravenous digoxin to help with rate control 3.  May use diltiazem to help with rate control and hold her other blood pressure medicines.  If blood pressure is a problem we can hold the diltiazem if needed but suspect this is more a volume issue related to active bleeding.  HISTORY:  This 83 year old female has a history of hypertension and diabetes and SVT.  She has been followed by Dr. Tresa Endo.  She originally had what was called SVT and treated with diltiazem but was later seen by Dr. Tresa Endo and then was found to be in atrial fibrillation and seen by the PA.  She had a myocardial perfusion scan done that showed an EF of around 60%.  She had an admission at [facility] for a hypertensive crisis and EF was reported to be low there however EF in 2017 was 60-65%.  She recently had been seen and her diltiazem had been increased because of increased  ventricular response.  He had a negative myocardial perfusion scan with an EF of around 60% on August 4.  She had a mechanical fall and was admitted last night with a pelvic fracture with a question of active bleeding noted on CT scan.  She has become hypotensive overnight and has required fluid boluses.  Her hemoglobin has dropped from 12.3-8.9 as of 10 AM this morning.  She complains of mild left lower quadrant abdominal pain but no chest pain.  Blood pressure is currently around 80 systolic.  Past Medical History:  Diagnosis Date  . Diabetes mellitus without complication (HCC)   . Hypertension       Past Surgical History:  Procedure Laterality Date  . ABDOMINAL HYSTERECTOMY  1976  . EYE SURGERY       Allergies:  is allergic to penicillins.   Medications: Prior to Admission medications   Medication Sig Start Date End Date Taking? Authorizing Provider  acyclovir (ZOVIRAX) 400 MG tablet Take 800 mg by mouth 2 (two) times daily. 03/04/17  Yes [provider]  alendronate (FOSAMAX) 70 MG tablet Take 70 mg by mouth once a week. 03/04/17  Yes [provider]  apixaban (ELIQUIS) 5 MG TABS tablet Take 1 tablet (5 mg total) by mouth 2 (two) times daily. Please cancel previous rx's sent; call pt with price before filling 02/24/17  Yes Azalee Course, PA  atorvastatin (LIPITOR) 40 MG tablet Take 1 tablet (40 mg total) by mouth daily. 01/10/17  Yes Lennette Bihari, MD  diltiazem (CARDIZEM CD) 360 MG  24 hr capsule Take 1 capsule (360 mg total) by mouth daily. 02/25/17 05/26/17 Yes Lennette Bihari, MD  furosemide (LASIX) 20 MG tablet Take 1 tablet (20 mg total) by mouth daily. 02/08/17  Yes Lennette Bihari, MD  labetalol (NORMODYNE) 300 MG tablet Take 1 tablet (300 mg total) by mouth 2 (two) times daily. Please cancel previous rx's sent 02/24/17  Yes Azalee Course, PA  metFORMIN (GLUCOPHAGE-XR) 500 MG 24 hr tablet Take 500 mg by mouth every morning. 03/04/17  Yes [provider]  MYRBETRIQ  25 MG TB24 tablet Take 25 mg by mouth daily. 03/04/17  Yes [provider]  nitroGLYCERIN (NITROSTAT) 0.4 MG SL tablet Place 1 tablet (0.4 mg total) under the tongue every 5 (five) minutes as needed for chest pain. 02/01/17 05/02/17 Yes Azalee Course, PA  potassium chloride (K-DUR) 10 MEQ tablet Take 1 tablet (10 mEq total) by mouth daily. 03/10/17  Yes Azalee Course, PA  prednisoLONE acetate (PRED FORTE) 1 % ophthalmic suspension  03/19/17  Yes [provider]    Family History: Family Status  Relation Status  . Mother Deceased  . Father Deceased  . Sister Alive  . MGM Alive  . MGF Alive  . PGM Alive  . PGF Alive  . Daughter Alive  . Son Deceased  . Son Alive  . Daughter Alive    Social History:   reports that she has quit smoking. She has never used smokeless tobacco. She reports that she drinks alcohol. She reports that she does not use drugs.  She currently lives alone and is a retired Engineer, civil (consulting).  Review of Systems: Complains of mild abdominal pain.  Other than as noted above remainder of the review of systems is unremarkable.  Physical Exam: BP (!) 85/47   Pulse 97   Temp 97.7 F (36.5 C) (Oral)   Resp (!) 26   Ht  (1.676 m)   Wt 77.1 kg (170 lb) Comment: per NT  SpO2 97%   BMI 27.44 kg/m   General appearance: Elderly female lying in bed currently in no acute distress Head: Normocephalic, without obvious abnormality, atraumatic Eyes: conjunctivae/corneas clear. PERRL, EOM's intact. Fundi not examined Neck: no adenopathy, no carotid bruit, no JVD and supple, symmetrical, trachea midline Lungs: clear to auscultation bilaterally Heart: Irregular rhythm normal S1-S2, no S3 Abdomen: Fairly significant tenderness in the left lower quadrant, bowel sounds present Pelvic: deferred Extremities: Left lower extremity is currently in a soft splint.  There is no edema noted.  Pulses are present bilaterally Pulses: 2+ and symmetric Skin: Skin color, texture, turgor  normal. No rashes or lesions Neurologic: Grossly normal Psych: Alert and oriented x 3 Labs: CBC  Recent Labs  03/19/17 2010  03/20/17 0950  WBC 13.4*  < > 15.1*  RBC 4.01  < > 2.89*  HGB 12.3  < > 8.9*  HCT 37.2  < > 27.0*  PLT 293  < > 256  MCV 92.8  < > 93.4  MCH 30.7  < > 30.8  MCHC 33.1  < > 33.0  RDW 14.2  < > 14.2  LYMPHSABS 1.6  --   --   MONOABS 0.8  --   --   EOSABS 0.1  --   --   BASOSABS 0.0  --   --   < > = values in this interval not displayed. CMP   Recent Labs  03/20/17 0404  NA 136  K 4.1  CL 101  CO2 24  GLUCOSE 239*  BUN 21*  CREATININE 0.98  CALCIUM 8.9  PROT 6.6  ALBUMIN 3.5  AST 24  ALT 22  ALKPHOS 61  BILITOT 1.0  GFRNONAA 51*  GFRAA 59*    Radiology:  Aortic atherosclerosis, atelectasis as the bases  EKG:  Atrial fibrillation with rapid response Independently reviewed by me  Signed:  W. Ashley RoyaltySpencer Nera Haworth, Jr. MD [facility]elingFACC   Cardiology Consultant  03/20/2017, 12:38 PM

## 2017-03-20 NOTE — Consult Note (Signed)
PULMONARY / CRITICAL CARE MEDICINE   Name: Tracey Porter MRN: [id number] DOB: 1930-05-04    ADMISSION DATE:  03/19/2017 CONSULTATION DATE:  03/20/2017   REFERRING MD:  Caleb Popp CHIEF COMPLAINT:  Hypotension   HISTORY OF PRESENT ILLNESS:   83 y.o. femalewith medical history significant of HTN, DM2, A.fib on Eliquis, HLD, Mild dementia, Macular degeneration. She presented after a fall, tripping over a step and developed multiple pelvic fractures. Patient is in afib with RVR and is developing a worsening extraperitoneal  Hematoma received kcentra and is receiving 2 u PRBCs.   I came to assess the patient due to hypotensive episodes earlier the day which has improved with blood transfusion. Patient is asymptomatic, no shortness of breath, no chest pain, no dizziness. Patient BP at the time of my assessment 117/57 HR112 irregular. No signs of active bleeding.      PAST MEDICAL HISTORY :  She  has a past medical history of Diabetes mellitus without complication (HCC) and Hypertension.  PAST SURGICAL HISTORY: She  has a past surgical history that includes Eye surgery and Abdominal hysterectomy (1976).  Allergies  Allergen Reactions  . Penicillins Rash    No current facility-administered medications on file prior to encounter.    Current Outpatient Prescriptions on File Prior to Encounter  Medication Sig  . apixaban (ELIQUIS) 5 MG TABS tablet Take 1 tablet (5 mg total) by mouth 2 (two) times daily. Please cancel previous rx's sent; call pt with price before filling  . atorvastatin (LIPITOR) 40 MG tablet Take 1 tablet (40 mg total) by mouth daily.  [name] diltiazem (CARDIZEM CD) 360 MG 24 hr capsule Take 1 capsule (360 mg total) by mouth daily.  . furosemide (LASIX) 20 MG tablet Take 1 tablet (20 mg total) by mouth daily.  [name] labetalol (NORMODYNE) 300 MG tablet Take 1 tablet (300 mg total) by mouth 2 (two) times daily. Please cancel previous rx's sent  . nitroGLYCERIN (NITROSTAT) 0.4 MG SL  tablet Place 1 tablet (0.4 mg total) under the tongue every 5 (five) minutes as needed for chest pain.  . potassium chloride (K-DUR) 10 MEQ tablet Take 1 tablet (10 mEq total) by mouth daily.    FAMILY HISTORY:  Her indicated that her mother is deceased. She indicated that her father is deceased. She indicated that her sister is alive. She indicated that her maternal grandmother is alive. She indicated that her maternal grandfather is alive. She indicated that her paternal grandmother is alive. She indicated that her paternal grandfather is alive. She indicated that both of her daughters are alive. She indicated that only one of her two sons is alive.    SOCIAL HISTORY: She  reports that she has quit smoking. She has never used smokeless tobacco. She reports that she drinks alcohol. She reports that she does not use drugs.  REVIEW OF SYSTEMS:   All 11 points system review was unremarkable other than what is mentioned in HPI    VITAL SIGNS: BP (!) 95/57 (BP Location: Left Arm)   Pulse 67   Temp 98.2 F (36.8 C) (Oral)   Resp (!) 24   Ht  (1.676 m)   Wt 77.1 kg (170 lb) Comment: per NT  SpO2 96%   BMI 27.44 kg/m   HEMODYNAMICS:  on diltiazem drip      INTAKE / OUTPUT: I/O last 3 completed shifts: In: 2651.1 [I.V.:201.4; Other:1950; IV Piggyback:499.8] Out: 250 [Urine:250]  PHYSICAL EXAMINATION: General: alert oriented not in any distress Neuro:  Awake alert moving all extremities, cranial nerves intact HEENT:  Moist  Cardiovascular:  ireegularly irregular heart sounds  Lungs:  Clear equal air sounds bilaterally no wheezing  Abdomen: tenderness at the lower quadrants  Musculoskeletal:  No edema Skin:  No rash   LABS:  BMET  Recent Labs Lab 03/19/17 2010 03/20/17 0404  NA 137 136  K 3.5 4.1  CL 101 101  CO2 24 24  BUN 21* 21*  CREATININE 0.86 0.98  GLUCOSE 135* 239*    Electrolytes  Recent Labs Lab 03/19/17 2010 03/20/17 0404  CALCIUM 9.4 8.9   MG  --  1.5*  PHOS  --  4.5    CBC  Recent Labs Lab 03/20/17 0404 03/20/17 0950 03/20/17 1306  WBC 17.3* 15.1* 16.6*  HGB 10.6* 8.9* 8.3*  HCT 31.7* 27.0* 25.6*  PLT 299 256 264    Coag's  Recent Labs Lab 03/19/17 2010  INR 1.22    Sepsis Markers No results for input(s): LATICACIDVEN, PROCALCITON, O2SATVEN in the last 168 hours.  ABG No results for input(s): PHART, PCO2ART, PO2ART in the last 168 hours.  Liver Enzymes  Recent Labs Lab 03/19/17 2010 03/20/17 0404  AST 23 24  ALT 24 22  ALKPHOS 74 61  BILITOT 0.8 1.0  ALBUMIN 4.0 3.5    Cardiac Enzymes No results for input(s): TROPONINI, PROBNP in the last 168 hours.  Glucose  Recent Labs Lab 03/20/17 0335 03/20/17 1050 03/20/17 1310 03/20/17 1550 03/20/17 1746  GLUCAP 206* 170* 161* 164* 156*    Imaging Dg Chest 1 View  Result Date: 03/19/2017 CLINICAL DATA:  Pre-surgical imaging. History of diabetes and hypertension. EXAM: CHEST 1 VIEW COMPARISON:  04/20/2016 FINDINGS: Shallow inspiration with some elevation of the right hemidiaphragm. Probable atelectasis in the lung bases. No airspace disease or consolidation is suggested. Peribronchial thickening consistent with chronic bronchitis. No blunting of costophrenic angles. No pneumothorax. Calcified and tortuous aorta. Degenerative changes in the spine. IMPRESSION: Shallow inspiration with probable atelectasis in the lung bases. No evidence of active pulmonary disease. Aortic atherosclerosis. Electronically Signed   By: Burman Nieves M.D.   On: 03/19/2017 21:28   Ct Pelvis Wo Contrast  Result Date: 03/20/2017 CLINICAL DATA:  Follow up pelvic fracture. Initial encounter. EXAM: CT PELVIS WITHOUT CONTRAST TECHNIQUE: Multidetector CT imaging of the pelvis was performed following the standard protocol without intravenous contrast. COMPARISON:  Yesterday FINDINGS: Urinary Tract: The bladder is displaced to the left and partially effaced. Bowel:  Postoperative rectosigmoid junction for uncertain indication. No visible bowel injury. Vascular/Lymphatic: Bilateral pelvic hematoma, significantly increased on the right compared to prior, where there is tracking into the lower abdominal extraperitoneal space. Right pelvic sidewall hematoma measures up to 12 cm by 8 cm on axial slices. The left pelvic high-density areas noted on previous CT are likely lymph nodes given their stable density and shape. There is atherosclerotic calcification. 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 Reproductive:  Hysterectomy.  Pelvic floor laxity. Other:  No pneumoperitoneum or definite peritoneal fluid. Musculoskeletal: Left obturator ring fractures through the mid inferior pubic ramus at the puboacetabular junction. There is left sacral ala fracture extending to the sacroiliac joint inferiorly; no displacement along the sacral foramina. Segmental fracturing of the right inferior pubic ramus and single fracture in the right superior pubic ramus. No pelvic diastasis. No proximal hip fracture or dislocation. L5 left transverse process fracture. Lower lumbar disc and facet degeneration with grade 1  L4-5 anterolisthesis. Critical Value/emergent results were called by telephone at the time of interpretation on 03/20/2017 at 4:51 pm to Dr. Jacquelin Hawking , who verbally acknowledged these results. IMPRESSION: 1. Large extraperitoneal pelvic hematoma, especially on the right, significantly increased from previous scan. Right-sided hematoma measures up to 12 x 8 cm. 2. Bilateral obturator ring, left L5 transverse process, and left sacral ala fractures as described. 3. Atherosclerosis. Intimal plaque distortion at the right common and external iliac suggests remote dissection. Electronically Signed   By: Marnee Spring M.D.   On: 03/20/2017 16:56   Ct Pelvis Wo Contrast  Result Date: 03/19/2017 CLINICAL DATA:  Fall with left hip pain.  Pelvic fracture known or suspected. EXAM: CT PELVIS WITHOUT CONTRAST TECHNIQUE: Multidetector CT imaging of the pelvis was performed following the standard protocol without intravenous contrast. COMPARISON:  Left hip radiographs. FINDINGS: Urinary Tract: Urinary bladder is physiologically distended, no bladder wall thickening. Bowel: Enteric sutures in the sigmoid colon. Moderate colonic stool burden in the included colon. No evidence of bowel inflammation. Vascular/Lymphatic: Vascular calcifications without aneurysm. Reproductive:  Post hysterectomy.  No adnexal mass. Other: Pelvic hemorrhage related to pelvic fractures, greater on the left. On the left this causes mild associated mass-effect on the urinary bladder. Some hyperdense material in the region of the left pelvic hemorrhage can be seen with active bleeding. Musculoskeletal: Left pelvis: Displaced left sacral fracture, zone 1 extending into the sacroiliac joint. Fracture is lateral to the sacral foramen. Fracture extends superiorly to involve the left L5 transverse process. Superior pubic ramus fracture at the puboacetabular junction, comminuted and mildly displaced. Displaced comminuted left inferior pubic ramus fracture. Right pelvis: Comminuted superior and inferior pubic rami fractures. Fractures are minimally displaced. No definite right sacral fracture. Pubic symphysis remains congruent.  Proximal femora are intact. IMPRESSION: 1. Displaced comminuted fractures of the left superior inferior pubic ramus, superior ramus fracture at the puboacetabular junction. Displaced left sacral fracture extending into the sacroiliac joint. Left L5 transverse process fracture. 2. Displaced right superior and inferior pubic rami fractures. No definite right sacral fracture. 3. Pelvic hemorrhage related to pelvic fractures, greater on the left. Questionable active bleeding within the left pelvic hemorrhage. These results were called by telephone at the time of  interpretation on 03/19/2017 at 9:57 pm to PA [facility] , who verbally acknowledged these results. Electronically Signed   By: Rubye Oaks M.D.   On: 03/19/2017 21:58   Dg Hip Unilat With Pelvis 2-3 Views Left  Result Date: 03/19/2017 CLINICAL DATA:  Larey Seat down the, left hip pain EXAM: DG HIP (WITH OR WITHOUT PELVIS) 2-3V LEFT COMPARISON:  None. FINDINGS: Pubic symphysis is intact. There are displaced right superior and inferior pubic rami fractures. Possible vertically-oriented fracture through the right aspect of the sacrum. Superiorly displaced left inferior and superior pubic rami fractures with additional fracture suspected through the left sacrum. There is probably slight widening of the left SI joint. Both femoral heads project in joint. Vascular calcifications. Postsurgical suture in the pelvis IMPRESSION: 1. Superiorly displaced left superior and inferior pubic rami with probable vertically-oriented fracture through the left sacrum and suspected mild widening of left SI joint, suspect for vertical shear injury. 2. Displaced fractures involving the right superior and inferior pubic rami with questionable fracture lucency in the right aspect of sacrum. Critical Value/emergent results were called by telephone at the time of interpretation on 03/19/2017 at 9:00 pm to Dr. Marily Memos , who verbally acknowledged these results. Electronically Signed   By:  Kim  Fujinaga M.D.   On: 03/19/2017 21:00     STUDIES:  CT abdomen 9/9Jasmine Pang  1. Large extraperitoneal pelvic hematoma, especially on the right, significantly increased from previous scan. Right-sided hematoma measures up to 12 x 8 cm. 2. Bilateral obturator ring, left L5 transverse process, and left sacral ala fractures as described. 3. Atherosclerosis. Intimal plaque distortion at the right common and external iliac suggests remote dissection.   ASSESSMENT / PLAN:  Pelvic fracture Hypotension secondary to blood loss\ Acute anemia of blood  loss Atrial fibrillation with rapid ventricular response Extraperitoneal hemorrhage on oral anticoagulation  Plan: - continue with IVF - continue holding xeralto (received [place] - orthopedics recommendations are apreciated. - continue diltiazem drip. Consider amiodarone if hypotensive. - receiving 2 u PRBC - repeat CBC q 6hrs - foley cath insertions and follow urine output - Patient can be observed in the step down unit for now.   FAMILY  - Updates: daughter bedside updated   - Inter-disciplinary family meet or Palliative Care meeting due by:  9/18    [facility]ulmonary and Critical Care Medicine [facility] Pager: [phone]  03/20/2017, 8:12 PM

## 2017-03-20 NOTE — Progress Notes (Signed)
Cardizem gtt started at 0440; BP noted to be dropping. Patient clammy, with Right JVD, and c/o nausea. Paged MD and Rapid Response to check on patient. Per conversation with Rapid Response nurse, bolus of 250 given along with cardizem gtt. Patient cardizem gtt at 7.5 and another 250 bolus given. Per discussion with MD- patient started on maintenance fluids. Continuing to monitor patient closely.  Noe GensStefanie A Tanush Drees, RN

## 2017-03-20 NOTE — Plan of Care (Signed)
Problem: Health Behavior/Discharge Planning: Goal: Ability to manage health-related needs will improve Outcome: Progressing Per MD note, patient will likely require rehab at discharge.   Problem: Physical Regulation: Goal: Ability to maintain clinical measurements within normal limits will improve Outcome: Progressing Patient with afib RVR requiring cardizem gtt and close BP monitoring. Patient became diaphoretic , nauseous, and hypotensive during shift. Rapid response called and patient stabilized.

## 2017-03-20 NOTE — Progress Notes (Signed)
Dr. Caleb PoppNettey text paged re: patient BP 80/49 with MAP 59, HR 1teens-120s on 10mg /hr cardizem gtt and NS @ 100cc/hr. Continuing with current orders. Continuing to monitor closely.

## 2017-03-20 NOTE — Progress Notes (Signed)
Dr. Caleb PoppNettey made aware patient bladder scan showed less than 125 cc. Continuing to monitor.

## 2017-03-20 NOTE — Progress Notes (Signed)
Dr. Caleb PoppNettey text paged and made aware of manual BP. Patient currently receiving NS bolus. Also made aware patient c/o right thigh leg cramps. Cardiologist at bedside for consult for afib. Nursing to continue to monitor.

## 2017-03-20 NOTE — Progress Notes (Signed)
Dr. Caleb PoppNettey made aware patient Mag 1.5 and Hgb 8.9 from 10.6 earlier. Orders received and carried out. Continuing to monitor.

## 2017-03-20 NOTE — Consult Note (Signed)
ORTHOPAEDIC CONSULTATION  REQUESTING PHYSICIAN: Mariel Aloe, MD  Chief Complaint: Hip pain after mechanical fall  Assessment / Plan: Active Problems:   DM2 (diabetes mellitus, type 2) (HCC)   Paroxysmal SVT (supraventricular tachycardia) (HCC)   Essential hypertension   Closed pelvic fracture (HCC)   Leukocytosis   Fall at home, initial encounter   Multiple fractures of pelvis with unstable disruption of pelvic ring, initial encounter for closed fracture [facility])   Atrial fibrillation with RVR ([facility])  Lateral compression injury: B superior / inferior rami fracture, Left sacral fracture  Pelvis appears to be unstable on imaging.  Hematoma on CT.  Hgb 12.3 >> 8.9 at 0950.  Plts down some, but WNL. Patient comfortable, NAD, Pain controlled now. Patient previously active and ambulatory without assistive devices.  No Hx or MI/CVA.   Bedrest, bucks traction  NPO  Continue to Follow Hemoglobin and resuscitate as appropriate    Dr. Alain Marion has conferred w/ Dr. Doreatha Martin who will review imaging and discuss surgical options with patient and family.  Surgery not likely sooner than tomorrow (Monday 03/21/17) unless condition changes.  AFIB and other medical co morbidities (HTN, DM2, HLD) per primary team.  Eliquis held.  On Cardizem drip.   HPI: Tracey Porter is a 83 y.o. female who complains of pelvic pain and inability to bear weight after a fall down 3 stairs onto her left side.  No LOC.  She presented to ED via EMS where imaging shows multiple pelvic fractures.  Orthopedics was consulted for evaluation.    This morning the patient says her pain is a 2/10. She denies lower extremity numbness/decreased sensation. She denies history of MI, CVA. She denies known history of DVT but seems a little bit uncertain of same.  She was previously ambulatory and active without assistive devices. Participates in water aerobics 3 times a week.  She has allergy to penicillin - remote reaction  with hives and some possible impact on breathing.  Last meal 03/19/17.  Past Medical History:  Diagnosis Date  . Diabetes mellitus without complication ([place])   . Hypertension    Past Surgical History:  Procedure Laterality Date  . ABDOMINAL HYSTERECTOMY  1976  . EYE SURGERY     Social History   Social History  . Marital status: Widowed    Spouse name: N/A  . Number of children: N/A  . Years of education: N/A   Social History Main Topics  . Smoking status: Former Research scientist (life sciences)  . Smokeless tobacco: Never Used  . Alcohol use Yes  . Drug use: No  . Sexual activity: Not Asked   Other Topics Concern  . None   Social History Narrative  . None   Family History  Problem Relation Age of Onset  . Lung cancer Father   . Lymphoma Daughter    Allergies  Allergen Reactions  . Penicillins Rash   Prior to Admission medications   Medication Sig Start Date End Date Taking? Authorizing Provider  acyclovir (ZOVIRAX) 400 MG tablet Take 800 mg by mouth 2 (two) times daily. 03/04/17  Yes [provider]  alendronate (FOSAMAX) 70 MG tablet Take 70 mg by mouth once a week. 03/04/17  Yes [provider]  apixaban (ELIQUIS) 5 MG TABS tablet Take 1 tablet (5 mg total) by mouth 2 (two) times daily. Please cancel previous rx's sent; call pt with price before filling 02/24/17  Yes Almyra Deforest, PA  atorvastatin (LIPITOR) 40 MG tablet Take 1 tablet (  40 mg total) by mouth daily. 01/10/17  Yes Troy Sine, MD  diltiazem (CARDIZEM CD) 360 MG 24 hr capsule Take 1 capsule (360 mg total) by mouth daily. 02/25/17 05/26/17 Yes Troy Sine, MD  furosemide (LASIX) 20 MG tablet Take 1 tablet (20 mg total) by mouth daily. 02/08/17  Yes Troy Sine, MD  labetalol (NORMODYNE) 300 MG tablet Take 1 tablet (300 mg total) by mouth 2 (two) times daily. Please cancel previous rx's sent 02/24/17  Yes Almyra Deforest, PA  metFORMIN (GLUCOPHAGE-XR) 500 MG 24 hr tablet Take 500 mg by mouth every morning. 03/04/17  Yes  [provider]  MYRBETRIQ 25 MG TB24 tablet Take 25 mg by mouth daily. 03/04/17  Yes [provider]  nitroGLYCERIN (NITROSTAT) 0.4 MG SL tablet Place 1 tablet (0.4 mg total) under the tongue every 5 (five) minutes as needed for chest pain. 02/01/17 05/02/17 Yes Almyra Deforest, PA  potassium chloride (K-DUR) 10 MEQ tablet Take 1 tablet (10 mEq total) by mouth daily. 03/10/17  Yes Meng, Isaac Laud, PA  prednisoLONE acetate (PRED FORTE) 1 % ophthalmic suspension  03/19/17  Yes [provider]   Dg Chest 1 View  Result Date: 03/19/2017 CLINICAL DATA:  Pre-surgical imaging. History of diabetes and hypertension. EXAM: CHEST 1 VIEW COMPARISON:  04/20/2016 FINDINGS: Shallow inspiration with some elevation of the right hemidiaphragm. Probable atelectasis in the lung bases. No airspace disease or consolidation is suggested. Peribronchial thickening consistent with chronic bronchitis. No blunting of costophrenic angles. No pneumothorax. Calcified and tortuous aorta. Degenerative changes in the spine. IMPRESSION: Shallow inspiration with probable atelectasis in the lung bases. No evidence of active pulmonary disease. Aortic atherosclerosis. Electronically Signed   By: Lucienne Capers M.D.   On: 03/19/2017 21:28   Ct Pelvis Wo Contrast  Result Date: 03/19/2017 CLINICAL DATA:  Fall with left hip pain. Pelvic fracture known or suspected. EXAM: CT PELVIS WITHOUT CONTRAST TECHNIQUE: Multidetector CT imaging of the pelvis was performed following the standard protocol without intravenous contrast. COMPARISON:  Left hip radiographs. FINDINGS: Urinary Tract: Urinary bladder is physiologically distended, no bladder wall thickening. Bowel: Enteric sutures in the sigmoid colon. Moderate colonic stool burden in the included colon. No evidence of bowel inflammation. Vascular/Lymphatic: Vascular calcifications without aneurysm. Reproductive:  Post hysterectomy.  No adnexal mass. Other: Pelvic hemorrhage related to  pelvic fractures, greater on the left. On the left this causes mild associated mass-effect on the urinary bladder. Some hyperdense material in the region of the left pelvic hemorrhage can be seen with active bleeding. Musculoskeletal: Left pelvis: Displaced left sacral fracture, zone 1 extending into the sacroiliac joint. Fracture is lateral to the sacral foramen. Fracture extends superiorly to involve the left L5 transverse process. Superior pubic ramus fracture at the puboacetabular junction, comminuted and mildly displaced. Displaced comminuted left inferior pubic ramus fracture. Right pelvis: Comminuted superior and inferior pubic rami fractures. Fractures are minimally displaced. No definite right sacral fracture. Pubic symphysis remains congruent.  Proximal femora are intact. IMPRESSION: 1. Displaced comminuted fractures of the left superior inferior pubic ramus, superior ramus fracture at the puboacetabular junction. Displaced left sacral fracture extending into the sacroiliac joint. Left L5 transverse process fracture. 2. Displaced right superior and inferior pubic rami fractures. No definite right sacral fracture. 3. Pelvic hemorrhage related to pelvic fractures, greater on the left. Questionable active bleeding within the left pelvic hemorrhage. These results were called by telephone at the time of interpretation on 03/19/2017 at 9:57 pm to [place] ,  who verbally acknowledged these results. Electronically Signed   By: Jeb Levering M.D.   On: 03/19/2017 21:58   Dg Hip Unilat With Pelvis 2-3 Views Left  Result Date: 03/19/2017 CLINICAL DATA:  [place] down the, left hip pain EXAM: DG HIP (WITH OR WITHOUT PELVIS) 2-3V LEFT COMPARISON:  None. FINDINGS: Pubic symphysis is intact. There are displaced right superior and inferior pubic rami fractures. Possible vertically-oriented fracture through the right aspect of the sacrum. Superiorly displaced left inferior and superior pubic rami fractures with  additional fracture suspected through the left sacrum. There is probably slight widening of the left SI joint. Both femoral heads project in joint. Vascular calcifications. Postsurgical suture in the pelvis IMPRESSION: 1. Superiorly displaced left superior and inferior pubic rami with probable vertically-oriented fracture through the left sacrum and suspected mild widening of left SI joint, suspect for vertical shear injury. 2. Displaced fractures involving the right superior and inferior pubic rami with questionable fracture lucency in the right aspect of sacrum. Critical Value/emergent results were called by telephone at the time of interpretation on 03/19/2017 at 9:00 pm to Dr. Merrily Pew , who verbally acknowledged these results. Electronically Signed   By: Donavan Foil M.D.   On: 03/19/2017 21:00    Positive ROS: All other systems have been reviewed and were otherwise negative with the exception of those mentioned in the HPI and as above.  Objective: Labs cbc  Recent Labs  03/19/17 2010 03/20/17 0404  WBC 13.4* 17.3*  HGB 12.3 10.6*  HCT 37.2 31.7*  PLT 293 299    Labs inflam No results for input(s): CRP in the last 72 hours.  Invalid input(s): ESR  Labs coag  Recent Labs  03/19/17 2010  INR 1.22     Recent Labs  03/19/17 2010 03/20/17 0404  NA 137 136  K 3.5 4.1  CL 101 101  CO2 24 24  GLUCOSE 135* 239*  BUN 21* 21*  CREATININE 0.86 0.98  CALCIUM 9.4 8.9    Physical Exam: Vitals:   03/20/17 0700 03/20/17 0800  BP: (!) 91/57 90/65  Pulse: 70 83  Resp: 19 (!) 35  Temp:    SpO2: 98% 96%   General: Alert, no acute distress.  Calm, conversant.  Daughters at bedside Mental status: Alert and Oriented x3 Neurologic: Speech Clear and organized, no gross focal findings or movement disorder appreciated. Respiratory: No cyanosis, no use of accessory musculature Cardiovascular: No pedal edema GI: Abdomen is soft and non-tender, non-distended. Skin: Warm and dry.   No lesions in the area of chief complaint  Extremities: Warm and well perfused w/o edema Psychiatric: Patient is competent for consent with normal mood and affect  MUSCULOSKELETAL:  Pelvis was not manipulated B Feet warm LLE in bucks traction LLE with sensation intact distally, EHL/FHL intact RLE sensation intact distally, EHL/FHL, dorsiflexion/plantarflexion intact Other extremities are atraumatic with painless ROM and NVI.  L Wrist with chronic non-painful deformity.   Prudencio Burly III PA-C 03/20/2017 10:24 AM

## 2017-03-20 NOTE — Progress Notes (Signed)
[facility]; given pager number listed on AMION in order to contact Dr. Caleb PoppNettey.

## 2017-03-20 NOTE — Progress Notes (Signed)
PROGRESS NOTE    Tracey Porter  ZOX:[id number] DOB: 10/12/1929 DOA: 03/19/2017 PCP: [place], Julie M, NP   Brief Narrative: Tracey Porter is a 83 y.o. female with medical history significant of HTN, DM2, A.fib on Eliquis, HLD, Mild dementia, Macular degeneration. She presented after a fall, tripping over a step and developed multiple pelvic fractures.   Assessment & Plan:   Principal Problem:   Multiple fractures of pelvis with unstable disruption of pelvic ring, initial encounter for closed fracture (HCC) Active Problems:   DM2 (diabetes mellitus, type 2) (HCC)   Paroxysmal SVT (supraventricular tachycardia) (HCC)   Essential hypertension   Closed pelvic fracture (HCC)   Leukocytosis   Fall at home, initial encounter   Atrial fibrillation with RVR (HCC)   Closed pelvic fracture Orthopedic surgery recommendations: bucks traction  Pelvic hematoma Secondary to fracture -Serial CBCs  Atrial fibrillation with RVR -diltiazem drip  Essential hypertension Patient with hypotension.  Leukocytosis Trended down slightly.  Diabetes mellitus -SSI   DVT prophylaxis: SCDs Code Status: Full code Family Communication: None at bedside Disposition Plan: Discharge pending orthopedic workup   Consultants:   Orthopedic surgery  Procedures:   None  Antimicrobials:  None    Subjective: Patient reports pelvic pain.   Objective: Vitals:   03/20/17 1115 03/20/17 1125 03/20/17 1130 03/20/17 1150  BP: (!) 80/41 (!) 85/53 (!) 80/49 (!) 81/47  Pulse: (!) 129 (!) 105 (!) 108 (!) 105  Resp: (!) 23 20 (!) 27 (!) 40  Temp:      TempSrc:      SpO2: 98% 98% 96% 98%  Weight:      Height:        Intake/Output Summary (Last 24 hours) at 03/20/17 1215 Last data filed at 03/20/17 0700  Gross per 24 hour  Intake            608.5 ml  Output              200 ml  Net            408.5 ml   Filed Weights   03/20/17 0150  Weight: 77.1 kg (170 lb)     Examination:  General exam: Appears calm and comfortable Respiratory system: Clear to auscultation. Respiratory effort normal. Cardiovascular system: S1 & S2 heard, Irregular rhythm, fast heart rate. No murmurs. Gastrointestinal system: Abdomen is nondistended, soft and nontender. Normal bowel sounds heard. Central nervous system: Alert and oriented. No focal neurological deficits. Extremities: No edema. No calf tenderness Skin: No cyanosis. No rashes Psychiatry: Judgement and insight appear normal. Mood & affect appropriate.     Data Reviewed: I have personally reviewed following labs and imaging studies  CBC:  Recent Labs Lab 03/19/17 2010 03/20/17 0404 03/20/17 0950  WBC 13.4* 17.3* 15.1*  NEUTROABS 10.9*  --   --   HGB 12.3 10.6* 8.9*  HCT 37.2 31.7* 27.0*  MCV 92.8 93.0 93.4  PLT 293 299 256   Basic Metabolic Panel:  Recent Labs Lab 03/19/17 2010 03/20/17 0404  NA 137 136  K 3.5 4.1  CL 101 101  CO2 24 24  GLUCOSE 135* 239*  BUN 21* 21*  CREATININE 0.86 0.98  CALCIUM 9.4 8.9  MG  --  1.5*  PHOS  --  4.5   GFR: Estimated Creatinine Clearance: 44 mL/min (by C-G formula based on SCr of 0.98 mg/dL). Liver Function Tests:  Recent Labs Lab 03/19/17 2010 03/20/17 0404  AST 23 24  ALT 24 22  ALKPHOS 74 61  BILITOT 0.8 1.0  PROT 7.1 6.6  ALBUMIN 4.0 3.5   No results for input(s): LIPASE, AMYLASE in the last 168 hours. No results for input(s): AMMONIA in the last 168 hours. Coagulation Profile:  Recent Labs Lab 03/19/17 2010  INR 1.22   Cardiac Enzymes: No results for input(s): CKTOTAL, CKMB, CKMBINDEX, TROPONINI in the last 168 hours. BNP (last 3 results) No results for input(s): PROBNP in the last 8760 hours. HbA1C: No results for input(s): HGBA1C in the last 72 hours. CBG:  Recent Labs Lab 03/20/17 0335 03/20/17 1050  GLUCAP 206* 170*   Lipid Profile: No results for input(s): CHOL, HDL, LDLCALC, TRIG, CHOLHDL, LDLDIRECT in the  last 72 hours. Thyroid Function Tests:  Recent Labs  03/20/17 0404  TSH 3.410   Anemia Panel: No results for input(s): VITAMINB12, FOLATE, FERRITIN, TIBC, IRON, RETICCTPCT in the last 72 hours. Sepsis Labs: No results for input(s): PROCALCITON, LATICACIDVEN in the last 168 hours.  Recent Results (from the past 240 hour(s))  Surgical pcr screen     Status: None   Collection Time: 03/20/17  1:50 AM  Result Value Ref Range Status   MRSA, PCR NEGATIVE NEGATIVE Final   Staphylococcus aureus NEGATIVE NEGATIVE Final    Comment: (NOTE) The Xpert SA Assay (FDA approved for NASAL specimens in patients 76 years of age and older), is one component of a comprehensive surveillance program. It is not intended to diagnose infection nor to guide or monitor treatment.          Radiology Studies: Dg Chest 1 View  Result Date: 03/19/2017 CLINICAL DATA:  Pre-surgical imaging. History of diabetes and hypertension. EXAM: CHEST 1 VIEW COMPARISON:  04/20/2016 FINDINGS: Shallow inspiration with some elevation of the right hemidiaphragm. Probable atelectasis in the lung bases. No airspace disease or consolidation is suggested. Peribronchial thickening consistent with chronic bronchitis. No blunting of costophrenic angles. No pneumothorax. Calcified and tortuous aorta. Degenerative changes in the spine. IMPRESSION: Shallow inspiration with probable atelectasis in the lung bases. No evidence of active pulmonary disease. Aortic atherosclerosis. Electronically Signed   By: Burman Nieves M.D.   On: 03/19/2017 21:28   Ct Pelvis Wo Contrast  Result Date: 03/19/2017 CLINICAL DATA:  Fall with left hip pain. Pelvic fracture known or suspected. EXAM: CT PELVIS WITHOUT CONTRAST TECHNIQUE: Multidetector CT imaging of the pelvis was performed following the standard protocol without intravenous contrast. COMPARISON:  Left hip radiographs. FINDINGS: Urinary Tract: Urinary bladder is physiologically distended, no bladder  wall thickening. Bowel: Enteric sutures in the sigmoid colon. Moderate colonic stool burden in the included colon. No evidence of bowel inflammation. Vascular/Lymphatic: Vascular calcifications without aneurysm. Reproductive:  Post hysterectomy.  No adnexal mass. Other: Pelvic hemorrhage related to pelvic fractures, greater on the left. On the left this causes mild associated mass-effect on the urinary bladder. Some hyperdense material in the region of the left pelvic hemorrhage can be seen with active bleeding. Musculoskeletal: Left pelvis: Displaced left sacral fracture, zone 1 extending into the sacroiliac joint. Fracture is lateral to the sacral foramen. Fracture extends superiorly to involve the left L5 transverse process. Superior pubic ramus fracture at the puboacetabular junction, comminuted and mildly displaced. Displaced comminuted left inferior pubic ramus fracture. Right pelvis: Comminuted superior and inferior pubic rami fractures. Fractures are minimally displaced. No definite right sacral fracture. Pubic symphysis remains congruent.  Proximal femora are intact. IMPRESSION: 1. Displaced comminuted fractures of the left superior inferior pubic ramus, superior ramus fracture at the puboacetabular  junction. Displaced left sacral fracture extending into the sacroiliac joint. Left L5 transverse process fracture. 2. Displaced right superior and inferior pubic rami fractures. No definite right sacral fracture. 3. Pelvic hemorrhage related to pelvic fractures, greater on the left. Questionable active bleeding within the left pelvic hemorrhage. These results were called by telephone at the time of interpretation on 03/19/2017 at 9:57 pm to PA [facility] [place] , who verbally acknowledged these results. Electronically Signed   By: Rubye Oaks M.D.   On: 03/19/2017 21:58   Dg Hip Unilat With Pelvis 2-3 Views Left  Result Date: 03/19/2017 CLINICAL DATA:  [place] down the, left hip pain EXAM: DG HIP (WITH OR WITHOUT  PELVIS) 2-3V LEFT COMPARISON:  None. FINDINGS: Pubic symphysis is intact. There are displaced right superior and inferior pubic rami fractures. Possible vertically-oriented fracture through the right aspect of the sacrum. Superiorly displaced left inferior and superior pubic rami fractures with additional fracture suspected through the left sacrum. There is probably slight widening of the left SI joint. Both femoral heads project in joint. Vascular calcifications. Postsurgical suture in the pelvis IMPRESSION: 1. Superiorly displaced left superior and inferior pubic rami with probable vertically-oriented fracture through the left sacrum and suspected mild widening of left SI joint, suspect for vertical shear injury. 2. Displaced fractures involving the right superior and inferior pubic rami with questionable fracture lucency in the right aspect of sacrum. Critical Value/emergent results were called by telephone at the time of interpretation on 03/19/2017 at 9:00 pm to Dr. Marily Memos , who verbally acknowledged these results. Electronically Signed   By: Jasmine Pang M.D.   On: 03/19/2017 21:00        Scheduled Meds: . acyclovir  800 mg Oral BID  . atorvastatin  40 mg Oral Daily  . insulin aspart  0-9 Units Subcutaneous Q4H  . mirabegron ER  25 mg Oral Daily  . prednisoLONE acetate  1 drop Right Eye BID  . senna  1 tablet Oral BID   Continuous Infusions: . sodium chloride 100 mL/hr at 03/20/17 0604  . diltiazem (CARDIZEM) infusion 7.5 mg/hr (03/20/17 1205)  . magnesium sulfate 1 - 4 g bolus IVPB 2 g (03/20/17 1120)  . sodium chloride       LOS: 0 days     Jacquelin Hawking, MD [facility] 03/20/2017, 12:15 PM Pager: [phone]  If 7PM-7AM, please contact night-coverage [email or web address] Password [email or web address] 03/20/2017, 12:15 PM

## 2017-03-20 NOTE — Progress Notes (Signed)
Dr. Caleb PoppNettey made aware patient UOP 50cc this shift. Will bladder scan and make MD aware of result. Continuing to monitor.

## 2017-03-20 NOTE — Progress Notes (Signed)
Discussed CT results with radiologist. Worsening hematoma, which was expected. Kcentra given. Blood transfusions already running. Confirmed patient has two IVs, however, only able to get 22 gauge. Discussed with orthopedics, [name], who does not recommend surgery at this time unless patient continues to become significantly unstable; and at that time, recommends IR consult for embolism (last resort as this can complicate potential upcoming pelvic surgery). Patient's blood pressure improving with blood administration. Continue analgesics for pain management. Continue diltiazem and digoxin for heart rate control per cardiology recommendations. Critical care consulted earlier for patient secondary to tenuousness and will evaluate patient today. Patient may be better served in the ICU while she is in this tenuous stage. Orthopedic surgery will call family at bedside and update.  Jacquelin Hawkingalph Selden Noteboom, MD [facility] 03/20/2017, 5:31 PM Pager: [phone]

## 2017-03-21 ENCOUNTER — Inpatient Hospital Stay (HOSPITAL_COMMUNITY): Payer: Medicare Other

## 2017-03-21 DIAGNOSIS — I5032 Chronic diastolic (congestive) heart failure: Secondary | ICD-10-CM

## 2017-03-21 DIAGNOSIS — I9589 Other hypotension: Secondary | ICD-10-CM

## 2017-03-21 DIAGNOSIS — I4891 Unspecified atrial fibrillation: Secondary | ICD-10-CM

## 2017-03-21 DIAGNOSIS — I10 Essential (primary) hypertension: Secondary | ICD-10-CM

## 2017-03-21 DIAGNOSIS — S32811A Multiple fractures of pelvis with unstable disruption of pelvic ring, initial encounter for closed fracture: Principal | ICD-10-CM

## 2017-03-21 LAB — CBC WITH DIFFERENTIAL/PLATELET
BASOS ABS: 0 10*3/uL (ref 0.0–0.1)
BASOS PCT: 0 %
Basophils Absolute: 0 10*3/uL (ref 0.0–0.1)
Basophils Absolute: 0 10*3/uL (ref 0.0–0.1)
Basophils Absolute: 0 10*3/uL (ref 0.0–0.1)
Basophils Relative: 0 %
Basophils Relative: 0 %
Basophils Relative: 0 %
EOS ABS: 0 10*3/uL (ref 0.0–0.7)
EOS ABS: 0 10*3/uL (ref 0.0–0.7)
EOS PCT: 0 %
Eosinophils Absolute: 0 10*3/uL (ref 0.0–0.7)
Eosinophils Absolute: 0 10*3/uL (ref 0.0–0.7)
Eosinophils Relative: 0 %
Eosinophils Relative: 0 %
Eosinophils Relative: 0 %
HCT: 28.3 % — ABNORMAL LOW (ref 36.0–46.0)
HEMATOCRIT: 30 % — AB (ref 36.0–46.0)
HEMATOCRIT: 25.2 % — AB (ref 36.0–46.0)
HEMATOCRIT: 24.7 % — AB (ref 36.0–46.0)
HEMOGLOBIN: 9.9 g/dL — AB (ref 12.0–15.0)
HEMOGLOBIN: 9.2 g/dL — AB (ref 12.0–15.0)
HEMOGLOBIN: 8.5 g/dL — AB (ref 12.0–15.0)
Hemoglobin: 8.4 g/dL — ABNORMAL LOW (ref 12.0–15.0)
LYMPHS ABS: 1.9 10*3/uL (ref 0.7–4.0)
LYMPHS ABS: 1.8 10*3/uL (ref 0.7–4.0)
LYMPHS ABS: 1.4 10*3/uL (ref 0.7–4.0)
LYMPHS ABS: 1.8 10*3/uL (ref 0.7–4.0)
LYMPHS PCT: 9 %
LYMPHS PCT: 6 %
LYMPHS PCT: 8 %
Lymphocytes Relative: 8 %
MCH: 29.8 pg (ref 26.0–34.0)
MCH: 29.3 pg (ref 26.0–34.0)
MCH: 30.1 pg (ref 26.0–34.0)
MCH: 30.5 pg (ref 26.0–34.0)
MCHC: 33 g/dL (ref 30.0–36.0)
MCHC: 32.5 g/dL (ref 30.0–36.0)
MCHC: 33.7 g/dL (ref 30.0–36.0)
MCHC: 34 g/dL (ref 30.0–36.0)
MCV: 90.4 fL (ref 78.0–100.0)
MCV: 90.1 fL (ref 78.0–100.0)
MCV: 89.4 fL (ref 78.0–100.0)
MCV: 89.8 fL (ref 78.0–100.0)
MONO ABS: 2.1 10*3/uL — AB (ref 0.1–1.0)
MONOS PCT: 9 %
Monocytes Absolute: 2 10*3/uL — ABNORMAL HIGH (ref 0.1–1.0)
Monocytes Absolute: 1.6 10*3/uL — ABNORMAL HIGH (ref 0.1–1.0)
Monocytes Absolute: 1.9 10*3/uL — ABNORMAL HIGH (ref 0.1–1.0)
Monocytes Relative: 10 %
Monocytes Relative: 7 %
Monocytes Relative: 9 %
NEUTROS ABS: 16.2 10*3/uL — AB (ref 1.7–7.7)
NEUTROS ABS: 18.6 10*3/uL — AB (ref 1.7–7.7)
NEUTROS PCT: 81 %
NEUTROS PCT: 83 %
Neutro Abs: 19.2 10*3/uL — ABNORMAL HIGH (ref 1.7–7.7)
Neutro Abs: 20.5 10*3/uL — ABNORMAL HIGH (ref 1.7–7.7)
Neutrophils Relative %: 85 %
Neutrophils Relative %: 85 %
Platelets: 225 10*3/uL (ref 150–400)
Platelets: 221 10*3/uL (ref 150–400)
Platelets: 207 10*3/uL (ref 150–400)
Platelets: 212 10*3/uL (ref 150–400)
RBC: 3.32 MIL/uL — AB (ref 3.87–5.11)
RBC: 3.14 MIL/uL — AB (ref 3.87–5.11)
RBC: 2.82 MIL/uL — AB (ref 3.87–5.11)
RBC: 2.75 MIL/uL — AB (ref 3.87–5.11)
RDW: 15.2 % (ref 11.5–15.5)
RDW: 15.5 % (ref 11.5–15.5)
RDW: 15.4 % (ref 11.5–15.5)
RDW: 15.6 % — ABNORMAL HIGH (ref 11.5–15.5)
WBC: 20.1 10*3/uL — AB (ref 4.0–10.5)
WBC: 22.6 10*3/uL — AB (ref 4.0–10.5)
WBC: 24 10*3/uL — ABNORMAL HIGH (ref 4.0–10.5)
WBC: 22.4 10*3/uL — AB (ref 4.0–10.5)

## 2017-03-21 LAB — BPAM RBC
BLOOD PRODUCT EXPIRATION DATE: 201810012359
BLOOD PRODUCT EXPIRATION DATE: 201810012359
ISSUE DATE / TIME: 201809091443
ISSUE DATE / TIME: 201809091834
UNIT TYPE AND RH: 6200
Unit Type and Rh: 6200

## 2017-03-21 LAB — TYPE AND SCREEN
ABO/RH(D): A POS
Antibody Screen: NEGATIVE
Unit division: 0
Unit division: 0

## 2017-03-21 LAB — BASIC METABOLIC PANEL
ANION GAP: 11 (ref 5–15)
BUN: 36 mg/dL — ABNORMAL HIGH (ref 6–20)
CHLORIDE: 106 mmol/L (ref 101–111)
CO2: 21 mmol/L — AB (ref 22–32)
CREATININE: 2.66 mg/dL — AB (ref 0.44–1.00)
Calcium: 8.2 mg/dL — ABNORMAL LOW (ref 8.9–10.3)
GFR calc non Af Amer: 15 mL/min — ABNORMAL LOW (ref >60)
GFR, EST AFRICAN AMERICAN: 18 mL/min — AB (ref >60)
Glucose, Bld: 156 mg/dL — ABNORMAL HIGH (ref 65–99)
POTASSIUM: 4.9 mmol/L (ref 3.5–5.1)
SODIUM: 138 mmol/L (ref 135–145)

## 2017-03-21 LAB — GLUCOSE, CAPILLARY
GLUCOSE-CAPILLARY: 160 mg/dL — AB (ref 65–99)
GLUCOSE-CAPILLARY: 151 mg/dL — AB (ref 65–99)
GLUCOSE-CAPILLARY: 164 mg/dL — AB (ref 65–99)
Glucose-Capillary: 156 mg/dL — ABNORMAL HIGH (ref 65–99)
Glucose-Capillary: 147 mg/dL — ABNORMAL HIGH (ref 65–99)

## 2017-03-21 LAB — HEMOGLOBIN A1C
Hgb A1c MFr Bld: 6.2 % — ABNORMAL HIGH (ref 4.8–5.6)
Mean Plasma Glucose: 131 mg/dL

## 2017-03-21 IMAGING — DX DG PELVIS 3+V JUDET
4 series · 4 of 4 positions shown · non-contrast
Comparison: CT pelvis dated 03/20/2017

CLINICAL DATA: Fall, pelvic fracture

EXAM:
JUDET PELVIS - 3+ VIEW

[pelvis ap]
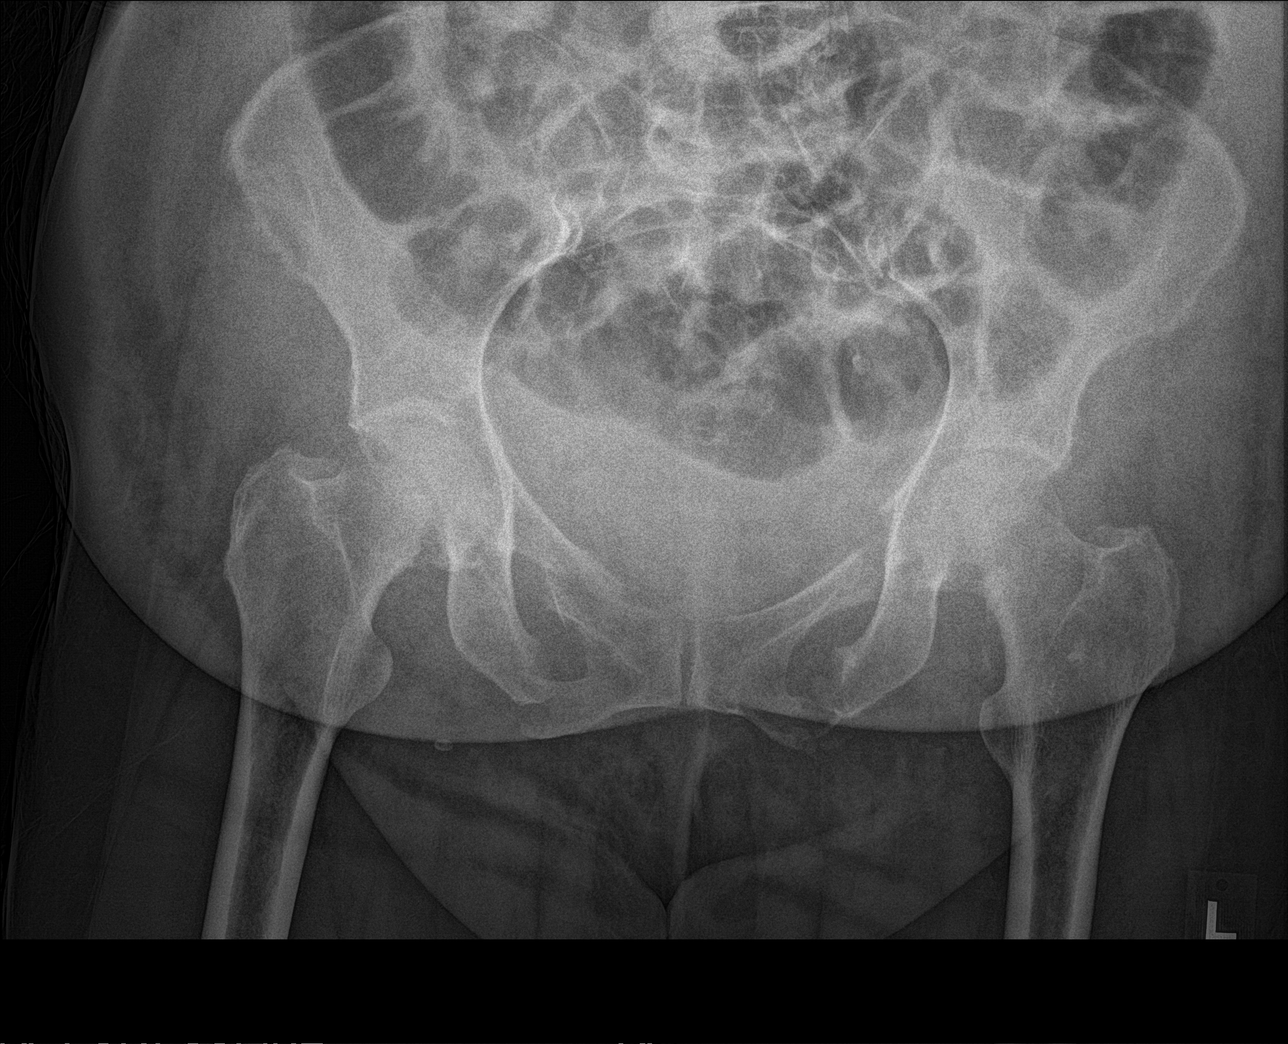

[pelvis obl (1 of 3)]
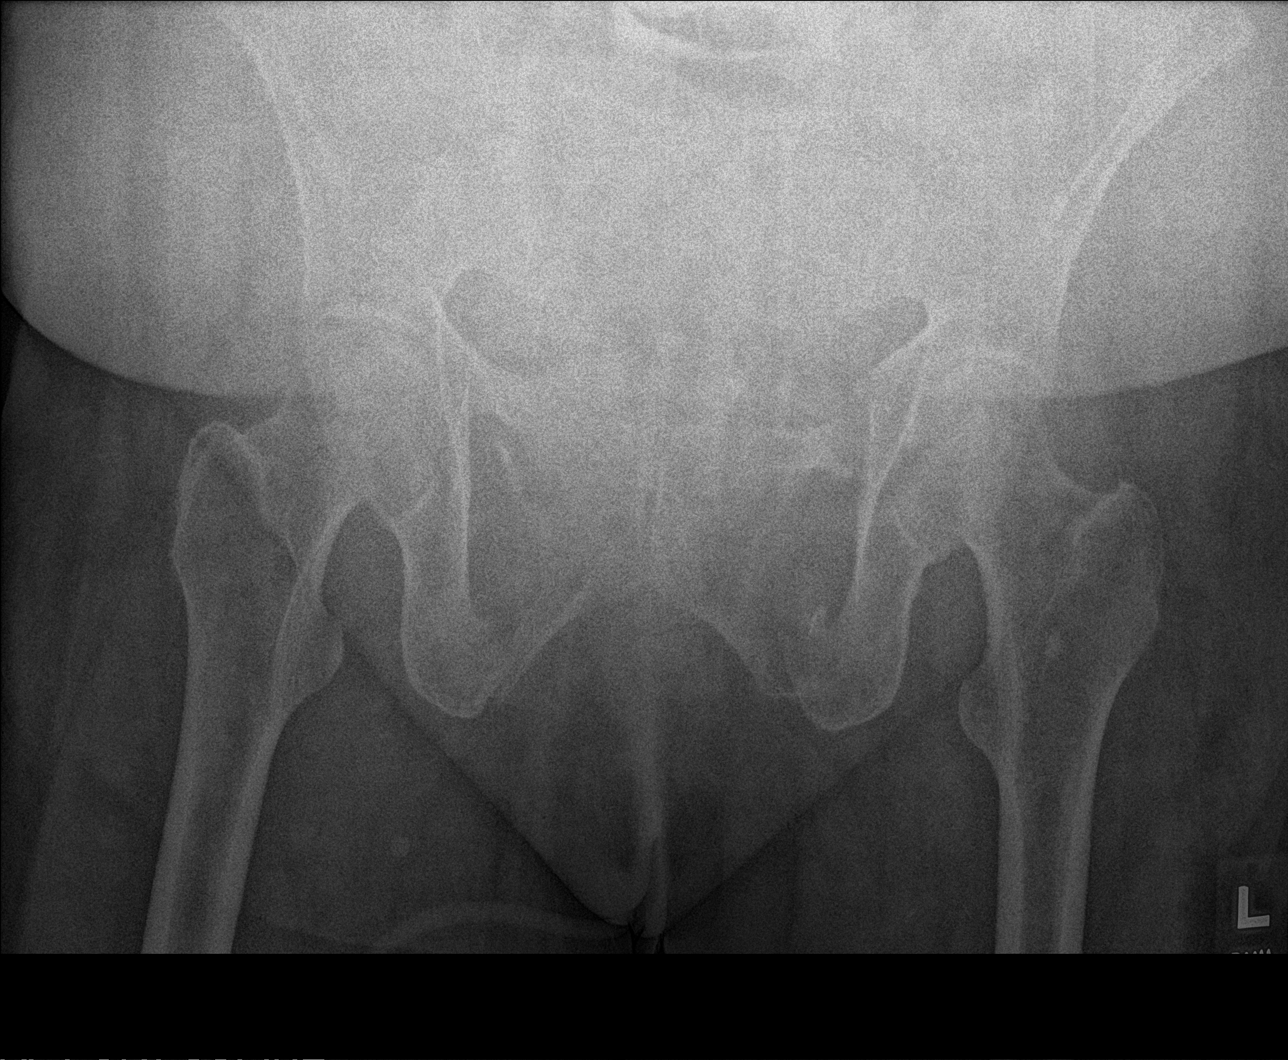

[pelvis obl (2 of 3)]
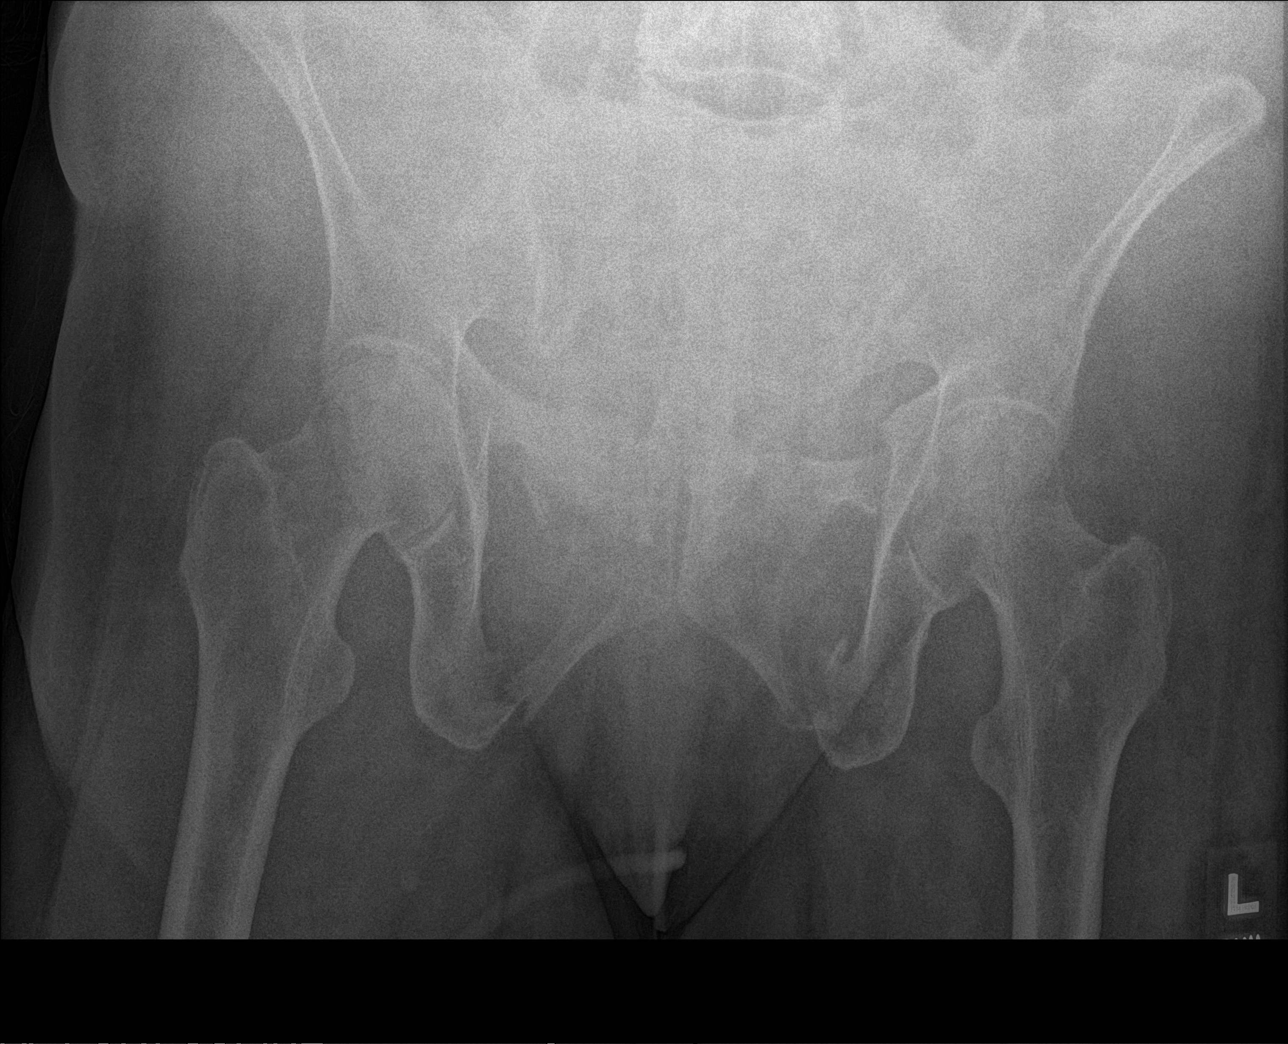

[pelvis obl (3 of 3)]
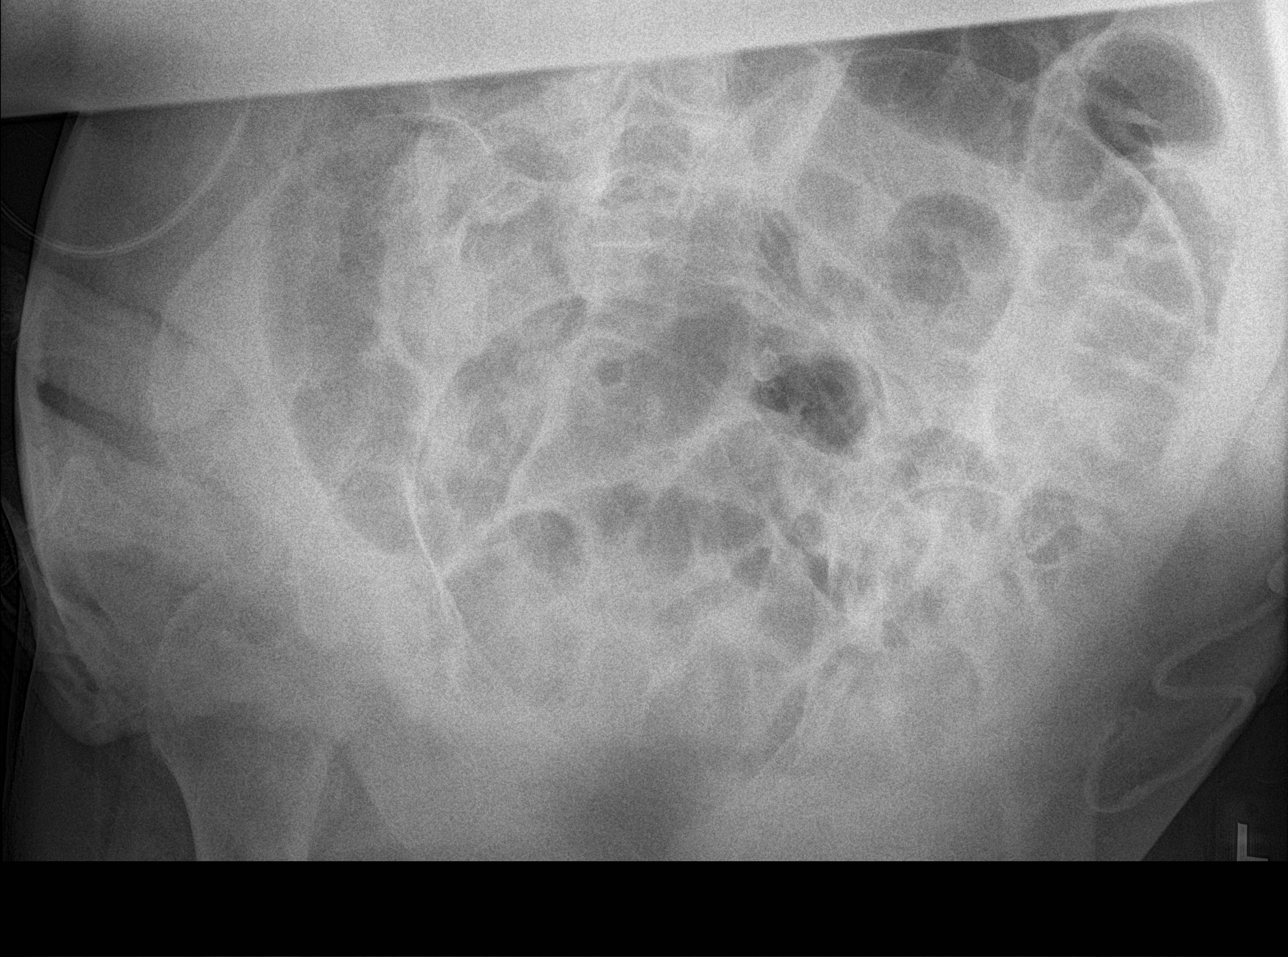

[4 of 4 positions shown; findings below may reference images not displayed]

FINDINGS: Bilateral pelvic ring fractures, better evaluated on CT.

Known left sacral fracture is not well visualized due to overlying
bowel gas.
IMPRESSION: Bilateral pelvic ring fractures, better evaluated on CT.

Known left sacral fracture is not well visualized due to overlying
bowel gas.

## 2017-03-21 MED ORDER — AMIODARONE LOAD VIA INFUSION
150.0000 mg | Freq: Once | INTRAVENOUS | Status: AC
  Administered 2017-03-21: 150 mg via INTRAVENOUS
  Filled 2017-03-21: qty 83.34

## 2017-03-21 MED ORDER — SODIUM CHLORIDE 0.9 % IV SOLN
INTRAVENOUS | Status: DC
  Administered 2017-03-21 – 2017-03-26 (×8): via INTRAVENOUS
  Administered 2017-03-26: 1000 mL via INTRAVENOUS
  Administered 2017-03-27 – 2017-03-28 (×2): via INTRAVENOUS

## 2017-03-21 MED ORDER — AMIODARONE HCL IN DEXTROSE 360-4.14 MG/200ML-% IV SOLN
60.0000 mg/h | INTRAVENOUS | Status: AC
  Administered 2017-03-21: 60 mg/h via INTRAVENOUS
  Filled 2017-03-21 (×2): qty 200

## 2017-03-21 MED ORDER — AMIODARONE HCL IN DEXTROSE 360-4.14 MG/200ML-% IV SOLN
30.0000 mg/h | INTRAVENOUS | Status: DC
  Administered 2017-03-21: 59.94 mg/h via INTRAVENOUS
  Administered 2017-03-21: 30 mg/h via INTRAVENOUS
  Administered 2017-03-22 (×2): 60 mg/h via INTRAVENOUS
  Administered 2017-03-22: 30 mg/h via INTRAVENOUS
  Administered 2017-03-23: 60 mg/h via INTRAVENOUS
  Administered 2017-03-23 – 2017-03-30 (×14): 30 mg/h via INTRAVENOUS
  Filled 2017-03-21 (×19): qty 200

## 2017-03-21 NOTE — Consult Note (Signed)
[facility] (OTS) Consult   Patient ID: Tracey Porter MRN: [id number] DOB/AGE: 11-30-1929 83 y.o.   Reason for Consult: Evaluation of pelvic fracture Referring Physician: Margarita Rana, MD  HPI: Tracey Porter is an 83 y.o. female who presented to the emergency room after a ground level fall. She was on eliquis for a fib and subsequently developed a drop in her hemoglobin and required a blood transfusion. She has done better overnight. Pain has been well controlled upon my exam this morning and this afternoon. Daughters are both at bedside.  Mainly she is complaining of pain in her posterior pelvis and some pain anteriorly on the right side. She does not feel that her pelvis is grossly unstable when she moves it around but it is uncomfortable. Denies numbness or tingling. She had not gotten out of bed when I saw her this AM.  Prior to this ground level fall, she had been ambulating without assistive device. She was functional and living at home alone. She is on eliquis for her A fib. No other significant medical problems that she relates to me.  Past Medical History:  Diagnosis Date  . Diabetes mellitus without complication (HCC)   . Hypertension     Past Surgical History:  Procedure Laterality Date  . ABDOMINAL HYSTERECTOMY  1976  . EYE SURGERY      Family History  Problem Relation Age of Onset  . Lung cancer Father   . Lymphoma Daughter     Social History:  reports that she has quit smoking. She has never used smokeless tobacco. She reports that she drinks alcohol. She reports that she does not use drugs.  Allergies:  Allergies  Allergen Reactions  . Penicillins Rash    Medications: I have reviewed the patient's current medications.  ROS: Negative for bowel or bladder dysfunction, no numbness or tingling, no chest pain or SOB. No dizziness or lightheadedness. No confusion  Blood pressure 132/77, pulse 79, temperature 98.8 F (37.1 C), temperature  source Oral, resp. rate 16, height  (1.676 m), weight 77.1 kg (170 lb), SpO2 96 %. Exam: AAOx3, cooperative and pleasant. BUE: atraumatic with full strength and ROM without instability Pelvis/BLE: Stability not assessed with AP/lateral compression due to patient discomfort. Hip flexion on Right without pain, negative log roll on R side. Left side pain with hip flexion but minimal pain with log roll. Motor and sensory intact distally. No tenderness about either legs. Some pain with palpation over anterior pelvic ring bilaterally. +TTP posteriorly.  Imaging: AP pelvis with inlet and outlet views are reviewed which show bilateral superior and inferior pubic rami fracture with mild superior translation of left sided fractures  CT scan shows the above with a zone 1 complete sacrum fracture with signigicant osteopenia  Assessment/Plan: 83 year old female with potentially unstable pelvic ring injury.  Discussed radiographic findings and treatment options extensively with patient and her daughters. Imaging on radiographs and CT scan would suggest an unstable pattern to her pelvic ring. However it is a low-energy mechanism and the CT scan is well aligned. Her hemoglobin has stabilized and I do not feel that her pelvic ring injury should cause for bleeding or worsening hematoma at this point.  I discussed nonoperative treatment is an option with mobilization with therapy and post mobilization radiographs. If she is unable to mobilize or has significant shift on her radiographs a surgery may be warranted. I did discuss that she is at a higher risk of nonunion and a potential  larger ORIF in the future if she does not heal. Surgical options include a stress under anesethsia with percutaneous fixation of her left pelvis. The risks with this include bleeding, infection and damage to nerves and blood vessel and risk for nonunion and loss of fixation with her poor bone stock. Weight bearing would not change with  or without surgery  The patient and her family would like an attempt at treatment without surgery.  -TDWB LLE -Mobilize with physical therapy and get post-mobilization radiographs -If unable to successfully mobilize or x-rays show large shift will plan to proceed with percutaneous fixation -Will discuss with patient and family again tomorrow. -Tentatively posted for surgery Wed 9/12 but will cancel if doing well with therapy. -No need for traction or bedrest precautions.  Roby LoftsKevin P. Allis Quirarte, MD [facility] [phone] (phone)

## 2017-03-21 NOTE — Evaluation (Addendum)
Physical Therapy Evaluation Patient Details Name: Tracey Porter MRN: [id number] DOB: [birth year] Today's Date: 03/21/2017   History of Present Illness  Pt is an 83 yo female admitted after a fall with pelvic fx w associated hemorrhage.  Pt with mild dementia, h/o afib, currently in afib with RVR. Pt was in traction but now out of traction and attempting conservative management with TDWB on LLE and WBAT RLE.  Clinical Impression  Patient presents with baseline dementia, generalized weakness, pain, soft BP and impaired mobility s/p above. Requires assist of 2 for bed mobility as pain is pt's biggest limiting factor. Pt reluctant to bear weight through BLEs; able to attain partial stand clearing bottom with total A of 2. Instructed pt in LE exercises to perform. Pt also with soft BP and diaphoresis sitting EOB reading as low as 77/41, improving to low 100s systolically once returning to supine. Pt independent PTA with some assist for IADLs. Has supportive daughters. Would benefit from SNF to maximize independence and mobility prior to return home. Will follow acutely.    Follow Up Recommendations SNF;Supervision for mobility/OOB    Equipment Recommendations  Other (comment) (TBA)    Recommendations for Other Services       Precautions / Restrictions Precautions Precautions: Fall Restrictions Weight Bearing Restrictions: Yes RLE Weight Bearing: Weight bearing as tolerated LLE Weight Bearing: Touchdown weight bearing Other Position/Activity Restrictions: Pt unable to bear weight on legs today.      Mobility  Bed Mobility Overal bed mobility: Needs Assistance Bed Mobility: Supine to Sit;Sit to Supine     Supine to sit: Total assist;+2 for physical assistance Sit to supine: Total assist;+2 for physical assistance   General bed mobility comments: Pt c/o pain in pelvic area L greater than R affecting ability to move in bed. Assist with LEs, trunk and scooting bottom to  EOB.  Transfers Overall transfer level: Needs assistance Equipment used: Rolling walker (2 wheeled) Transfers: Sit to/from Stand Sit to Stand: Total assist;+2 physical assistance         General transfer comment: Able to perform partial stand but not bearing weight through LEs due to pain. + diaphoresis.  Ambulation/Gait                Stairs            Wheelchair Mobility    Modified Rankin (Stroke Patients Only)       Balance Overall balance assessment: Needs assistance Sitting-balance support: Feet supported;Bilateral upper extremity supported Sitting balance-Leahy Scale: Fair Sitting balance - Comments: Pt able to sit at times without assist.  Min guard provided at times due to feeling dizzy.   Standing balance support: During functional activity Standing balance-Leahy Scale: Zero Standing balance comment: pt unable to stand                             Pertinent Vitals/Pain Pain Assessment: 0-10 Pain Score: 6  Pain Location: pelvis L more than R Pain Descriptors / Indicators: Aching;Grimacing;Guarding Pain Intervention(s): Monitored during session;Repositioned    Home Living Family/patient expects to be discharged to:: Private residence Living Arrangements: Alone Available Help at Discharge: Family;Available 24 hours/day Type of Home: House Home Access: Level entry     Home Layout: One level Home Equipment: Walker - 2 wheels Additional Comments: Family has talked about renovating a shower and getting a higher commode.    Prior Function Level of Independence: Needs assistance   Gait / Transfers  Assistance Needed: has taken 2 falls in last 6 months but does not use assistive device  ADL's / Homemaking Assistance Needed: has an aide 3x a week to assist more with cleaning and cooking but occasionally supervision in shower.  Comments: pt does own finances and daughter checks them     Hand Dominance   Dominant Hand: Right     Extremity/Trunk Assessment   Upper Extremity Assessment Upper Extremity Assessment: Defer to OT evaluation    Lower Extremity Assessment Lower Extremity Assessment: Generalized weakness (Limited mobility due to pain. )    Cervical / Trunk Assessment Cervical / Trunk Assessment: Other exceptions Cervical / Trunk Exceptions: pelvic fx  Communication   Communication: No difficulties  Cognition Arousal/Alertness: Awake/alert Behavior During Therapy: WFL for tasks assessed/performed Overall Cognitive Status: History of cognitive impairments - at baseline                                 General Comments: Pt has h/o mild dementia and did seem to have difficulty with problem solving but this may be her baseline.      General Comments General comments (skin integrity, edema, etc.): BP 106/60 supine, BP 77/41 sitting EOB, BP 96/55 after 2 mins; BP 101/75 once returned to supine.    Exercises     Assessment/Plan    PT Assessment Patient needs continued PT services  PT Problem List Decreased strength;Decreased mobility;Decreased safety awareness;Decreased knowledge of precautions;Decreased range of motion;Decreased balance;Pain;Decreased activity tolerance;Decreased cognition;Cardiopulmonary status limiting activity       PT Treatment Interventions Therapeutic activities;Gait training;Therapeutic exercise;Patient/family education;Functional mobility training;Balance training;DME instruction    PT Goals (Current goals can be found in the Care Plan section)  Acute Rehab PT Goals Patient Stated Goal: to feel less pain PT Goal Formulation: With patient Time For Goal Achievement: 04/04/17 Potential to Achieve Goals: Fair    Frequency Min 3X/week   Barriers to discharge Decreased caregiver support      Co-evaluation PT/OT/SLP Co-Evaluation/Treatment: Yes Reason for Co-Treatment: For patient/therapist safety;To address functional/ADL transfers PT goals addressed  during session: Mobility/safety with mobility OT goals addressed during session: ADL's and self-care       AM-PAC PT "6 Clicks" Daily Activity  Outcome Measure Difficulty turning over in bed (including adjusting bedclothes, sheets and blankets)?: Unable Difficulty moving from lying on back to sitting on the side of the bed? : Unable Difficulty sitting down on and standing up from a chair with arms (e.g., wheelchair, bedside commode, etc,.)?: Unable Help needed moving to and from a bed to chair (including a wheelchair)?: Total Help needed walking in hospital room?: Total Help needed climbing 3-5 steps with a railing? : Total 6 Click Score: 6    End of Session Equipment Utilized During Treatment: Gait belt Activity Tolerance: Patient limited by pain;Treatment limited secondary to medical complications (Comment) (low BP, diaphoresis) Patient left: in bed;with call bell/phone within reach;with family/visitor present;with SCD's reapplied Nurse Communication: Mobility status;Other (comment) (BP) PT Visit Diagnosis: Unsteadiness on feet (R26.81);Dizziness and giddiness (R42);Muscle weakness (generalized) (M62.81);Pain Pain - part of body:  (pelvis)    Time: [id number] PT Time Calculation (min) (ACUTE ONLY): 37 min   Charges:   PT Evaluation $PT Eval Moderate Complexity: 1 Mod     PT G CodesMylo Red:        Maimuna Leaman, PT, DPT [phone]    Blake DivineShauna A Thuy Atilano 03/21/2017, 12:33 PM

## 2017-03-21 NOTE — Progress Notes (Addendum)
Progress Note  Patient Name: Tracey Porter Date of Encounter: 03/21/2017  Primary Cardiologist: Dr. Nicholaus BloomKelley  Subjective   Complains of pelvic pain.  Denies any chest pain or SOB  Inpatient Medications    Scheduled Meds: . acyclovir  800 mg Oral BID  . atorvastatin  40 mg Oral Daily  . insulin aspart  0-9 Units Subcutaneous Q4H  . mirabegron ER  25 mg Oral Daily  . prednisoLONE acetate  1 drop Right Eye BID  . senna  1 tablet Oral BID   Continuous Infusions: . sodium chloride 100 mL/hr at 03/20/17 2107  . sodium chloride    . diltiazem (CARDIZEM) infusion 12.5 mg/hr (03/21/17 0547)   PRN Meds: acetaminophen **OR** acetaminophen, HYDROcodone-acetaminophen, morphine injection, ondansetron **OR** ondansetron (ZOFRAN) IV   Vital Signs    Vitals:   03/21/17 0600 03/21/17 0630 03/21/17 0700 03/21/17 0757  BP: (!) 126/59 125/73 125/89 (!) 141/82  Pulse: 70 (!) 107 (!) 142 (!) 106  Resp: 16 13 13 18   Temp:    98.8 F (37.1 C)  TempSrc:    Oral  SpO2: 98% 97% 96% 93%  Weight:      Height:        Intake/Output Summary (Last 24 hours) at 03/21/17 0850 Last data filed at 03/21/17 0700  Gross per 24 hour  Intake          5195.01 ml  Output               50 ml  Net          5145.01 ml   Filed Weights   03/20/17 0150  Weight: 170 lb (77.1 kg)    Telemetry    Atrial fibrillation with RVR with HR 110-130's - Personally Reviewed  ECG    No new EKG to review - Personally Reviewed  Physical Exam   GEN: No acute distress.   Neck: No JVD Cardiac: irregualarly irregular and tachy, no murmurs, rubs, or gallops.  Respiratory: Clear to auscultation bilaterally. GI: Soft, nontender, non-distended  MS: No edema; No deformity. Neuro:  Nonfocal  Psych: Normal affect   Labs    Chemistry Recent Labs Lab 03/19/17 2010 03/20/17 0404 03/21/17 0226  NA 137 136 138  K 3.5 4.1 4.9  CL 101 101 106  CO2 24 24 21*  GLUCOSE 135* 239* 156*  BUN 21* 21* 36*    CREATININE 0.86 0.98 2.66*  CALCIUM 9.4 8.9 8.2*  PROT 7.1 6.6  --   ALBUMIN 4.0 3.5  --   AST 23 24  --   ALT 24 22  --   ALKPHOS 74 61  --   BILITOT 0.8 1.0  --   GFRNONAA 60* 51* 15*  GFRAA >60 59* 18*  ANIONGAP 12 11 11      Hematology Recent Labs Lab 03/20/17 2258 03/21/17 0226 03/21/17 0740  WBC 19.2* 20.1* 22.6*  RBC 3.46* 3.32* 3.14*  HGB 10.2* 9.9* 9.2*  HCT 31.3* 30.0* 28.3*  MCV 90.5 90.4 90.1  MCH 29.5 29.8 29.3  MCHC 32.6 33.0 32.5  RDW 14.8 15.2 15.5  PLT 213 225 221    Cardiac EnzymesNo results for input(s): TROPONINI in the last 168 hours. No results for input(s): TROPIPOC in the last 168 hours.   BNPNo results for input(s): BNP, PROBNP in the last 168 hours.   DDimer No results for input(s): DDIMER in the last 168 hours.   Radiology    Dg Chest 1 View  Result  Date: 03/19/2017 CLINICAL DATA:  Pre-surgical imaging. History of diabetes and hypertension. EXAM: CHEST 1 VIEW COMPARISON:  04/20/2016 FINDINGS: Shallow inspiration with some elevation of the right hemidiaphragm. Probable atelectasis in the lung bases. No airspace disease or consolidation is suggested. Peribronchial thickening consistent with chronic bronchitis. No blunting of costophrenic angles. No pneumothorax. Calcified and tortuous aorta. Degenerative changes in the spine. IMPRESSION: Shallow inspiration with probable atelectasis in the lung bases. No evidence of active pulmonary disease. Aortic atherosclerosis. Electronically Signed   By: Burman Nieves M.D.   On: 03/19/2017 21:28   Ct Pelvis Wo Contrast  Result Date: 03/20/2017 CLINICAL DATA:  Follow up pelvic fracture. Initial encounter. EXAM: CT PELVIS WITHOUT CONTRAST TECHNIQUE: Multidetector CT imaging of the pelvis was performed following the standard protocol without intravenous contrast. COMPARISON:  Yesterday FINDINGS: Urinary Tract: The bladder is displaced to the left and partially effaced. Bowel: Postoperative rectosigmoid junction  for uncertain indication. No visible bowel injury. Vascular/Lymphatic: Bilateral pelvic hematoma, significantly increased on the right compared to prior, where there is tracking into the lower abdominal extraperitoneal space. Right pelvic sidewall hematoma measures up to 12 cm by 8 cm on axial slices. The left pelvic high-density areas noted on previous CT are likely lymph nodes given their stable density and shape. There is atherosclerotic calcification. 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 Reproductive:  Hysterectomy.  Pelvic floor laxity. Other:  No pneumoperitoneum or definite peritoneal fluid. Musculoskeletal: Left obturator ring fractures through the mid inferior pubic ramus at the puboacetabular junction. There is left sacral ala fracture extending to the sacroiliac joint inferiorly; no displacement along the sacral foramina. Segmental fracturing of the right inferior pubic ramus and single fracture in the right superior pubic ramus. No pelvic diastasis. No proximal hip fracture or dislocation. L5 left transverse process fracture. Lower lumbar disc and facet degeneration with grade 1 L4-5 anterolisthesis. Critical Value/emergent results were called by telephone at the time of interpretation on 03/20/2017 at 4:51 pm to Dr. Jacquelin Hawking , who verbally acknowledged these results. IMPRESSION: 1. Large extraperitoneal pelvic hematoma, especially on the right, significantly increased from previous scan. Right-sided hematoma measures up to 12 x 8 cm. 2. Bilateral obturator ring, left L5 transverse process, and left sacral ala fractures as described. 3. Atherosclerosis. Intimal plaque distortion at the right common and external iliac suggests remote dissection. Electronically Signed   By: Marnee Spring M.D.   On: 03/20/2017 16:56   Ct Pelvis Wo Contrast  Result Date: 03/19/2017 CLINICAL DATA:  Fall with left hip pain. Pelvic fracture known or suspected.  EXAM: CT PELVIS WITHOUT CONTRAST TECHNIQUE: Multidetector CT imaging of the pelvis was performed following the standard protocol without intravenous contrast. COMPARISON:  Left hip radiographs. FINDINGS: Urinary Tract: Urinary bladder is physiologically distended, no bladder wall thickening. Bowel: Enteric sutures in the sigmoid colon. Moderate colonic stool burden in the included colon. No evidence of bowel inflammation. Vascular/Lymphatic: Vascular calcifications without aneurysm. Reproductive:  Post hysterectomy.  No adnexal mass. Other: Pelvic hemorrhage related to pelvic fractures, greater on the left. On the left this causes mild associated mass-effect on the urinary bladder. Some hyperdense material in the region of the left pelvic hemorrhage can be seen with active bleeding. Musculoskeletal: Left pelvis: Displaced left sacral fracture, zone 1 extending into the sacroiliac joint. Fracture is lateral to the sacral foramen. Fracture extends superiorly to involve the left L5 transverse process. Superior pubic ramus fracture at the puboacetabular junction, comminuted  and mildly displaced. Displaced comminuted left inferior pubic ramus fracture. Right pelvis: Comminuted superior and inferior pubic rami fractures. Fractures are minimally displaced. No definite right sacral fracture. Pubic symphysis remains congruent.  Proximal femora are intact. IMPRESSION: 1. Displaced comminuted fractures of the left superior inferior pubic ramus, superior ramus fracture at the puboacetabular junction. Displaced left sacral fracture extending into the sacroiliac joint. Left L5 transverse process fracture. 2. Displaced right superior and inferior pubic rami fractures. No definite right sacral fracture. 3. Pelvic hemorrhage related to pelvic fractures, greater on the left. Questionable active bleeding within the left pelvic hemorrhage. These results were called by telephone at the time of interpretation on 03/19/2017 at 9:57 pm to PA  [facility] , who verbally acknowledged these results. Electronically Signed   By: Rubye Oaks M.D.   On: 03/19/2017 21:58   Dg Hip Unilat With Pelvis 2-3 Views Left  Result Date: 03/19/2017 CLINICAL DATA:  Larey Seat down the, left hip pain EXAM: DG HIP (WITH OR WITHOUT PELVIS) 2-3V LEFT COMPARISON:  None. FINDINGS: Pubic symphysis is intact. There are displaced right superior and inferior pubic rami fractures. Possible vertically-oriented fracture through the right aspect of the sacrum. Superiorly displaced left inferior and superior pubic rami fractures with additional fracture suspected through the left sacrum. There is probably slight widening of the left SI joint. Both femoral heads project in joint. Vascular calcifications. Postsurgical suture in the pelvis IMPRESSION: 1. Superiorly displaced left superior and inferior pubic rami with probable vertically-oriented fracture through the left sacrum and suspected mild widening of left SI joint, suspect for vertical shear injury. 2. Displaced fractures involving the right superior and inferior pubic rami with questionable fracture lucency in the right aspect of sacrum. Critical Value/emergent results were called by telephone at the time of interpretation on 03/19/2017 at 9:00 pm to Dr. Marily Memos , who verbally acknowledged these results. Electronically Signed   By: Jasmine Pang M.D.   On: 03/19/2017 21:00    Cardiac Studies   2D echo 05/19/2016 Study Conclusions  - Left ventricle: The cavity size was normal. Wall thickness was   normal. Systolic function was vigorous. The estimated ejection   fraction was in the range of 65% to 70%. Wall motion was normal;   there were no regional wall motion abnormalities. Features are   consistent with a pseudonormal left ventricular filling pattern,   with concomitant abnormal relaxation and increased filling   pressure (grade 2 diastolic dysfunction). - Aortic valve: There was mild stenosis. - Mitral  valve: Calcified annulus. Mildly calcified leaflets .   There was mild to moderate regurgitation. - Left atrium: The atrium was severely dilated. - Tricuspid valve: There was moderate regurgitation. - Pulmonary arteries: PA peak pressure: 44 mm Hg (S).  Nuclear stress test 02/22/2017 Study Highlights     Nuclear stress EF: 60%.  The left ventricular ejection fraction is normal (55-65%).  There was no ST segment deviation noted during stress.  The study is normal.  This is a low risk study.   Low risk stress nuclear study with normal perfusion and normal left ventricular regional and global systolic function. Atrial fibrillation during the study.     Patient Profile     83 y.o. female with a history of hypertension, diabetes and permanent atrial fibrillation.  She had a myocardial perfusion scan done that showed an EF of around 60%.  She had an admission at [facility] for a hypertensive crisis and EF was reported to be low  there however EF in 2017 was 60-65%.  She recently had been seen and her diltiazem had been increased because of increased ventricular response.  He had a negative myocardial perfusion scan with an EF of around 60% on August 4.  She had a mechanical fall and was admitted with a pelvic fracture with a question of active bleeding noted on CT scan.  She has become hypotensive and required fluid boluses.  Her hemoglobin dropped from 12.3-8.9.     Assessment & Plan    1.  Persistent atrial fibrillation - HR remains poorly controlled on IV Cardizem gtt at 12.5mg /hr. I don't think that we will get her HR better controlled with increased doses of Cardizem.  I will start her on IV Amio for better rate control which she will likely need as well in periop setting if she needs surgery for her pelvic fx.  She is currently off anticoagulation due to pelvic bleed and anemia and timing of restarting anticoagulation will need to be determined by ortho.  This was discussed at  length with her daughters today.  They are concerned about her stroke risk from being off anticoagulation and in afib.  They understand that there is an increased risk of CVA but that her greater risk is on ongoing bleeding at this time and that anticoagulation in this setting is contraindicated for now.  I explained that there is a small chance of converting her to NSR on IV Amio but options for rate control are limited at this time due to borderline soft BP (SBP currently).  2.  Hypotension - this has resolved with blood products and volume resuscitation. SBP in low 100's.  3.  Chronic diastolic CHF with hypertensive heart disease - recent CHF exacerbation 2 months ago at [facility] in setting of hypertensive urgency - she appears euvolemic on exam today  - 2D echo a year ago was normal but then apparently repeated at [facility] and EF 45-50% recently but followup nuclear stress test showed normal LVF.   4.  Mild dementia by history  5.  Aortic atherosclerosis  6.  Moderate pulmonary HTN on echo 01/2017 at [facility] with PASP .    I have spent a total of 35 minutes with patient reviewing hospital and office notes including recent admission to [facility] , telemetry, EKGs, labs and examining patient as well as establishing an assessment and plan that was discussed with the patient.  > 50% of time was spent in direct patient care.    For questions or updates, please contact [facility] Please consult [email or web address] for contact info under Cardiology/STEMI. Daytime calls, contact the Day Call APP (6a-8a) or assigned team (Teams A-D) provider (7:30a - 5p). All other daytime calls (7:30-5p), contact the Card Master @ [phone].   Nighttime calls, contact the assigned APP (5p-8p) or MD (6:30p-8p). Overnight calls (8p-6a), contact the on call Fellow @ [phone].      Signed, Armanda Magic, MD  03/21/2017, 8:50 AM

## 2017-03-21 NOTE — Progress Notes (Signed)
PT Cancellation Note  Patient Details Name: Tracey PingConstance Porter MRN: [id number] DOB: 09/17/1929   Cancelled Treatment:    Reason Eval/Treat Not Completed: Medical issues which prohibited therapy pt on strict bedrest. Awaiting possible surgery. Will follow.   [name] 03/21/2017, 8:21 AM [name], PT, DPT [phone]

## 2017-03-21 NOTE — Evaluation (Signed)
Occupational Therapy Evaluation Patient Details Name: Tracey Porter MRN: [id number] DOB: [birth year] Today's Date: 03/21/2017    History of Present Illness Pt is an 83 yo female admitted after a fall with pelvic fx w associated hemorrhage.  Pt with mild dementia, h/o afib, currently in afib with RVR. Pt was in traction but now out of traction and attempting conservative management with TDWB on LLE and WBAT RLE.   Clinical Impression   Pt admitted with the above diagnosis and has the deficits listed below. Pt would benefit from cont OT to increase independence with basic adls and eventually return home.  Pt currently dependent with most mobility and unable to weight bear through BLE even though allowed to.  Feel this pt will need SNF before returning home.  Pt with supportive daughters.    Follow Up Recommendations  SNF;Supervision/Assistance - 24 hour    Equipment Recommendations  3 in 1 bedside commode;Tub/shower seat    Recommendations for Other Services       Precautions / Restrictions Precautions Precautions: Fall Restrictions Weight Bearing Restrictions: Yes RLE Weight Bearing: Weight bearing as tolerated LLE Weight Bearing: Touchdown weight bearing Other Position/Activity Restrictions: Pt unable to bear weight on legs today.      Mobility Bed Mobility Overal bed mobility: Needs Assistance Bed Mobility: Supine to Sit;Sit to Supine     Supine to sit: Total assist;+2 for physical assistance Sit to supine: Total assist;+2 for physical assistance   General bed mobility comments: Pt c/o pain in pelvic area L greater than R affecting ability to move in bed.  Transfers Overall transfer level: Needs assistance Equipment used: Rolling walker (2 wheeled);2 person hand held assist Transfers: Sit to/from Stand Sit to Stand: Total assist;+2 physical assistance (pt unable to clear bottom off bed; unable to bear weight Bly)         General transfer comment: Pt unable to  stand on this date b/c of pain.    Balance Overall balance assessment: Needs assistance Sitting-balance support: Feet supported;Bilateral upper extremity supported Sitting balance-Leahy Scale: Fair Sitting balance - Comments: Pt able to sit at times without assist.  Min guard provided at times due to feeling dizzy.       Standing balance comment: pt unable to stand                           ADL either performed or assessed with clinical judgement   ADL Overall ADL's : Needs assistance/impaired Eating/Feeding: Set up;Sitting   Grooming: Set up;Sitting   Upper Body Bathing: Set up;Sitting   Lower Body Bathing: Total assistance;Sit to/from stand;Cueing for compensatory techniques   Upper Body Dressing : Sitting;Minimal assistance   Lower Body Dressing: Total assistance;+2 for physical assistance;Sitting/lateral leans Lower Body Dressing Details (indicate cue type and reason): pt unable to tolerate standing today Toilet Transfer: Total assistance;+2 for physical assistance Toilet Transfer Details (indicate cue type and reason): pt unable to tolerate standing today Toileting- Clothing Manipulation and Hygiene: +2 for physical assistance;Bed level Toileting - Clothing Manipulation Details (indicate cue type and reason): Pt rolls side to side due to inablility to bear weight through legs today.     Functional mobility during ADLs: Total assistance;+2 for physical assistance General ADL Comments: Pt able to do UE adls sitting up in bed but cannot tolerate sitting too high in bed due to pain.  Pt dependent for all LE adls due to pain and decreased mobility.     Vision  Baseline Vision/History: Wears glasses Wears Glasses: Reading only Patient Visual Report: No change from baseline Vision Assessment?: No apparent visual deficits     Perception Perception Perception Tested?: No   Praxis Praxis Praxis tested?: Within functional limits    Pertinent Vitals/Pain Pain  Assessment: 0-10 Pain Score: 6  Pain Location: pelvis L more than R Pain Descriptors / Indicators: Aching;Grimacing;Guarding Pain Intervention(s): Limited activity within patient's tolerance;Monitored during session;Premedicated before session;Repositioned;Relaxation     Hand Dominance Right   Extremity/Trunk Assessment Upper Extremity Assessment Upper Extremity Assessment: Overall WFL for tasks assessed   Lower Extremity Assessment Lower Extremity Assessment: Defer to PT evaluation   Cervical / Trunk Assessment Cervical / Trunk Assessment: Other exceptions Cervical / Trunk Exceptions: pelvic fx   Communication Communication Communication: No difficulties   Cognition Arousal/Alertness: Awake/alert Behavior During Therapy: WFL for tasks assessed/performed Overall Cognitive Status: History of cognitive impairments - at baseline                                 General Comments: Pt has h/o mild dementia and did seem to have difficulty with problem solving but this may be her baseline.   General Comments  Pt with low BP to start session at 77/41 and recovered to 96/55 sitting on EOB.  Pt was clammy and dizzy but did remain on EOB for appx 10 minutes before returning to supine.    Exercises     Shoulder Instructions      Home Living Family/patient expects to be discharged to:: Private residence Living Arrangements: Alone Available Help at Discharge: Family;Available 24 hours/day Type of Home: House Home Access: Level entry     Home Layout: One level     Bathroom Shower/Tub: Producer, television/film/videoWalk-in shower   Bathroom Toilet: Standard     Home Equipment: Environmental consultantWalker - 2 wheels   Additional Comments: Family has talked about renovating a shower and getting a higher commode.      Prior Functioning/Environment Level of Independence: Needs assistance  Gait / Transfers Assistance Needed: has taken 2 falls in last 6 months but does not use assistive device ADL's / Homemaking  Assistance Needed: has an aid 3x a week to assist more with cleaning and cooking but occasionally supervision in shower.   Comments: pt does own finances and daughter checks them        OT Problem List: Decreased strength;Decreased activity tolerance;Impaired balance (sitting and/or standing);Decreased cognition;Decreased knowledge of use of DME or AE;Decreased knowledge of precautions;Pain      OT Treatment/Interventions: Self-care/ADL training;DME and/or AE instruction;Therapeutic activities    OT Goals(Current goals can be found in the care plan section) Acute Rehab OT Goals Patient Stated Goal: to feel less pain OT Goal Formulation: With patient/family Time For Goal Achievement: 04/04/17 Potential to Achieve Goals: Good ADL Goals Pt Will Perform Grooming: with supervision;sitting Pt Will Perform Lower Body Bathing: with mod assist;sit to/from stand Pt Will Perform Lower Body Dressing: with mod assist;sit to/from stand;with adaptive equipment Pt Will Transfer to Toilet: squat pivot transfer;bedside commode;with mod assist  OT Frequency: Min 2X/week   Barriers to D/C:    family available to assist.       Co-evaluation PT/OT/SLP Co-Evaluation/Treatment: Yes Reason for Co-Treatment: For patient/therapist safety PT goals addressed during session: Mobility/safety with mobility OT goals addressed during session: ADL's and self-care      AM-PAC PT "6 Clicks" Daily Activity     Outcome Measure Help from  another person eating meals?: None Help from another person taking care of personal grooming?: A Little Help from another person toileting, which includes using toliet, bedpan, or urinal?: Total Help from another person bathing (including washing, rinsing, drying)?: Total Help from another person to put on and taking off regular upper body clothing?: A Lot Help from another person to put on and taking off regular lower body clothing?: Total 6 Click Score: 12   End of Session  Equipment Utilized During Treatment: Rolling walker;Gait belt Nurse Communication: Mobility status;Patient requests pain meds;Weight bearing status  Activity Tolerance: Patient limited by pain Patient left: in bed;with call bell/phone within reach;with family/visitor present  OT Visit Diagnosis: History of falling (Z91.81);Unsteadiness on feet (R26.81);Pain Pain - Right/Left: Left Pain - part of body: Arm;Leg                Time: 1610-9604 OT Time Calculation (min): 37 min Charges:  OT General Charges $OT Visit: 1 Visit OT Evaluation $OT Eval Moderate Complexity: 1 Mod G-Codes:     Tory Emerald, OTR/L  Hope Budds 03/21/2017, 11:26 AM

## 2017-03-21 NOTE — Plan of Care (Signed)
Problem: Education: Goal: Knowledge of [facility] Education information/materials will improve Outcome: Progressing Discussed plan of care with patient and her daughters. They are aware that their mother is likely going to have surgery to repair her pelvis.   Problem: Pain Managment: Goal: General experience of comfort will improve Outcome: Progressing Patient pain controlled with norco at this time. Have not given morphine d/t blood pressure and afib rvr  Problem: Skin Integrity: Goal: Risk for impaired skin integrity will decrease Outcome: Not Progressing Patient refusing turns.   Problem: Activity: Goal: Risk for activity intolerance will decrease Outcome: Progressing Patient on bedrest until after surgery.   Problem: Physical Regulation: Goal: Ability to maintain clinical measurements within normal limits will improve Outcome: Not Progressing Patient in Afib RVR 120-140 during shift. Increased cardizem gtt to 10 from 7.5. Monitoring closely, as patient has soft blood pressures. Cardiology consulted during day shift.

## 2017-03-21 NOTE — Progress Notes (Signed)
Messaged Cardiology: "Pts HR in 120-130s, BP 96/55 so unable to increase Cardizem drip. Reviewed note, are we switching to amio?"

## 2017-03-21 NOTE — Progress Notes (Addendum)
PROGRESS NOTE    Tracey Porter  XBJ:[id number] DOB: May 16, 1930 DOA: 03/19/2017 PCP: [place], Julie M, NP   Brief Narrative: Tracey Porter is a 83 y.o. female with medical history significant of HTN, DM2, A.fib on Eliquis, HLD, Mild dementia, Macular degeneration. She presented after a fall, tripping over a step and developed multiple pelvic fractures.   Assessment & Plan:   Principal Problem:   Multiple fractures of pelvis with unstable disruption of pelvic ring, initial encounter for closed fracture (HCC) Active Problems:   DM2 (diabetes mellitus, type 2) (HCC)   Paroxysmal SVT (supraventricular tachycardia) (HCC)   Essential hypertension   Closed pelvic fracture (HCC)   Leukocytosis   Fall at home, initial encounter   Atrial fibrillation with RVR (HCC)   Aortic atherosclerosis (HCC)   Chronic atrial fibrillation (HCC)   Hypotension due to blood loss   Coagulopathy (HCC)   Chronic diastolic heart failure (HCC)   Closed pelvic fracture Orthopedic surgery recommendations: bucks traction  Pelvic hematoma Secondary to fracture. Significant blood loss into pelvis. Patient given Kcentra on 9/9 in addition to 2 units of PRBC which appears to have resolved bleeding and improved blood counts respectively. Still trending down but may be equilibrating.  -Serial CBCs -SCD (right leg)  Anemia Secondary to blood loss. Improved with blood transfusions. Trended down slightly.  Atrial fibrillation with RVR S/p digoxin. Still with tachycardia. -diltiazem drip -cardiology recommendations  Essential hypertension Patient with hypotension initially with now normotensive blood pressure  Leukocytosis Trending up in setting of significant stress from fractures. -CBCs   Diabetes mellitus -SSI   DVT prophylaxis: SCDs Code Status: Full code Family Communication: None at bedside Disposition Plan: Discharge pending orthopedic workup   Consultants:   Orthopedic  surgery  Procedures:   Kcentra (03/20/17)  2 units PRBC (03/20/17)  Antimicrobials:  None    Subjective: Pain improved. No chest pain or dyspnea.  Objective: Vitals:   03/21/17 0600 03/21/17 0630 03/21/17 0700 03/21/17 0757  BP: (!) 126/59 125/73 125/89 (!) 141/82  Pulse: 70 (!) 107 (!) 142 (!) 106  Resp: Temp:    98.8 F (37.1 C)  TempSrc:    Oral  SpO2: 98% 97% 96% 93%  Weight:      Height:        Intake/Output Summary (Last 24 hours) at 03/21/17 0927 Last data filed at 03/21/17 0700  Gross per 24 hour  Intake          5195.01 ml  Output               50 ml  Net          5145.01 ml   Filed Weights   03/20/17 0150  Weight: 77.1 kg (170 lb)    Examination:  General exam: Appears calm and comfortable Respiratory system: Clear to auscultation. Respiratory effort normal. Cardiovascular system: S1 & S2 heard, Irregular rhythm, fast heart rate. No murmurs. Gastrointestinal system: Abdomen is nondistended, soft and mildly tender in lower quadrants. Normal bowel sounds heard. Central nervous system: Alert and oriented. No focal neurological deficits. Extremities: No edema. No calf tenderness. Left leg in traction. Skin: No cyanosis. No rashes Psychiatry: Judgement and insight appear normal. Mood & affect appropriate.     Data Reviewed: I have personally reviewed following labs and imaging studies  CBC:  Recent Labs Lab 03/19/17 2010  03/20/17 0950 03/20/17 1306 03/20/17 2258 03/21/17 0226 03/21/17 0740  WBC 13.4*  < > 15.1* 16.6* 19.2*  20.1* 22.6*  NEUTROABS 10.9*  --   --   --   --  16.2* 19.2*  HGB 12.3  < > 8.9* 8.3* 10.2* 9.9* 9.2*  HCT 37.2  < > 27.0* 25.6* 31.3* 30.0* 28.3*  MCV 92.8  < > 93.4 94.1 90.5 90.4 90.1  PLT 293  < > 256 264 213 225 221  < > = values in this interval not displayed. Basic Metabolic Panel:  Recent Labs Lab 03/19/17 2010 03/20/17 0404 03/21/17 0226  NA 137 136 138  K 3.5 4.1 4.9  CL 101 101 106  CO2 24  24 21*  GLUCOSE 135* 239* 156*  BUN 21* 21* 36*  CREATININE 0.86 0.98 2.66*  CALCIUM 9.4 8.9 8.2*  MG  --  1.5*  --   PHOS  --  4.5  --    GFR: Estimated Creatinine Clearance: 16.2 mL/min (A) (by C-G formula based on SCr of 2.66 mg/dL (H)). Liver Function Tests:  Recent Labs Lab 03/19/17 2010 03/20/17 0404  AST 23 24  ALT 24 22  ALKPHOS 74 61  BILITOT 0.8 1.0  PROT 7.1 6.6  ALBUMIN 4.0 3.5   No results for input(s): LIPASE, AMYLASE in the last 168 hours. No results for input(s): AMMONIA in the last 168 hours. Coagulation Profile:  Recent Labs Lab 03/19/17 2010  INR 1.22   Cardiac Enzymes: No results for input(s): CKTOTAL, CKMB, CKMBINDEX, TROPONINI in the last 168 hours. BNP (last 3 results) No results for input(s): PROBNP in the last 8760 hours. HbA1C: No results for input(s): HGBA1C in the last 72 hours. CBG:  Recent Labs Lab 03/20/17 1550 03/20/17 1746 03/20/17 2026 03/20/17 2326 03/21/17 0325  GLUCAP 164* 156* 170* 145* 160*   Lipid Profile: No results for input(s): CHOL, HDL, LDLCALC, TRIG, CHOLHDL, LDLDIRECT in the last 72 hours. Thyroid Function Tests:  Recent Labs  03/20/17 0404  TSH 3.410   Anemia Panel: No results for input(s): VITAMINB12, FOLATE, FERRITIN, TIBC, IRON, RETICCTPCT in the last 72 hours. Sepsis Labs: No results for input(s): PROCALCITON, LATICACIDVEN in the last 168 hours.  Recent Results (from the past 240 hour(s))  Surgical pcr screen     Status: None   Collection Time: 03/20/17  1:50 AM  Result Value Ref Range Status   MRSA, PCR NEGATIVE NEGATIVE Final   Staphylococcus aureus NEGATIVE NEGATIVE Final    Comment: (NOTE) The Xpert SA Assay (FDA approved for NASAL specimens in patients 69 years of age and older), is one component of a comprehensive surveillance program. It is not intended to diagnose infection nor to guide or monitor treatment.          Radiology Studies: Dg Chest 1 View  Result Date:  03/19/2017 CLINICAL DATA:  Pre-surgical imaging. History of diabetes and hypertension. EXAM: CHEST 1 VIEW COMPARISON:  04/20/2016 FINDINGS: Shallow inspiration with some elevation of the right hemidiaphragm. Probable atelectasis in the lung bases. No airspace disease or consolidation is suggested. Peribronchial thickening consistent with chronic bronchitis. No blunting of costophrenic angles. No pneumothorax. Calcified and tortuous aorta. Degenerative changes in the spine. IMPRESSION: Shallow inspiration with probable atelectasis in the lung bases. No evidence of active pulmonary disease. Aortic atherosclerosis. Electronically Signed   By: Burman Nieves M.D.   On: 03/19/2017 21:28   Ct Pelvis Wo Contrast  Result Date: 03/20/2017 CLINICAL DATA:  Follow up pelvic fracture. Initial encounter. EXAM: CT PELVIS WITHOUT CONTRAST TECHNIQUE: Multidetector CT imaging of the pelvis was performed following the standard protocol  without intravenous contrast. COMPARISON:  Yesterday FINDINGS: Urinary Tract: The bladder is displaced to the left and partially effaced. Bowel: Postoperative rectosigmoid junction for uncertain indication. No visible bowel injury. Vascular/Lymphatic: Bilateral pelvic hematoma, significantly increased on the right compared to prior, where there is tracking into the lower abdominal extraperitoneal space. Right pelvic sidewall hematoma measures up to 12 cm by 8 cm on axial slices. The left pelvic high-density areas noted on previous CT are likely lymph nodes given their stable density and shape. There is atherosclerotic calcification. 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 Reproductive:  Hysterectomy.  Pelvic floor laxity. Other:  No pneumoperitoneum or definite peritoneal fluid. Musculoskeletal: Left obturator ring fractures through the mid inferior pubic ramus at the puboacetabular junction. There is left sacral ala fracture extending to  the sacroiliac joint inferiorly; no displacement along the sacral foramina. Segmental fracturing of the right inferior pubic ramus and single fracture in the right superior pubic ramus. No pelvic diastasis. No proximal hip fracture or dislocation. L5 left transverse process fracture. Lower lumbar disc and facet degeneration with grade 1 L4-5 anterolisthesis. Critical Value/emergent results were called by telephone at the time of interpretation on 03/20/2017 at 4:51 pm to Dr. Jacquelin HawkingALPH Ulah Olmo , who verbally acknowledged these results. IMPRESSION: 1. Large extraperitoneal pelvic hematoma, especially on the right, significantly increased from previous scan. Right-sided hematoma measures up to 12 x 8 cm. 2. Bilateral obturator ring, left L5 transverse process, and left sacral ala fractures as described. 3. Atherosclerosis. Intimal plaque distortion at the right common and external iliac suggests remote dissection. Electronically Signed   By: Marnee SpringJonathon  Watts M.D.   On: 03/20/2017 16:56   Ct Pelvis Wo Contrast  Result Date: 03/19/2017 CLINICAL DATA:  Fall with left hip pain. Pelvic fracture known or suspected. EXAM: CT PELVIS WITHOUT CONTRAST TECHNIQUE: Multidetector CT imaging of the pelvis was performed following the standard protocol without intravenous contrast. COMPARISON:  Left hip radiographs. FINDINGS: Urinary Tract: Urinary bladder is physiologically distended, no bladder wall thickening. Bowel: Enteric sutures in the sigmoid colon. Moderate colonic stool burden in the included colon. No evidence of bowel inflammation. Vascular/Lymphatic: Vascular calcifications without aneurysm. Reproductive:  Post hysterectomy.  No adnexal mass. Other: Pelvic hemorrhage related to pelvic fractures, greater on the left. On the left this causes mild associated mass-effect on the urinary bladder. Some hyperdense material in the region of the left pelvic hemorrhage can be seen with active bleeding. Musculoskeletal: Left pelvis:  Displaced left sacral fracture, zone 1 extending into the sacroiliac joint. Fracture is lateral to the sacral foramen. Fracture extends superiorly to involve the left L5 transverse process. Superior pubic ramus fracture at the puboacetabular junction, comminuted and mildly displaced. Displaced comminuted left inferior pubic ramus fracture. Right pelvis: Comminuted superior and inferior pubic rami fractures. Fractures are minimally displaced. No definite right sacral fracture. Pubic symphysis remains congruent.  Proximal femora are intact. IMPRESSION: 1. Displaced comminuted fractures of the left superior inferior pubic ramus, superior ramus fracture at the puboacetabular junction. Displaced left sacral fracture extending into the sacroiliac joint. Left L5 transverse process fracture. 2. Displaced right superior and inferior pubic rami fractures. No definite right sacral fracture. 3. Pelvic hemorrhage related to pelvic fractures, greater on the left. Questionable active bleeding within the left pelvic hemorrhage. These results were called by telephone at the time of interpretation on 03/19/2017 at 9:57 pm to PA [facility] UP[facility]ILL , who verbally acknowledged these results. Electronically Signed  By: Rubye Oaks M.D.   On: 03/19/2017 21:58   Dg Hip Unilat With Pelvis 2-3 Views Left  Result Date: 03/19/2017 CLINICAL DATA:  Larey Seat down the, left hip pain EXAM: DG HIP (WITH OR WITHOUT PELVIS) 2-3V LEFT COMPARISON:  None. FINDINGS: Pubic symphysis is intact. There are displaced right superior and inferior pubic rami fractures. Possible vertically-oriented fracture through the right aspect of the sacrum. Superiorly displaced left inferior and superior pubic rami fractures with additional fracture suspected through the left sacrum. There is probably slight widening of the left SI joint. Both femoral heads project in joint. Vascular calcifications. Postsurgical suture in the pelvis IMPRESSION: 1. Superiorly displaced left  superior and inferior pubic rami with probable vertically-oriented fracture through the left sacrum and suspected mild widening of left SI joint, suspect for vertical shear injury. 2. Displaced fractures involving the right superior and inferior pubic rami with questionable fracture lucency in the right aspect of sacrum. Critical Value/emergent results were called by telephone at the time of interpretation on 03/19/2017 at 9:00 pm to Dr. Marily Memos , who verbally acknowledged these results. Electronically Signed   By: Jasmine Pang M.D.   On: 03/19/2017 21:00        Scheduled Meds: . acyclovir  800 mg Oral BID  . atorvastatin  40 mg Oral Daily  . insulin aspart  0-9 Units Subcutaneous Q4H  . mirabegron ER  25 mg Oral Daily  . prednisoLONE acetate  1 drop Right Eye BID  . senna  1 tablet Oral BID   Continuous Infusions: . sodium chloride 100 mL/hr at 03/20/17 2107  . sodium chloride    . diltiazem (CARDIZEM) infusion 12.5 mg/hr (03/21/17 0547)     LOS: 1 day     Jacquelin Hawking, MD [facility] 03/21/2017, 9:27 AM Pager: [phone]  If 7PM-7AM, please contact night-coverage [email or web address] Password [email or web address] 03/21/2017, 9:27 AM

## 2017-03-21 NOTE — Progress Notes (Signed)
PULMONARY / CRITICAL CARE MEDICINE   Name: Tracey Porter MRN: [id number] DOB: 09/17/1929    ADMISSION DATE:  03/19/2017 CONSULTATION DATE:  03/20/2017   REFERRING MD:  Caleb PoppNettey CHIEF COMPLAINT:  Hypotension   HISTORY OF PRESENT ILLNESS:   83 y.o. femalewith medical history significant of HTN, DM2, A.fib on Eliquis, HLD, Mild dementia, Macular degeneration. She presented after a fall, tripping over a step and developed multiple pelvic fractures. Patient is in afib with RVR and is developing a worsening extraperitoneal  Hematoma received kcentra and is receiving 2 u PRBCs.   I came to assess the patient due to hypotensive episodes earlier the day which has improved with blood transfusion. Patient is asymptomatic, no shortness of breath, no chest pain, no dizziness. Patient BP at the time of my assessment 117/57 HR112 irregular. No signs of active bleeding.    SUBJECTIVE: pain related to fracture. Manageable if still.    VITAL SIGNS: BP (!) 129/56   Pulse 84   Temp 98.8 F (37.1 C) (Oral)   Resp 15   Ht 5\' 6"  (1.676 m)   Wt 77.1 kg (170 lb) Comment: per NT  SpO2 96%   BMI 27.44 kg/m   INTAKE / OUTPUT: I/O last 3 completed shifts: In: 5811 [I.V.:2711.3; Blood:650; Other:1950; IV Piggyback:499.8] Out: 250 [Urine:250]  PHYSICAL EXAMINATION:   General: alert oriented not in any distress Neuro:  Awake alert moving all extremities, cranial nerves grossly intact HEENT:  [employer]/AT, PERRL, no JVD Cardiovascular:  IRIR tachy rates 110s.  Lungs:  Clear equal air sounds bilaterally no wheezing  Abdomen: tenderness at the lower quadrants, hyperactive BS Musculoskeletal:  No edema Skin:  No rash   LABS:  BMET  Recent Labs Lab 03/19/17 2010 03/20/17 0404 03/21/17 0226  NA 137 136 138  K 3.5 4.1 4.9  CL 101 101 106  CO2 24 24 21*  BUN 21* 21* 36*  CREATININE 0.86 0.98 2.66*  GLUCOSE 135* 239* 156*    Electrolytes  Recent Labs Lab 03/19/17 2010 03/20/17 0404 03/21/17 0226   CALCIUM 9.4 8.9 8.2*  MG  --  1.5*  --   PHOS  --  4.5  --     CBC  Recent Labs Lab 03/21/17 0226 03/21/17 0740 03/21/17 1346  WBC 20.1* 22.6* 24.0*  HGB 9.9* 9.2* 8.5*  HCT 30.0* 28.3* 25.2*  PLT 225 221 207    Coag's  Recent Labs Lab 03/19/17 2010  INR 1.22    Sepsis Markers No results for input(s): LATICACIDVEN, PROCALCITON, O2SATVEN in the last 168 hours.  ABG No results for input(s): PHART, PCO2ART, PO2ART in the last 168 hours.  Liver Enzymes  Recent Labs Lab 03/19/17 2010 03/20/17 0404  AST 23 24  ALT 24 22  ALKPHOS 74 61  BILITOT 0.8 1.0  ALBUMIN 4.0 3.5    Cardiac Enzymes No results for input(s): TROPONINI, PROBNP in the last 168 hours.  Glucose  Recent Labs Lab 03/20/17 1746 03/20/17 2026 03/20/17 2326 03/21/17 0325 03/21/17 0802 03/21/17 1226  GLUCAP 156* 170* 145* 160* 151* 156*    Imaging Ct Pelvis Wo Contrast  Result Date: 03/20/2017 CLINICAL DATA:  Follow up pelvic fracture. Initial encounter. EXAM: CT PELVIS WITHOUT CONTRAST TECHNIQUE: Multidetector CT imaging of the pelvis was performed following the standard protocol without intravenous contrast. COMPARISON:  Yesterday FINDINGS: Urinary Tract: The bladder is displaced to the left and partially effaced. Bowel: Postoperative rectosigmoid junction for uncertain indication. No visible bowel injury. Vascular/Lymphatic: Bilateral pelvic hematoma, significantly  increased on the right compared to prior, where there is tracking into the lower abdominal extraperitoneal space. Right pelvic sidewall hematoma measures up to 12 cm by 8 cm on axial slices. The left pelvic high-density areas noted on previous CT are likely lymph nodes given their stable density and shape. There is atherosclerotic calcification. 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 Reproductive:  Hysterectomy.  Pelvic floor laxity. Other:  No pneumoperitoneum or definite  peritoneal fluid. Musculoskeletal: Left obturator ring fractures through the mid inferior pubic ramus at the puboacetabular junction. There is left sacral ala fracture extending to the sacroiliac joint inferiorly; no displacement along the sacral foramina. Segmental fracturing of the right inferior pubic ramus and single fracture in the right superior pubic ramus. No pelvic diastasis. No proximal hip fracture or dislocation. L5 left transverse process fracture. Lower lumbar disc and facet degeneration with grade 1 L4-5 anterolisthesis. Critical Value/emergent results were called by telephone at the time of interpretation on 03/20/2017 at 4:51 pm to Dr. Jacquelin Hawking , who verbally acknowledged these results. IMPRESSION: 1. Large extraperitoneal pelvic hematoma, especially on the right, significantly increased from previous scan. Right-sided hematoma measures up to 12 x 8 cm. 2. Bilateral obturator ring, left L5 transverse process, and left sacral ala fractures as described. 3. Atherosclerosis. Intimal plaque distortion at the right common and external iliac suggests remote dissection. Electronically Signed   By: Marnee Spring M.D.   On: 03/20/2017 16:56   Dg Pelvis Comp Min 3v  Result Date: 03/21/2017 CLINICAL DATA:  Fall, pelvic fracture EXAM: JUDET PELVIS - 3+ VIEW COMPARISON:  CT pelvis dated 03/20/2017 FINDINGS: Bilateral pelvic ring fractures, better evaluated on CT. Known left sacral fracture is not well visualized due to overlying bowel gas. IMPRESSION: Bilateral pelvic ring fractures, better evaluated on CT. Known left sacral fracture is not well visualized due to overlying bowel gas. Electronically Signed   By: Charline Bills M.D.   On: 03/21/2017 09:49    STUDIES:  CT abdomen 9/9  1. Large extraperitoneal pelvic hematoma, especially on the right, significantly increased from previous scan. Right-sided hematoma measures up to 12 x 8 cm. 2. Bilateral obturator ring, left L5 transverse process,  and left sacral ala fractures as described. 3. Atherosclerosis. Intimal plaque distortion at the right common and external iliac suggests remote dissection.   ASSESSMENT / PLAN:  Hypotension secondary to blood loss - improved with blood products Acute anemia of blood loss Extraperitoneal hemorrhage on oral anticoagulation reversed with KCentra - vitals per unit protocol - follow CBC - continue holding xeralto (received [place]).  Pelvic fracture - per ortho  Atrial fibrillation with rapid ventricular response - cardiology following, amiodarone started today, rate better controlled  Daughter updated with patient bedside   PCCM will sign off.   Joneen Roach, AGACNP-BC [facility] Pulmonology/Critical Care Pager [phone] or [phone]  03/21/2017 3:49 PM

## 2017-03-22 ENCOUNTER — Inpatient Hospital Stay (HOSPITAL_COMMUNITY): Payer: Medicare Other

## 2017-03-22 ENCOUNTER — Telehealth: Payer: Self-pay | Admitting: Cardiovascular Disease

## 2017-03-22 DIAGNOSIS — R0689 Other abnormalities of breathing: Secondary | ICD-10-CM

## 2017-03-22 DIAGNOSIS — I700 Atherosclerosis of aorta: Secondary | ICD-10-CM

## 2017-03-22 DIAGNOSIS — I471 Supraventricular tachycardia: Secondary | ICD-10-CM

## 2017-03-22 LAB — BASIC METABOLIC PANEL
Anion gap: 7 (ref 5–15)
BUN: 25 mg/dL — AB (ref 6–20)
CHLORIDE: 105 mmol/L (ref 101–111)
CO2: 23 mmol/L (ref 22–32)
Calcium: 7.9 mg/dL — ABNORMAL LOW (ref 8.9–10.3)
Creatinine, Ser: 1.07 mg/dL — ABNORMAL HIGH (ref 0.44–1.00)
GFR calc Af Amer: 55 mL/min — ABNORMAL LOW (ref >60)
GFR calc non Af Amer: 47 mL/min — ABNORMAL LOW (ref >60)
GLUCOSE: 158 mg/dL — AB (ref 65–99)
POTASSIUM: 4.1 mmol/L (ref 3.5–5.1)
SODIUM: 135 mmol/L (ref 135–145)

## 2017-03-22 LAB — CBC WITH DIFFERENTIAL/PLATELET
BASOS ABS: 0 10*3/uL (ref 0.0–0.1)
Basophils Absolute: 0 10*3/uL (ref 0.0–0.1)
Basophils Absolute: 0 10*3/uL (ref 0.0–0.1)
Basophils Relative: 0 %
Basophils Relative: 0 %
Basophils Relative: 0 %
EOS ABS: 0 10*3/uL (ref 0.0–0.7)
EOS PCT: 0 %
Eosinophils Absolute: 0 10*3/uL (ref 0.0–0.7)
Eosinophils Absolute: 0 10*3/uL (ref 0.0–0.7)
Eosinophils Relative: 0 %
Eosinophils Relative: 0 %
HCT: 25.6 % — ABNORMAL LOW (ref 36.0–46.0)
HEMATOCRIT: 24.7 % — AB (ref 36.0–46.0)
HEMATOCRIT: 24.9 % — AB (ref 36.0–46.0)
HEMOGLOBIN: 8.5 g/dL — AB (ref 12.0–15.0)
HEMOGLOBIN: 8.4 g/dL — AB (ref 12.0–15.0)
Hemoglobin: 8.2 g/dL — ABNORMAL LOW (ref 12.0–15.0)
LYMPHS ABS: 1 10*3/uL (ref 0.7–4.0)
LYMPHS ABS: 0.7 10*3/uL (ref 0.7–4.0)
LYMPHS ABS: 1.3 10*3/uL (ref 0.7–4.0)
LYMPHS PCT: 5 %
LYMPHS PCT: 4 %
LYMPHS PCT: 6 %
MCH: 30 pg (ref 26.0–34.0)
MCH: 29.9 pg (ref 26.0–34.0)
MCH: 30.5 pg (ref 26.0–34.0)
MCHC: 33.2 g/dL (ref 30.0–36.0)
MCHC: 33.2 g/dL (ref 30.0–36.0)
MCHC: 33.7 g/dL (ref 30.0–36.0)
MCV: 90.5 fL (ref 78.0–100.0)
MCV: 90.1 fL (ref 78.0–100.0)
MCV: 90.5 fL (ref 78.0–100.0)
MONO ABS: 2 10*3/uL — AB (ref 0.1–1.0)
MONOS PCT: 9 %
Monocytes Absolute: 1.9 10*3/uL — ABNORMAL HIGH (ref 0.1–1.0)
Monocytes Absolute: 1.8 10*3/uL — ABNORMAL HIGH (ref 0.1–1.0)
Monocytes Relative: 10 %
Monocytes Relative: 9 %
NEUTROS ABS: 15.7 10*3/uL — AB (ref 1.7–7.7)
NEUTROS ABS: 17.5 10*3/uL — AB (ref 1.7–7.7)
NEUTROS PCT: 87 %
NEUTROS PCT: 85 %
Neutro Abs: 17.1 10*3/uL — ABNORMAL HIGH (ref 1.7–7.7)
Neutrophils Relative %: 85 %
Platelets: 212 10*3/uL (ref 150–400)
Platelets: 222 10*3/uL (ref 150–400)
Platelets: 230 10*3/uL (ref 150–400)
RBC: 2.73 MIL/uL — AB (ref 3.87–5.11)
RBC: 2.84 MIL/uL — AB (ref 3.87–5.11)
RBC: 2.75 MIL/uL — ABNORMAL LOW (ref 3.87–5.11)
RDW: 15.4 % (ref 11.5–15.5)
RDW: 15.5 % (ref 11.5–15.5)
RDW: 15.7 % — AB (ref 11.5–15.5)
WBC: 18.6 10*3/uL — AB (ref 4.0–10.5)
WBC: 19.6 10*3/uL — AB (ref 4.0–10.5)
WBC: 20.7 10*3/uL — ABNORMAL HIGH (ref 4.0–10.5)

## 2017-03-22 LAB — GLUCOSE, CAPILLARY
GLUCOSE-CAPILLARY: 141 mg/dL — AB (ref 65–99)
GLUCOSE-CAPILLARY: 154 mg/dL — AB (ref 65–99)
GLUCOSE-CAPILLARY: 157 mg/dL — AB (ref 65–99)
Glucose-Capillary: 142 mg/dL — ABNORMAL HIGH (ref 65–99)
Glucose-Capillary: 143 mg/dL — ABNORMAL HIGH (ref 65–99)
Glucose-Capillary: 153 mg/dL — ABNORMAL HIGH (ref 65–99)
Glucose-Capillary: 159 mg/dL — ABNORMAL HIGH (ref 65–99)

## 2017-03-22 IMAGING — DX DG ABD PORTABLE 1V
2 series · 2 of 2 positions shown · non-contrast
Comparison: AP pelvis radiographs and pelvic CT scan Monday March, 2017

CLINICAL DATA: Clinical constipation

EXAM:
PORTABLE ABDOMEN - 1 VIEW

[abdomen kub (1 of 2)]
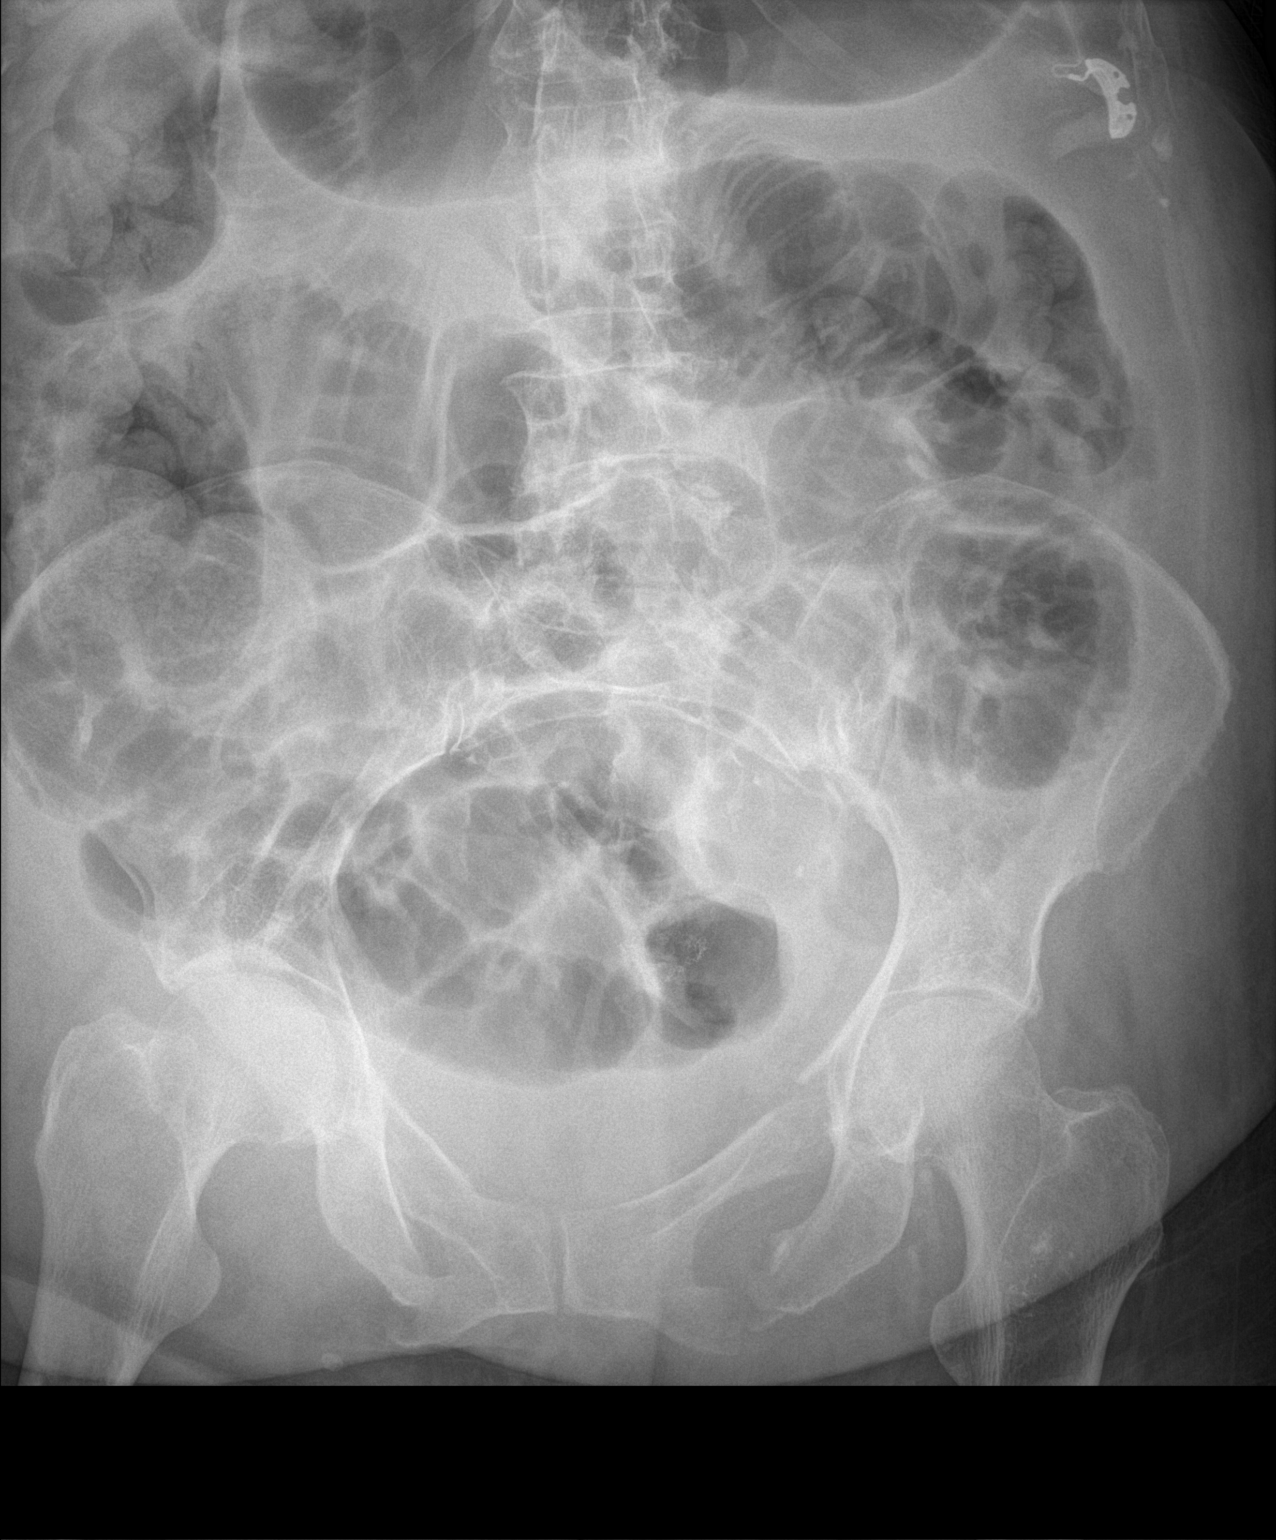

[abdomen kub (2 of 2)]
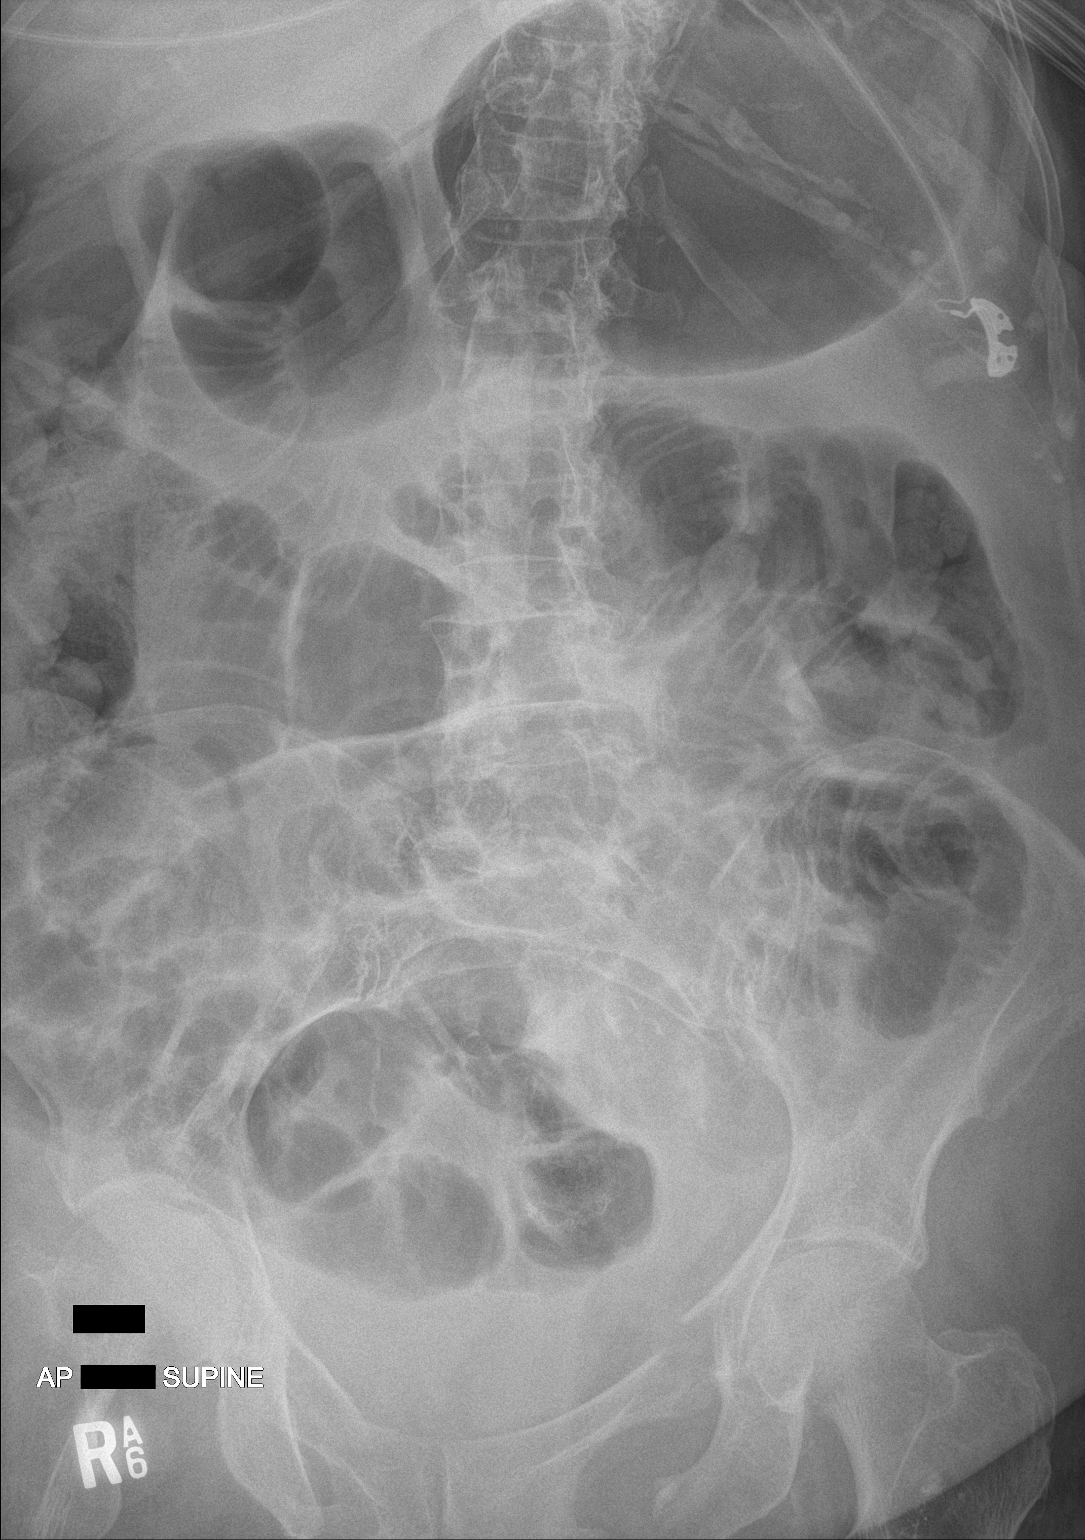

[2 of 2 positions shown; findings below may reference images not displayed]

FINDINGS: There are loops of moderately distended gas-filled small and large
bowel present there is a moderate amount of stool in the right colon
and some within the descending colon. There is no definite rectal
gas. No free extraluminal gas collections are observed. Gas in the
left upper quadrant likely lies within the stomach but distention of
the splenic flexure region could be present. Bilateral pelvic ring
displaced fractures are again demonstrated.
IMPRESSION: Gaseous distention of small and large bowel loops may reflect ileus
or possible distal colonic obstructive process though I favor the
former. No definite perforation. Abdominal and pelvic CT scanning
would be a useful next imaging step.

## 2017-03-22 IMAGING — CT CT ABD-PELV W/O CM
2 of 4 series · 15 of 46 positions shown, 17 images · non-contrast
Comparison: Pelvic CT 03/20/2017

CLINICAL DATA: Abdominal distention.  Pelvic fractures after fall.

EXAM:
CT ABDOMEN AND PELVIS WITHOUT CONTRAST
TECHNIQUE: Multidetector CT imaging of the abdomen and pelvis was performed
following the standard protocol without IV contrast.

[Series 3: a/p w/o 5mm · axial · non-contrast · 0.97mm/px · z∈[+768,+1218]mm · 12 of 104 slices shown, 14 images]
[im 9/104  soft-tissue]
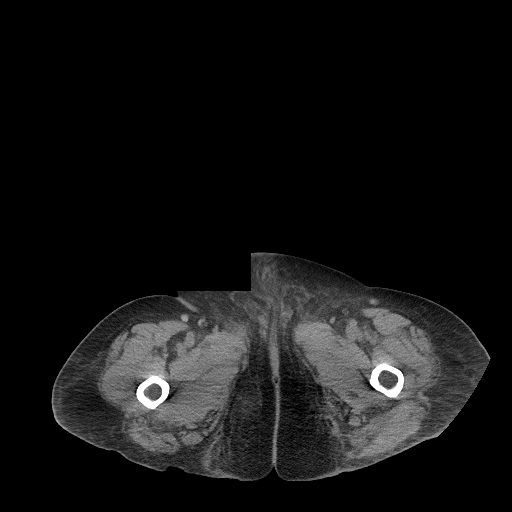
[im 9/104  bone]
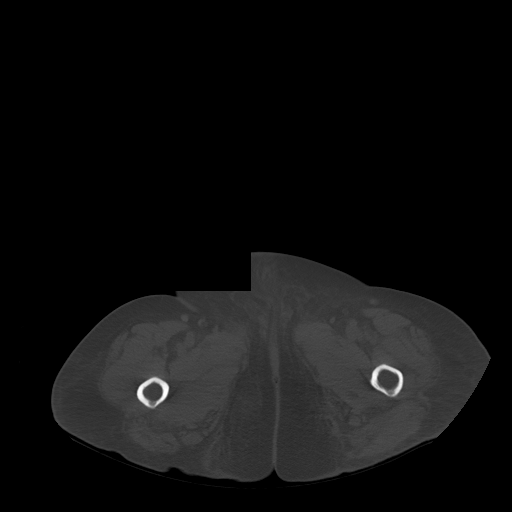
[im 17/104  soft-tissue]
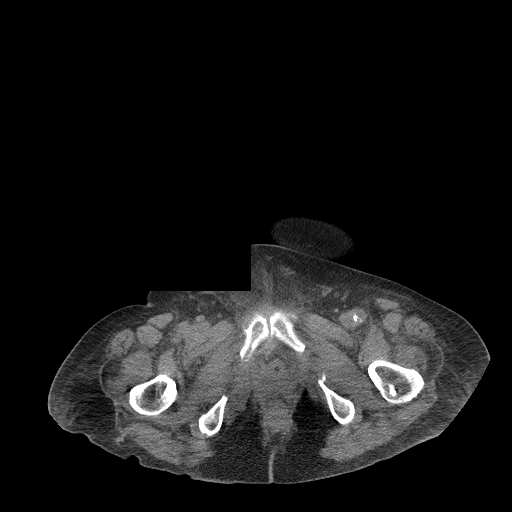
[im 25/104  soft-tissue]
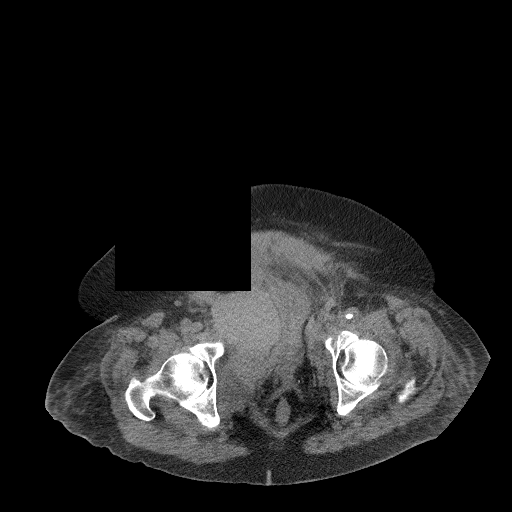
[im 33/104  soft-tissue]
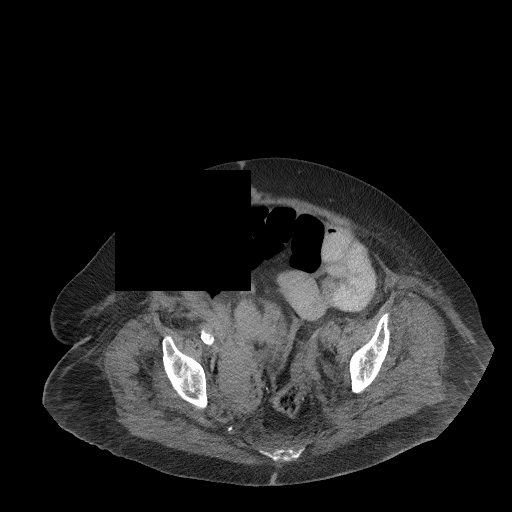
[im 42/104  soft-tissue]
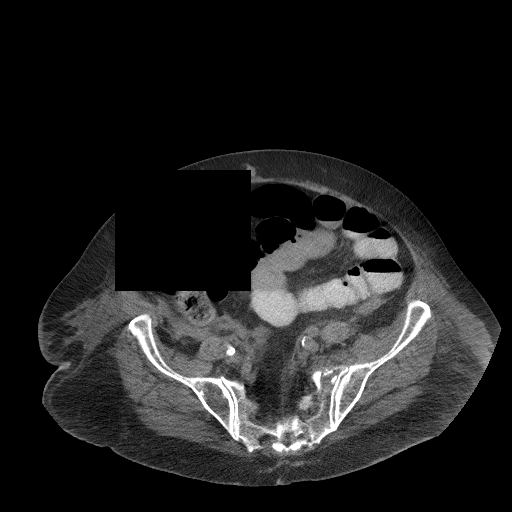
[im 50/104  soft-tissue]
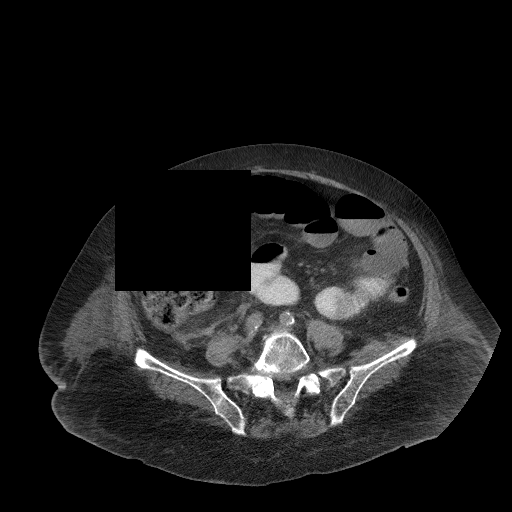
[im 58/104  soft-tissue]
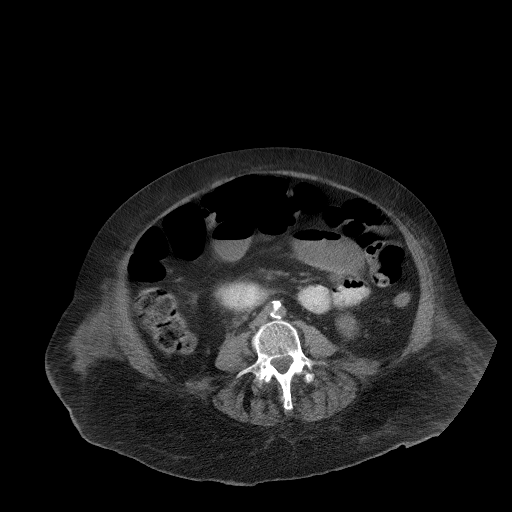
[im 66/104  soft-tissue]
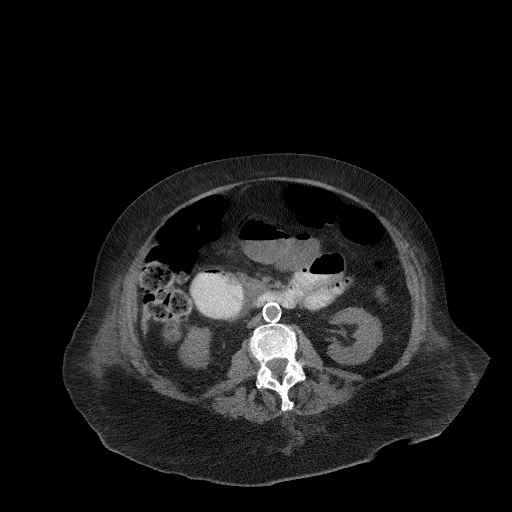
[im 75/104  soft-tissue]
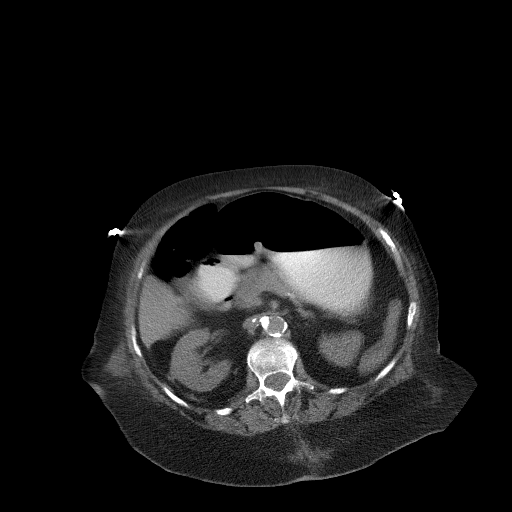
[im 75/104  bone]
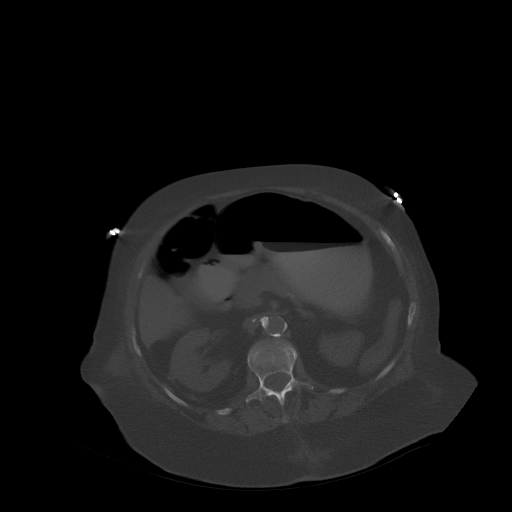
[im 83/104  soft-tissue]
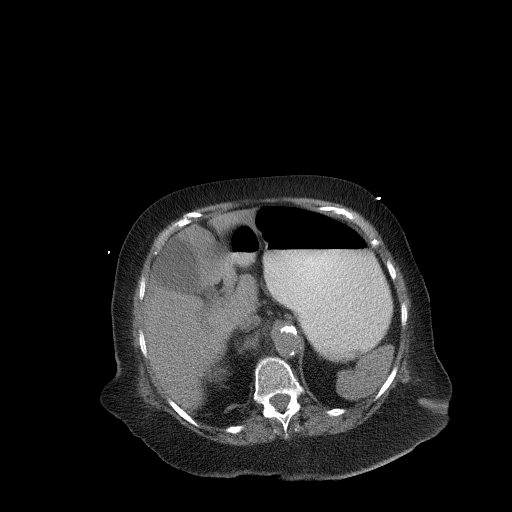
[im 91/104  soft-tissue]
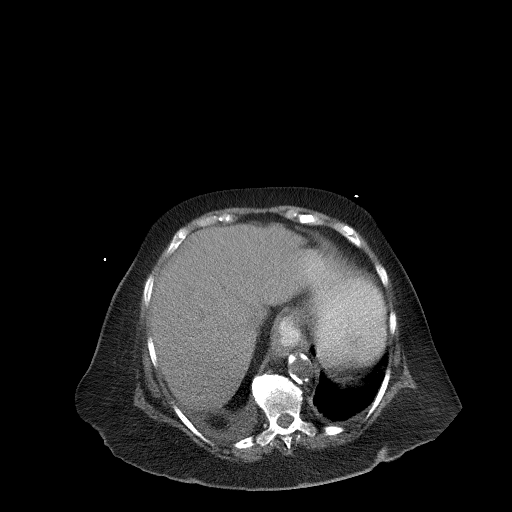
[im 99/104  soft-tissue]
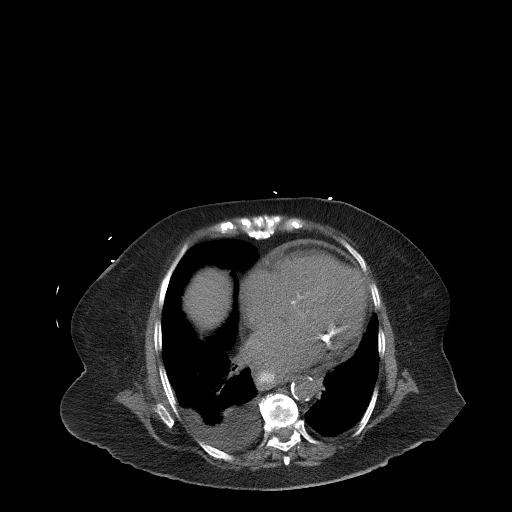

[Series 6: a/p w/o cor · coronal · non-contrast · 0.96mm/px · 3 of 184 slices shown]
[im 62/184  soft-tissue]
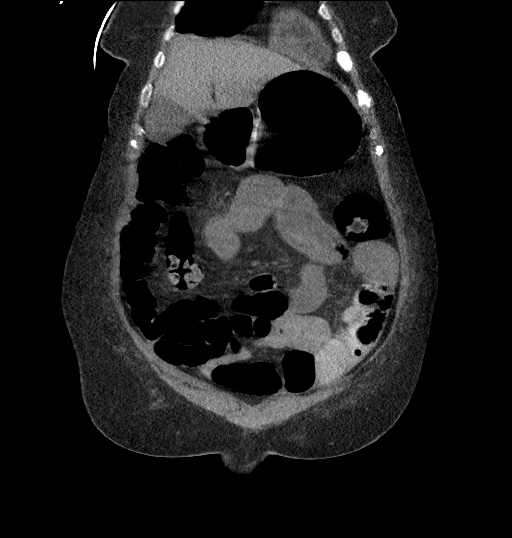
[im 82/184  soft-tissue]
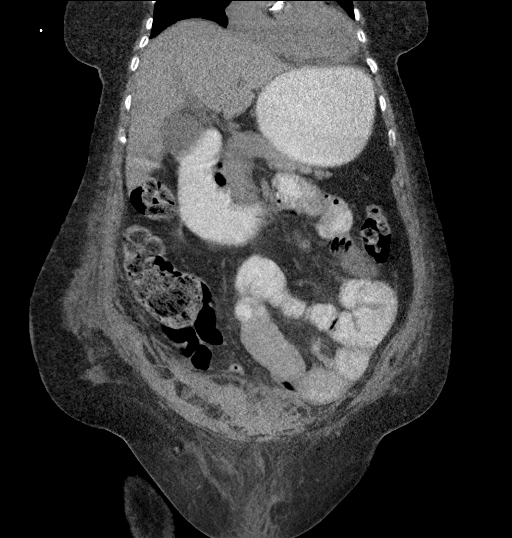
[im 102/184  soft-tissue]
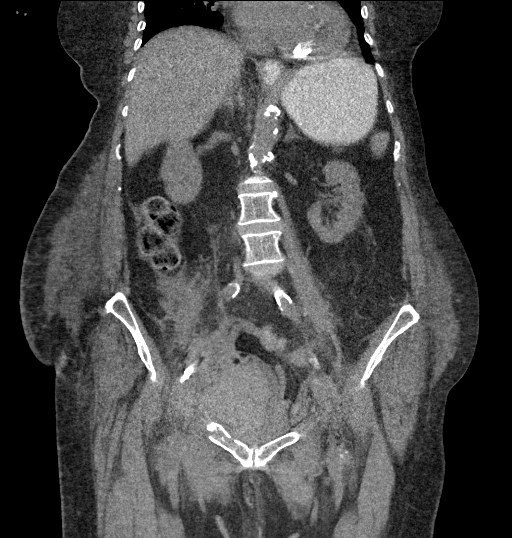

[15 of 46 positions shown; findings below may reference images not displayed]

FINDINGS: Lower chest: Cardiomegaly with coronary arteriosclerosis and aortic
atherosclerosis. Small right pleural effusion with adjacent
atelectasis. Trace left pleural effusion. Reflux of contrast is
noted within the distal esophagus associated with a small hiatal
hernia.

Hepatobiliary: No space-occupying mass of the liver. No liver
laceration is apparent. No biliary dilatation. Gallbladder is
distended without wall thickening or calculus possibly due to a
fasting state.

Pancreas: Normal appearance of the pancreas without inflammation or
ductal dilatation. No space-occupying mass.

Spleen: Normal spleen without mass. No evidence of splenic
laceration or subcapsular fluid.

Adrenals/Urinary Tract: Normal bilateral adrenal glands and kidneys.
No obstructive uropathy. The urinary bladder is decompressed by
Foley catheter and slightly deviated to the left secondary to the
known extraperitoneal hematoma on the right.

Stomach/Bowel: Moderate-to-marked gastric distention with enteric
contrast. There is normal small bowel rotation. Contrast and
fluid-filled distention of small bowel is identified with moderate
colonic stool burden noted. No mechanical source obstruction is
apparent. Findings may reflect an an ileus or possibly small bowel
dysmotility.

Vascular/Lymphatic: Slightly smaller right extraperitoneal hematoma
now estimated at 9.9 x 7.2 cm on axial images acquired at the same
level as prior. Previously this measured 12 x 8 cm. Moderate
aortoiliac and branch vessel atherosclerosis.

Reproductive: Status post hysterectomy.  Pelvic floor laxity.

Other: No pneumoperitoneum or intraperitoneal fluid.

Musculoskeletal: Fractures of the left obturator ring in the mid
inferior pubic ramus at the puboacetabular junction, left sacral ale
are fracture extending to the sacroiliac joint inferiorly, segmental
fracturing of the right inferior pubic ramus and single fracture of
the right superior pubic ramus are again noted without evidence of
pelvic diastasis or proximal hip fractures. Left L5 transverse
process fractures noted. There is lower lumbar degenerative disc and
facet arthropathy with grade 1 anterolisthesis of L4 on L5.
IMPRESSION: 1. Contrast and fluid distention of small bowel loops without
mechanical source of obstruction identified. Findings likely
represent an ileus or small bowel dysmotility.
2. Moderate-to-marked retention of enteric contrast noted within the
stomach with small hiatal hernia. This likely contributes to the
contrast seen in the distal esophagus from reflux.
3. Chronic acute pelvic fractures as above.
4. Slightly smaller right pelvic sidewall extraperitoneal hematoma.
Currently this measures 9.9 x 7.2 cm versus 12 x 8 cm on axial
images.
5. Small right pleural effusion.
6. Cardiomegaly with coronary arteriosclerosis.

## 2017-03-22 IMAGING — DX DG CHEST 1V PORT
1 series · 1 of 1 positions shown · non-contrast
Comparison: Portable chest x-ray March 19, 2017

CLINICAL DATA: Decreased breath sounds. History of diabetes, CHF,
atrial fibrillation, hypertension, recent fall with multiple pelvic
fractures

EXAM:
PORTABLE CHEST 1 VIEW

[chest ap]
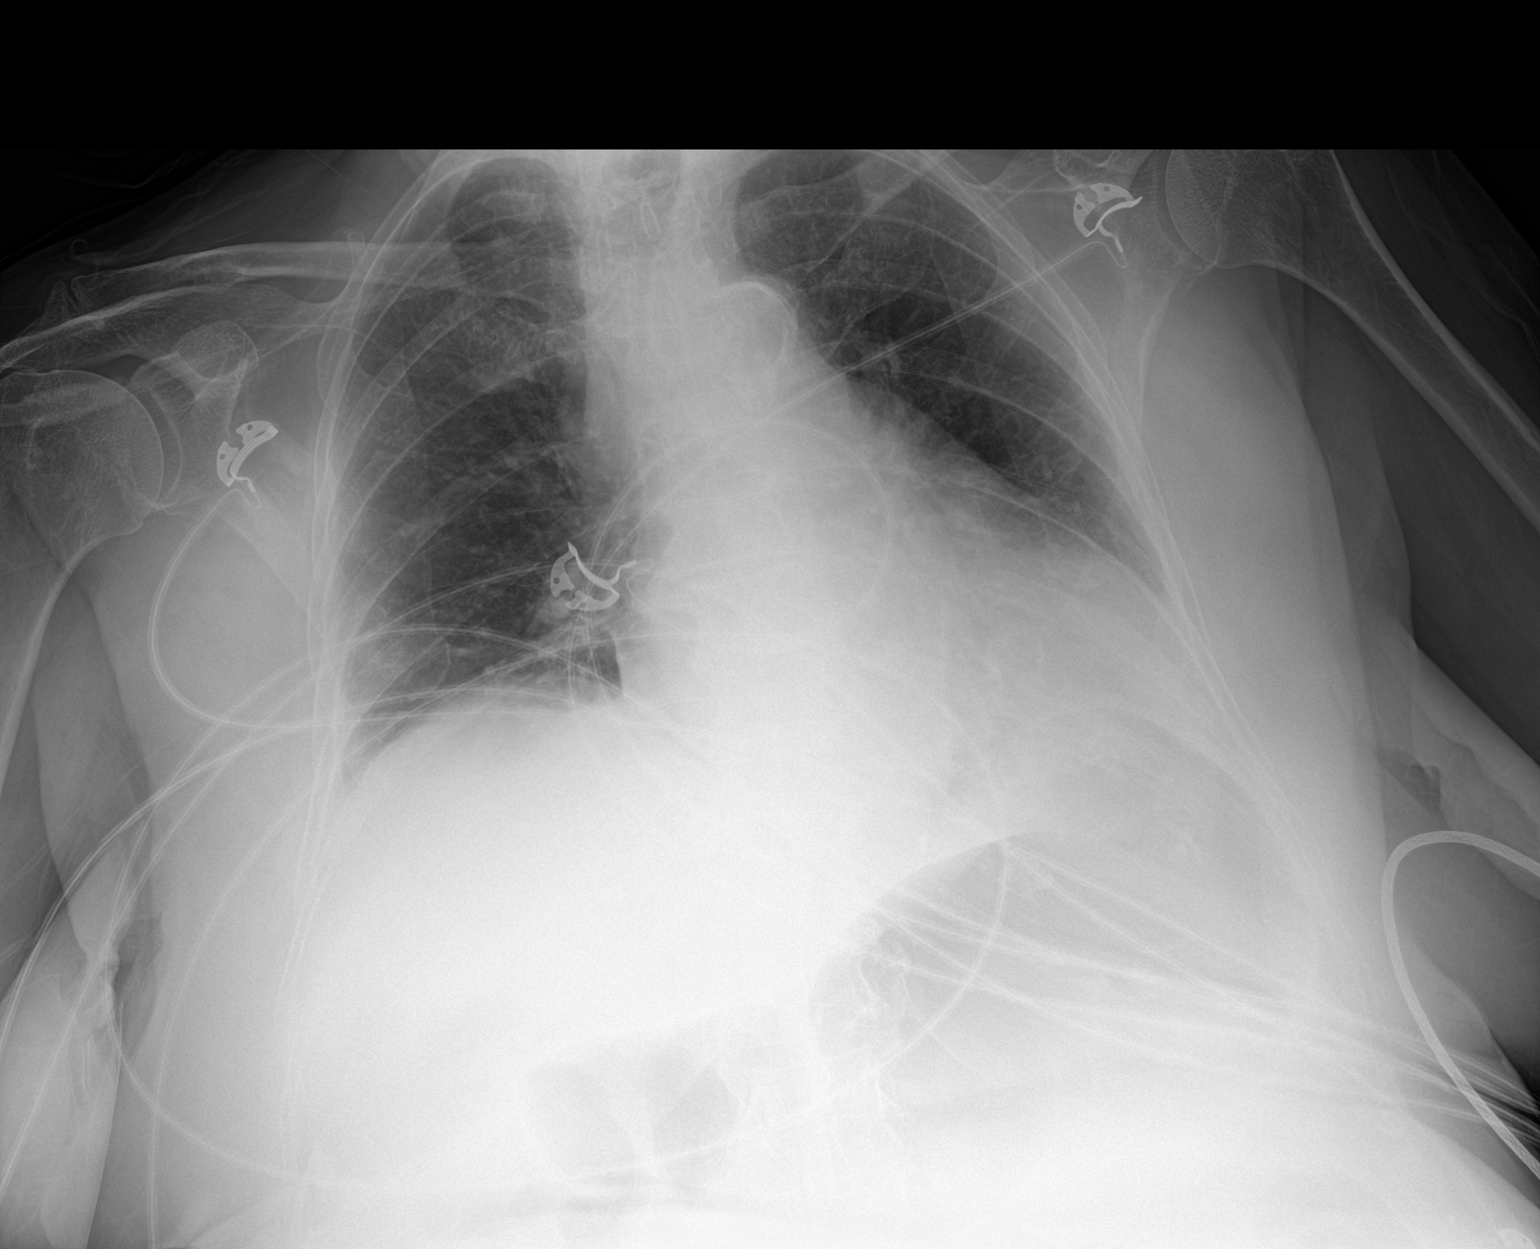

[1 of 1 positions shown; findings below may reference images not displayed]

FINDINGS: The right lung remains mildly hypoinflated. The left lung is better
inflated. The cardiac silhouette is enlarged. The pulmonary
vascularity is normal. There is no definite pleural effusion. There
is calcification in the wall of the aortic arch.
IMPRESSION: Persistent mild hypoinflation on the right. Stable cardiomegaly. No
definite pneumonia nor CHF.

## 2017-03-22 MED ORDER — DILTIAZEM HCL 25 MG/5ML IV SOLN: 10.0000 mg | Freq: Once | INTRAVENOUS | Status: DC

## 2017-03-22 MED ORDER — LORAZEPAM 0.5 MG PO TABS
0.5000 mg | ORAL_TABLET | Freq: Four times a day (QID) | ORAL | Status: DC | PRN
Start: 2017-03-22 — End: 2017-03-26
  Administered 2017-03-22 – 2017-03-24 (×4): 0.5 mg via ORAL
  Filled 2017-03-22 (×5): qty 1

## 2017-03-22 MED ORDER — MORPHINE SULFATE (PF) 4 MG/ML IV SOLN
1.0000 mg | INTRAVENOUS | Status: DC | PRN
  Administered 2017-03-23 (×4): 1 mg via INTRAVENOUS
  Filled 2017-03-22 (×5): qty 1

## 2017-03-22 MED ORDER — IOPAMIDOL (ISOVUE-300) INJECTION 61%
INTRAVENOUS | Status: AC
  Filled 2017-03-22: qty 100

## 2017-03-22 MED ORDER — ALBUTEROL SULFATE (2.5 MG/3ML) 0.083% IN NEBU
INHALATION_SOLUTION | RESPIRATORY_TRACT | Status: AC
Start: 2017-03-22 — End: 2017-03-22
  Administered 2017-03-22: 2.5 mg
  Filled 2017-03-22: qty 3

## 2017-03-22 MED ORDER — AMIODARONE IV BOLUS ONLY 150 MG/100ML
150.0000 mg | Freq: Once | INTRAVENOUS | Status: AC
  Administered 2017-03-22: 150 mg via INTRAVENOUS
  Filled 2017-03-22: qty 100

## 2017-03-22 MED ORDER — ACETAMINOPHEN 10 MG/ML IV SOLN
1000.0000 mg | Freq: Four times a day (QID) | INTRAVENOUS | Status: AC
  Administered 2017-03-22 – 2017-03-23 (×4): 1000 mg via INTRAVENOUS
  Filled 2017-03-22 (×5): qty 100

## 2017-03-22 MED ORDER — DILTIAZEM LOAD VIA INFUSION
10.0000 mg | Freq: Once | INTRAVENOUS | Status: AC
  Administered 2017-03-22: 10 mg via INTRAVENOUS
  Filled 2017-03-22: qty 10

## 2017-03-22 MED ORDER — ALBUTEROL SULFATE (2.5 MG/3ML) 0.083% IN NEBU
2.5000 mg | INHALATION_SOLUTION | Freq: Four times a day (QID) | RESPIRATORY_TRACT | Status: DC | PRN
  Administered 2017-03-23 – 2017-03-24 (×3): 2.5 mg via RESPIRATORY_TRACT
  Filled 2017-03-22 (×3): qty 3

## 2017-03-22 MED ORDER — DILTIAZEM HCL 100 MG IV SOLR
5.0000 mg/h | INTRAVENOUS | Status: DC
  Administered 2017-03-22: 7.5 mg/h via INTRAVENOUS
  Administered 2017-03-22: 5 mg/h via INTRAVENOUS
  Administered 2017-03-23 (×3): 10 mg/h via INTRAVENOUS
  Administered 2017-03-24 – 2017-03-28 (×15): 15 mg/h via INTRAVENOUS
  Filled 2017-03-22 (×20): qty 100

## 2017-03-22 MED ORDER — IOPAMIDOL (ISOVUE-300) INJECTION 61%
INTRAVENOUS | Status: AC
Start: 2017-03-22 — End: 2017-03-22
  Administered 2017-03-22: 30 mL
  Filled 2017-03-22: qty 30

## 2017-03-22 NOTE — Clinical Social Work Note (Signed)
Clinical Social Work Assessment  Patient Details  Name: Tracey Porter MRN: [id number] Date of Birth: 06/04/1930  Date of referral:  03/22/17               Reason for consult:  Facility Placement                Permission sought to share information with:  Facility Industrial/product designerContact Representative Permission granted to share information::  Yes, Verbal Permission Granted  Name::     Salley HewsSusan, Jennifer  Agency::  SNF  Relationship::  dtrs  Contact Information:     Housing/Transportation Living arrangements for the past 2 months:  Single Family Home Source of Information:  Adult Children, Patient Patient Interpreter Needed:  None Criminal Activity/Legal Involvement Pertinent to Current Situation/Hospitalization:  No - Comment as needed Significant Relationships:  Adult Children Lives with:  Self Do you feel safe going back to the place where you live?  No Need for family participation in patient care:  No (Coment)  Care giving concerns: pt lives at home alone- normally independent with ADLs- per family would not be enough support for patient to return home safely at this time.   Social Worker assessment / plan: CSW spoke with patient and pt dtr concerning PT recommendation for SNF.  Pt feeling poorly and does not want to speak at this time- asks that CSW speak with her dtr Darl PikesSusan who is at bedside  CSW discussed SNF and SNF referral process and provided with list of options.  Dtr states pt has never been to SNF but expressing understanding of what it would entail.  Employment status:  Retired Database administratornsurance information:  Managed Medicare PT Recommendations:  Skilled Nursing Facility Information / Referral to community resources:  Skilled Nursing Facility  Patient/Family's Response to care:  Agreeable to SNF at this time- very realistic about patients current functioning and inability to be safe at home.  Patient/Family's Understanding of and Emotional Response to Diagnosis, Current Treatment, and  Prognosis:  Patient dtr seems to have good understanding of patient condition and needs at this time- hopeful patient will improve quickly with SNF stay and can return home soon.  Emotional Assessment Appearance:  Appears stated age Attitude/Demeanor/Rapport:    Affect (typically observed):  Quiet Orientation:  Oriented to Self, Oriented to Place, Oriented to  Time, Oriented to Situation Alcohol / Substance use:  Not Applicable Psych involvement (Current and /or in the community):  No (Comment)  Discharge Needs  Concerns to be addressed:  Care Coordination Readmission within the last 30 days:  No Current discharge risk:  Physical Impairment Barriers to Discharge:  Continued Medical Work up   Burna SisUris, Treg Diemer H, LCSW 03/22/2017, 1:59 PM

## 2017-03-22 NOTE — Progress Notes (Signed)
MD, RN, pt and pts family under the impression PT would be working w/ pt today as reassessment is needed for planned surgery tomorrow and before pt can go for scheduled DG pelvis comp.  Spoke w/ PT secretary around 1500 as had not been to see pt.  Was told that pt was not scheduled to be seen today as only can see 3x/week max.  Explained situation and was told that a page was sent out to all PT stating that it was urgent pt be seen.  Was told that a couple PTs were working late and that someone would work w/ pt.    Spoke w/ ortho provider  as PT had still not been to see pt and I have been unable to get in touch w/ anyone from that department.  Per ortho, if pt is not seen by PT tonight she can be seen tomorrow and surgery will be delayed a day. Per Ortho, pt will be here through wkend regardless.  Relayed all to family who are very frustrated but state understanding.    CT abd also came back showing no ileus/obstruction.  Pt will be NPO at midnight so messaged attending MD re: can we switch back to regular diet for dinner.

## 2017-03-22 NOTE — NC FL2 (Signed)
[place] MEDICAID FL2 LEVEL OF CARE SCREENING TOOL     IDENTIFICATION  Patient Name: Tracey Porter Birthdate: 10/15/1929 Sex: female Admission Date (Current Location): 03/19/2017  [place] [facility] [place]  [place]   Facility and Address:  [facility], [address], [place], [place] [address]      Provider Number: [id number]  Attending Physician Name and Address:  Narda BondsNettey, Ralph A, MD  Relative Name and Phone Number:       Current Level of Care: Hospital Recommended Level of Care: Skilled Nursing Facility Prior Approval Number:    Date Approved/Denied:   PASRR Number: [id number]  Discharge Plan: SNF    Current Diagnoses: Patient Active Problem List   Diagnosis Date Noted  . Chronic diastolic heart failure (HCC)   . Multiple fractures of pelvis with unstable disruption of pelvic ring, initial encounter for closed fracture (HCC) 03/20/2017  . Atrial fibrillation with RVR (HCC) 03/20/2017  . Aortic atherosclerosis (HCC) 03/20/2017  . Chronic atrial fibrillation (HCC) 03/20/2017  . Hypotension due to blood loss   . Coagulopathy (HCC)   . Closed pelvic fracture (HCC) 03/19/2017  . Leukocytosis 03/19/2017  . Fall at home, initial encounter 03/19/2017  . Paroxysmal SVT (supraventricular tachycardia) (HCC)   . Essential hypertension   . DM2 (diabetes mellitus, type 2) (HCC) 03/20/2015  . Hypokalemia 03/19/2015    Orientation RESPIRATION BLADDER Height & Weight     Self, Time, Situation, Place  O2 (2L Petros) Incontinent, Indwelling catheter Weight: 170 lb (77.1 kg) (per NT) Height:  5\' 6"  (167.6 cm)  BEHAVIORAL SYMPTOMS/MOOD NEUROLOGICAL BOWEL NUTRITION STATUS      Continent Diet (carb modified, cardiac)  AMBULATORY STATUS COMMUNICATION OF NEEDS Skin   Extensive Assist Verbally Normal                       Personal Care Assistance Level of Assistance  Bathing, Dressing, Feeding Bathing Assistance: Maximum assistance Feeding  assistance: Limited assistance Dressing Assistance: Maximum assistance     Functional Limitations Info             SPECIAL CARE FACTORS FREQUENCY  PT (By licensed PT), OT (By licensed OT)     PT Frequency: 5/wk OT Frequency: 5/wk            Contractures      Additional Factors Info  Code Status, Allergies, Insulin Sliding Scale Code Status Info: FULL Allergies Info: Penicillins   Insulin Sliding Scale Info: 6/day       Current Medications (03/22/2017):  This is the current hospital active medication list Current Facility-Administered Medications  Medication Dose Route Frequency Provider Last Rate Last Dose  . iopamidol (ISOVUE-300) 61 % injection           . 0.9 %  sodium chloride infusion   Intravenous Continuous Narda BondsNettey, Ralph A, MD 75 mL/hr at 03/22/17 0456    . acetaminophen (TYLENOL) tablet 650 mg  650 mg Oral Q6H PRN Therisa Doyneoutova, Anastassia, MD   650 mg at 03/21/17 1006   Or  . acetaminophen (TYLENOL) suppository 650 mg  650 mg Rectal Q6H PRN Doutova, Anastassia, MD      . acyclovir (ZOVIRAX) tablet 800 mg  800 mg Oral BID Therisa Doyneoutova, Anastassia, MD   800 mg at 03/22/17 0844  . albuterol (PROVENTIL) (2.5 MG/3ML) 0.083% nebulizer solution 2.5 mg  2.5 mg Nebulization Q6H PRN Narda BondsNettey, Ralph A, MD      . amiodarone (NEXTERONE PREMIX) 360-4.14 MG/200ML-% (1.8  mg/mL) IV infusion  60 mg/hr Intravenous Continuous Timoteo Expose T, MD 16.7 mL/hr at 03/22/17 0933 30 mg/hr at 03/22/17 0933  . atorvastatin (LIPITOR) tablet 40 mg  40 mg Oral Daily Doutova, Jonny Ruiz, MD   40 mg at 03/22/17 0844  . diltiazem (CARDIZEM) 1 mg/mL load via infusion 10 mg  10 mg Intravenous Once Narda Bonds, MD      . diltiazem (CARDIZEM) 100 mg in dextrose 5 % 100 mL (1 mg/mL) infusion  5-15 mg/hr Intravenous Titrated Turner, Traci R, MD      . HYDROcodone-acetaminophen (NORCO/VICODIN) 5-325 MG per tablet 1-2 tablet  1-2 tablet Oral Q4H PRN Therisa Doyne, MD   1 tablet at 03/22/17 0845  . insulin  aspart (novoLOG) injection 0-9 Units  0-9 Units Subcutaneous Q4H Therisa Doyne, MD   1 Units at 03/22/17 1220  . LORazepam (ATIVAN) tablet 0.5 mg  0.5 mg Oral Q6H PRN Narda Bonds, MD   0.5 mg at 03/22/17 1317  . mirabegron ER (MYRBETRIQ) tablet 25 mg  25 mg Oral Daily Doutova, Anastassia, MD   25 mg at 03/22/17 0843  . morphine 4 MG/ML injection 2 mg  2 mg Intravenous Q4H PRN Doutova, Anastassia, MD      . ondansetron (ZOFRAN) tablet 4 mg  4 mg Oral Q6H PRN Doutova, Anastassia, MD   4 mg at 03/21/17 2100   Or  . ondansetron (ZOFRAN) injection 4 mg  4 mg Intravenous Q6H PRN Therisa Doyne, MD   4 mg at 03/22/17 1321  . prednisoLONE acetate (PRED FORTE) 1 % ophthalmic suspension 1 drop  1 drop Right Eye BID Therisa Doyne, MD   1 drop at 03/22/17 0852  . senna (SENOKOT) tablet 8.6 mg  1 tablet Oral BID Therisa Doyne, MD   8.6 mg at 03/22/17 0845     Discharge Medications: Please see discharge summary for a list of discharge medications.  Relevant Imaging Results:  Relevant Lab Results:   Additional Information SS#: [id number]  [name], LCSW

## 2017-03-22 NOTE — Progress Notes (Signed)
Orthopedic Trauma Service Progress Note   Patient ID: Tracey Porter MRN: [id number] DOB/AGE: September 14, 1929 83 y.o.  Subjective:  Pt just returned from CT for abd series Doing ok Tired  Overall feels her pain is better  Has not worked with PT yet today  Sat on EOB yesterday   No flatus   No numbness or tingling to B LEx   ROS As above  Objective:   VITALS:   Vitals:   03/22/17 1400 03/22/17 1500 03/22/17 1600 03/22/17 1700  BP: 128/84 (!) 141/69 (!) 147/63   Pulse: 91 92 (!) 104 (!) 112  Resp: (!) 21 (!) 26 (!) 25 19  Temp:      TempSrc:      SpO2: 97% 95% 97% 99%  Weight:      Height:        Estimated body mass index is 27.44 kg/m as calculated from the following:   Height as of this encounter:  (1.676 m).   Weight as of this encounter: 77.1 kg (170 lb).   Intake/Output      09/10 0701 - 09/11 0700 09/11 0701 - 09/12 0700   P.O. 900 330   I.V. (mL/kg) 2033.9 (26.4) 1225 (15.9)   Total Intake(mL/kg) 2933.9 (38.1) 1555 (20.2)   Urine (mL/kg/hr) 1225 (0.7)    Total Output 1225     Net +1708.9 +1555          LABS  Results for orders placed or performed during the hospital encounter of 03/19/17 (from the past 24 hour(s))  Glucose, capillary     Status: Abnormal   Collection Time: 03/21/17  7:36 PM  Result Value Ref Range   Glucose-Capillary 164 (H) 65 - 99 mg/dL  CBC with Differential/Platelet     Status: Abnormal   Collection Time: 03/21/17  8:00 PM  Result Value Ref Range   WBC 22.4 (H) 4.0 - 10.5 K/uL   RBC 2.75 (L) 3.87 - 5.11 MIL/uL   Hemoglobin 8.4 (L) 12.0 - 15.0 g/dL   HCT 40.9 (L) 81.1 - 91.4 %   MCV 89.8 78.0 - 100.0 fL   MCH 30.5 26.0 - 34.0 pg   MCHC 34.0 30.0 - 36.0 g/dL   RDW 78.2 (H) 95.6 - 21.3 %   Platelets 212 150 - 400 K/uL   Neutrophils Relative % 83 %   Neutro Abs 18.6 (H) 1.7 - 7.7 K/uL   Lymphocytes Relative 8 %   Lymphs Abs 1.8 0.7 - 4.0 K/uL   Monocytes Relative 9 %   Monocytes Absolute  1.9 (H) 0.1 - 1.0 K/uL   Eosinophils Relative 0 %   Eosinophils Absolute 0.0 0.0 - 0.7 K/uL   Basophils Relative 0 %   Basophils Absolute 0.0 0.0 - 0.1 K/uL  Glucose, capillary     Status: Abnormal   Collection Time: 03/22/17 12:23 AM  Result Value Ref Range   Glucose-Capillary 154 (H) 65 - 99 mg/dL  Glucose, capillary     Status: Abnormal   Collection Time: 03/22/17  3:46 AM  Result Value Ref Range   Glucose-Capillary 157 (H) 65 - 99 mg/dL  CBC with Differential/Platelet     Status: Abnormal   Collection Time: 03/22/17  5:23 AM  Result Value Ref Range   WBC 18.6 (H) 4.0 - 10.5 K/uL   RBC 2.73 (L) 3.87 - 5.11 MIL/uL   Hemoglobin 8.2 (L) 12.0 - 15.0 g/dL   HCT 08.6 (L) 57.8 - 46.9 %   MCV 90.5 78.0 -  100.0 fL   MCH 30.0 26.0 - 34.0 pg   MCHC 33.2 30.0 - 36.0 g/dL   RDW 16.1 09.6 - 04.5 %   Platelets 212 150 - 400 K/uL   Neutrophils Relative % 85 %   Neutro Abs 15.7 (H) 1.7 - 7.7 K/uL   Lymphocytes Relative 5 %   Lymphs Abs 1.0 0.7 - 4.0 K/uL   Monocytes Relative 10 %   Monocytes Absolute 1.9 (H) 0.1 - 1.0 K/uL   Eosinophils Relative 0 %   Eosinophils Absolute 0.0 0.0 - 0.7 K/uL   Basophils Relative 0 %   Basophils Absolute 0.0 0.0 - 0.1 K/uL  Glucose, capillary     Status: Abnormal   Collection Time: 03/22/17  7:32 AM  Result Value Ref Range   Glucose-Capillary 159 (H) 65 - 99 mg/dL  CBC with Differential/Platelet     Status: Abnormal   Collection Time: 03/22/17  8:04 AM  Result Value Ref Range   WBC 19.6 (H) 4.0 - 10.5 K/uL   RBC 2.84 (L) 3.87 - 5.11 MIL/uL   Hemoglobin 8.5 (L) 12.0 - 15.0 g/dL   HCT 40.9 (L) 81.1 - 91.4 %   MCV 90.1 78.0 - 100.0 fL   MCH 29.9 26.0 - 34.0 pg   MCHC 33.2 30.0 - 36.0 g/dL   RDW 78.2 95.6 - 21.3 %   Platelets 222 150 - 400 K/uL   Neutrophils Relative % 87 %   Neutro Abs 17.1 (H) 1.7 - 7.7 K/uL   Lymphocytes Relative 4 %   Lymphs Abs 0.7 0.7 - 4.0 K/uL   Monocytes Relative 9 %   Monocytes Absolute 1.8 (H) 0.1 - 1.0 K/uL    Eosinophils Relative 0 %   Eosinophils Absolute 0.0 0.0 - 0.7 K/uL   Basophils Relative 0 %   Basophils Absolute 0.0 0.0 - 0.1 K/uL  Glucose, capillary     Status: Abnormal   Collection Time: 03/22/17 12:14 PM  Result Value Ref Range   Glucose-Capillary 142 (H) 65 - 99 mg/dL  Basic metabolic panel     Status: Abnormal   Collection Time: 03/22/17  1:45 PM  Result Value Ref Range   Sodium 135 135 - 145 mmol/L   Potassium 4.1 3.5 - 5.1 mmol/L   Chloride 105 101 - 111 mmol/L   CO2 23 22 - 32 mmol/L   Glucose, Bld 158 (H) 65 - 99 mg/dL   BUN 25 (H) 6 - 20 mg/dL   Creatinine, Ser 0.86 (H) 0.44 - 1.00 mg/dL   Calcium 7.9 (L) 8.9 - 10.3 mg/dL   GFR calc non Af Amer 47 (L) >60 mL/min   GFR calc Af Amer 55 (L) >60 mL/min   Anion gap 7 5 - 15     PHYSICAL EXAM:   Gen: in bed, NAD, appears well, pleasant, daughters at bedside  Pelvis: no instability, minimal tenderness on Left side with compression   Motor and sensory functions intact B LEx  Ext warm  + DP pulses B   Assessment/Plan:     Principal Problem:   Multiple fractures of pelvis with unstable disruption of pelvic ring, initial encounter for closed fracture (HCC) Active Problems:   DM2 (diabetes mellitus, type 2) (HCC)   Paroxysmal SVT (supraventricular tachycardia) (HCC)   Essential hypertension   Closed pelvic fracture (HCC)   Leukocytosis   Fall at home, initial encounter   Atrial fibrillation with RVR (HCC)   Aortic atherosclerosis (HCC)   Chronic atrial fibrillation (  HCC)   Hypotension due to blood loss   Coagulopathy (HCC)   Chronic diastolic heart failure (HCC)   Anti-infectives    Start     Dose/Rate Route Frequency Ordered Stop   03/20/17 0230  acyclovir (ZOVIRAX) tablet 800 mg     800 mg Oral 2 times daily 03/20/17 0220      .  POD/HD#: 2  83 y/o female s/p ground level fall with LC type pelvic ring fracture   -left LC type pelvic ring fracture, L sacral fracture  Clinically pt doing better  Want  her to work with PT again and then get follow up pelvic films  Looking at her CT from a little while ago her pelvis looks largely symmetric. The L sacrum is not too impacted and it does not appear it has migrated at all. Hematoma is also smaller than previous scan    TDWB L leg   WBAT R leg  Mobilize with PT  No ROM restrictions    Favoring non-op tx right now but will make NPO after MN   - Pain management:  Minimize narcotics in light of ileus  Will do IV tylenol for 24 hours   Avoid NSAIDs due to negative effects on bone healing    - ABL anemia/Hemodynamics  Cbc in am   - Medical issues   Per primary service     Ileus   Per primary   Potassium looks good    Likely related to pelvic ring fracture/hematoma, immobility, opioids,    Pt on fosamax PTA, would dc this medication at dc due to MOA and how it will impair correct bone healing pathway  Will discuss with family to see how long she has been on this medication to see if she needs drug holiday    May want to consider forteo which could be used during the healing process as it is the only anabolic agent on market at this time    - DVT/PE prophylaxis:  SCDs  Will resume eliquis once H/H stabilized  - Metabolic Bone Disease:  Labs pending  Hold and dc fosamax   - Activity:  TDWB L leg  - FEN/GI prophylaxis/Foley/Lines:  NPO after MN (pt currently NPO)  Continue IVF, fluids only at 75 cc/hr right now. May want to increase if going to keep NPO   - Impediments to fracture healing:  Osteoporosis, fosamax, DM  - Dispo:  PT/OT continue to mobilize  Follow up pelvic plain films  Possible OR tomorrow for SI screw but favoring non-op tx      Mearl LatinKeith W. Sana Tessmer, PA-C [facility] [phone](P) [phone] (O) 03/22/2017, 5:06 PM

## 2017-03-22 NOTE — Progress Notes (Signed)
Progress Note  Patient Name: Tracey Porter Date of Encounter: 03/22/2017  Primary Cardiologist: Dr. Nicholaus Bloom  Subjective   Denies any chest pain or SOB.  Cardizem stopped yesterday by nurse and HR increased and Amio was increased to /hr.  HR now in the 140's.  BP stable.   Inpatient Medications    Scheduled Meds: . acyclovir  800 mg Oral BID  . atorvastatin  40 mg Oral Daily  . insulin aspart  0-9 Units Subcutaneous Q4H  . mirabegron ER  25 mg Oral Daily  . prednisoLONE acetate  1 drop Right Eye BID  . senna  1 tablet Oral BID   Continuous Infusions: . sodium chloride 75 mL/hr at 03/22/17 0456  . amiodarone 60 mg/hr (03/22/17 0629)  . diltiazem (CARDIZEM) infusion 12.5 mg/hr (03/21/17 0547)   PRN Meds: acetaminophen **OR** acetaminophen, HYDROcodone-acetaminophen, morphine injection, ondansetron **OR** ondansetron (ZOFRAN) IV   Vital Signs    Vitals:   03/22/17 0600 03/22/17 0700 03/22/17 0734 03/22/17 0800  BP: (!) 165/85 (!) 162/94 (!) 159/94   Pulse: (!) 114 (!) 148 (!) 143 (!) 132  Resp: (!) 21 17 (!) 22 20  Temp:   98.8 F (37.1 C)   TempSrc:   Axillary   SpO2: 98% 98% 98% 97%  Weight:      Height:        Intake/Output Summary (Last 24 hours) at 03/22/17 0811 Last data filed at 03/22/17 0700  Gross per 24 hour  Intake          2821.36 ml  Output             1225 ml  Net          1596.36 ml   Filed Weights   03/20/17 0150  Weight: 170 lb (77.1 kg)    Telemetry    Atrial fibrillation with RVR - Personally Reviewed  ECG    No new EKG to review - Personally Reviewed  Physical Exam   GEN: NAD Neck: no JVD Cardiac: irregularly irregular and tachy Respiratory: CTA bilaterally GI: soft, NT, ND with active BS MS: no edema Neuro: nonfocal Psych: normal affect  Labs    Chemistry  Recent Labs Lab 03/19/17 2010 03/20/17 0404 03/21/17 0226  NA 137 136 138  K 3.5 4.1 4.9  CL 101 101 106  CO2 24 24 21*  GLUCOSE 135* 239* 156*  BUN  21* 21* 36*  CREATININE 0.86 0.98 2.66*  CALCIUM 9.4 8.9 8.2*  PROT 7.1 6.6  --   ALBUMIN 4.0 3.5  --   AST 23 24  --   ALT 24 22  --   ALKPHOS 74 61  --   BILITOT 0.8 1.0  --   GFRNONAA 60* 51* 15*  GFRAA >60 59* 18*  ANIONGAP Hematology  Recent Labs Lab 03/21/17 1346 03/21/17 2000 03/22/17 0523  WBC 24.0* 22.4* 18.6*  RBC 2.82* 2.75* 2.73*  HGB 8.5* 8.4* 8.2*  HCT 25.2* 24.7* 24.7*  MCV 89.4 89.8 90.5  MCH 30.1 30.5 30.0  MCHC 33.7 34.0 33.2  RDW 15.4 15.6* 15.4  PLT 207 212 212    Cardiac EnzymesNo results for input(s): TROPONINI in the last 168 hours. No results for input(s): TROPIPOC in the last 168 hours.   BNPNo results for input(s): BNP, PROBNP in the last 168 hours.   DDimer No results for input(s): DDIMER in the last 168 hours.   Radiology  Ct Pelvis Wo Contrast  Result Date: 03/20/2017 CLINICAL DATA:  Follow up pelvic fracture. Initial encounter. EXAM: CT PELVIS WITHOUT CONTRAST TECHNIQUE: Multidetector CT imaging of the pelvis was performed following the standard protocol without intravenous contrast. COMPARISON:  Yesterday FINDINGS: Urinary Tract: The bladder is displaced to the left and partially effaced. Bowel: Postoperative rectosigmoid junction for uncertain indication. No visible bowel injury. Vascular/Lymphatic: Bilateral pelvic hematoma, significantly increased on the right compared to prior, where there is tracking into the lower abdominal extraperitoneal space. Right pelvic sidewall hematoma measures up to 12 cm by 8 cm on axial slices. The left pelvic high-density areas noted on previous CT are likely lymph nodes given their stable density and shape. There is atherosclerotic calcification. 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 Reproductive:  Hysterectomy.  Pelvic floor laxity. Other:  No pneumoperitoneum or definite peritoneal fluid. Musculoskeletal: Left obturator ring  fractures through the mid inferior pubic ramus at the puboacetabular junction. There is left sacral ala fracture extending to the sacroiliac joint inferiorly; no displacement along the sacral foramina. Segmental fracturing of the right inferior pubic ramus and single fracture in the right superior pubic ramus. No pelvic diastasis. No proximal hip fracture or dislocation. L5 left transverse process fracture. Lower lumbar disc and facet degeneration with grade 1 L4-5 anterolisthesis. Critical Value/emergent results were called by telephone at the time of interpretation on 03/20/2017 at 4:51 pm to Dr. Jacquelin HawkingALPH NETTEY , who verbally acknowledged these results. IMPRESSION: 1. Large extraperitoneal pelvic hematoma, especially on the right, significantly increased from previous scan. Right-sided hematoma measures up to 12 x 8 cm. 2. Bilateral obturator ring, left L5 transverse process, and left sacral ala fractures as described. 3. Atherosclerosis. Intimal plaque distortion at the right common and external iliac suggests remote dissection. Electronically Signed   By: Marnee SpringJonathon  Watts M.D.   On: 03/20/2017 16:56   Dg Pelvis Comp Min 3v  Result Date: 03/21/2017 CLINICAL DATA:  Fall, pelvic fracture EXAM: JUDET PELVIS - 3+ VIEW COMPARISON:  CT pelvis dated 03/20/2017 FINDINGS: Bilateral pelvic ring fractures, better evaluated on CT. Known left sacral fracture is not well visualized due to overlying bowel gas. IMPRESSION: Bilateral pelvic ring fractures, better evaluated on CT. Known left sacral fracture is not well visualized due to overlying bowel gas. Electronically Signed   By: Charline BillsSriyesh  Krishnan M.D.   On: 03/21/2017 09:49    Cardiac Studies   2D echo 05/19/2016 Study Conclusions  - Left ventricle: The cavity size was normal. Wall thickness was   normal. Systolic function was vigorous. The estimated ejection   fraction was in the range of 65% to 70%. Wall motion was normal;   there were no regional wall motion  abnormalities. Features are   consistent with a pseudonormal left ventricular filling pattern,   with concomitant abnormal relaxation and increased filling   pressure (grade 2 diastolic dysfunction). - Aortic valve: There was mild stenosis. - Mitral valve: Calcified annulus. Mildly calcified leaflets .   There was mild to moderate regurgitation. - Left atrium: The atrium was severely dilated. - Tricuspid valve: There was moderate regurgitation. - Pulmonary arteries: PA peak pressure: 44 mm Hg (S).  Nuclear stress test 02/22/2017 Study Highlights     Nuclear stress EF: 60%.  The left ventricular ejection fraction is normal (55-65%).  There was no ST segment deviation noted during stress.  The study is normal.  This is a low risk study.   Low risk  stress nuclear study with normal perfusion and normal left ventricular regional and global systolic function. Atrial fibrillation during the study.     Patient Profile     83 y.o. female with a history of hypertension, diabetes and permanent atrial fibrillation.  She had a myocardial perfusion scan done that showed an EF of around 60%.  She had an admission at [facility] for a hypertensive crisis and EF was reported to be low there however EF in 2017 was 60-65%.  She recently had been seen and her diltiazem had been increased because of increased ventricular response.  He had a negative myocardial perfusion scan with an EF of around 60% on August 4.  She had a mechanical fall and was admitted with a pelvic fracture with a question of active bleeding noted on CT scan.  She has become hypotensive and required fluid boluses.  Her hemoglobin dropped from 12.3-8.9.     Assessment & Plan    1.  Persistent atrial fibrillation - HR remains poorly controlled on and IV Amio.  Nursing turned off Cardizem gtt yesterday after starting Amio without an order to d/c.  HR increased and Amio increased to /hr.  HR now very high in the 140's.  She  is currently off anticoagulation due to pelvic bleed and anemia and timing of restarting anticoagulation will need to be determined by ortho.  This was discussed at length with her daughters.  They are concerned about her stroke risk from being off anticoagulation and in afib.  They understand that there is an increased risk of CVA but that her greater risk is on ongoing bleeding at this time and that anticoagulation in this setting is contraindicated for now.  I explained that there is a small chance of converting her to NSR on IV Amio but options for rate control are limited at this time due to borderline soft BP (SBP currently). - continue IV Amio gtt and decrease to /hr.   - bolus with Amio  IV now - If BP tolerates Amio bolus will restart Cardizem with  bolus IV and then gtt at /hr.   2.  Hypotension - this has resolved with blood products and volume resuscitation. SBP now elevated in the 140-160's.  3.  Chronic diastolic CHF with hypertensive heart disease - recent CHF exacerbation 2 months ago at [facility] in setting of hypertensive urgency - she appears euvolemic on exam today  - 2D echo a year ago was normal but then apparently repeated at [facility] and EF 45-50% recently but followup nuclear stress test showed normal LVF.   4.  Mild dementia by history  5.  Aortic atherosclerosis  6.  Moderate pulmonary HTN on echo 01/2017 at [facility] with PASP .        For questions or updates, please contact [facility] Please consult [email or web address] for contact info under Cardiology/STEMI. Daytime calls, contact the Day Call APP (6a-8a) or assigned team (Teams A-D) provider (7:30a - 5p). All other daytime calls (7:30-5p), contact the Card Master @ [phone].   Nighttime calls, contact the assigned APP (5p-8p) or MD (6:30p-8p). Overnight calls (8p-6a), contact the on call Fellow @ [phone].      Signed, Armanda Magic, MD  03/22/2017, 8:11 AM

## 2017-03-22 NOTE — Progress Notes (Addendum)
PROGRESS NOTE    Tracey Porter  ZOX:[id number] DOB: June 05, 1930 DOA: 03/19/2017 PCP: [place], Julie M, NP   Brief Narrative: Tracey Porter is a 83 y.o. female with medical history significant of HTN, DM2, A.fib on Eliquis, HLD, Mild dementia, Macular degeneration. She presented after a fall, tripping over a step and developed multiple pelvic fractures.   Assessment & Plan:   Principal Problem:   Multiple fractures of pelvis with unstable disruption of pelvic ring, initial encounter for closed fracture (HCC) Active Problems:   DM2 (diabetes mellitus, type 2) (HCC)   Paroxysmal SVT (supraventricular tachycardia) (HCC)   Essential hypertension   Closed pelvic fracture (HCC)   Leukocytosis   Fall at home, initial encounter   Atrial fibrillation with RVR (HCC)   Aortic atherosclerosis (HCC)   Chronic atrial fibrillation (HCC)   Hypotension due to blood loss   Coagulopathy (HCC)   Chronic diastolic heart failure (HCC)   Closed pelvic fracture Orthopedic surgery recommendations: bucks traction. Possible surgery pending physical therapy.  Pelvic hematoma Secondary to fracture. Significant blood loss into pelvis. Patient given Kcentra on 9/9 in addition to 2 units of PRBC which appears to have resolved bleeding and improved blood counts respectively. Still trending down but may be equilibrating.  -CBCs BID -SCD (right leg)  Anemia Secondary to blood loss. Improved with blood transfusions. Trended down slightly.  Atrial fibrillation with RVR S/p digoxin. Tachycardia persists. -cardiology recommendations: Cardizem discontinued. Started on amiodarone gtt. -continue telemetry (patient in stepdown)  Essential hypertension Patient with hypotension initially with now normotensive blood pressure after fluid and blood resuscitation.  Leukocytosis Improving. -CBCs as above  Diabetes mellitus Fasting blood sugar of 159 today which is adequate. Metformin as an  outpatient. -continue SSI  Abdominal distension Decreased bowel sounds Concern for ileus. Patient on narcotics (with bowel regimen) -KUB -if no obstruction, will be more aggressive with bowel regimen  Decreased breath sounds Would not be surprised if there is some atelectasis -chest x-ray -encourage incentive spirometer use  Addendum: Ileus seen on imaging. NPO. Reassess in AM   DVT prophylaxis: SCDs Code Status: Full code Family Communication: None at bedside Disposition Plan: Discharge pending orthopedic workup   Consultants:   Orthopedic surgery  Cardiology  PCCM  Procedures:   Kcentra (03/20/17)  2 units PRBC (03/20/17)  Antimicrobials:  None   Subjective: No bowel movement and has not passed gas.  Objective: Vitals:   03/22/17 0600 03/22/17 0700 03/22/17 0734 03/22/17 0800  BP: (!) 165/85 (!) 162/94 (!) 159/94 (!) 161/81  Pulse: (!) 114 (!) 148 (!) 143 (!) 132  Resp: (!) 21 17 (!) 22 20  Temp:   98.8 F (37.1 C)   TempSrc:   Axillary   SpO2: 98% 98% 98% 97%  Weight:      Height:        Intake/Output Summary (Last 24 hours) at 03/22/17 0913 Last data filed at 03/22/17 0800  Gross per 24 hour  Intake          2931.36 ml  Output             1225 ml  Net          1706.36 ml   Filed Weights   03/20/17 0150  Weight: 77.1 kg (170 lb)    Examination:  General exam: Appears calm and comfortable Respiratory system: Clear to auscultation with decreased breath sounds on left. Respiratory effort normal. Cardiovascular system: S1 & S2 heard, Irregular rhythm, fast heart rate. No murmurs. Gastrointestinal  system: Abdomen is distended, soft and mildly tender in lower quadrants. Normal bowel sounds heard. Central nervous system: Alert and oriented. No focal neurological deficits. Extremities: No edema. No calf tenderness. Left leg in traction. Skin: No cyanosis. No rashes Psychiatry: Judgement and insight appear normal. Mood & affect appropriate.      Data Reviewed: I have personally reviewed following labs and imaging studies  CBC:  Recent Labs Lab 03/21/17 0226 03/21/17 0740 03/21/17 1346 03/21/17 2000 03/22/17 0523  WBC 20.1* 22.6* 24.0* 22.4* 18.6*  NEUTROABS 16.2* 19.2* 20.5* 18.6* 15.7*  HGB 9.9* 9.2* 8.5* 8.4* 8.2*  HCT 30.0* 28.3* 25.2* 24.7* 24.7*  MCV 90.4 90.1 89.4 89.8 90.5  PLT 225 221 207 212 212   Basic Metabolic Panel:  Recent Labs Lab 03/19/17 2010 03/20/17 0404 03/21/17 0226  NA 137 136 138  K 3.5 4.1 4.9  CL 101 101 106  CO2 24 24 21*  GLUCOSE 135* 239* 156*  BUN 21* 21* 36*  CREATININE 0.86 0.98 2.66*  CALCIUM 9.4 8.9 8.2*  MG  --  1.5*  --   PHOS  --  4.5  --    GFR: Estimated Creatinine Clearance: 16.2 mL/min (A) (by C-G formula based on SCr of 2.66 mg/dL (H)). Liver Function Tests:  Recent Labs Lab 03/19/17 2010 03/20/17 0404  AST 23 24  ALT 24 22  ALKPHOS 74 61  BILITOT 0.8 1.0  PROT 7.1 6.6  ALBUMIN 4.0 3.5   No results for input(s): LIPASE, AMYLASE in the last 168 hours. No results for input(s): AMMONIA in the last 168 hours. Coagulation Profile:  Recent Labs Lab 03/19/17 2010  INR 1.22   Cardiac Enzymes: No results for input(s): CKTOTAL, CKMB, CKMBINDEX, TROPONINI in the last 168 hours. BNP (last 3 results) No results for input(s): PROBNP in the last 8760 hours. HbA1C:  Recent Labs  03/20/17 0404  HGBA1C 6.2*   CBG:  Recent Labs Lab 03/21/17 1705 03/21/17 1936 03/22/17 0023 03/22/17 0346 03/22/17 0732  GLUCAP 147* 164* 154* 157* 159*   Lipid Profile: No results for input(s): CHOL, HDL, LDLCALC, TRIG, CHOLHDL, LDLDIRECT in the last 72 hours. Thyroid Function Tests:  Recent Labs  03/20/17 0404  TSH 3.410   Anemia Panel: No results for input(s): VITAMINB12, FOLATE, FERRITIN, TIBC, IRON, RETICCTPCT in the last 72 hours. Sepsis Labs: No results for input(s): PROCALCITON, LATICACIDVEN in the last 168 hours.  Recent Results (from the past  240 hour(s))  Surgical pcr screen     Status: None   Collection Time: 03/20/17  1:50 AM  Result Value Ref Range Status   MRSA, PCR NEGATIVE NEGATIVE Final   Staphylococcus aureus NEGATIVE NEGATIVE Final    Comment: (NOTE) The Xpert SA Assay (FDA approved for NASAL specimens in patients 81 years of age and older), is one component of a comprehensive surveillance program. It is not intended to diagnose infection nor to guide or monitor treatment.          Radiology Studies: Ct Pelvis Wo Contrast  Result Date: 03/20/2017 CLINICAL DATA:  Follow up pelvic fracture. Initial encounter. EXAM: CT PELVIS WITHOUT CONTRAST TECHNIQUE: Multidetector CT imaging of the pelvis was performed following the standard protocol without intravenous contrast. COMPARISON:  Yesterday FINDINGS: Urinary Tract: The bladder is displaced to the left and partially effaced. Bowel: Postoperative rectosigmoid junction for uncertain indication. No visible bowel injury. Vascular/Lymphatic: Bilateral pelvic hematoma, significantly increased on the right compared to prior, where there is tracking into the lower abdominal extraperitoneal  space. Right pelvic sidewall hematoma measures up to 12 cm by 8 cm on axial slices. The left pelvic high-density areas noted on previous CT are likely lymph nodes given their stable density and shape. There is atherosclerotic calcification. 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 Reproductive:  Hysterectomy.  Pelvic floor laxity. Other:  No pneumoperitoneum or definite peritoneal fluid. Musculoskeletal: Left obturator ring fractures through the mid inferior pubic ramus at the puboacetabular junction. There is left sacral ala fracture extending to the sacroiliac joint inferiorly; no displacement along the sacral foramina. Segmental fracturing of the right inferior pubic ramus and single fracture in the right superior pubic ramus. No pelvic  diastasis. No proximal hip fracture or dislocation. L5 left transverse process fracture. Lower lumbar disc and facet degeneration with grade 1 L4-5 anterolisthesis. Critical Value/emergent results were called by telephone at the time of interpretation on 03/20/2017 at 4:51 pm to Dr. Jacquelin Hawking , who verbally acknowledged these results. IMPRESSION: 1. Large extraperitoneal pelvic hematoma, especially on the right, significantly increased from previous scan. Right-sided hematoma measures up to 12 x 8 cm. 2. Bilateral obturator ring, left L5 transverse process, and left sacral ala fractures as described. 3. Atherosclerosis. Intimal plaque distortion at the right common and external iliac suggests remote dissection. Electronically Signed   By: Marnee Spring M.D.   On: 03/20/2017 16:56   Dg Pelvis Comp Min 3v  Result Date: 03/21/2017 CLINICAL DATA:  Fall, pelvic fracture EXAM: JUDET PELVIS - 3+ VIEW COMPARISON:  CT pelvis dated 03/20/2017 FINDINGS: Bilateral pelvic ring fractures, better evaluated on CT. Known left sacral fracture is not well visualized due to overlying bowel gas. IMPRESSION: Bilateral pelvic ring fractures, better evaluated on CT. Known left sacral fracture is not well visualized due to overlying bowel gas. Electronically Signed   By: Charline Bills M.D.   On: 03/21/2017 09:49   Dg Chest Port 1 View  Result Date: 03/22/2017 CLINICAL DATA:  Decreased breath sounds. History of diabetes, CHF, atrial fibrillation, hypertension, recent fall with multiple pelvic fractures EXAM: PORTABLE CHEST 1 VIEW COMPARISON:  Portable chest x-ray of March 19, 2017 FINDINGS: The right lung remains mildly hypoinflated. The left lung is better inflated. The cardiac silhouette is enlarged. The pulmonary vascularity is normal. There is no definite pleural effusion. There is calcification in the wall of the aortic arch. IMPRESSION: Persistent mild hypoinflation on the right. Stable cardiomegaly. No definite  pneumonia nor CHF. Electronically Signed   By: David  [place] M.D.   On: 03/22/2017 08:42   Dg Abd Portable 1v  Result Date: 03/22/2017 CLINICAL DATA:  Clinical constipation EXAM: PORTABLE ABDOMEN - 1 VIEW COMPARISON:  AP pelvis radiographs and pelvic CT scan of September 9th 2018 FINDINGS: There are loops of moderately distended gas-filled small and large bowel present there is a moderate amount of stool in the right colon and some within the descending colon. There is no definite rectal gas. No free extraluminal gas collections are observed. Gas in the left upper quadrant likely lies within the stomach but distention of the splenic flexure region could be present. Bilateral pelvic ring displaced fractures are again demonstrated. IMPRESSION: Gaseous distention of small and large bowel loops may reflect ileus or possible distal colonic obstructive process though I favor the former. No definite perforation. Abdominal and pelvic CT scanning would be a useful next imaging step. Electronically Signed   By: David  [place] M.D.   On: 03/22/2017 08:40  Scheduled Meds: . acyclovir  800 mg Oral BID  . atorvastatin  40 mg Oral Daily  . insulin aspart  0-9 Units Subcutaneous Q4H  . mirabegron ER  25 mg Oral Daily  . prednisoLONE acetate  1 drop Right Eye BID  . senna  1 tablet Oral BID   Continuous Infusions: . sodium chloride 75 mL/hr at 03/22/17 0456  . amiodarone 60 mg/hr (03/22/17 0629)  . amiodarone    . diltiazem (CARDIZEM) infusion 12.5 mg/hr (03/21/17 0547)     LOS: 2 days     Jacquelin Hawking, MD [facility] 03/22/2017, 9:13 AM Pager: [phone]  If 7PM-7AM, please contact night-coverage [email or web address] Password [facility] 03/22/2017, 9:13 AM

## 2017-03-22 NOTE — Progress Notes (Signed)
Pt stating she is short of breath.  Encouraged to use incentive spirometer/provided teaching. CXR showing no CHF exac. Or PNA. Spoke w/ RT who will assess pt.

## 2017-03-22 NOTE — Progress Notes (Signed)
Rt called to bedside by RN for expiratory wheezing, patient stated she was SOB, patient was slightly labored with tachypnea, RT heard diminished with slight air movement on auscultation, wheeze sound from secretions in throat. After tx patient able to clear secretions. RT order alb neb prn. Patient breath sounds are still diminished and clear, patient is in no apparent distress, vitals are stable, RT will continue to monitor.

## 2017-03-22 NOTE — Progress Notes (Signed)
Messaged attending MD: "Pt states SOB,RT saw and states clear lungs,CRX normal.Can we get something for anxiety? Also,ABD xray showing possible ileus vs. Obstruction."

## 2017-03-22 NOTE — Progress Notes (Addendum)
Pt still stating very SOB/increased work of breathing. Turned fluids down to 10ml for now. RT at bedside, states lungs sounds are diminished w/ slight air movement, secretions in throat. Had pt use IS again. Giving a nebulizer treatment now.

## 2017-03-22 NOTE — Telephone Encounter (Signed)
Daughter wanted to be sure that Dr [facility]

## 2017-03-22 NOTE — Progress Notes (Signed)
Amio bolus done, BP 145/89, HR currently in 80s-90s. Updated cardiology

## 2017-03-22 NOTE — Progress Notes (Signed)
Pts HR now trending up (ranging from 100-130s) BP has been elevated past 2 hours (see vitals) States no pain. NS  turned down to 10 for now. Relayed to cardiology

## 2017-03-22 NOTE — Progress Notes (Signed)
Cardiology paged for afib rate control. Verbal order received from Dr. Virgina OrganQureshi for dose change. See eMar for rate dose change.

## 2017-03-22 NOTE — Telephone Encounter (Signed)
Routed to MD as FYI.

## 2017-03-22 NOTE — Progress Notes (Signed)
Pt complaining of constipation, states doesn't feel like shes passing gas. Messaged attending MD for PRN in addition to the senokot

## 2017-03-23 ENCOUNTER — Inpatient Hospital Stay (HOSPITAL_COMMUNITY): Payer: Medicare Other

## 2017-03-23 DIAGNOSIS — I2510 Atherosclerotic heart disease of native coronary artery without angina pectoris: Secondary | ICD-10-CM

## 2017-03-23 LAB — BASIC METABOLIC PANEL
ANION GAP: 7 (ref 5–15)
BUN: 22 mg/dL — ABNORMAL HIGH (ref 6–20)
CALCIUM: 8.3 mg/dL — AB (ref 8.9–10.3)
CHLORIDE: 101 mmol/L (ref 101–111)
CO2: 26 mmol/L (ref 22–32)
Creatinine, Ser: 0.84 mg/dL (ref 0.44–1.00)
GFR calc non Af Amer: >60 mL/min (ref >60)
Glucose, Bld: 148 mg/dL — ABNORMAL HIGH (ref 65–99)
Potassium: 3.9 mmol/L (ref 3.5–5.1)
Sodium: 134 mmol/L — ABNORMAL LOW (ref 135–145)

## 2017-03-23 LAB — GLUCOSE, CAPILLARY
GLUCOSE-CAPILLARY: 161 mg/dL — AB (ref 65–99)
GLUCOSE-CAPILLARY: 133 mg/dL — AB (ref 65–99)
GLUCOSE-CAPILLARY: 125 mg/dL — AB (ref 65–99)
Glucose-Capillary: 117 mg/dL — ABNORMAL HIGH (ref 65–99)
Glucose-Capillary: 119 mg/dL — ABNORMAL HIGH (ref 65–99)
Glucose-Capillary: 132 mg/dL — ABNORMAL HIGH (ref 65–99)
Glucose-Capillary: 150 mg/dL — ABNORMAL HIGH (ref 65–99)

## 2017-03-23 LAB — CBC WITH DIFFERENTIAL/PLATELET
BASOS PCT: 0 %
Basophils Absolute: 0 10*3/uL (ref 0.0–0.1)
Eosinophils Absolute: 0 10*3/uL (ref 0.0–0.7)
Eosinophils Relative: 0 %
HEMATOCRIT: 25.6 % — AB (ref 36.0–46.0)
HEMOGLOBIN: 8.5 g/dL — AB (ref 12.0–15.0)
LYMPHS ABS: 0.9 10*3/uL (ref 0.7–4.0)
LYMPHS PCT: 4 %
MCH: 30.2 pg (ref 26.0–34.0)
MCHC: 33.2 g/dL (ref 30.0–36.0)
MCV: 91.1 fL (ref 78.0–100.0)
MONOS PCT: 11 %
Monocytes Absolute: 2.5 10*3/uL — ABNORMAL HIGH (ref 0.1–1.0)
NEUTROS ABS: 19.6 10*3/uL — AB (ref 1.7–7.7)
NEUTROS PCT: 85 %
Platelets: 253 10*3/uL (ref 150–400)
RBC: 2.81 MIL/uL — ABNORMAL LOW (ref 3.87–5.11)
RDW: 15.4 % (ref 11.5–15.5)
WBC: 23 10*3/uL — ABNORMAL HIGH (ref 4.0–10.5)

## 2017-03-23 LAB — CALCITRIOL (1,25 DI-OH VIT D): VIT D 1 25 DIHYDROXY: 59.7 pg/mL (ref 19.9–79.3)

## 2017-03-23 LAB — VITAMIN D 25 HYDROXY (VIT D DEFICIENCY, FRACTURES): VIT D 25 HYDROXY: 35 ng/mL (ref 30.0–100.0)

## 2017-03-23 IMAGING — NM NM PULMONARY VENT & PERF
12 series · 12 of 12 positions shown · non-contrast
Comparison: CXR from earlier on the same day

CLINICAL DATA: Multiple pelvic fractures status post mechanical
fall complicated by pelvic hematoma.

EXAM:
NUCLEAR MEDICINE VENTILATION - PERFUSION LUNG SCAN
TECHNIQUE: Ventilation images were obtained in multiple projections using
inhaled aerosol Zc-EEm DTPA. Perfusion images were obtained in
multiple projections after intravenous injection of Zc-EEm MAA.
RADIOPHARMACEUTICALS:  30.4 mCi Cechnetium-HHm DTPA aerosol
inhalation and 4.36 mCi Cechnetium-HHm MAA IV

[Series 1: ant/post vent · 4.14mm/px · 1 of 1 slices shown (1 of 2)]
[im 1/1]
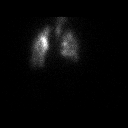

[Series 1: ant/post vent · 4.14mm/px · 1 of 1 slices shown (2 of 2)]
[im 1/1]
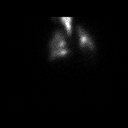

[Series 2: lao/rpo vent · 4.14mm/px · 1 of 1 slices shown (1 of 2)]
[im 1/1]
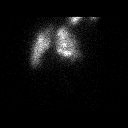

[Series 2: lao/rpo vent · 4.14mm/px · 1 of 1 slices shown (2 of 2)]
[im 1/1]
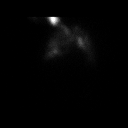

[Series 3: lpo/rao vent · 4.14mm/px · 1 of 1 slices shown (1 of 2)]
[im 1/1]
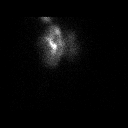

[Series 3: lpo/rao vent · 4.14mm/px · 1 of 1 slices shown (2 of 2)]
[im 1/1]
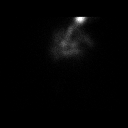

[Series 4: lpo/rao perf · 4.14mm/px · 1 of 1 slices shown (1 of 2)]
[im 1/1]
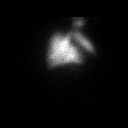

[Series 4: lpo/rao perf · 4.14mm/px · 1 of 1 slices shown (2 of 2)]
[im 1/1]
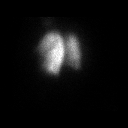

[Series 5: ant/post perf · 4.14mm/px · 1 of 1 slices shown (1 of 2)]
[im 1/1]
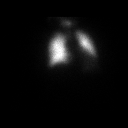

[Series 5: ant/post perf · 4.14mm/px · 1 of 1 slices shown (2 of 2)]
[im 1/1]
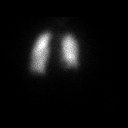

[Series 6: lao/rpo perf · 4.14mm/px · 1 of 1 slices shown (1 of 2)]
[im 1/1]
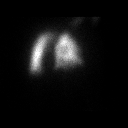

[Series 6: lao/rpo perf · 4.14mm/px · 1 of 1 slices shown (2 of 2)]
[im 1/1]
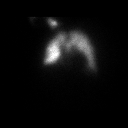

[12 of 12 positions shown; findings below may reference images not displayed]

FINDINGS: Ventilation: Central air trapping of inhaled aerosolized material.

Perfusion: No wedge shaped peripheral perfusion defects to suggest
acute pulmonary embolism.
IMPRESSION: No moderate or large perfusion-ventilation mismatches. Findings in
keeping with very low probability for pulmonary embolus.

## 2017-03-23 IMAGING — DX DG PELVIS 3+V JUDET
1 series · 3 of 3 positions shown · non-contrast
Comparison: CT abdomen and pelvis from yesterday.

CLINICAL DATA: Pelvic fractures.

EXAM:
JUDET PELVIS - 3+ VIEW

[Series 1: pelvis · 0.14mm/px · 3 of 3 slices shown]
[im 1/3]
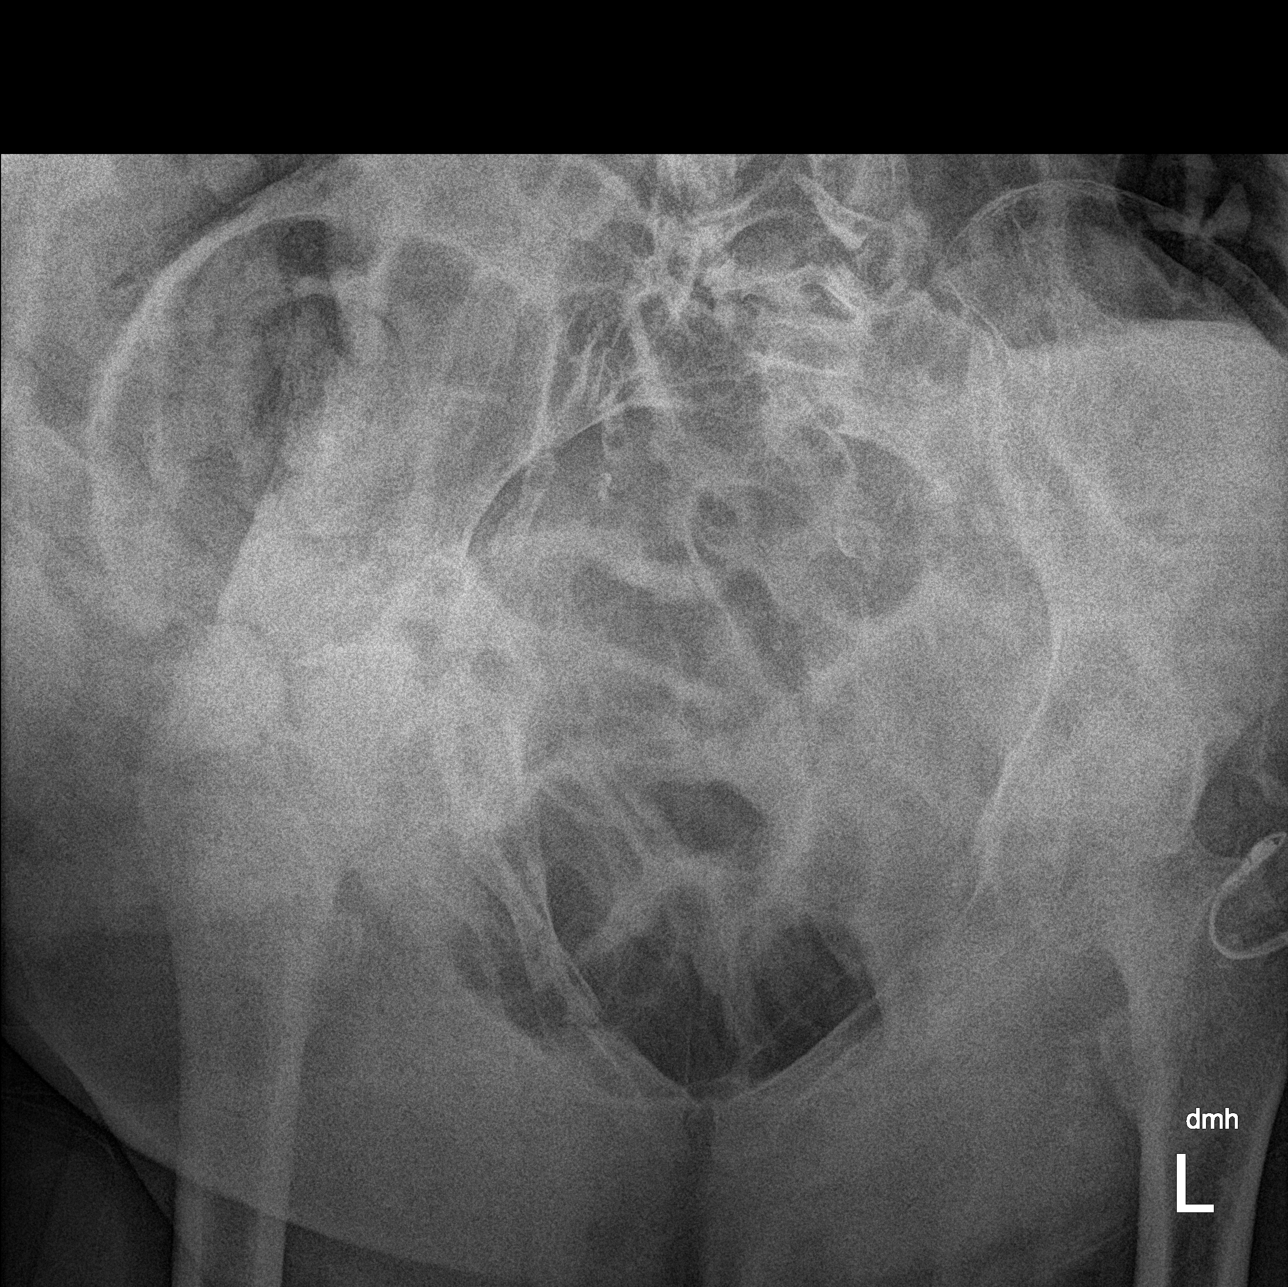
[im 2/3]
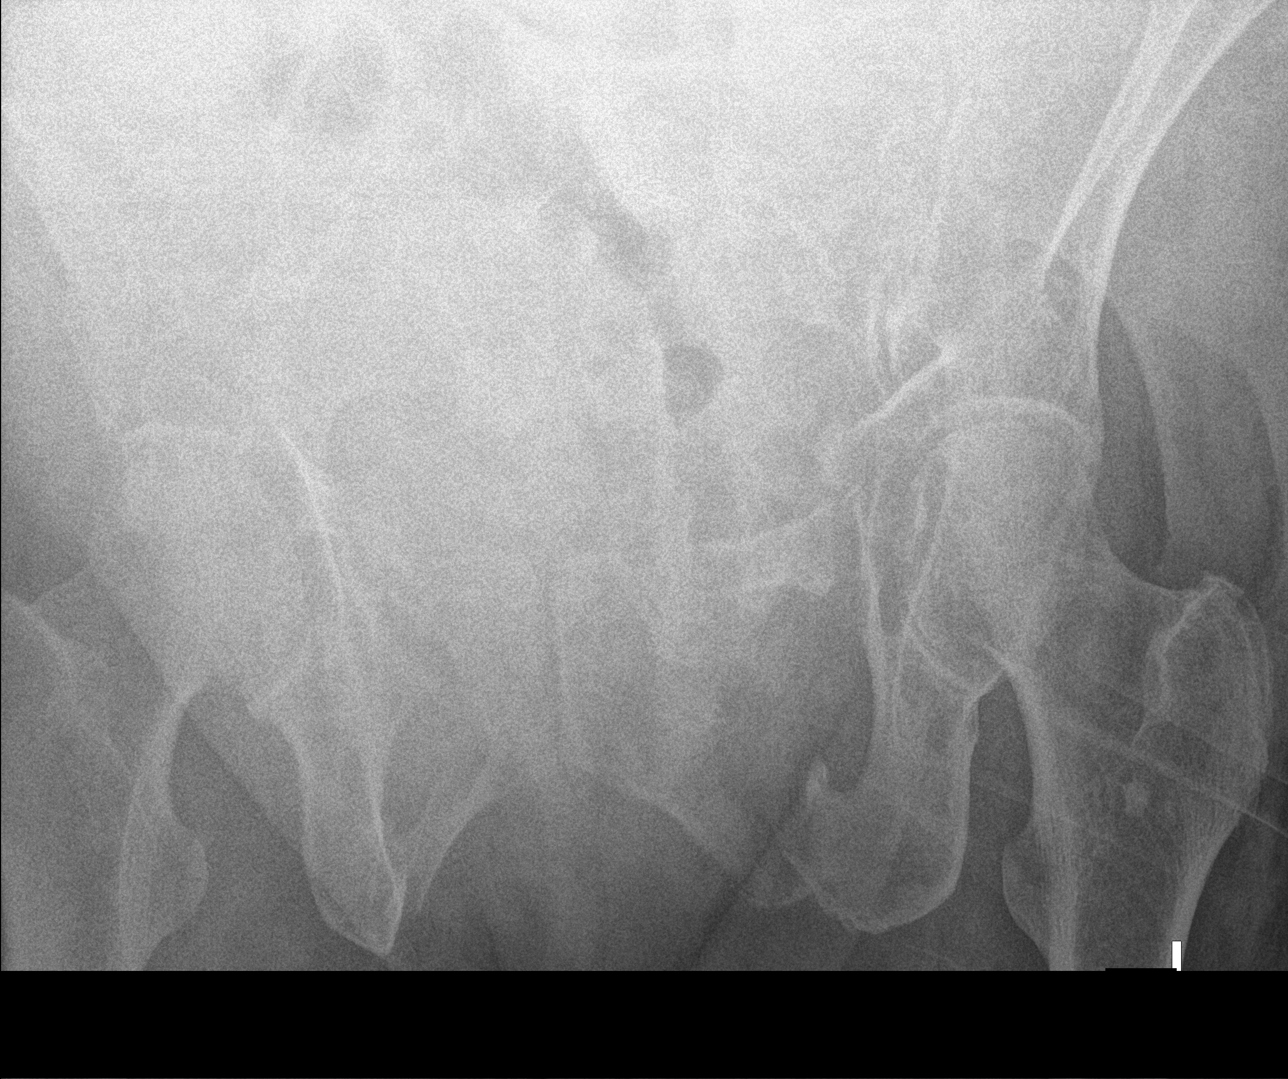
[im 3/3]
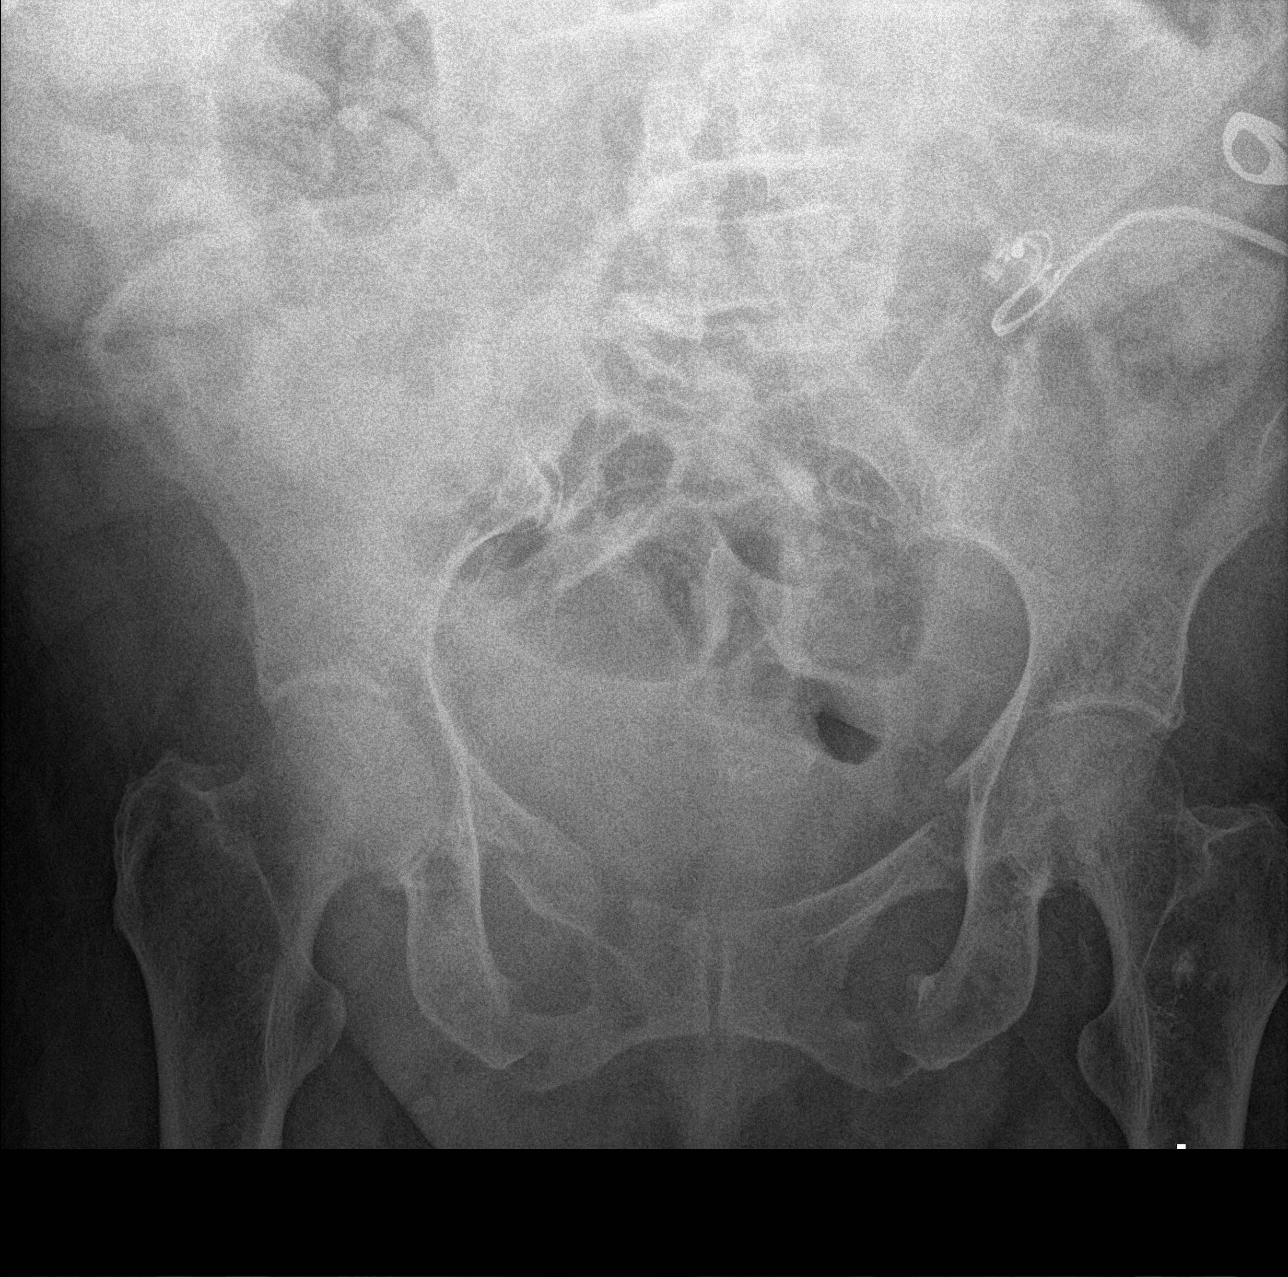

[3 of 3 positions shown; findings below may reference images not displayed]

FINDINGS: Again seen are mild inferiorly displaced fractures of the left
superior and inferior pubic rami. Nondisplaced fractures of the
right superior and inferior pubic rami. The pubic symphysis is
normal. No pelvic diastasis. The sacrum is obscured by bowel gas.
Degenerative changes of the bilateral hip joints. Osteopenia.
IMPRESSION: 1. Unchanged mild inferiorly displaced fractures of the left
superior and inferior pubic rami.
2. Unchanged nondisplaced fractures of the right superior and
inferior pubic rami.

## 2017-03-23 IMAGING — DX DG ABD PORTABLE 1V
1 series · 1 of 1 positions shown · non-contrast
Comparison: CT abdomen pelvis from yesterday.

CLINICAL DATA: Abdominal pain.

EXAM:
PORTABLE ABDOMEN - 1 VIEW

[abdomen]
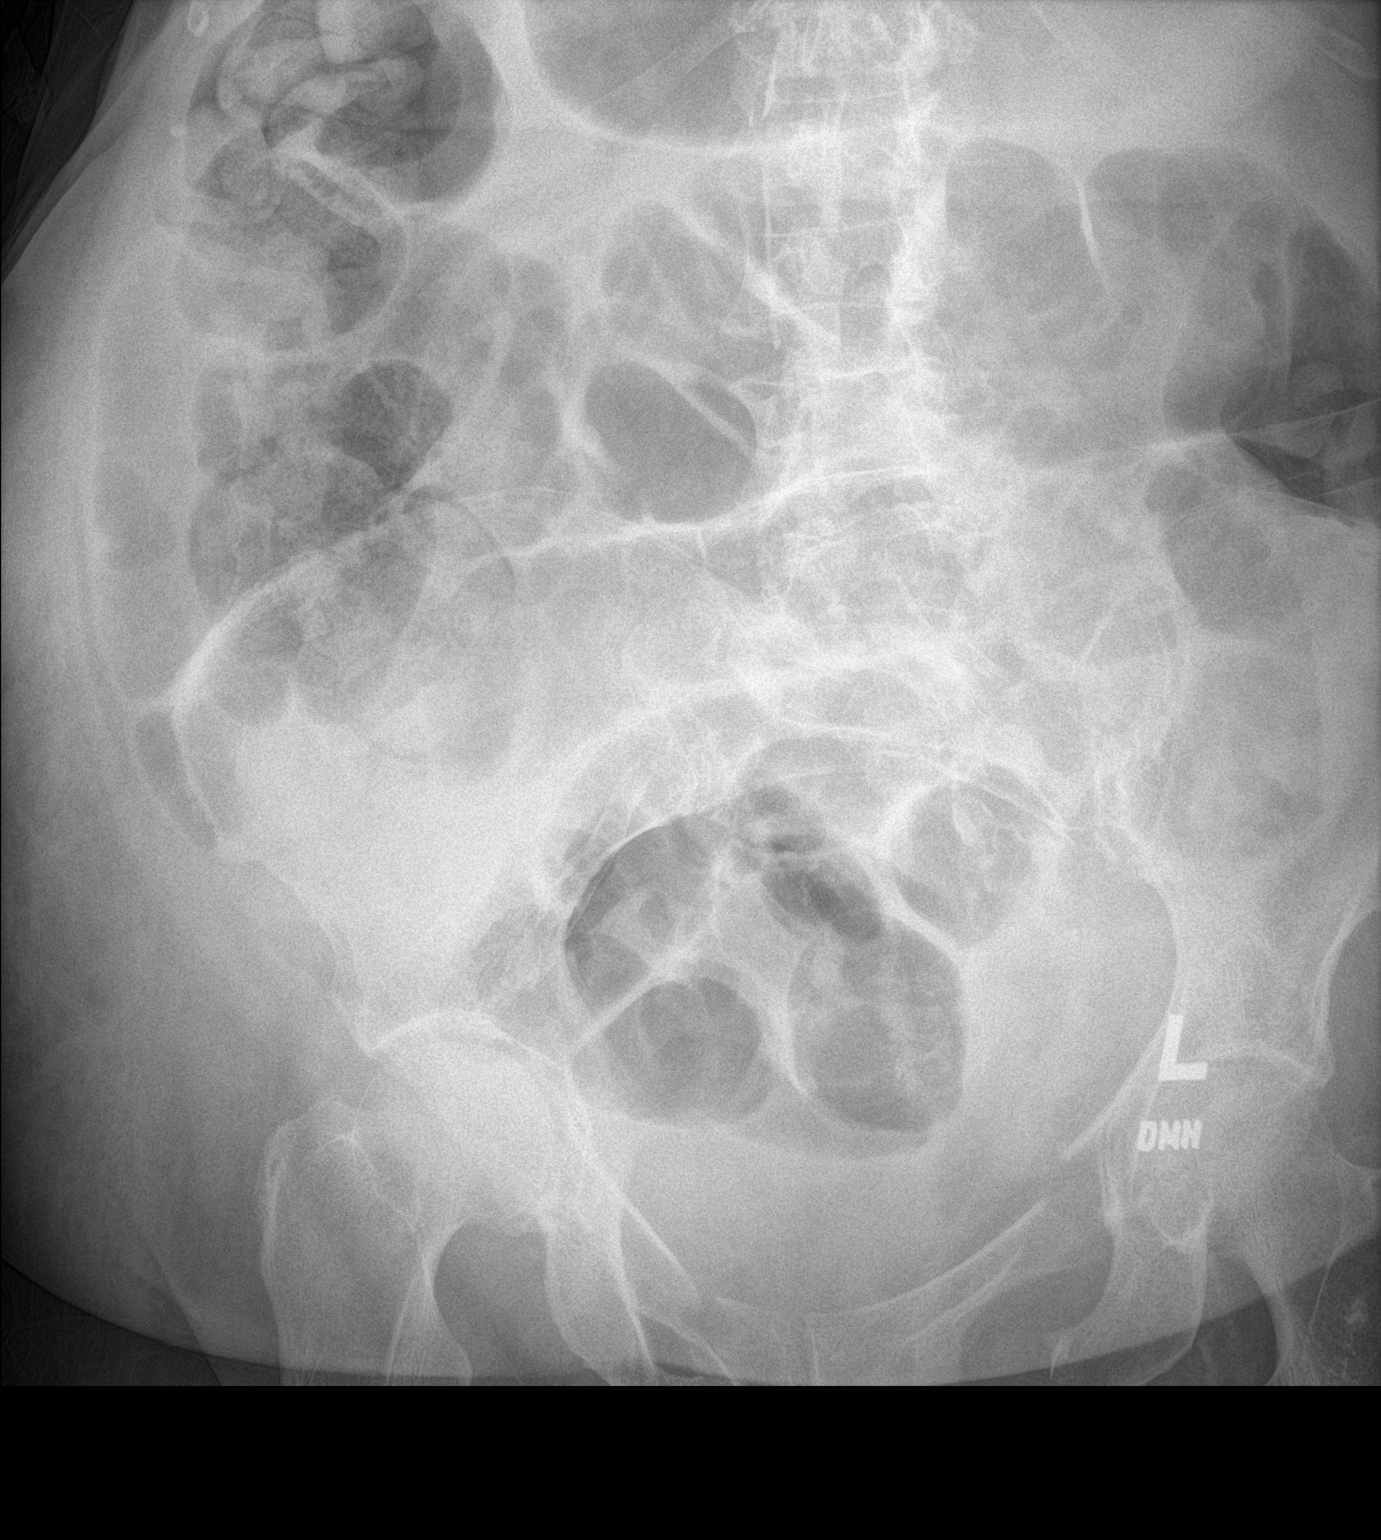

[1 of 1 positions shown; findings below may reference images not displayed]

FINDINGS: Multiple mildly dilated loops of air-filled small bowel are again
seen. Stool is noted in the right colon. Bilateral pubic rami
fractures.
IMPRESSION: Findings suggestive of ileus.

## 2017-03-23 IMAGING — DX DG CHEST 1V PORT
1 series · 1 of 1 positions shown · non-contrast
Comparison: March 22, 2017.

CLINICAL DATA: Shortness of breath.

EXAM:
PORTABLE CHEST 1 VIEW

[chest]
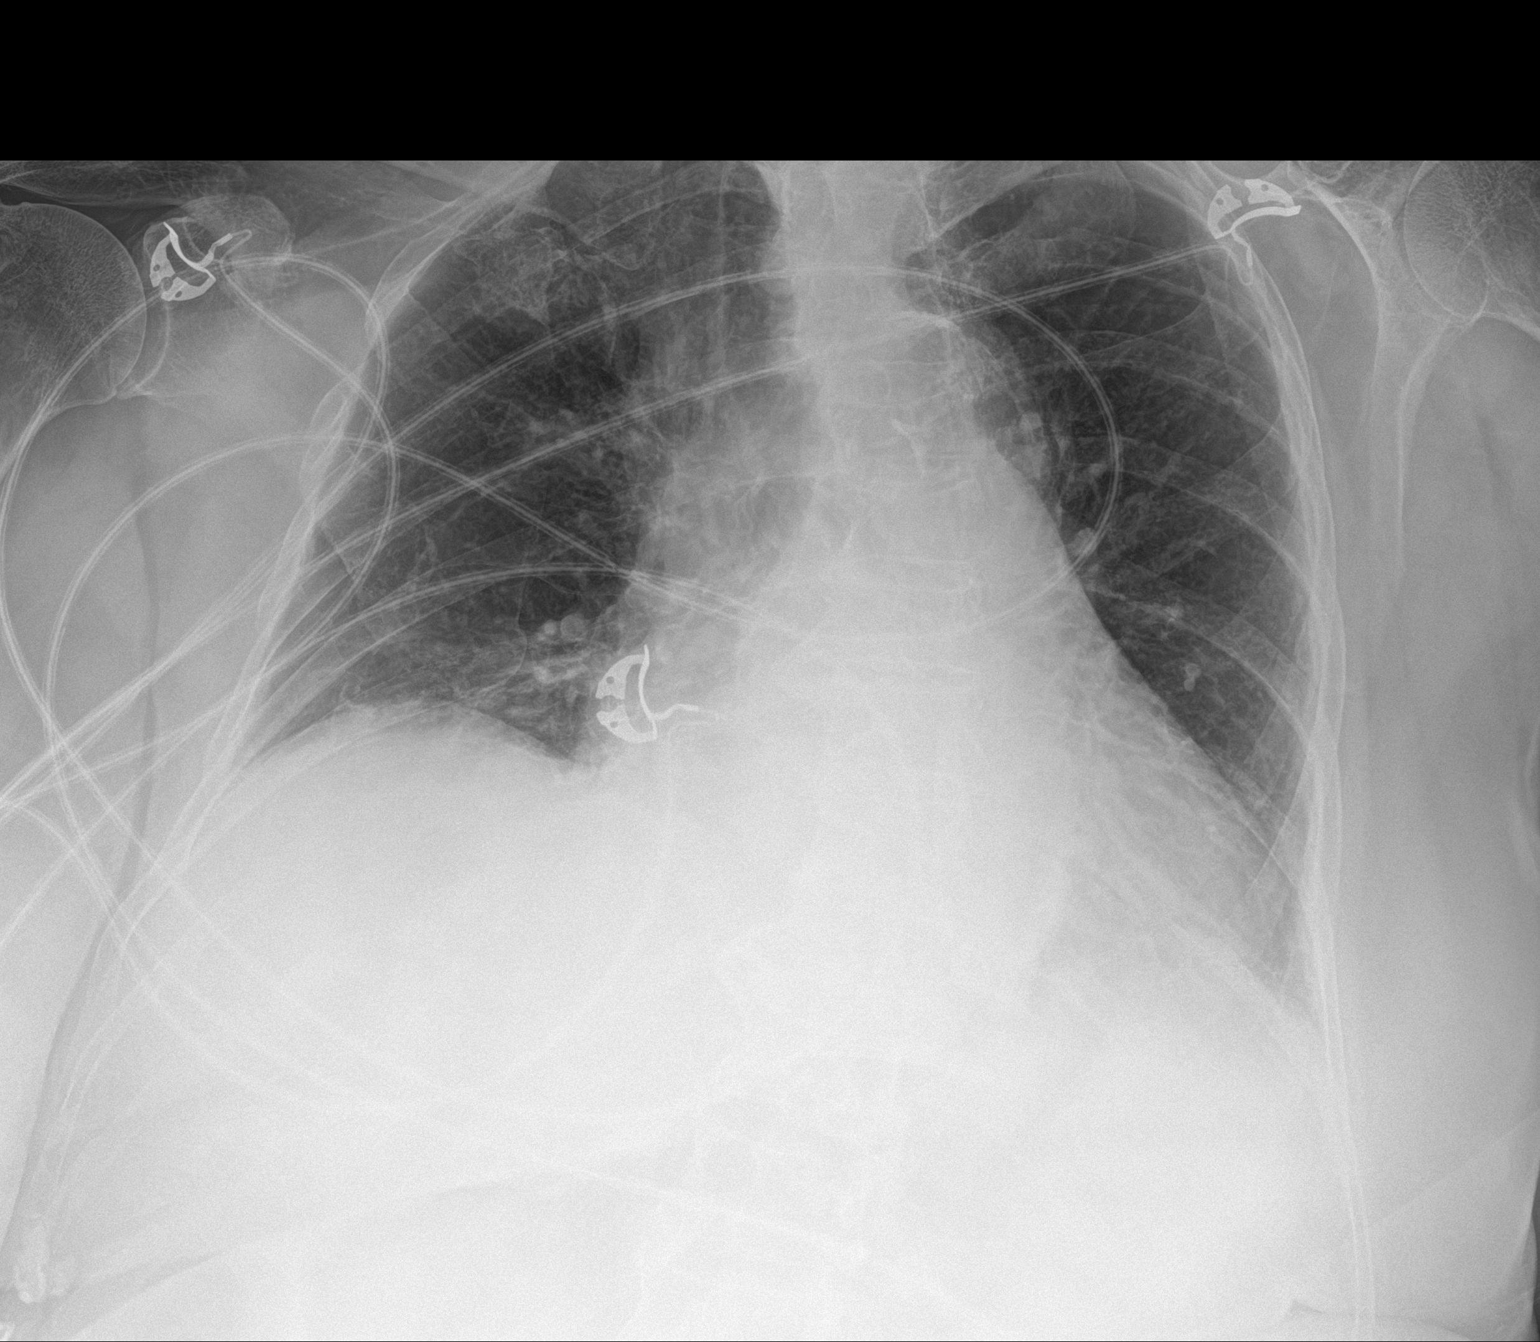

[1 of 1 positions shown; findings below may reference images not displayed]

FINDINGS: Stable cardiomegaly. Normal pulmonary vascularity. Persistent low
lung volumes and bibasilar atelectasis. No pleural effusion or
pneumothorax.
IMPRESSION: Stable cardiomegaly and low lung volumes with bibasilar atelectasis.

## 2017-03-23 MED ORDER — AMIODARONE IV BOLUS ONLY 150 MG/100ML
150.0000 mg | Freq: Once | INTRAVENOUS | Status: AC
  Administered 2017-03-23: 150 mg via INTRAVENOUS
  Filled 2017-03-23: qty 100

## 2017-03-23 MED ORDER — FUROSEMIDE 10 MG/ML IJ SOLN
INTRAMUSCULAR | Status: AC
  Administered 2017-03-23: 11:00:00
  Filled 2017-03-23: qty 2

## 2017-03-23 MED ORDER — TECHNETIUM TC 99M DIETHYLENETRIAME-PENTAACETIC ACID
30.4000 | Freq: Once | INTRAVENOUS | Status: AC | PRN
  Administered 2017-03-23: 30.4 via RESPIRATORY_TRACT

## 2017-03-23 MED ORDER — TECHNETIUM TO 99M ALBUMIN AGGREGATED
4.3600 | Freq: Once | INTRAVENOUS | Status: AC | PRN
  Administered 2017-03-23: 4.36 via INTRAVENOUS

## 2017-03-23 NOTE — Progress Notes (Signed)
PROGRESS NOTE    Tracey Porter  EAV:[id number] DOB: 11-Dec-1929 DOA: 03/19/2017 PCP: [place], Julie M, NP    Brief Narrative: 83 year old female admitted 03/19/2017 with status post mechanical fall. Patient was found to have multiple fractures of the pelvis and pelvic ring. Patient has history of atrial fibrillation on Cardizem and amiodarone as at this time. Her ejection fraction is 65%. CT scan of the pelvis showed evidence of hemorrhage. Patient receivedK centra and blood transfusions. Today when I saw the patient she was complaining of shortness of breath increased wheezing and tachypneic and tachycardic. Nurse reported that after the nebulizer treatment her wheezing got worse. She did receive a dose of morphine. She was also given 20 mg of Lasix. She remains on amiodarone and Cardizem at this time.    Assessment & Plan:   Principal Problem:   Multiple fractures of pelvis with unstable disruption of pelvic ring, initial encounter for closed fracture (HCC) Active Problems:   DM2 (diabetes mellitus, type 2) (HCC)   Paroxysmal SVT (supraventricular tachycardia) (HCC)   Essential hypertension   Closed pelvic fracture (HCC)   Leukocytosis   Fall at home, initial encounter   Atrial fibrillation with RVR (HCC)   Aortic atherosclerosis (HCC)   Chronic atrial fibrillation (HCC)   Hypotension due to blood loss   Coagulopathy (HCC)   Chronic diastolic heart failure (HCC)   Multiple pelvic fractures status post mechanical fall followed by ortho. Fractures complicated by hematoma currently being treated nonsurgically. Status post 2 units of blood transfusion andKcentra.  A. fib continue IV amiodarone and Cardizem. If patient continues with wheezing and tachypnea and tachycardia and he to rule out pulmonary embolism. Currently an oral anticoagulation is on hold due to pelvic hematoma from the fracture.  Leukocytosis slowly increasing white count. Check urine culture blood culture rule out any  infection. Chest x-ray done yesterday did not reveal any evidence of infiltrates or effusions or edema. A repeat chest x-ray has been ordered today.  His ileus patient will be made nothing by mouth with slow IV hydration. Minimize narcotics as much as possible. Follow-up K[facility] tomorrow.     DVT prophylaxis:  Code Status:full Family Communication:  Disposition Plan   Consultants:   Procedures:  Antimicrobials:    Subjective:   Objective: Vitals:   03/23/17 0300 03/23/17 0345 03/23/17 0400 03/23/17 0754  BP: (!) 165/97 (!) 128/58 (!) 152/90 (!) 154/66  Pulse: (!) 104 77 (!) 105 99  Resp: (!) 27 (!) 23 (!) 21 (!) 23  Temp:  (!) 96.6 F (35.9 C)  (!) 97.5 F (36.4 C)  TempSrc:  Axillary  Oral  SpO2: 98% 91% 99% 98%  Weight:      Height:        Intake/Output Summary (Last 24 hours) at 03/23/17 1221 Last data filed at 03/23/17 0500  Gross per 24 hour  Intake          1026.25 ml  Output              975 ml  Net            51.25 ml   Filed Weights   03/20/17 0150  Weight: 77.1 kg (170 lb)    Examination:  General exam: Appears calm and comfortable  Respiratory system:wheezing b/l.tachypneic. Cardiovascular system: S1 & S2 heard, RRR. No JVD, murmurs, rubs, gallops or clicks. No pedal edema. Gastrointestinal system: Abdomen is distended, and Normal bowel sounds heard. Central nervous system: Alert and oriented. No focal neurological  deficits. Extremities: Symmetric 5 x 5 power. Skin: No rashes, lesions or ulcers Psychiatry: Judgement and insight appear normal. Mood & affect appropriate.     Data Reviewed: I have personally reviewed following labs and imaging studies  CBC:  Recent Labs Lab 03/21/17 2000 03/22/17 0523 03/22/17 0804 03/22/17 1700 03/23/17 0428  WBC 22.4* 18.6* 19.6* 20.7* 23.0*  NEUTROABS 18.6* 15.7* 17.1* 17.5* 19.6*  HGB 8.4* 8.2* 8.5* 8.4* 8.5*  HCT 24.7* 24.7* 25.6* 24.9* 25.6*  MCV 89.8 90.5 90.1 90.5 91.1  PLT 212 212 222 230 253    Basic Metabolic Panel:  Recent Labs Lab 03/19/17 2010 03/20/17 0404 03/21/17 0226 03/22/17 1345 03/23/17 0428  NA 137 136 138 135 134*  K 3.5 4.1 4.9 4.1 3.9  CL 101 101 106 105 101  CO2 24 24 21* 23 26  GLUCOSE 135* 239* 156* 158* 148*  BUN 21* 21* 36* 25* 22*  CREATININE 0.86 0.98 2.66* 1.07* 0.84  CALCIUM 9.4 8.9 8.2* 7.9* 8.3*  MG  --  1.5*  --   --   --   PHOS  --  4.5  --   --   --    GFR: Estimated Creatinine Clearance: 51.3 mL/min (by C-G formula based on SCr of 0.84 mg/dL). Liver Function Tests:  Recent Labs Lab 03/19/17 2010 03/20/17 0404  AST 23 24  ALT 24 22  ALKPHOS 74 61  BILITOT 0.8 1.0  PROT 7.1 6.6  ALBUMIN 4.0 3.5   No results for input(s): LIPASE, AMYLASE in the last 168 hours. No results for input(s): AMMONIA in the last 168 hours. Coagulation Profile:  Recent Labs Lab 03/19/17 2010  INR 1.22   Cardiac Enzymes: No results for input(s): CKTOTAL, CKMB, CKMBINDEX, TROPONINI in the last 168 hours. BNP (last 3 results) No results for input(s): PROBNP in the last 8760 hours. HbA1C: No results for input(s): HGBA1C in the last 72 hours. CBG:  Recent Labs Lab 03/22/17 2001 03/23/17 0003 03/23/17 0349 03/23/17 0613 03/23/17 0750  GLUCAP 143* 153* 132* 150* 161*   Lipid Profile: No results for input(s): CHOL, HDL, LDLCALC, TRIG, CHOLHDL, LDLDIRECT in the last 72 hours. Thyroid Function Tests: No results for input(s): TSH, T4TOTAL, FREET4, T3FREE, THYROIDAB in the last 72 hours. Anemia Panel: No results for input(s): VITAMINB12, FOLATE, FERRITIN, TIBC, IRON, RETICCTPCT in the last 72 hours. Sepsis Labs: No results for input(s): PROCALCITON, LATICACIDVEN in the last 168 hours.  Recent Results (from the past 240 hour(s))  Surgical pcr screen     Status: None   Collection Time: 03/20/17  1:50 AM  Result Value Ref Range Status   MRSA, PCR NEGATIVE NEGATIVE Final   Staphylococcus aureus NEGATIVE NEGATIVE Final    Comment: (NOTE) The  Xpert SA Assay (FDA approved for NASAL specimens in patients 81 years of age and older), is one component of a comprehensive surveillance program. It is not intended to diagnose infection nor to guide or monitor treatment.          Radiology Studies: Ct Abdomen Pelvis Wo Contrast  Result Date: 03/22/2017 CLINICAL DATA:  Abdominal distention.  Pelvic fractures after fall. EXAM: CT ABDOMEN AND PELVIS WITHOUT CONTRAST TECHNIQUE: Multidetector CT imaging of the abdomen and pelvis was performed following the standard protocol without IV contrast. COMPARISON:  Pelvic CT 03/20/2017 FINDINGS: Lower chest: Cardiomegaly with coronary arteriosclerosis and aortic atherosclerosis. Small right pleural effusion with adjacent atelectasis. Trace left pleural effusion. Reflux of contrast is noted within the distal esophagus associated with  a small hiatal hernia. Hepatobiliary: No space-occupying mass of the liver. No liver laceration is apparent. No biliary dilatation. Gallbladder is distended without wall thickening or calculus possibly due to a fasting state. Pancreas: Normal appearance of the pancreas without inflammation or ductal dilatation. No space-occupying mass. Spleen: Normal spleen without mass. No evidence of splenic laceration or subcapsular fluid. Adrenals/Urinary Tract: Normal bilateral adrenal glands and kidneys. No obstructive uropathy. The urinary bladder is decompressed by Foley catheter and slightly deviated to the left secondary to the known extraperitoneal hematoma on the right. Stomach/Bowel: Moderate-to-marked gastric distention with enteric contrast. There is normal small bowel rotation. Contrast and fluid-filled distention of small bowel is identified with moderate colonic stool burden noted. No mechanical source obstruction is apparent. Findings may reflect an an ileus or possibly small bowel dysmotility. Vascular/Lymphatic: Slightly smaller right extraperitoneal hematoma now estimated at 9.9  x 7.2 cm on axial images acquired at the same level as prior. Previously this measured 12 x 8 cm. Moderate aortoiliac and branch vessel atherosclerosis. Reproductive: Status post hysterectomy.  Pelvic floor laxity. Other: No pneumoperitoneum or intraperitoneal fluid. Musculoskeletal: Fractures of the left obturator ring in the mid inferior pubic ramus at the puboacetabular junction, left sacral ale are fracture extending to the sacroiliac joint inferiorly, segmental fracturing of the right inferior pubic ramus and single fracture of the right superior pubic ramus are again noted without evidence of pelvic diastasis or proximal hip fractures. Left L5 transverse process fractures noted. There is lower lumbar degenerative disc and facet arthropathy with grade 1 anterolisthesis of L4 on L5. IMPRESSION: 1. Contrast and fluid distention of small bowel loops without mechanical source of obstruction identified. Findings likely represent an ileus or small bowel dysmotility. 2. Moderate-to-marked retention of enteric contrast noted within the stomach with small hiatal hernia. This likely contributes to the contrast seen in the distal esophagus from reflux. 3. Chronic acute pelvic fractures as above. 4. Slightly smaller right pelvic sidewall extraperitoneal hematoma. Currently this measures 9.9 x 7.2 cm versus 12 x 8 cm on axial images. 5. Small right pleural effusion. 6. Cardiomegaly with coronary arteriosclerosis. Electronically Signed   By: Tollie Eth M.D.   On: 03/22/2017 16:55   Dg Chest Port 1 View  Result Date: 03/22/2017 CLINICAL DATA:  Decreased breath sounds. History of diabetes, CHF, atrial fibrillation, hypertension, recent fall with multiple pelvic fractures EXAM: PORTABLE CHEST 1 VIEW COMPARISON:  Portable chest x-ray of March 19, 2017 FINDINGS: The right lung remains mildly hypoinflated. The left lung is better inflated. The cardiac silhouette is enlarged. The pulmonary vascularity is normal. There is no  definite pleural effusion. There is calcification in the wall of the aortic arch. IMPRESSION: Persistent mild hypoinflation on the right. Stable cardiomegaly. No definite pneumonia nor CHF. Electronically Signed   By: David  [place] M.D.   On: 03/22/2017 08:42   Dg Abd Portable 1v  Result Date: 03/22/2017 CLINICAL DATA:  Clinical constipation EXAM: PORTABLE ABDOMEN - 1 VIEW COMPARISON:  AP pelvis radiographs and pelvic CT scan of September 9th 2018 FINDINGS: There are loops of moderately distended gas-filled small and large bowel present there is a moderate amount of stool in the right colon and some within the descending colon. There is no definite rectal gas. No free extraluminal gas collections are observed. Gas in the left upper quadrant likely lies within the stomach but distention of the splenic flexure region could be present. Bilateral pelvic ring displaced fractures are again demonstrated. IMPRESSION: Gaseous distention of small and  large bowel loops may reflect ileus or possible distal colonic obstructive process though I favor the former. No definite perforation. Abdominal and pelvic CT scanning would be a useful next imaging step. Electronically Signed   By: David  [place] M.D.   On: 03/22/2017 08:40        Scheduled Meds: . furosemide      . acyclovir  800 mg Oral BID  . atorvastatin  40 mg Oral Daily  . insulin aspart  0-9 Units Subcutaneous Q4H  . mirabegron ER  25 mg Oral Daily  . prednisoLONE acetate  1 drop Right Eye BID  . senna  1 tablet Oral BID   Continuous Infusions: . sodium chloride 75 mL/hr at 03/22/17 0456  . amiodarone 30 mg/hr (03/23/17 0941)  . diltiazem (CARDIZEM) infusion 10 mg/hr (03/23/17 0213)     LOS: 3 days       Alwyn Ren, MD [facility]  If 7PM-7AM, please contact night-coverage [email or web address] Password [facility] 03/23/2017, 12:21 PM

## 2017-03-23 NOTE — Progress Notes (Signed)
Physical Therapy Treatment Patient Details Name: Tracey Porter MRN: [id number] DOB: 06/18/1930 Today's Date: 03/23/2017    History of Present Illness Pt is an 83 yo female admitted after a fall with pelvic fx w associated hemorrhage.  Pt with mild dementia, h/o afib, currently in afib with RVR. Pt was in traction but now out of traction and attempting conservative management with TDWB on LLE and WBAT RLE.    PT Comments    Continuing work on functional mobility and activity tolerance;  Transfers much improved today compared to last PT session; Still requiring +2 assist, but on 2nd and 3rd standing trials, mod assist, though likely putting more than TDWB LLE; Paged Marlena ClipperKieth with Ortho Trauma re: performance during session;   HR ranged 120s -140s throughout session, with one brief moment when observed 168 max (Cardiology had been in the room during session); BPs as follows:  Supine 130/92 HR 132 Sitting  116/61 HR 137 Sitting post 3-4 minutes              137/67 HR 130  Follow Up Recommendations  SNF;Supervision for mobility/OOB     Equipment Recommendations  Rolling walker with 5" wheels;3in1 (PT);Other (comment) (Youth-sized)    Recommendations for Other Services       Precautions / Restrictions Precautions Precautions: Fall Restrictions Weight Bearing Restrictions: Yes RLE Weight Bearing: Weight bearing as tolerated LLE Weight Bearing: Touchdown weight bearing    Mobility  Bed Mobility Overal bed mobility: Needs Assistance Bed Mobility: Supine to Sit;Sit to Supine     Supine to sit: Max assist;+2 for physical assistance Sit to supine: Total assist;+2 for physical assistance   General bed mobility comments: Pt c/o pain in pelvic area L greater than R affecting ability to move in bed. Assist with LEs, trunk and scooting bottom to EOB, as well as support at trunk  Transfers Overall transfer level: Needs assistance Equipment used: Rolling walker (2 wheeled) Transfers:  Sit to/from Stand Sit to Stand: +2 physical assistance;Total assist;Mod assist         General transfer comment: 3 Standing trials: First attempt with Total assist and acheived half-stand; 2nd and 3rd attempts much more successful, bed elevated, and kept hands on the RW with cues at elbow and shoulder girdle to use UEs for support -- moderate assist of 2; Lots of cues, including tactile, for TDWB LLE, it is highly likely that she is putting too much weight on LLE  Ambulation/Gait                 Stairs            Wheelchair Mobility    Modified Rankin (Stroke Patients Only)       Balance     Sitting balance-Leahy Scale: Fair Sitting balance - Comments: Pt able to sit at times without assist.  Min guard provided at times due to feeling dizzy.   Standing balance support: During functional activity Standing balance-Leahy Scale: Poor Standing balance comment: Dependent on UE suport                            Cognition Arousal/Alertness: Awake/alert Behavior During Therapy: WFL for tasks assessed/performed Overall Cognitive Status: History of cognitive impairments - at baseline                                 General Comments: Pt has h/o mild  dementia and did seem to have difficulty with problem solving but this may be her baseline.      Exercises General Exercises - Lower Extremity Ankle Circles/Pumps: AROM;Both;10 reps Quad Sets: AROM;Right;Left;10 reps Heel Slides: AROM;Right;Left;10 reps    General Comments        Pertinent Vitals/Pain Pain Assessment: 0-10 Pain Score: 9  (after 3 standing trials) Pain Location: pelvis L more than R Pain Descriptors / Indicators: Aching;Grimacing;Guarding Pain Intervention(s): Monitored during session;Other (comment) (RN prepared to give pain meds immediately after PT)    Home Living                      Prior Function            PT Goals (current goals can now be found in  the care plan section) Acute Rehab PT Goals Patient Stated Goal: to feel less pain PT Goal Formulation: With patient Time For Goal Achievement: 04/04/17 Potential to Achieve Goals: Fair Progress towards PT goals: Progressing toward goals (dep on next session, consider upgrading goals)    Frequency    Min 3X/week      PT Plan Current plan remains appropriate    Co-evaluation              AM-PAC PT "6 Clicks" Daily Activity  Outcome Measure  Difficulty turning over in bed (including adjusting bedclothes, sheets and blankets)?: Unable Difficulty moving from lying on back to sitting on the side of the bed? : Unable Difficulty sitting down on and standing up from a chair with arms (e.g., wheelchair, bedside commode, etc,.)?: Unable Help needed moving to and from a bed to chair (including a wheelchair)?: A Lot Help needed walking in hospital room?: Total Help needed climbing 3-5 steps with a railing? : Total 6 Click Score: 7    End of Session Equipment Utilized During Treatment: Gait belt Activity Tolerance: Patient tolerated treatment well Patient left: in bed;with call bell/phone within reach;with nursing/sitter in room;with family/visitor present Nurse Communication: Mobility status PT Visit Diagnosis: Unsteadiness on feet (R26.81);Dizziness and giddiness (R42);Muscle weakness (generalized) (M62.81);Pain Pain - part of body:  (pelvis)     Time: 1610-9604 PT Time Calculation (min) (ACUTE ONLY): 31 min  Charges:  $Therapeutic Activity: 23-37 mins                    G Codes:       Van Clines, PT  Acute Rehabilitation Services Pager [phone] Office [phone]    Levi Aland 03/23/2017, 10:34 AM

## 2017-03-23 NOTE — Progress Notes (Signed)
Patient was seen and examined this afternoon. She was able to mobilize with physical therapy without significant discomfort. Post mobilization films showed no significant displacement. I recommend nonoperative treatment for her pelvis fracture with TDWB. She may restart her Eliquis at the discretion of the cardiology and hospitalist team. I would like her to follow up in my clinic in 1-2 weeks for repeat x-rays, likely September 25th. No immobilization or ROM restrictions. Plan of care discussed with patient and her daughters.  Roby LoftsKevin P. Josean Lycan, MD [facility] [phone] (phone)

## 2017-03-23 NOTE — Progress Notes (Addendum)
CSW spoke with pt dtrs at bedside and provided with bed offers- top choices at this time are 1. [facility], 2. [facility] or [facility]  CSW will continue to follow  Burna SisJenna H. Laquinda Moller, LCSW Clinical Social Worker [phone]

## 2017-03-23 NOTE — Progress Notes (Signed)
Patient restless in bed complaining of pain to lef thip and not being able to be comfortable. Pain and anxiety medication given. Patient pulling and tubes and cords stating that they were not comfortable. Patient removed IV line, and I was able to restart IV for patient. Explained to patient the importance of leaving cords and lines alone, patient states understanding. Repositioned patient multiple times into a position of stated comfort.

## 2017-03-23 NOTE — Progress Notes (Signed)
Upon entering patients room, patient lying in a position of stated comfort, undressed and diaphoretic. Patient states she was hot, and got undressed. Patient also removed her nasal canula. Patient states "why are you in my house" when reorienting patient, patient states "oh yes, that's right" patient oriented to self. CBG checked for potential hypoglycemia. Patient has no complaints of pain.and is in no distress. Will continue to monitor.

## 2017-03-23 NOTE — Plan of Care (Signed)
Problem: Pain Managment: Goal: General experience of comfort will improve Outcome: Not Progressing Patient unable to remain comfortable despite attempts to reposition and medicate  Problem: Physical Regulation: Goal: Ability to maintain clinical measurements within normal limits will improve Outcome: Progressing Patient continues to require medication to regulate cardiac rhythm

## 2017-03-23 NOTE — Progress Notes (Signed)
Progress Note  Patient Name: Tracey Porter Date of Encounter: 03/23/2017  Primary Cardiologist: Dr. Tresa Endo  Subjective   HR improved overnight but HR now in 120-130's while working with PT.  Complains of pelvic pain  Inpatient Medications    Scheduled Meds: . acyclovir  800 mg Oral BID  . atorvastatin  40 mg Oral Daily  . insulin aspart  0-9 Units Subcutaneous Q4H  . mirabegron ER  25 mg Oral Daily  . prednisoLONE acetate  1 drop Right Eye BID  . senna  1 tablet Oral BID   Continuous Infusions: . sodium chloride 75 mL/hr at 03/22/17 0456  . acetaminophen Stopped (03/23/17 0615)  . amiodarone 60 mg/hr (03/23/17 0156)  . diltiazem (CARDIZEM) infusion 10 mg/hr (03/23/17 0213)   PRN Meds: albuterol, LORazepam, morphine injection, ondansetron **OR** ondansetron (ZOFRAN) IV   Vital Signs    Vitals:   03/23/17 0300 03/23/17 0345 03/23/17 0400 03/23/17 0754  BP: (!) 165/97 (!) 128/58 (!) 152/90 (!) 154/66  Pulse: (!) 104 77 (!) 105 99  Resp: (!) 27 (!) 23 (!) 21 (!) 23  Temp:  (!) 96.6 F (35.9 C)  (!) 97.5 F (36.4 C)  TempSrc:  Axillary  Oral  SpO2: 98% 91% 99% 98%  Weight:      Height:        Intake/Output Summary (Last 24 hours) at 03/23/17 0855 Last data filed at 03/23/17 0500  Gross per 24 hour  Intake          1026.25 ml  Output              975 ml  Net            51.25 ml   Filed Weights   03/20/17 0150  Weight: 170 lb (77.1 kg)    Telemetry    Atrial fibrillation with RVR - Personally Reviewed  ECG    No new EKG to review - Personally Reviewed  Physical Exam   GEN: No acute distress.   Neck: No JVD Cardiac: irregularly irregular, no murmurs, rubs, or gallops.  Respiratory: Clear to auscultation bilaterally. GI: Soft, nontender, non-distended  MS: No edema; No deformity. Neuro:  Nonfocal  Psych: Normal affect   Labs    Chemistry Recent Labs Lab 03/19/17 2010 03/20/17 0404 03/21/17 0226 03/22/17 1345 03/23/17 0428  NA 137 136  138 135 134*  K 3.5 4.1 4.9 4.1 3.9  CL 101 101 106 105 101  CO2 24 24 21* 23 26  GLUCOSE 135* 239* 156* 158* 148*  BUN 21* 21* 36* 25* 22*  CREATININE 0.86 0.98 2.66* 1.07* 0.84  CALCIUM 9.4 8.9 8.2* 7.9* 8.3*  PROT 7.1 6.6  --   --   --   ALBUMIN 4.0 3.5  --   --   --   AST 23 24  --   --   --   ALT 24 22  --   --   --   ALKPHOS 74 61  --   --   --   BILITOT 0.8 1.0  --   --   --   GFRNONAA 60* 51* 15* 47* >60  GFRAA >60 59* 18* 55* >60  ANIONGAP Hematology Recent Labs Lab 03/22/17 0804 03/22/17 1700 03/23/17 0428  WBC 19.6* 20.7* 23.0*  RBC 2.84* 2.75* 2.81*  HGB 8.5* 8.4* 8.5*  HCT 25.6* 24.9* 25.6*  MCV 90.1 90.5 91.1  MCH 29.9 30.5  30.2  MCHC 33.2 33.7 33.2  RDW 15.5 15.7* 15.4  PLT 222 230 253    Cardiac EnzymesNo results for input(s): TROPONINI in the last 168 hours. No results for input(s): TROPIPOC in the last 168 hours.   BNPNo results for input(s): BNP, PROBNP in the last 168 hours.   DDimer No results for input(s): DDIMER in the last 168 hours.   Radiology    Ct Abdomen Pelvis Wo Contrast  Result Date: 03/22/2017 CLINICAL DATA:  Abdominal distention.  Pelvic fractures after fall. EXAM: CT ABDOMEN AND PELVIS WITHOUT CONTRAST TECHNIQUE: Multidetector CT imaging of the abdomen and pelvis was performed following the standard protocol without IV contrast. COMPARISON:  Pelvic CT 03/20/2017 FINDINGS: Lower chest: Cardiomegaly with coronary arteriosclerosis and aortic atherosclerosis. Small right pleural effusion with adjacent atelectasis. Trace left pleural effusion. Reflux of contrast is noted within the distal esophagus associated with a small hiatal hernia. Hepatobiliary: No space-occupying mass of the liver. No liver laceration is apparent. No biliary dilatation. Gallbladder is distended without wall thickening or calculus possibly due to a fasting state. Pancreas: Normal appearance of the pancreas without inflammation or ductal dilatation. No  space-occupying mass. Spleen: Normal spleen without mass. No evidence of splenic laceration or subcapsular fluid. Adrenals/Urinary Tract: Normal bilateral adrenal glands and kidneys. No obstructive uropathy. The urinary bladder is decompressed by Foley catheter and slightly deviated to the left secondary to the known extraperitoneal hematoma on the right. Stomach/Bowel: Moderate-to-marked gastric distention with enteric contrast. There is normal small bowel rotation. Contrast and fluid-filled distention of small bowel is identified with moderate colonic stool burden noted. No mechanical source obstruction is apparent. Findings may reflect an an ileus or possibly small bowel dysmotility. Vascular/Lymphatic: Slightly smaller right extraperitoneal hematoma now estimated at 9.9 x 7.2 cm on axial images acquired at the same level as prior. Previously this measured 12 x 8 cm. Moderate aortoiliac and branch vessel atherosclerosis. Reproductive: Status post hysterectomy.  Pelvic floor laxity. Other: No pneumoperitoneum or intraperitoneal fluid. Musculoskeletal: Fractures of the left obturator ring in the mid inferior pubic ramus at the puboacetabular junction, left sacral ale are fracture extending to the sacroiliac joint inferiorly, segmental fracturing of the right inferior pubic ramus and single fracture of the right superior pubic ramus are again noted without evidence of pelvic diastasis or proximal hip fractures. Left L5 transverse process fractures noted. There is lower lumbar degenerative disc and facet arthropathy with grade 1 anterolisthesis of L4 on L5. IMPRESSION: 1. Contrast and fluid distention of small bowel loops without mechanical source of obstruction identified. Findings likely represent an ileus or small bowel dysmotility. 2. Moderate-to-marked retention of enteric contrast noted within the stomach with small hiatal hernia. This likely contributes to the contrast seen in the distal esophagus from reflux.  3. Chronic acute pelvic fractures as above. 4. Slightly smaller right pelvic sidewall extraperitoneal hematoma. Currently this measures 9.9 x 7.2 cm versus 12 x 8 cm on axial images. 5. Small right pleural effusion. 6. Cardiomegaly with coronary arteriosclerosis. Electronically Signed   By: Tollie Eth M.D.   On: 03/22/2017 16:55   Dg Pelvis Comp Min 3v  Result Date: 03/21/2017 CLINICAL DATA:  Fall, pelvic fracture EXAM: JUDET PELVIS - 3+ VIEW COMPARISON:  CT pelvis dated 03/20/2017 FINDINGS: Bilateral pelvic ring fractures, better evaluated on CT. Known left sacral fracture is not well visualized due to overlying bowel gas. IMPRESSION: Bilateral pelvic ring fractures, better evaluated on CT. Known left sacral fracture is not well visualized due to  overlying bowel gas. Electronically Signed   By: Charline BillsSriyesh  Krishnan M.D.   On: 03/21/2017 09:49   Dg Chest Port 1 View  Result Date: 03/22/2017 CLINICAL DATA:  Decreased breath sounds. History of diabetes, CHF, atrial fibrillation, hypertension, recent fall with multiple pelvic fractures EXAM: PORTABLE CHEST 1 VIEW COMPARISON:  Portable chest x-ray of March 19, 2017 FINDINGS: The right lung remains mildly hypoinflated. The left lung is better inflated. The cardiac silhouette is enlarged. The pulmonary vascularity is normal. There is no definite pleural effusion. There is calcification in the wall of the aortic arch. IMPRESSION: Persistent mild hypoinflation on the right. Stable cardiomegaly. No definite pneumonia nor CHF. Electronically Signed   By: David  SwazilandJordan M.D.   On: 03/22/2017 08:42   Dg Abd Portable 1v  Result Date: 03/22/2017 CLINICAL DATA:  Clinical constipation EXAM: PORTABLE ABDOMEN - 1 VIEW COMPARISON:  AP pelvis radiographs and pelvic CT scan of September 9th 2018 FINDINGS: There are loops of moderately distended gas-filled small and large bowel present there is a moderate amount of stool in the right colon and some within the descending  colon. There is no definite rectal gas. No free extraluminal gas collections are observed. Gas in the left upper quadrant likely lies within the stomach but distention of the splenic flexure region could be present. Bilateral pelvic ring displaced fractures are again demonstrated. IMPRESSION: Gaseous distention of small and large bowel loops may reflect ileus or possible distal colonic obstructive process though I favor the former. No definite perforation. Abdominal and pelvic CT scanning would be a useful next imaging step. Electronically Signed   By: David  [place] M.D.   On: 03/22/2017 08:40    Cardiac Studies   2D echo 05/2016 Study Conclusions  - Left ventricle: The cavity size was normal. Wall thickness was   normal. Systolic function was vigorous. The estimated ejection   fraction was in the range of 65% to 70%. Wall motion was normal;   there were no regional wall motion abnormalities. Features are   consistent with a pseudonormal left ventricular filling pattern,   with concomitant abnormal relaxation and increased filling   pressure (grade 2 diastolic dysfunction). - Aortic valve: There was mild stenosis. - Mitral valve: Calcified annulus. Mildly calcified leaflets .   There was mild to moderate regurgitation. - Left atrium: The atrium was severely dilated. - Tricuspid valve: There was moderate regurgitation. - Pulmonary arteries: PA peak pressure: 44 mm Hg (S).  Nuclear stress test 02/2017 Study Highlights     Nuclear stress EF: 60%.  The left ventricular ejection fraction is normal (55-65%).  There was no ST segment deviation noted during stress.  The study is normal.  This is a low risk study.   Low risk stress nuclear study with normal perfusion and normal left ventricular regional and global systolic function. Atrial fibrillation during the study.      Patient Profile     83 y.o. female with ahistory of hypertension, diabetes and permanent atrial  fibrillation. She had a myocardial perfusion scan done that showed an EF of around 60%. [facility] hypertensive crisis and EF was reported to be low there however EF in 2017 was 60-65%. She recently had been seen and her diltiazem had been increased because of increased ventricular response. He had a negative myocardial perfusion scan with an EF of around 60% on August 4. She had a mechanical fall and was admitted with a pelvic fracture with  a question of active bleeding noted on CT scan. She has become hypotensive and required fluid boluses. Her hemoglobin dropped from 12.3-8.9.   Assessment & Plan    1. Persistent atrial fibrillation - HR improved yesterday after IV Amio bolus and restarting IV Cardizem gtt.  HR currently elevated as PT is working with her - continue IV Amio gtt and give a  bolus.   - continue Cardizem gtt and titrate to keep HR < 100.  2. Hypotension - this has resolved with blood products and volume resuscitation. SBP now elevated in the 140-160's. - will increase Cardizem to /hr  3. Chronic diastolic CHF with hypertensive heart disease - recent CHF exacerbation 2 months ago at [facility] in setting of hypertensive urgency - she appears euvolemic on exam today  - 2D echo a year ago was normal but then apparently repeated at [facility] and EF 45-50% recently but followup nuclear stress test showed normal LVF.   4. Mild dementia by history  5. Aortic atherosclerosis and coronary artery calcifications on chest CT -  No ischemia on nuclear stress test  6.  Moderate pulmonary HTN on echo 01/2017 at [facility] with PASP .    For questions or updates, please contact [facility] Please consult [email or web address] for contact info under Cardiology/STEMI.      Signed, Armanda Magic, MD  03/23/2017, 8:55 AM

## 2017-03-24 ENCOUNTER — Inpatient Hospital Stay (HOSPITAL_COMMUNITY): Payer: Medicare Other

## 2017-03-24 ENCOUNTER — Encounter (HOSPITAL_COMMUNITY)
Admission: EM | Disposition: A | Discharge status: Skilled Nursing Facility | Payer: Self-pay | Source: Home / Self Care | Attending: Internal Medicine

## 2017-03-24 LAB — CBC WITH DIFFERENTIAL/PLATELET
Basophils Absolute: 0 10*3/uL (ref 0.0–0.1)
Basophils Relative: 0 %
EOS PCT: 0 %
Eosinophils Absolute: 0 10*3/uL (ref 0.0–0.7)
HCT: 25.1 % — ABNORMAL LOW (ref 36.0–46.0)
Hemoglobin: 8.1 g/dL — ABNORMAL LOW (ref 12.0–15.0)
LYMPHS ABS: 0.9 10*3/uL (ref 0.7–4.0)
LYMPHS PCT: 5 %
MCH: 30 pg (ref 26.0–34.0)
MCHC: 32.3 g/dL (ref 30.0–36.0)
MCV: 93 fL (ref 78.0–100.0)
MONO ABS: 2.4 10*3/uL — AB (ref 0.1–1.0)
MONOS PCT: 14 %
Neutro Abs: 14.2 10*3/uL — ABNORMAL HIGH (ref 1.7–7.7)
Neutrophils Relative %: 81 %
PLATELETS: 312 10*3/uL (ref 150–400)
RBC: 2.7 MIL/uL — AB (ref 3.87–5.11)
RDW: 15.5 % (ref 11.5–15.5)
WBC: 17.5 10*3/uL — ABNORMAL HIGH (ref 4.0–10.5)

## 2017-03-24 LAB — BLOOD GAS, ARTERIAL
ACID-BASE EXCESS: 2.4 mmol/L — AB (ref 0.0–2.0)
BICARBONATE: 26.5 mmol/L (ref 20.0–28.0)
Drawn by: 36529
O2 CONTENT: 4 L/min
O2 SAT: 95 %
PCO2 ART: 41.7 mmHg (ref 32.0–48.0)
PH ART: 7.419 (ref 7.350–7.450)
Patient temperature: 98.6
pO2, Arterial: 71.7 mmHg — ABNORMAL LOW (ref 83.0–108.0)

## 2017-03-24 LAB — GLUCOSE, CAPILLARY
GLUCOSE-CAPILLARY: 141 mg/dL — AB (ref 65–99)
GLUCOSE-CAPILLARY: 123 mg/dL — AB (ref 65–99)
GLUCOSE-CAPILLARY: 146 mg/dL — AB (ref 65–99)
Glucose-Capillary: 128 mg/dL — ABNORMAL HIGH (ref 65–99)
Glucose-Capillary: 136 mg/dL — ABNORMAL HIGH (ref 65–99)
Glucose-Capillary: 144 mg/dL — ABNORMAL HIGH (ref 65–99)

## 2017-03-24 LAB — COMPREHENSIVE METABOLIC PANEL
ALT: 22 U/L (ref 14–54)
AST: 26 U/L (ref 15–41)
Albumin: 2.9 g/dL — ABNORMAL LOW (ref 3.5–5.0)
Alkaline Phosphatase: 53 U/L (ref 38–126)
Anion gap: 8 (ref 5–15)
BUN: 21 mg/dL — AB (ref 6–20)
CHLORIDE: 102 mmol/L (ref 101–111)
CO2: 27 mmol/L (ref 22–32)
CREATININE: 0.78 mg/dL (ref 0.44–1.00)
Calcium: 8.4 mg/dL — ABNORMAL LOW (ref 8.9–10.3)
GFR calc Af Amer: >60 mL/min (ref >60)
GLUCOSE: 152 mg/dL — AB (ref 65–99)
Potassium: 3.9 mmol/L (ref 3.5–5.1)
Sodium: 137 mmol/L (ref 135–145)
Total Bilirubin: 1.4 mg/dL — ABNORMAL HIGH (ref 0.3–1.2)
Total Protein: 5.8 g/dL — ABNORMAL LOW (ref 6.5–8.1)

## 2017-03-24 LAB — BRAIN NATRIURETIC PEPTIDE: B Natriuretic Peptide: 373.3 pg/mL — ABNORMAL HIGH (ref 0.0–100.0)

## 2017-03-24 IMAGING — DX DG CHEST 1V PORT
1 series · 1 of 1 positions shown · non-contrast
Comparison: Portable chest x-ray of 03/23/2017

CLINICAL DATA: Wheezing today

EXAM:
PORTABLE CHEST 1 VIEW

[chest ap]
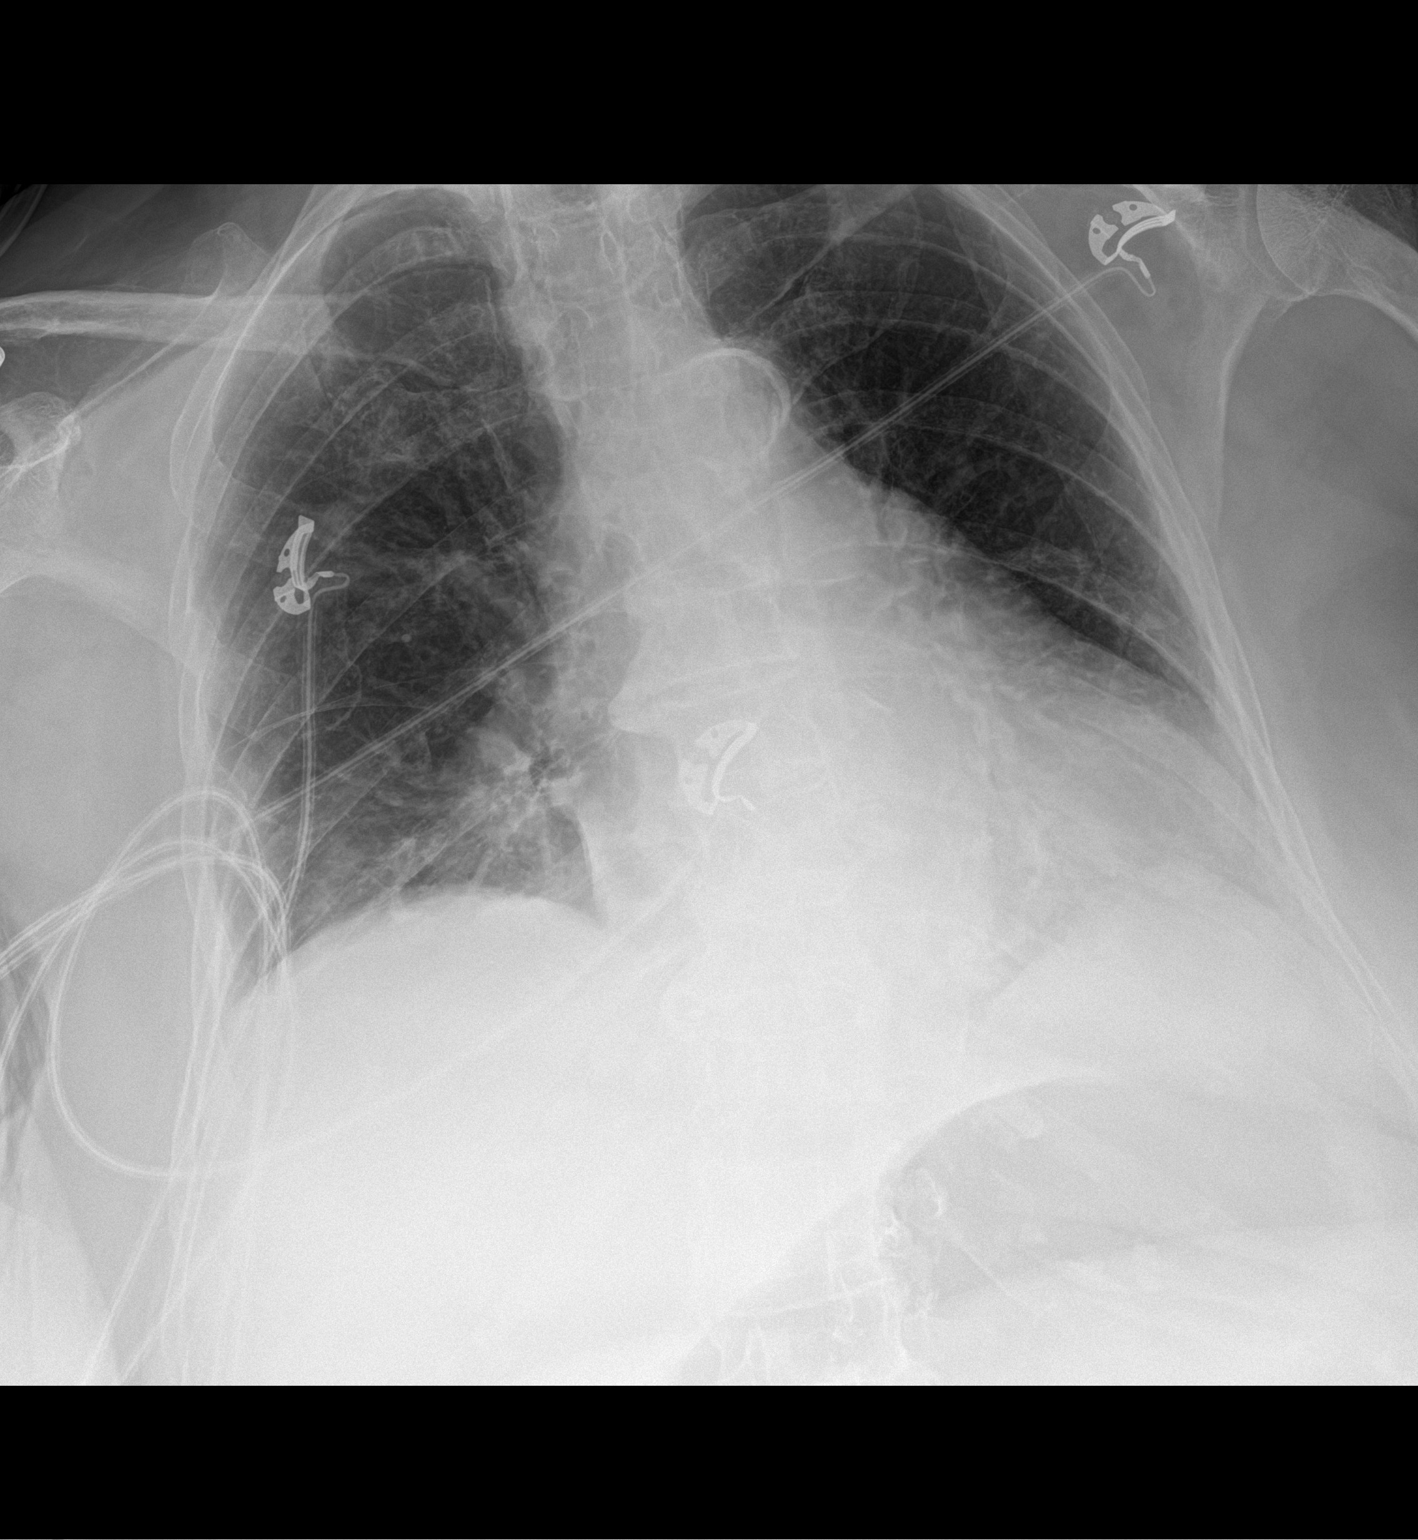

[1 of 1 positions shown; findings below may reference images not displayed]

FINDINGS: Mild bibasilar atelectasis remains with suboptimal inspiration. No
pneumonia or effusion is seen. Mediastinal and hilar contours are
unchanged and mild cardiomegaly stable. Moderate thoracic aortic
atherosclerosis is noted.
IMPRESSION: 1. Suboptimal inspiration with mild bibasilar atelectasis.
2. Stable mild cardiomegaly.

## 2017-03-24 IMAGING — DX DG CHEST 1V PORT
1 series · 1 of 1 positions shown · non-contrast
Comparison: 03/24/2017

CLINICAL DATA: Confirm line placement, Modus II research study.
Head 45 degrees and arm 30 to 35 degrees.

EXAM:
PORTABLE CHEST 1 VIEW

[chest ap]
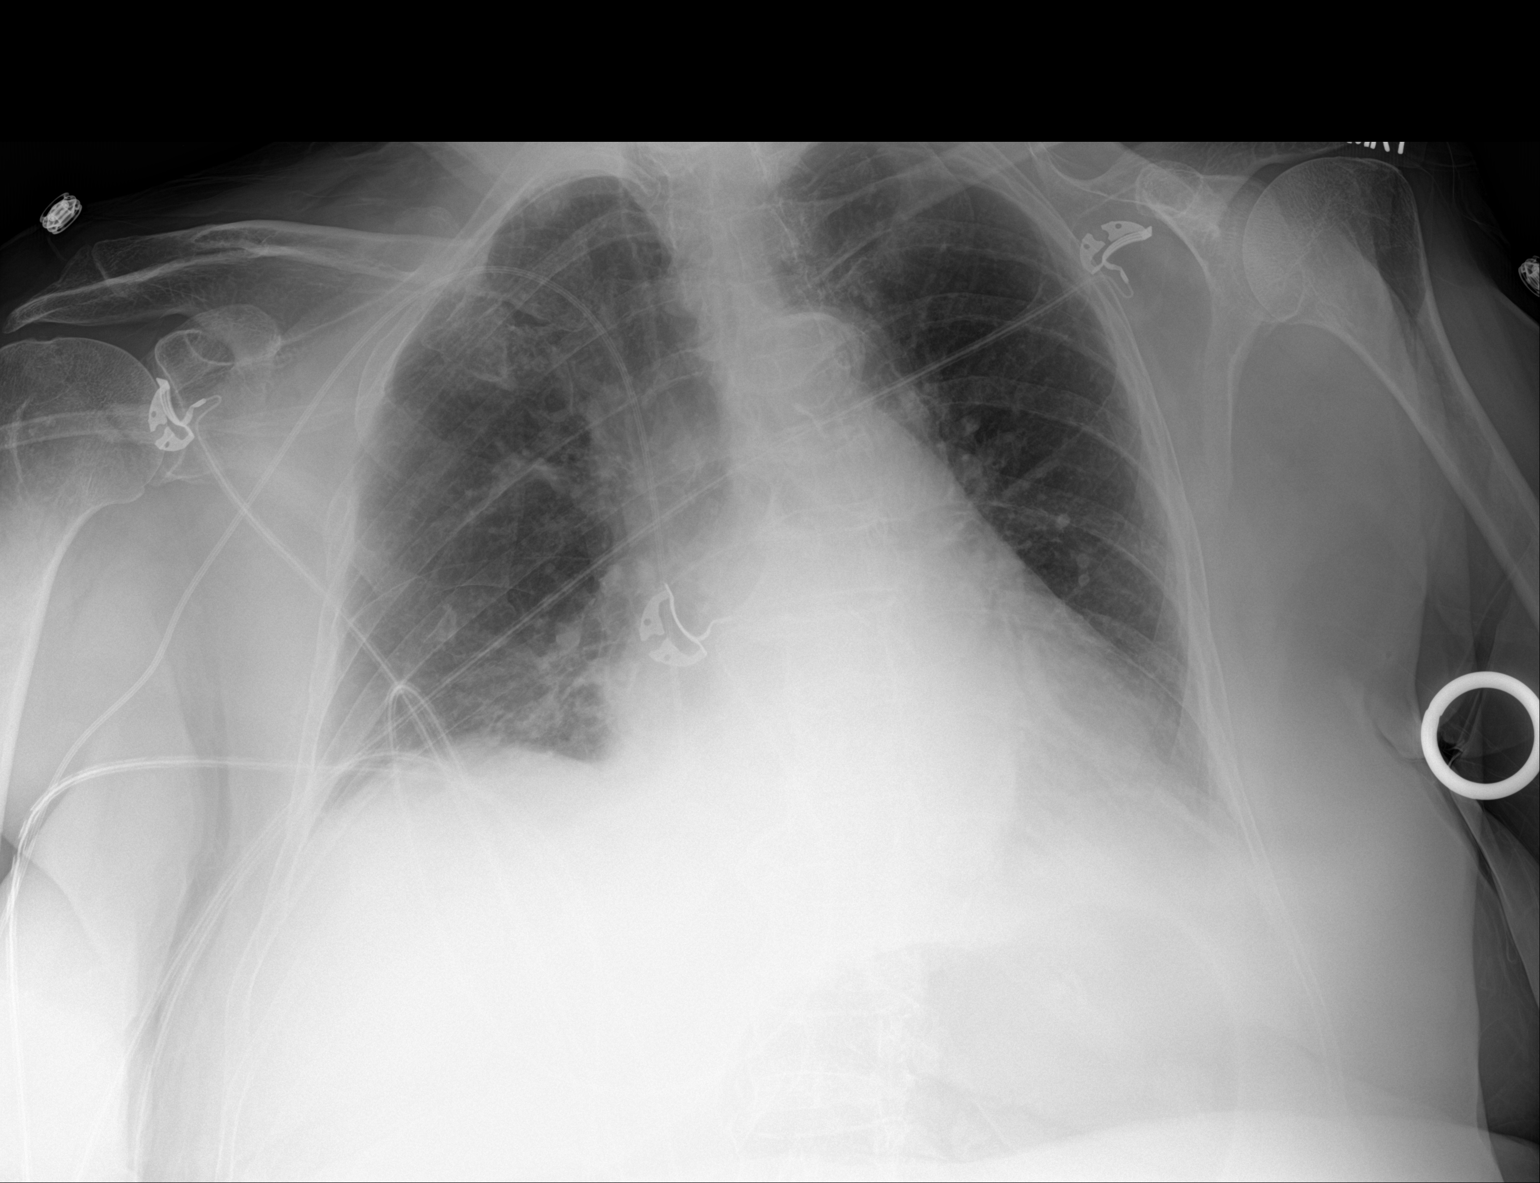

[1 of 1 positions shown; findings below may reference images not displayed]

FINDINGS: Patient has right-sided PICC line catheter, tip overlying the level
of the right atrium. Consider withdrawing catheter approximately 2
cm into the lower superior vena cava. No pneumothorax. Shallow lung
inflation. Calibration ring overlies the left axillary region.
IMPRESSION: Right-sided PICC line tip overlying the level of the right atrium.

## 2017-03-24 IMAGING — CT CT HEAD W/O CM
4 series · 16 of 47 positions shown, 18 images · non-contrast
Comparison: None.

CLINICAL DATA: Altered level of consciousness. History of
hypertension and diabetes.

EXAM:
CT HEAD WITHOUT CONTRAST
TECHNIQUE: Contiguous axial images were obtained from the base of the skull
through the vertex without intravenous contrast.

[Series 3: head wo · axial · 0.41mm/px · z∈[+1002,+1132]mm · 7 of 36 slices shown, 9 images]
[im 5/36  brain]
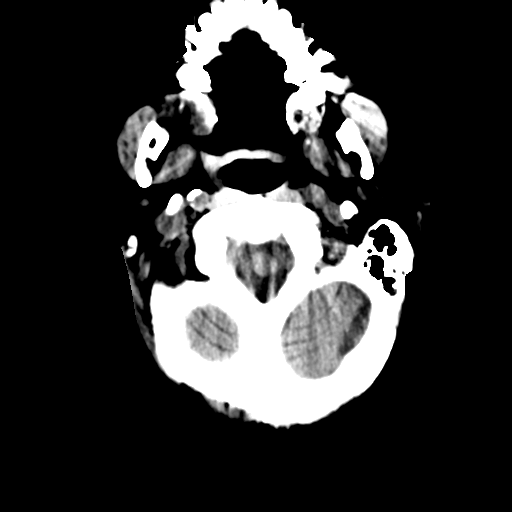
[im 5/36  bone]
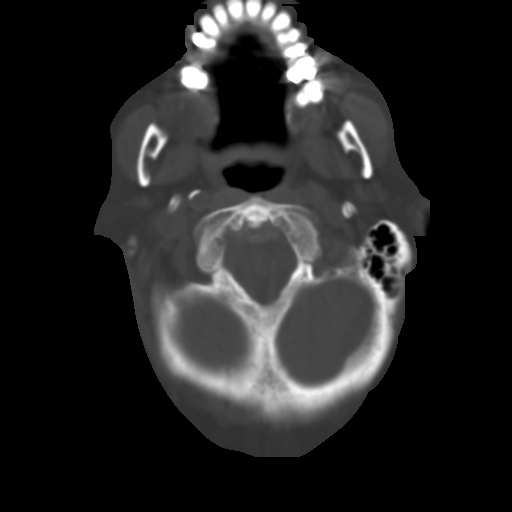
[im 9/36  brain]
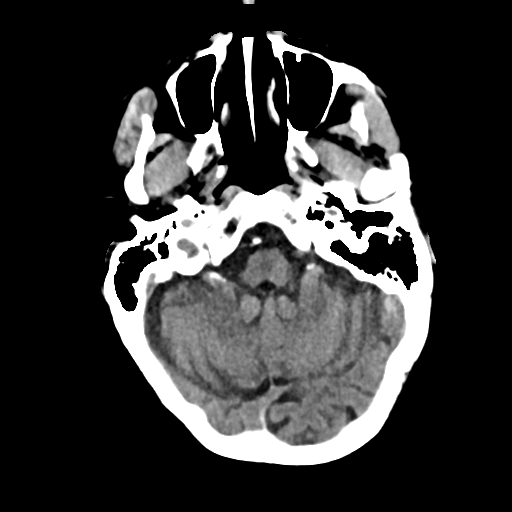
[im 14/36  brain]
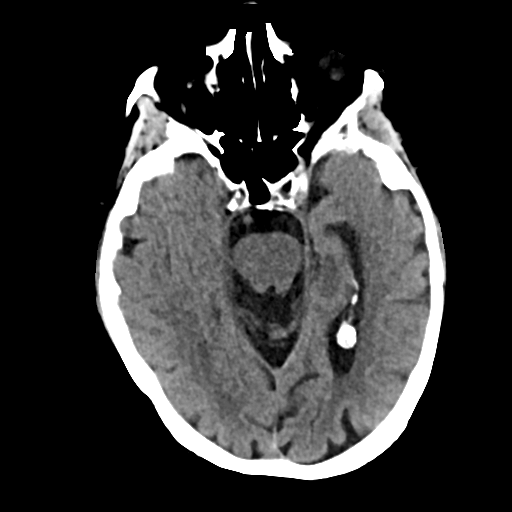
[im 18/36  brain]
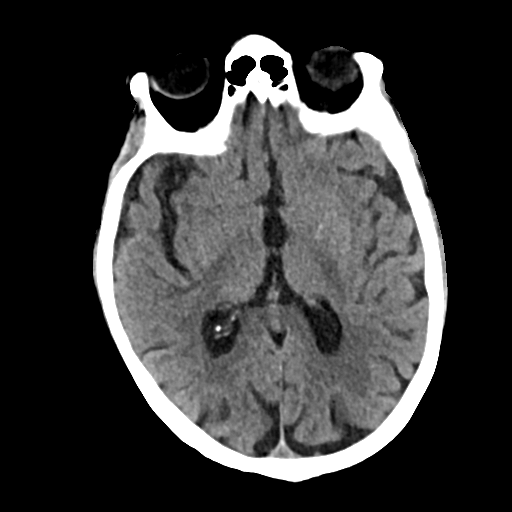
[im 22/36  brain]
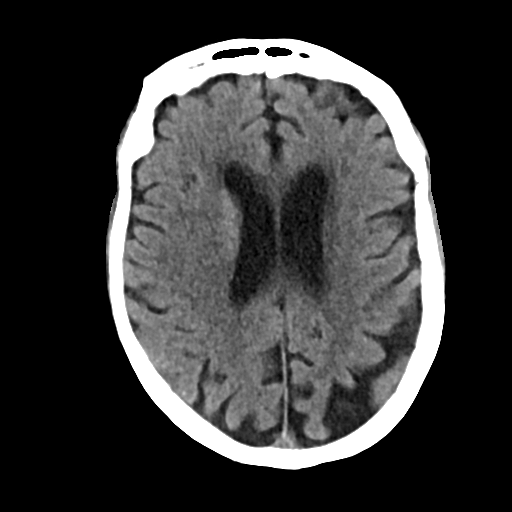
[im 22/36  bone]
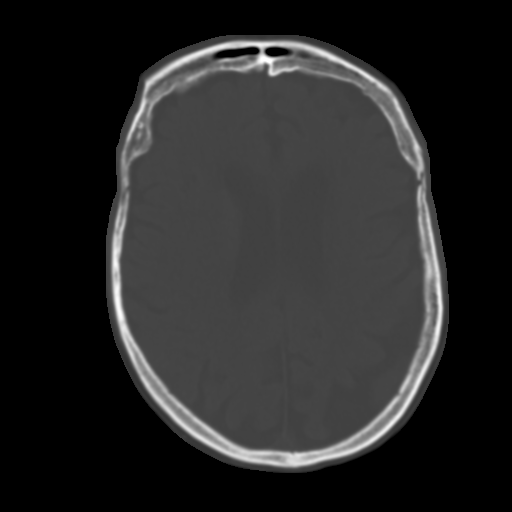
[im 27/36  brain]
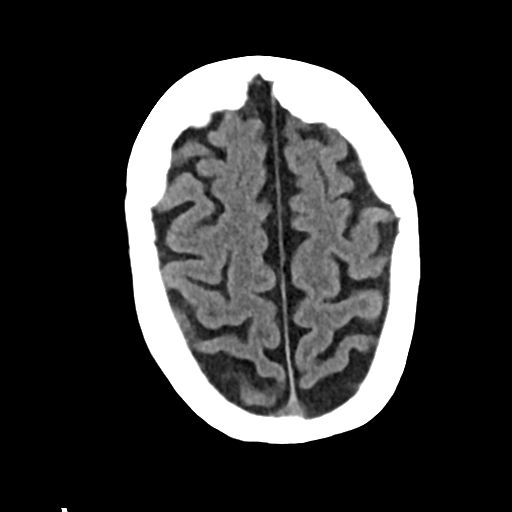
[im 31/36  brain]
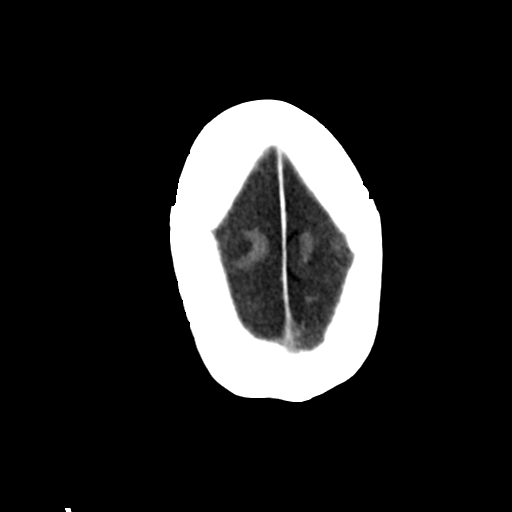

[Series 4: head bone · axial · 0.41mm/px · z∈[+998,+1034]mm · 3 of 88 slices shown]
[im 9/88  bone]
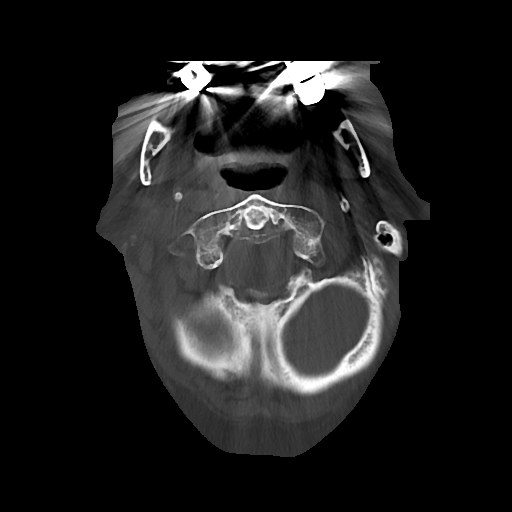
[im 18/88  bone]
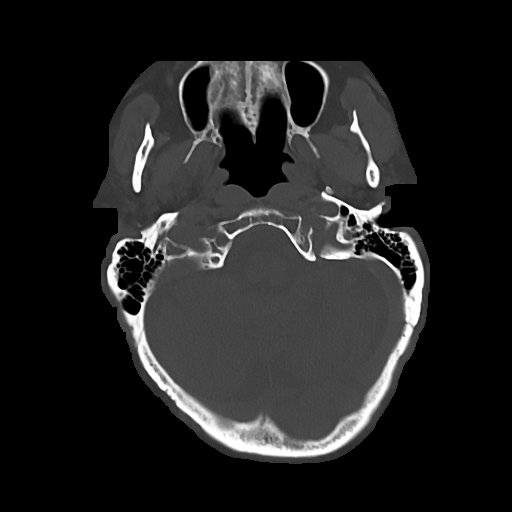
[im 27/88  bone]
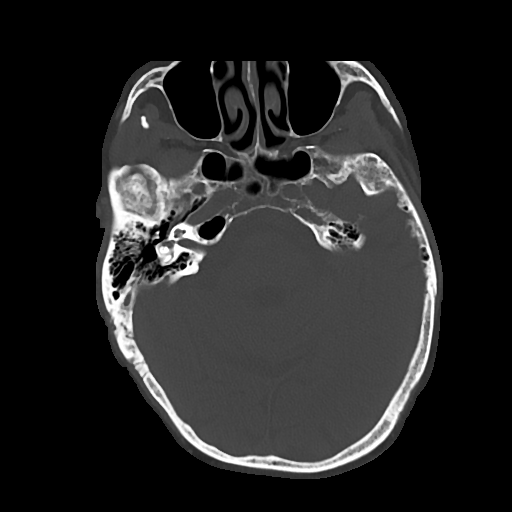

[Series 5: cor soft · coronal · 0.36mm/px · 3 of 71 slices shown]
[im 24/71  brain]
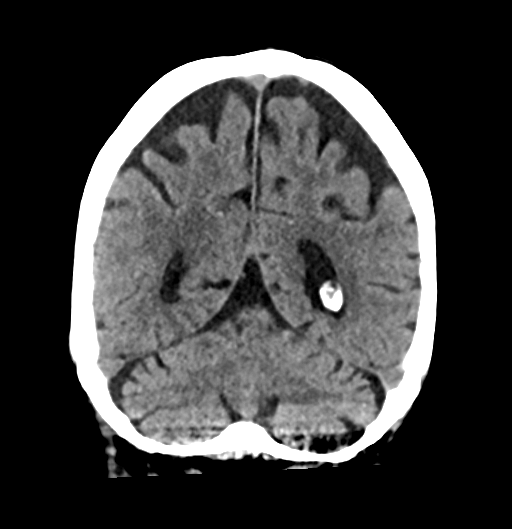
[im 32/71  brain]
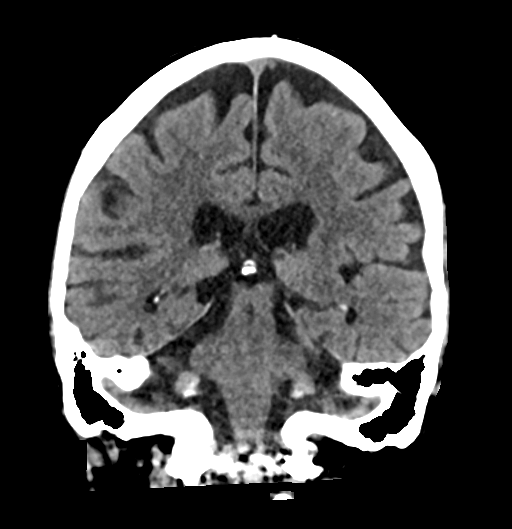
[im 39/71  brain]
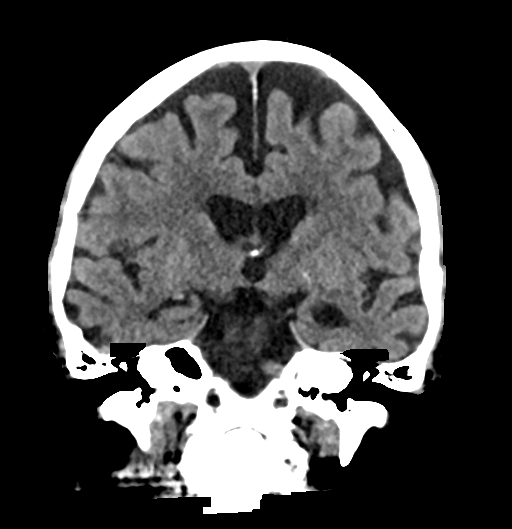

[Series 6: sag soft · sagittal · 0.36mm/px · 3 of 62 slices shown]
[im 21/62  brain]
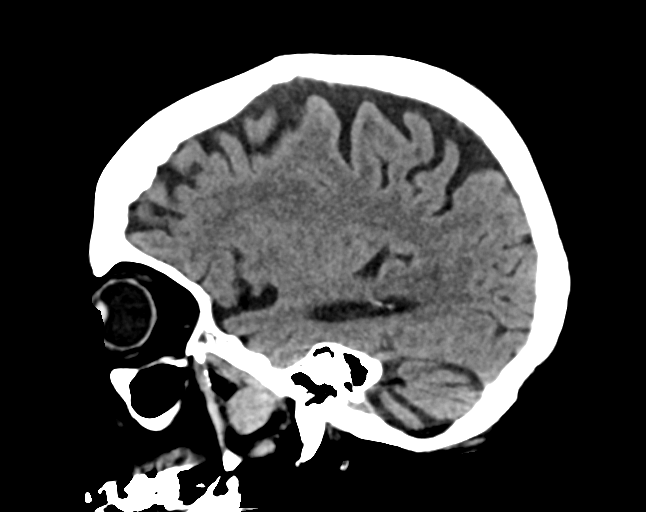
[im 31/62  brain]
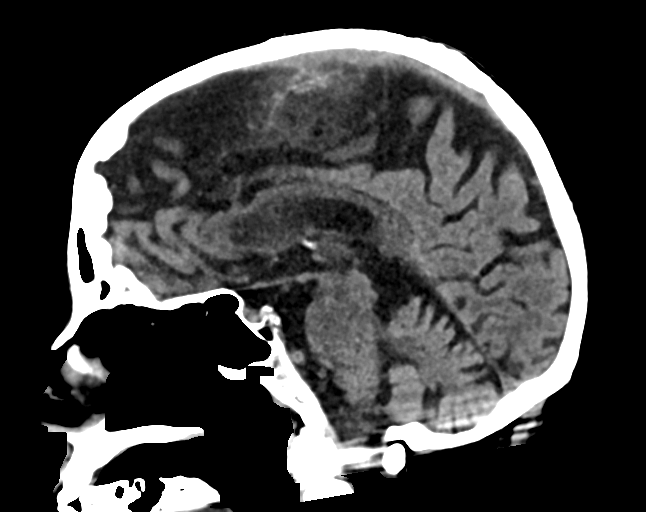
[im 41/62  brain]
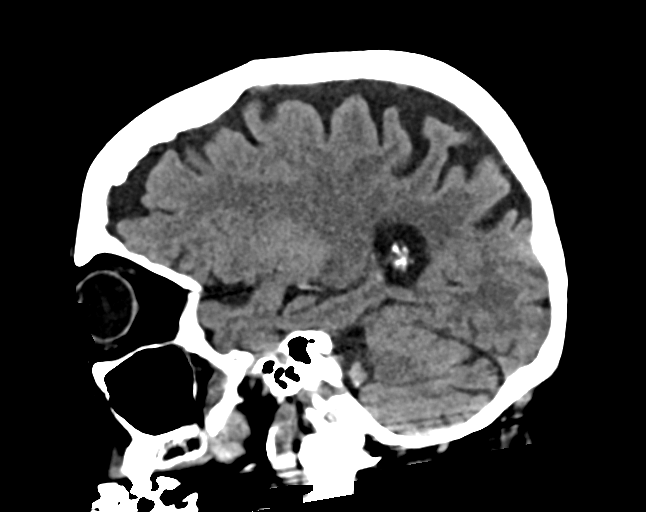

[16 of 47 positions shown; findings below may reference images not displayed]

FINDINGS: BRAIN: No intraparenchymal hemorrhage, mass effect nor midline
shift. The ventricles and sulci are normal for age. Patchy
supratentorial white matter hypodensities less than expected for
patient's age, though non-specific are most compatible with chronic
small vessel ischemic disease. No acute large vascular territory
infarcts. No abnormal extra-axial fluid collections. Basal cisterns
are patent.

VASCULAR: Moderate calcific atherosclerosis of the carotid siphons.

SKULL: No skull fracture. Severe temporomandibular osteoarthrosis.
Subcentimeter lucent lesion LEFT C2 body, incompletely evaluated. No
significant scalp soft tissue swelling.

SINUSES/ORBITS: The mastoid air-cells and included paranasal sinuses
are well-aerated.The included ocular globes and orbital contents are
non-suspicious. Status post RIGHT ocular lens implant.

OTHER: None.
IMPRESSION: 1. Negative noncontrast CT HEAD for age.
2. Partially imaged subcentimeter lucency C2 would be better
characterized on dedicated CT if clinically indicated.

## 2017-03-24 IMAGING — DX DG ABD PORTABLE 1V
1 series · 1 of 1 positions shown · non-contrast
Comparison: 03/23/2017

CLINICAL DATA: Ileus

EXAM:
PORTABLE ABDOMEN - 1 VIEW

[abdomen kub]
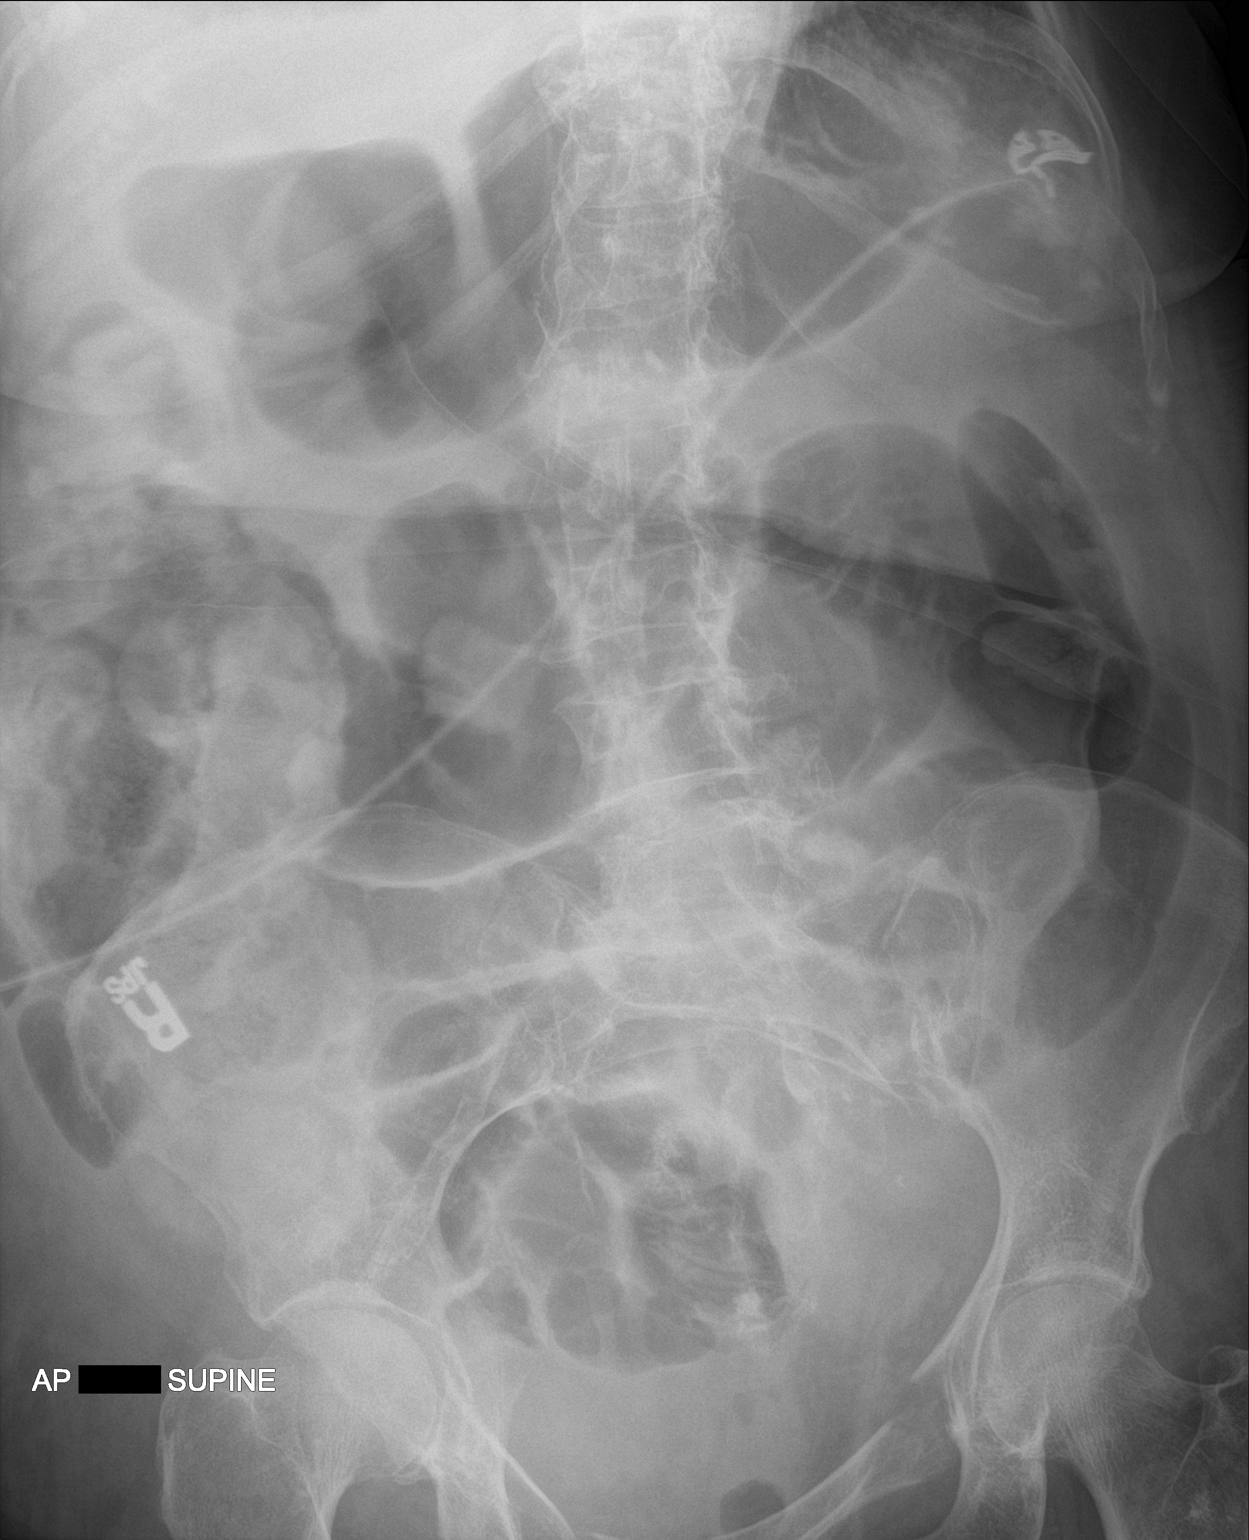

[1 of 1 positions shown; findings below may reference images not displayed]

FINDINGS: Gaseous distention of large and small bowel appears unchanged.
Minimal air in the rectum.

Bilateral pubic rami fractures noted.
IMPRESSION: Adynamic ileus unchanged.

## 2017-03-24 SURGERY — OPEN REDUCTION INTERNAL FIXATION (ORIF) PELVIC FRACTURE
Anesthesia: General | Laterality: Left

## 2017-03-24 MED ORDER — DIGOXIN 0.25 MG/ML IJ SOLN
0.1250 mg | Freq: Every day | INTRAMUSCULAR | Status: DC
  Administered 2017-03-25 – 2017-03-26 (×2): 0.125 mg via INTRAVENOUS
  Filled 2017-03-24 (×2): qty 2

## 2017-03-24 MED ORDER — AMIODARONE IV BOLUS ONLY 150 MG/100ML
150.0000 mg | Freq: Once | INTRAVENOUS | Status: AC
  Administered 2017-03-24: 150 mg via INTRAVENOUS
  Filled 2017-03-24: qty 100

## 2017-03-24 MED ORDER — APIXABAN 5 MG PO TABS
5.0000 mg | ORAL_TABLET | Freq: Two times a day (BID) | ORAL | Status: DC
  Administered 2017-03-24 – 2017-03-31 (×15): 5 mg via ORAL
  Filled 2017-03-24 (×15): qty 1

## 2017-03-24 MED ORDER — DIGOXIN 0.25 MG/ML IJ SOLN
0.2500 mg | INTRAMUSCULAR | Status: AC
  Administered 2017-03-24 (×2): 0.25 mg via INTRAVENOUS
  Filled 2017-03-24 (×2): qty 2

## 2017-03-24 MED ORDER — METHYLPREDNISOLONE SODIUM SUCC 40 MG IJ SOLR
40.0000 mg | Freq: Four times a day (QID) | INTRAMUSCULAR | Status: DC
  Administered 2017-03-24 – 2017-03-26 (×8): 40 mg via INTRAVENOUS
  Filled 2017-03-24 (×8): qty 1

## 2017-03-24 MED ORDER — PANTOPRAZOLE SODIUM 40 MG PO TBEC
40.0000 mg | DELAYED_RELEASE_TABLET | Freq: Every day | ORAL | Status: DC
  Administered 2017-03-24 – 2017-03-31 (×8): 40 mg via ORAL
  Filled 2017-03-24 (×8): qty 1

## 2017-03-24 MED ORDER — FUROSEMIDE 10 MG/ML IJ SOLN
INTRAMUSCULAR | Status: AC
  Administered 2017-03-24: 08:00:00
  Filled 2017-03-24: qty 2

## 2017-03-24 MED ORDER — SODIUM CHLORIDE 0.9% FLUSH
10.0000 mL | Freq: Two times a day (BID) | INTRAVENOUS | Status: DC
  Administered 2017-03-24 – 2017-03-30 (×12): 10 mL

## 2017-03-24 MED ORDER — SODIUM CHLORIDE 0.9% FLUSH
10.0000 mL | INTRAVENOUS | Status: DC | PRN
  Administered 2017-03-24: 10 mL
  Administered 2017-03-31: 20 mL
  Filled 2017-03-24 (×2): qty 40

## 2017-03-24 MED ORDER — FUROSEMIDE 10 MG/ML IJ SOLN
20.0000 mg | Freq: Once | INTRAMUSCULAR | Status: AC
  Administered 2017-03-24: 20 mg via INTRAVENOUS

## 2017-03-24 MED ORDER — CHLORHEXIDINE GLUCONATE CLOTH 2 % EX PADS
6.0000 | MEDICATED_PAD | Freq: Every day | CUTANEOUS | Status: DC
  Administered 2017-03-25 – 2017-03-29 (×3): 6 via TOPICAL

## 2017-03-24 NOTE — Progress Notes (Signed)
Progress Note  Patient Name: Tracey Porter Date of Encounter: 03/24/2017  Primary Cardiologist: Dr. Tresa Endo  Subjective   HR still elevated.  Ortho has cleared her to restart Eliquis.  No surgery planned.  Daughter says that she has been very confused and pulled out her IVs overnight.   Inpatient Medications    Scheduled Meds: . acyclovir  800 mg Oral BID  . apixaban  5 mg Oral BID  . atorvastatin  40 mg Oral Daily  . insulin aspart  0-9 Units Subcutaneous Q4H  . methylPREDNISolone (SOLU-MEDROL) injection  40 mg Intravenous Q6H  . mirabegron ER  25 mg Oral Daily  . pantoprazole  40 mg Oral Daily  . prednisoLONE acetate  1 drop Right Eye BID  . senna  1 tablet Oral BID   Continuous Infusions: . sodium chloride 75 mL/hr at 03/23/17 2132  . amiodarone 30 mg/hr (03/24/17 0237)  . diltiazem (CARDIZEM) infusion 15 mg/hr (03/24/17 0524)   PRN Meds: albuterol, LORazepam, ondansetron **OR** ondansetron (ZOFRAN) IV   Vital Signs    Vitals:   03/24/17 0430 03/24/17 0528 03/24/17 0757 03/24/17 0800  BP: (!) 123/58  116/78 (!) 117/52  Pulse: (!) 121  (!) 112 (!) 35  Resp: (!) 27  (!) 27 (!) 24  Temp:  98.5 F (36.9 C)  98.6 F (37 C)  TempSrc:  Oral  Oral  SpO2: 94%  96% 98%  Weight:      Height:        Intake/Output Summary (Last 24 hours) at 03/24/17 1049 Last data filed at 03/24/17 1028  Gross per 24 hour  Intake          3025.28 ml  Output             3825 ml  Net          -799.72 ml   Filed Weights   03/20/17 0150  Weight: 170 lb (77.1 kg)    Telemetry    Atrial fibrillation with RVR - Personally Reviewed  ECG    Atrial fibrillation at 122bpm with nonspecific ST abnormality - Personally Reviewed  Physical Exam   GEN: No acute distress but delerious earlier this am Neck: No JVD Cardiac: irregularly irregular, no murmurs, rubs, or gallops.  Respiratory: diffuse wheezing throughout lung fields GI: Soft, nontender, non-distended  MS: No edema; No  deformity. Neuro:  Nonfocal  Psych: Normal affect   Labs    Chemistry Recent Labs Lab 03/19/17 2010 03/20/17 0404  03/22/17 1345 03/23/17 0428 03/24/17 0338  NA 137 136  < > 135 134* 137  K 3.5 4.1  < > 4.1 3.9 3.9  CL 101 101  < > 105 101 102  CO2 24 24  < > GLUCOSE 135* 239*  < > 158* 148* 152*  BUN 21* 21*  < > 25* 22* 21*  CREATININE 0.86 0.98  < > 1.07* 0.84 0.78  CALCIUM 9.4 8.9  < > 7.9* 8.3* 8.4*  PROT 7.1 6.6  --   --   --  5.8*  ALBUMIN 4.0 3.5  --   --   --  2.9*  AST 23 24  --   --   --  26  ALT 24 22  --   --   --  22  ALKPHOS 74 61  --   --   --  53  BILITOT 0.8 1.0  --   --   --  1.4*  GFRNONAA 60* 51*  < > 47* >60 >60  GFRAA >60 59*  < > 55* >60 >60  ANIONGAP 12 11  < > < > = values in this interval not displayed.   Hematology Recent Labs Lab 03/22/17 1700 03/23/17 0428 03/24/17 0338  WBC 20.7* 23.0* 17.5*  RBC 2.75* 2.81* 2.70*  HGB 8.4* 8.5* 8.1*  HCT 24.9* 25.6* 25.1*  MCV 90.5 91.1 93.0  MCH 30.5 30.2 30.0  MCHC 33.7 33.2 32.3  RDW 15.7* 15.4 15.5  PLT 230 253 312    Cardiac EnzymesNo results for input(s): TROPONINI in the last 168 hours. No results for input(s): TROPIPOC in the last 168 hours.   BNPNo results for input(s): BNP, PROBNP in the last 168 hours.   DDimer No results for input(s): DDIMER in the last 168 hours.   Radiology    Ct Abdomen Pelvis Wo Contrast  Result Date: 03/22/2017 CLINICAL DATA:  Abdominal distention.  Pelvic fractures after fall. EXAM: CT ABDOMEN AND PELVIS WITHOUT CONTRAST TECHNIQUE: Multidetector CT imaging of the abdomen and pelvis was performed following the standard protocol without IV contrast. COMPARISON:  Pelvic CT 03/20/2017 FINDINGS: Lower chest: Cardiomegaly with coronary arteriosclerosis and aortic atherosclerosis. Small right pleural effusion with adjacent atelectasis. Trace left pleural effusion. Reflux of contrast is noted within the distal esophagus associated with a small  hiatal hernia. Hepatobiliary: No space-occupying mass of the liver. No liver laceration is apparent. No biliary dilatation. Gallbladder is distended without wall thickening or calculus possibly due to a fasting state. Pancreas: Normal appearance of the pancreas without inflammation or ductal dilatation. No space-occupying mass. Spleen: Normal spleen without mass. No evidence of splenic laceration or subcapsular fluid. Adrenals/Urinary Tract: Normal bilateral adrenal glands and kidneys. No obstructive uropathy. The urinary bladder is decompressed by Foley catheter and slightly deviated to the left secondary to the known extraperitoneal hematoma on the right. Stomach/Bowel: Moderate-to-marked gastric distention with enteric contrast. There is normal small bowel rotation. Contrast and fluid-filled distention of small bowel is identified with moderate colonic stool burden noted. No mechanical source obstruction is apparent. Findings may reflect an an ileus or possibly small bowel dysmotility. Vascular/Lymphatic: Slightly smaller right extraperitoneal hematoma now estimated at 9.9 x 7.2 cm on axial images acquired at the same level as prior. Previously this measured 12 x 8 cm. Moderate aortoiliac and branch vessel atherosclerosis. Reproductive: Status post hysterectomy.  Pelvic floor laxity. Other: No pneumoperitoneum or intraperitoneal fluid. Musculoskeletal: Fractures of the left obturator ring in the mid inferior pubic ramus at the puboacetabular junction, left sacral ale are fracture extending to the sacroiliac joint inferiorly, segmental fracturing of the right inferior pubic ramus and single fracture of the right superior pubic ramus are again noted without evidence of pelvic diastasis or proximal hip fractures. Left L5 transverse process fractures noted. There is lower lumbar degenerative disc and facet arthropathy with grade 1 anterolisthesis of L4 on L5. IMPRESSION: 1. Contrast and fluid distention of small  bowel loops without mechanical source of obstruction identified. Findings likely represent an ileus or small bowel dysmotility. 2. Moderate-to-marked retention of enteric contrast noted within the stomach with small hiatal hernia. This likely contributes to the contrast seen in the distal esophagus from reflux. 3. Chronic acute pelvic fractures as above. 4. Slightly smaller right pelvic sidewall extraperitoneal hematoma. Currently this measures 9.9 x 7.2 cm versus 12 x 8 cm on axial images. 5. Small right pleural effusion. 6. Cardiomegaly with coronary arteriosclerosis. Electronically Signed  By: Tollie Ethavid  Kwon M.D.   On: 03/22/2017 16:55   Dg Pelvis Comp Min 3v  Result Date: 03/23/2017 CLINICAL DATA:  Pelvic fractures. EXAM: JUDET PELVIS - 3+ VIEW COMPARISON:  CT abdomen and pelvis from yesterday. FINDINGS: Again seen are mild inferiorly displaced fractures of the left superior and inferior pubic rami. Nondisplaced fractures of the right superior and inferior pubic rami. The pubic symphysis is normal. No pelvic diastasis. The sacrum is obscured by bowel gas. Degenerative changes of the bilateral hip joints. Osteopenia. IMPRESSION: 1. Unchanged mild inferiorly displaced fractures of the left superior and inferior pubic rami. 2. Unchanged nondisplaced fractures of the right superior and inferior pubic rami. Electronically Signed   By: Obie DredgeWilliam T Derry M.D.   On: 03/23/2017 13:15   Nm Pulmonary Perf And Vent  Result Date: 03/23/2017 CLINICAL DATA:  Multiple pelvic fractures status post mechanical fall complicated by pelvic hematoma. EXAM: NUCLEAR MEDICINE VENTILATION - PERFUSION LUNG SCAN TECHNIQUE: Ventilation images were obtained in multiple projections using inhaled aerosol Tc-4652m DTPA. Perfusion images were obtained in multiple projections after intravenous injection of Tc-5052m MAA. RADIOPHARMACEUTICALS:  30.4 mCi Technetium-552m DTPA aerosol inhalation and 4.36 mCi Technetium-4552m MAA IV COMPARISON:  CXR from  earlier on the same day FINDINGS: Ventilation: Central air trapping of inhaled aerosolized material. Perfusion: No wedge shaped peripheral perfusion defects to suggest acute pulmonary embolism. IMPRESSION: No moderate or large perfusion-ventilation mismatches. Findings in keeping with very low probability for pulmonary embolus. Electronically Signed   By: Tollie Ethavid  Kwon M.D.   On: 03/23/2017 21:42   Dg Chest Port 1 View  Result Date: 03/23/2017 CLINICAL DATA:  Shortness of breath. EXAM: PORTABLE CHEST 1 VIEW COMPARISON:  March 22, 2017. FINDINGS: Stable cardiomegaly. Normal pulmonary vascularity. Persistent low lung volumes and bibasilar atelectasis. No pleural effusion or pneumothorax. IMPRESSION: Stable cardiomegaly and low lung volumes with bibasilar atelectasis. Electronically Signed   By: Obie DredgeWilliam T Derry M.D.   On: 03/23/2017 13:10   Dg Abd Portable 1v  Result Date: 03/24/2017 CLINICAL DATA:  Ileus EXAM: PORTABLE ABDOMEN - 1 VIEW COMPARISON:  03/23/2017 FINDINGS: Gaseous distention of large and small bowel appears unchanged. Minimal air in the rectum. Bilateral pubic rami fractures noted. IMPRESSION: Adynamic ileus unchanged. Electronically Signed   By: Marlan Palauharles  Clark M.D.   On: 03/24/2017 07:37   Dg Abd Portable 1v  Result Date: 03/23/2017 CLINICAL DATA:  Abdominal pain. EXAM: PORTABLE ABDOMEN - 1 VIEW COMPARISON:  CT abdomen pelvis from yesterday. FINDINGS: Multiple mildly dilated loops of air-filled small bowel are again seen. Stool is noted in the right colon. Bilateral pubic rami fractures. IMPRESSION: Findings suggestive of ileus. Electronically Signed   By: Obie DredgeWilliam T Derry M.D.   On: 03/23/2017 13:17    Cardiac Studies   2D echo 05/2016 Study Conclusions  - Left ventricle: The cavity size was normal. Wall thickness was normal. Systolic function was vigorous. The estimated ejection fraction was in the range of 65% to 70%. Wall motion was normal; there were no regional wall  motion abnormalities. Features are consistent with a pseudonormal left ventricular filling pattern, with concomitant abnormal relaxation and increased filling pressure (grade 2 diastolic dysfunction). - Aortic valve: There was mild stenosis. - Mitral valve: Calcified annulus. Mildly calcified leaflets . There was mild to moderate regurgitation. - Left atrium: The atrium was severely dilated. - Tricuspid valve: There was moderate regurgitation. - Pulmonary arteries: PA peak pressure: 44 mm Hg (S).  Nuclear stress test 02/2017 Study Highlights     Nuclear stress  EF: 60%.  The left ventricular ejection fraction is normal (55-65%).  There was no ST segment deviation noted during stress.  The study is normal.  This is a low risk study.  Low risk stress nuclear study with normal perfusion and normal left ventricular regional and global systolic function. Atrial fibrillation during the study.      Patient Profile     83 y.o. female with ahistory of hypertension, diabetes and permanent atrial fibrillation. She had a myocardial perfusion scan done that showed an EF of around 60%. She had an admission at [facility] for a hypertensive crisis and EF was reported to be low there however EF in 2017 was 60-65%. She recently had been seen and her diltiazem had been increased because of increased ventricular response. He had a negative myocardial perfusion scan with an EF of around 60% on August 4. She had a mechanical fall and was admitted with a pelvic fracture with a question of active bleeding noted on CT scan. She has become hypotensive and required fluid boluses. Her hemoglobin dropped from 12.3-8.9.   Assessment & Plan    1. Persistent atrial fibrillation - HR improved but still fast - will continue Amio IV for now since she has an ileus and likely will have inadequate absorption of PO meds.  - continue Cardizem gtt at /hr - avoid BB for now due to  wheezing - will give another dose of Amio  IV - add Digoxin 0.25mg  IV q4 hours x 2 doses and then 0.125mg  IV daily  2. Hypotension- this has resolved with blood products and volume resuscitation. SBP now 110-140's. - continue IV Cardizem to /hr for now as she is not taking PO due to ileus  3. Chronic diastolic CHF with hypertensive heart disease - recent CHF exacerbation 2 months ago at [facility] in setting of hypertensive urgency - she is actively wheezing on exam today.  Cxray yesterday with no edema. Will repeat xray this am - she got one dose of IV lasix this am - check BNP - 2D echo a year ago was normal but then apparently repeated at [facility] and EF 45-50% recently but followup nuclear stress test showed normal LVF.   4. Mild dementiaby history  5. Aortic atherosclerosis and coronary artery calcifications on chest CT -  recent nuclear stress test showed no ischemia and normal LVF  6. Moderate pulmonary HTN on echo 01/2017 at [facility] with PASP .   For questions or updates, please contact [facility] Please consult [email or web address] for contact info under Cardiology/STEMI.      Signed, Armanda Magic, MD  03/24/2017, 10:49 AM

## 2017-03-24 NOTE — Progress Notes (Signed)
Peripherally Inserted Central Catheter/Midline Placement  The IV Nurse has discussed with the patient and/or persons authorized to consent for the patient, the purpose of this procedure and the potential benefits and risks involved with this procedure.  The benefits include less needle sticks, lab draws from the catheter, and the patient may be discharged home with the catheter. Risks include, but not limited to, infection, bleeding, blood clot (thrombus formation), and puncture of an artery; nerve damage and irregular heartbeat and possibility to perform a PICC exchange if needed/ordered by physician.  Alternatives to this procedure were also discussed.  Bard Power PICC patient education guide, fact sheet on infection prevention and patient information card has been provided to patient /or left at bedside.    PICC/Midline Placement Documentation     Daughter signed consent at bedside   Tracey GreenlandLumban, Tracey Porter 03/24/2017, 3:03 PM

## 2017-03-24 NOTE — Care Management Important Message (Signed)
Important Message  Patient Details  Name: Tracey Porter MRN: [id number] Date of Birth: 10/21/1929   Medicare Important Message Given:  Yes    Sheyla Zaffino Abena 03/24/2017, 9:20 AM

## 2017-03-24 NOTE — Progress Notes (Signed)
OT Cancellation Note  Patient Details Name: Tracey Porter MRN: [id number] DOB: 01/08/1930   Cancelled Treatment:    Reason Eval/Treat Not Completed: Patient at procedure or test/ unavailable, Pt currently in a sterile procedure.   Evern BioLaura J Izaias Krupka 03/24/2017, 2:52 PM  Sherryl MangesLaura Rontrell Moquin OTR/L [phone]

## 2017-03-24 NOTE — Progress Notes (Signed)
PT Cancellation Note  Patient Details Name: Tracey Porter MRN: [id number] DOB: 05/10/1930   Cancelled Treatment:    Reason Eval/Treat Not Completed: Patient not medically ready, currently sterile procedure ongoing   Fabio AsaDevon J Cherisa Brucker 03/24/2017, 2:50 PM Charlotte Crumbevon Ronica Vivian, PT DPT  Board Certified Neurologic Specialist [phone]

## 2017-03-24 NOTE — Progress Notes (Signed)
Message to Dr Molli HazardMatthew about transfer patient to CT and continuing wheezing.

## 2017-03-24 NOTE — Progress Notes (Signed)
Ok for patient to go down to CT without the nurse per DR Ashley RoyaltyMatthews.

## 2017-03-24 NOTE — Plan of Care (Signed)
Problem: Safety: Goal: Ability to remain free from injury will improve Outcome: Not Progressing Pt has episodic agitation during which she reports that she is hot.  During this time she pulls at IV lines and cardiac monitoring equipment as well as attempting to remove her gown.  Pt has pulled out 2 IV's this shift.  A new line was inserted by this RN. Pt placed in posey mittens at this time to attempt to stop her from being able to remove further lines or interfere with medical equipment. Will continue to monitor.

## 2017-03-24 NOTE — Progress Notes (Signed)
Orthopaedic Trauma Progress Note  S: Had a difficult night. HR still elevated. She has been confused and currently wheezing. Daughter is at bedside  O:  Vitals:   03/24/17 0757 03/24/17 0800  BP: 116/78 (!) 117/52  Pulse: (!) 112 (!) 35  Resp: (!) 27 (!) 24  Temp:  98.6 F (37 C)  SpO2: 96% 98%  Audible wheezing Confused Neuro intact in BLE, no pelvic stress or exam this AM  Imaging: No new imaging from orthopaedic standpoint  Labs: Hgb 8.1  A/P: 83 yo female with pelvic ring injury  -Plan nonoperative treatment -Continue to mobilize with physical therapy as able -Pulmonary and heart issues per hospitalist and cardiologist -May start eliquis from orthopaedic standpoint -Will need follow up in my clinic in 1-2 weeks for repeat pelvic x-rays  Roby LoftsKevin P. Haddix, MD [facility] [phone] (phone)

## 2017-03-24 NOTE — Progress Notes (Signed)
PROGRESS NOTE    Tracey Porter  WUJ:[id number] DOB: October 07, 1929 DOA: 03/19/2017 PCP: [place], Julie M, NP    Brief Narrative: 83 year old female admitted 03/19/2017 with status post mechanical fall. Patient was found to have multiple fractures of the pelvis and pelvic ring. Patient has history of atrial fibrillation on Cardizem and amiodarone as at this time.noted digoxin added. Her ejection fraction is 65%. CT scan of the pelvis showed evidence of hemorrhage. Patient receivedK centra and blood transfusions. Today when I saw the patient she was complaining of shortness of breath increased wheezing and tachypneic and tachycardic. Nurse reported that after the nebulizer treatment her wheezing got worse. She did receive a dose lasix.   Assessment & Plan:   Principal Problem:   Multiple fractures of pelvis with unstable disruption of pelvic ring, initial encounter for closed fracture (HCC) Active Problems:   DM2 (diabetes mellitus, type 2) (HCC)   Paroxysmal SVT (supraventricular tachycardia) (HCC)   Essential hypertension   Closed pelvic fracture (HCC)   Leukocytosis   Fall at home, initial encounter   Atrial fibrillation with RVR (HCC)   Aortic atherosclerosis (HCC)   Chronic atrial fibrillation (HCC)   Hypotension due to blood loss   Coagulopathy (HCC)   Chronic diastolic heart failure (HCC)  Rapid Afib on cardizem,dig,amio.will order a tsh.restart eliquis.  Ileus - avoid narcotics.clear liquids only.kub in am.  Multiple closed pelvic fractures  - ortho to treat non surgically.  Leukocytosis improving.no evidence of infection found.  COPD exacerbation start solumedrol.continue svns.  Change in mental status/mild dementia/delirium- abg no evidence of hypercapnea.titrate oxygen down to keep her sats above 92.    DVT prophylaxis: eliquis Code Statusfull Family Communication: daughter Disposition Plan: tbd   Consultants cardiology  Procedures: picc  Antimicrobials:  none   Subjective: confused  Objective: Vitals:   03/24/17 0528 03/24/17 0757 03/24/17 0800 03/24/17 1123  BP:  116/78 (!) 117/52 118/62  Pulse:  (!) 112 (!) 35   Resp:  (!) 27 (!) 24   Temp: 98.5 F (36.9 C)  98.6 F (37 C) 98.2 F (36.8 C)  TempSrc: Oral  Oral Oral  SpO2:  96% 98%   Weight:      Height:        Intake/Output Summary (Last 24 hours) at 03/24/17 1416 Last data filed at 03/24/17 1028  Gross per 24 hour  Intake          3025.28 ml  Output             2975 ml  Net            50.28 ml   Filed Weights   03/20/17 0150  Weight: 77.1 kg (170 lb)    Examination:  General exam: Appears calm and comfortable  Respiratory system: wheezing B/L. Respiratory effort normal. Cardiovascular system: S1 & S2 heard, RRR. No JVD, murmurs, rubs, gallops or clicks. No pedal edema. Gastrointestinal system: Abdomen is nondistended, soft and nontender. No organomegaly or masses felt. Normal bowel sounds heard. Central nervous system: Alert and oriented. No focal neurological deficits. Extremities: Symmetric 5 x 5 power. Skin: No rashes, lesions or ulcers Psychiatry: Judgement and insight appear normal. Mood & affect appropriate.     Data Reviewed: I have personally reviewed following labs and imaging studies  CBC:  Recent Labs Lab 03/22/17 0523 03/22/17 0804 03/22/17 1700 03/23/17 0428 03/24/17 0338  WBC 18.6* 19.6* 20.7* 23.0* 17.5*  NEUTROABS 15.7* 17.1* 17.5* 19.6* 14.2*  HGB 8.2* 8.5* 8.4* 8.5* 8.1*  HCT 24.7* 25.6* 24.9* 25.6* 25.1*  MCV 90.5 90.1 90.5 91.1 93.0  PLT 212 222 230 253 312   Basic Metabolic Panel:  Recent Labs Lab 03/20/17 0404 03/21/17 0226 03/22/17 1345 03/23/17 0428 03/24/17 0338  NA 136 138 135 134* 137  K 4.1 4.9 4.1 3.9 3.9  CL 101 106 105 101 102  CO2 24 21* GLUCOSE 239* 156* 158* 148* 152*  BUN 21* 36* 25* 22* 21*  CREATININE 0.98 2.66* 1.07* 0.84 0.78  CALCIUM 8.9 8.2* 7.9* 8.3* 8.4*  MG 1.5*  --   --   --    --   PHOS 4.5  --   --   --   --    GFR: Estimated Creatinine Clearance: 53.9 mL/min (by C-G formula based on SCr of 0.78 mg/dL). Liver Function Tests:  Recent Labs Lab 03/19/17 2010 03/20/17 0404 03/24/17 0338  AST ALT ALKPHOS 74 61 53  BILITOT 0.8 1.0 1.4*  PROT 7.1 6.6 5.8*  ALBUMIN 4.0 3.5 2.9*   No results for input(s): LIPASE, AMYLASE in the last 168 hours. No results for input(s): AMMONIA in the last 168 hours. Coagulation Profile:  Recent Labs Lab 03/19/17 2010  INR 1.22   Cardiac Enzymes: No results for input(s): CKTOTAL, CKMB, CKMBINDEX, TROPONINI in the last 168 hours. BNP (last 3 results) No results for input(s): PROBNP in the last 8760 hours. HbA1C: No results for input(s): HGBA1C in the last 72 hours. CBG:  Recent Labs Lab 03/23/17 1959 03/24/17 0000 03/24/17 0342 03/24/17 0818 03/24/17 1121  GLUCAP 125* 119* 141* 123* 128*   Lipid Profile: No results for input(s): CHOL, HDL, LDLCALC, TRIG, CHOLHDL, LDLDIRECT in the last 72 hours. Thyroid Function Tests: No results for input(s): TSH, T4TOTAL, FREET4, T3FREE, THYROIDAB in the last 72 hours. Anemia Panel: No results for input(s): VITAMINB12, FOLATE, FERRITIN, TIBC, IRON, RETICCTPCT in the last 72 hours. Sepsis Labs: No results for input(s): PROCALCITON, LATICACIDVEN in the last 168 hours.  Recent Results (from the past 240 hour(s))  Surgical pcr screen     Status: None   Collection Time: 03/20/17  1:50 AM  Result Value Ref Range Status   MRSA, PCR NEGATIVE NEGATIVE Final   Staphylococcus aureus NEGATIVE NEGATIVE Final    Comment: (NOTE) The Xpert SA Assay (FDA approved for NASAL specimens in patients 86 years of age and older), is one component of a comprehensive surveillance program. It is not intended to diagnose infection nor to guide or monitor treatment.          Radiology Studies: Ct Abdomen Pelvis Wo Contrast  Result Date: 03/22/2017 CLINICAL DATA:   Abdominal distention.  Pelvic fractures after fall. EXAM: CT ABDOMEN AND PELVIS WITHOUT CONTRAST TECHNIQUE: Multidetector CT imaging of the abdomen and pelvis was performed following the standard protocol without IV contrast. COMPARISON:  Pelvic CT 03/20/2017 FINDINGS: Lower chest: Cardiomegaly with coronary arteriosclerosis and aortic atherosclerosis. Small right pleural effusion with adjacent atelectasis. Trace left pleural effusion. Reflux of contrast is noted within the distal esophagus associated with a small hiatal hernia. Hepatobiliary: No space-occupying mass of the liver. No liver laceration is apparent. No biliary dilatation. Gallbladder is distended without wall thickening or calculus possibly due to a fasting state. Pancreas: Normal appearance of the pancreas without inflammation or ductal dilatation. No space-occupying mass. Spleen: Normal spleen without mass. No evidence of splenic laceration or subcapsular fluid. Adrenals/Urinary Tract: Normal bilateral adrenal glands and kidneys.  No obstructive uropathy. The urinary bladder is decompressed by Foley catheter and slightly deviated to the left secondary to the known extraperitoneal hematoma on the right. Stomach/Bowel: Moderate-to-marked gastric distention with enteric contrast. There is normal small bowel rotation. Contrast and fluid-filled distention of small bowel is identified with moderate colonic stool burden noted. No mechanical source obstruction is apparent. Findings may reflect an an ileus or possibly small bowel dysmotility. Vascular/Lymphatic: Slightly smaller right extraperitoneal hematoma now estimated at 9.9 x 7.2 cm on axial images acquired at the same level as prior. Previously this measured 12 x 8 cm. Moderate aortoiliac and branch vessel atherosclerosis. Reproductive: Status post hysterectomy.  Pelvic floor laxity. Other: No pneumoperitoneum or intraperitoneal fluid. Musculoskeletal: Fractures of the left obturator ring in the mid  inferior pubic ramus at the puboacetabular junction, left sacral ale are fracture extending to the sacroiliac joint inferiorly, segmental fracturing of the right inferior pubic ramus and single fracture of the right superior pubic ramus are again noted without evidence of pelvic diastasis or proximal hip fractures. Left L5 transverse process fractures noted. There is lower lumbar degenerative disc and facet arthropathy with grade 1 anterolisthesis of L4 on L5. IMPRESSION: 1. Contrast and fluid distention of small bowel loops without mechanical source of obstruction identified. Findings likely represent an ileus or small bowel dysmotility. 2. Moderate-to-marked retention of enteric contrast noted within the stomach with small hiatal hernia. This likely contributes to the contrast seen in the distal esophagus from reflux. 3. Chronic acute pelvic fractures as above. 4. Slightly smaller right pelvic sidewall extraperitoneal hematoma. Currently this measures 9.9 x 7.2 cm versus 12 x 8 cm on axial images. 5. Small right pleural effusion. 6. Cardiomegaly with coronary arteriosclerosis. Electronically Signed   By: Tollie Ethavid  Kwon M.D.   On: 03/22/2017 16:55   Dg Pelvis Comp Min 3v  Result Date: 03/23/2017 CLINICAL DATA:  Pelvic fractures. EXAM: JUDET PELVIS - 3+ VIEW COMPARISON:  CT abdomen and pelvis from yesterday. FINDINGS: Again seen are mild inferiorly displaced fractures of the left superior and inferior pubic rami. Nondisplaced fractures of the right superior and inferior pubic rami. The pubic symphysis is normal. No pelvic diastasis. The sacrum is obscured by bowel gas. Degenerative changes of the bilateral hip joints. Osteopenia. IMPRESSION: 1. Unchanged mild inferiorly displaced fractures of the left superior and inferior pubic rami. 2. Unchanged nondisplaced fractures of the right superior and inferior pubic rami. Electronically Signed   By: Obie DredgeWilliam T Derry M.D.   On: 03/23/2017 13:15   Nm Pulmonary Perf And  Vent  Result Date: 03/23/2017 CLINICAL DATA:  Multiple pelvic fractures status post mechanical fall complicated by pelvic hematoma. EXAM: NUCLEAR MEDICINE VENTILATION - PERFUSION LUNG SCAN TECHNIQUE: Ventilation images were obtained in multiple projections using inhaled aerosol Tc-4384m DTPA. Perfusion images were obtained in multiple projections after intravenous injection of Tc-984m MAA. RADIOPHARMACEUTICALS:  30.4 mCi Technetium-10984m DTPA aerosol inhalation and 4.36 mCi Technetium-184m MAA IV COMPARISON:  CXR from earlier on the same day FINDINGS: Ventilation: Central air trapping of inhaled aerosolized material. Perfusion: No wedge shaped peripheral perfusion defects to suggest acute pulmonary embolism. IMPRESSION: No moderate or large perfusion-ventilation mismatches. Findings in keeping with very low probability for pulmonary embolus. Electronically Signed   By: Tollie Ethavid  Kwon M.D.   On: 03/23/2017 21:42   Dg Chest Port 1 View  Result Date: 03/24/2017 CLINICAL DATA:  Wheezing today EXAM: PORTABLE CHEST 1 VIEW COMPARISON:  Portable chest x-ray of 03/23/2017 FINDINGS: Mild bibasilar atelectasis remains with suboptimal inspiration. No  pneumonia or effusion is seen. Mediastinal and hilar contours are unchanged and mild cardiomegaly stable. Moderate thoracic aortic atherosclerosis is noted. IMPRESSION: 1. Suboptimal inspiration with mild bibasilar atelectasis. 2. Stable mild cardiomegaly. Electronically Signed   By: Dwyane Dee M.D.   On: 03/24/2017 11:57   Dg Chest Port 1 View  Result Date: 03/23/2017 CLINICAL DATA:  Shortness of breath. EXAM: PORTABLE CHEST 1 VIEW COMPARISON:  March 22, 2017. FINDINGS: Stable cardiomegaly. Normal pulmonary vascularity. Persistent low lung volumes and bibasilar atelectasis. No pleural effusion or pneumothorax. IMPRESSION: Stable cardiomegaly and low lung volumes with bibasilar atelectasis. Electronically Signed   By: Obie Dredge M.D.   On: 03/23/2017 13:10   Dg Abd  Portable 1v  Result Date: 03/24/2017 CLINICAL DATA:  Ileus EXAM: PORTABLE ABDOMEN - 1 VIEW COMPARISON:  03/23/2017 FINDINGS: Gaseous distention of large and small bowel appears unchanged. Minimal air in the rectum. Bilateral pubic rami fractures noted. IMPRESSION: Adynamic ileus unchanged. Electronically Signed   By: Marlan Palau M.D.   On: 03/24/2017 07:37   Dg Abd Portable 1v  Result Date: 03/23/2017 CLINICAL DATA:  Abdominal pain. EXAM: PORTABLE ABDOMEN - 1 VIEW COMPARISON:  CT abdomen pelvis from yesterday. FINDINGS: Multiple mildly dilated loops of air-filled small bowel are again seen. Stool is noted in the right colon. Bilateral pubic rami fractures. IMPRESSION: Findings suggestive of ileus. Electronically Signed   By: Obie Dredge M.D.   On: 03/23/2017 13:17        Scheduled Meds: . acyclovir  800 mg Oral BID  . apixaban  5 mg Oral BID  . atorvastatin  40 mg Oral Daily  . [START ON 03/25/2017] digoxin  0.125 mg Intravenous Daily  . digoxin  0.25 mg Intravenous Q4H  . insulin aspart  0-9 Units Subcutaneous Q4H  . methylPREDNISolone (SOLU-MEDROL) injection  40 mg Intravenous Q6H  . mirabegron ER  25 mg Oral Daily  . pantoprazole  40 mg Oral Daily  . prednisoLONE acetate  1 drop Right Eye BID  . senna  1 tablet Oral BID   Continuous Infusions: . sodium chloride 75 mL/hr at 03/24/17 1137  . amiodarone 30 mg/hr (03/24/17 0237)  . diltiazem (CARDIZEM) infusion 15 mg/hr (03/24/17 1200)     LOS: 4 days     Alwyn Ren, MD [facility]   If 7PM-7AM, please contact night-coverage [email or web address] Password [email or web address] 03/24/2017, 2:16 PM

## 2017-03-24 NOTE — Care Management Note (Signed)
Case Management Note  Patient Details  Name: Tracey Porter MRN: [id number] Date of Birth: 11/30/1929  Subjective/Objective:    From home alone, presents with multiple pelvic fx's s/p fall with hemorrhage , leukocytosis, afib, and ileus, conts on amio, Cardizem drip , for repeat cxr today, plan is for SNF.                 Action/Plan: NCM will follow along with CSW for dc needs.  Expected Discharge Date:  03/23/17               Expected Discharge Plan:  Skilled Nursing Facility  In-House Referral:  Clinical Social Work  Discharge planning Services  CM Consult  Post Acute Care Choice:    Choice offered to:     DME Arranged:    DME Agency:     HH Arranged:    HH Agency:     Status of Service:  Completed, signed off  If discussed at [employer] Meetings, dates discussed:    Additional Comments:  [name], [name], RN 03/24/2017, 3:41 PM

## 2017-03-25 ENCOUNTER — Inpatient Hospital Stay (HOSPITAL_COMMUNITY): Payer: Medicare Other

## 2017-03-25 LAB — GLUCOSE, CAPILLARY
GLUCOSE-CAPILLARY: 161 mg/dL — AB (ref 65–99)
Glucose-Capillary: 141 mg/dL — ABNORMAL HIGH (ref 65–99)
Glucose-Capillary: 151 mg/dL — ABNORMAL HIGH (ref 65–99)
Glucose-Capillary: 153 mg/dL — ABNORMAL HIGH (ref 65–99)
Glucose-Capillary: 171 mg/dL — ABNORMAL HIGH (ref 65–99)
Glucose-Capillary: 178 mg/dL — ABNORMAL HIGH (ref 65–99)

## 2017-03-25 LAB — COMPREHENSIVE METABOLIC PANEL
ALK PHOS: 53 U/L (ref 38–126)
ALT: 23 U/L (ref 14–54)
AST: 21 U/L (ref 15–41)
Albumin: 2.7 g/dL — ABNORMAL LOW (ref 3.5–5.0)
Anion gap: 6 (ref 5–15)
BUN: 21 mg/dL — AB (ref 6–20)
CHLORIDE: 103 mmol/L (ref 101–111)
CO2: 28 mmol/L (ref 22–32)
CREATININE: 0.91 mg/dL (ref 0.44–1.00)
Calcium: 8.2 mg/dL — ABNORMAL LOW (ref 8.9–10.3)
GFR, EST NON AFRICAN AMERICAN: 56 mL/min — AB (ref >60)
Glucose, Bld: 156 mg/dL — ABNORMAL HIGH (ref 65–99)
Potassium: 3.9 mmol/L (ref 3.5–5.1)
SODIUM: 137 mmol/L (ref 135–145)
Total Bilirubin: 1.7 mg/dL — ABNORMAL HIGH (ref 0.3–1.2)
Total Protein: 5.7 g/dL — ABNORMAL LOW (ref 6.5–8.1)

## 2017-03-25 LAB — CBC WITH DIFFERENTIAL/PLATELET
BASOS ABS: 0 10*3/uL (ref 0.0–0.1)
Basophils Relative: 0 %
EOS PCT: 0 %
Eosinophils Absolute: 0 10*3/uL (ref 0.0–0.7)
HCT: 23.2 % — ABNORMAL LOW (ref 36.0–46.0)
HEMOGLOBIN: 7.4 g/dL — AB (ref 12.0–15.0)
LYMPHS ABS: 0.5 10*3/uL — AB (ref 0.7–4.0)
LYMPHS PCT: 5 %
MCH: 29.8 pg (ref 26.0–34.0)
MCHC: 31.9 g/dL (ref 30.0–36.0)
MCV: 93.5 fL (ref 78.0–100.0)
Monocytes Absolute: 0.5 10*3/uL (ref 0.1–1.0)
Monocytes Relative: 5 %
NEUTROS PCT: 90 %
Neutro Abs: 9.4 10*3/uL — ABNORMAL HIGH (ref 1.7–7.7)
PLATELETS: 310 10*3/uL (ref 150–400)
RBC: 2.48 MIL/uL — AB (ref 3.87–5.11)
RDW: 15.9 % — ABNORMAL HIGH (ref 11.5–15.5)
WBC: 10.4 10*3/uL (ref 4.0–10.5)

## 2017-03-25 LAB — TSH: TSH: 0.351 u[IU]/mL (ref 0.350–4.500)

## 2017-03-25 IMAGING — DX DG ABD PORTABLE 1V
1 series · 1 of 1 positions shown · non-contrast
Comparison: Portable exam 1499 hours compared to 03/24/2017

CLINICAL DATA: Ileus, history diabetes mellitus, hypertension

EXAM:
PORTABLE ABDOMEN - 1 VIEW

[abdomen kub]
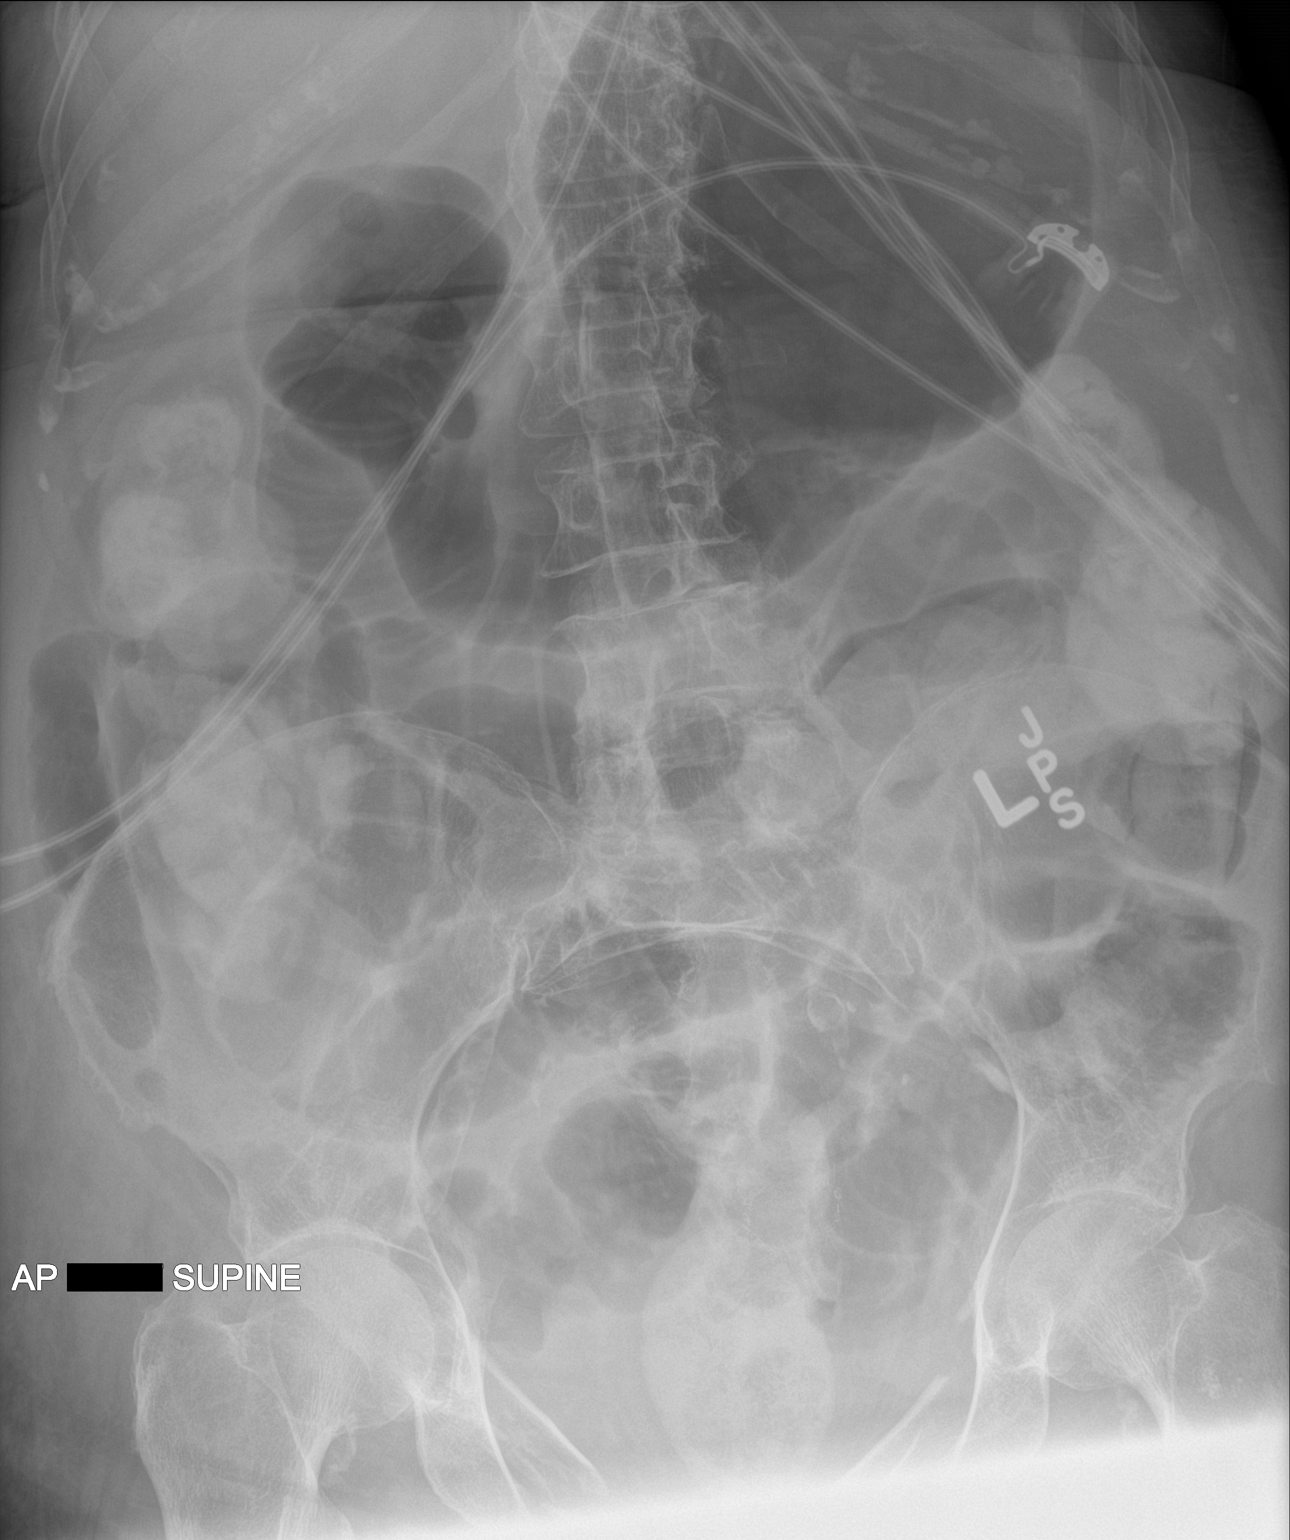

[1 of 1 positions shown; findings below may reference images not displayed]

FINDINGS: Gas distention of stomach.

Diffuse gaseous distention of small bowel loops throughout abdomen.

Scattered gas and stool in colon.

Stool in rectum.

Findings are most consistent with ileus, unchanged.

Bone severely demineralized with again identified LEFT superior
pubic ramus fracture.
IMPRESSION: Persistent ileus with increased gaseous distention of the stomach.

## 2017-03-25 MED ORDER — DEXTROSE 5 % IV SOLN: 1.0000 g | INTRAVENOUS | Status: DC

## 2017-03-25 MED ORDER — MIRTAZAPINE 15 MG PO TABS: 7.5000 mg | ORAL_TABLET | Freq: Every day | ORAL | Status: DC

## 2017-03-25 MED ORDER — FLEET ENEMA 7-19 GM/118ML RE ENEM
1.0000 | ENEMA | Freq: Once | RECTAL | Status: AC
  Administered 2017-03-25: 1 via RECTAL
  Filled 2017-03-25: qty 1

## 2017-03-25 MED ORDER — ACETAMINOPHEN 325 MG PO TABS
650.0000 mg | ORAL_TABLET | Freq: Four times a day (QID) | ORAL | Status: DC | PRN
  Administered 2017-03-25 – 2017-03-31 (×10): 650 mg via ORAL
  Filled 2017-03-25 (×10): qty 2

## 2017-03-25 MED ORDER — ADULT MULTIVITAMIN W/MINERALS CH
1.0000 | ORAL_TABLET | Freq: Every day | ORAL | Status: DC
  Administered 2017-03-25 – 2017-03-31 (×7): 1 via ORAL
  Filled 2017-03-25 (×8): qty 1

## 2017-03-25 MED ORDER — BISACODYL 10 MG RE SUPP: 10.0000 mg | Freq: Every day | RECTAL | Status: DC | PRN

## 2017-03-25 MED ORDER — LACTULOSE 10 GM/15ML PO SOLN
30.0000 g | Freq: Every day | ORAL | Status: DC | PRN
  Administered 2017-03-26: 30 g via ORAL
  Filled 2017-03-25 (×3): qty 45

## 2017-03-25 MED ORDER — TRAMADOL HCL 50 MG PO TABS
50.0000 mg | ORAL_TABLET | Freq: Once | ORAL | Status: AC
  Administered 2017-03-25: 50 mg via ORAL
  Filled 2017-03-25: qty 1

## 2017-03-25 NOTE — Discharge Instructions (Addendum)
Information on my medicine - ELIQUIS (apixaban)  Why was Eliquis prescribed for you? Eliquis was prescribed for you to reduce the risk of a blood clot forming that can cause a stroke if you have a medical condition called atrial fibrillation (a type of irregular heartbeat).  What do You need to know about Eliquis ? Take your Eliquis TWICE DAILY - one tablet in the morning and one tablet in the evening with or without food. If you have difficulty swallowing the tablet whole please discuss with your pharmacist how to take the medication safely.  Take Eliquis exactly as prescribed by your doctor and DO NOT stop taking Eliquis without talking to the doctor who prescribed the medication.  Stopping may increase your risk of developing a stroke.  Refill your prescription before you run out.  After discharge, you should have regular check-up appointments with your healthcare provider that is prescribing your Eliquis.  In the future your dose may need to be changed if your kidney function or weight changes by a significant amount or as you get older.  What do you do if you miss a dose? If you miss a dose, take it as soon as you remember on the same day and resume taking twice daily.  Do not take more than one dose of ELIQUIS at the same time to make up a missed dose.  Important Safety Information A possible side effect of Eliquis is bleeding. You should call your healthcare provider right away if you experience any of the following: ? Bleeding from an injury or your nose that does not stop. ? Unusual colored urine (red or dark brown) or unusual colored stools (red or black). ? Unusual bruising for unknown reasons. ? A serious fall or if you hit your head (even if there is no bleeding).  Some medicines may interact with Eliquis and might increase your risk of bleeding or clotting while on Eliquis. To help avoid this, consult your healthcare provider or pharmacist prior to using any new  prescription or non-prescription medications, including herbals, vitamins, non-steroidal anti-inflammatory drugs (NSAIDs) and supplements.  This website has more information on Eliquis (apixaban): http://www.eliquis.com/eliquis/home      [facility] Discharge Instructions   General Discharge Instructions  WEIGHT BEARING STATUS: Touchdown weightbearing Left leg  RANGE OF MOTION/ACTIVITY: unrestricted range of motion L leg    Diet: as you were eating previously.  Can use over the counter stool softeners and bowel preparations, such as Miralax, to help with bowel movements.  Narcotics can be constipating.  Be sure to drink plenty of fluids  PAIN MEDICATION USE AND EXPECTATIONS  You have likely been given narcotic medications to help control your pain.  After a traumatic event that results in an fracture (broken bone) with or without surgery, it is ok to use narcotic pain medications to help control one's pain.  We understand that everyone responds to pain differently and each individual patient will be evaluated on a regular basis for the continued need for narcotic medications. Ideally, narcotic medication use should last no more than 6-8 weeks (coinciding with fracture healing).   As a patient it is your responsibility as well to monitor narcotic medication use and report the amount and frequency you use these medications when you come to your office visit.   We would also advise that if you are using narcotic medications, you should take a dose prior to therapy to maximize you participation.  IF YOU ARE ON NARCOTIC MEDICATIONS IT IS  NOT PERMISSIBLE TO OPERATE A MOTOR VEHICLE (MOTORCYCLE/CAR/TRUCK/MOPED) OR HEAVY MACHINERY DO NOT MIX NARCOTICS WITH OTHER CNS (CENTRAL NERVOUS SYSTEM) DEPRESSANTS SUCH AS ALCOHOL   STOP SMOKING OR USING NICOTINE PRODUCTS!!!!  As discussed nicotine severely impairs your body's ability to heal surgical and traumatic wounds but also impairs  bone healing.  Wounds and bone heal by forming microscopic blood vessels (angiogenesis) and nicotine is a vasoconstrictor (essentially, shrinks blood vessels).  Therefore, if vasoconstriction occurs to these microscopic blood vessels they essentially disappear and are unable to deliver necessary nutrients to the healing tissue.  This is one modifiable factor that you can do to dramatically increase your chances of healing your injury.    (This means no smoking, no nicotine gum, patches, etc)  DO NOT USE NONSTEROIDAL ANTI-INFLAMMATORY DRUGS (NSAID'S)  Using products such as Advil (ibuprofen), Aleve (naproxen), Motrin (ibuprofen) for additional pain control during fracture healing can delay and/or prevent the healing response.  If you would like to take over the counter (OTC) medication, Tylenol (acetaminophen) is ok.  However, some narcotic medications that are given for pain control contain acetaminophen as well. Therefore, you should not exceed more than 4000 mg of tylenol in a day if you do not have liver disease.  Also note that there are may OTC medicines, such as cold medicines and allergy medicines that my contain tylenol as well.  If you have any questions about medications and/or interactions please ask your doctor/PA or your pharmacist.      ICE AND ELEVATE INJURED/OPERATIVE EXTREMITY  Using ice and elevating the injured extremity above your heart can help with swelling and pain control.  Icing in a pulsatile fashion, such as 20 minutes on and 20 minutes off, can be followed.    Do not place ice directly on skin. Make sure there is a barrier between to skin and the ice pack.    Using frozen items such as frozen peas works well as the conform nicely to the are that needs to be iced.  USE AN ACE WRAP OR TED HOSE FOR SWELLING CONTROL  In addition to icing and elevation, Ace wraps or TED hose are used to help limit and resolve swelling.  It is recommended to use Ace wraps or TED hose until you are  informed to stop.    When using Ace Wraps start the wrapping distally (farthest away from the body) and wrap proximally (closer to the body)   Example: If you had surgery on your leg or thing and you do not have a splint on, start the ace wrap at the toes and work your way up to the thigh        If you had surgery on your upper extremity and do not have a splint on, start the ace wrap at your fingers and work your way up to the upper arm    CALL THE OFFICE WITH ANY QUESTIONS OR CONCERNS: [phone]

## 2017-03-25 NOTE — Progress Notes (Signed)
PROGRESS NOTE    Tracey Porter  ZOX:[id number] DOB: [birth year] DOA: 03/19/2017 PCP: [place], Julie M, NP (Confirm with patient/family/NH records and if not entered, this HAS to be entered at [facility] point of entry. "No PCP" if truly none.)   Brief Narrative 83 year old female admitted with A. fib RVR, status post fall multiple closed pelvic fracture nonsurgical, now complicated with ileus, COPD exacerbation. Patient had a CT scan of her head without contrast which did not reveal anything acute. VQ scan was low probability for pulmonary embolism. She was started on digoxin yesterday along with Cardizem and amiodarone. Her heart rate seems to be better today less than 100. Patient is more awake less confused. Daughter by the bedside.    Assessment & Plan:   Principal Problem:   Multiple fractures of pelvis with unstable disruption of pelvic ring, initial encounter for closed fracture (HCC) Active Problems:   DM2 (diabetes mellitus, type 2) (HCC)   Paroxysmal SVT (supraventricular tachycardia) (HCC)   Essential hypertension   Closed pelvic fracture (HCC)   Leukocytosis   Fall at home, initial encounter   Atrial fibrillation with RVR (HCC)   Aortic atherosclerosis (HCC)   Chronic atrial fibrillation (HCC)   Hypotension due to blood loss   Coagulopathy (HCC)   Chronic diastolic heart failure (HCC)   Multiple fractures of the pelvis with disruption of pelvic ring status post mechanical fall PT and OT eval. Tylenol for pain control. Avoid narcotics which could exacerbate ileus.  A. fib with rapid ventricular response followed by cardiology currently on Cardizem and amiodarone and digitoxin.   Leukocytosis resolving no evidence of infection found yet.   COPD exacerbation better after his receiving Solu-Medrol.   Persistent ileus minimize narcotics and encourage patient to get out of bed as tolerated. Follow-up KUB in the morning. Chronic diastolic heart failure patient was on diuretics at  home which is on hold at this time due to nothing by mouth status and ileus.    DVT prophylaxis:  Code Status:  Family Communication:  Disposition Plan   Consultants  Procedures:   Antimicrobials:    Subjective:   Objective: Vitals:   03/24/17 1917 03/24/17 2343 03/25/17 0318 03/25/17 0800  BP: 135/64 (!) 111/48 139/79 127/68  Pulse: 67 70 96   Resp: (!) Temp: 97.9 F (36.6 C) 98.2 F (36.8 C) 98.5 F (36.9 C) 97.7 F (36.5 C)  TempSrc: Oral Oral Oral Oral  SpO2: 92% 96% 96%   Weight:      Height:        Intake/Output Summary (Last 24 hours) at 03/25/17 1159 Last data filed at 03/25/17 1018  Gross per 24 hour  Intake            732.3 ml  Output             1400 ml  Net           -667.7 ml   Filed Weights   03/20/17 0150  Weight: 77.1 kg (170 lb)    Examination:  General exam: Appears calm and comfortable  Respiratory system: decresed wheezing. Respiratory effort tachypneic. Cardiovascular system: S1 & S2 heard, RRR. No JVD, murmurs, rubs, gallops or clicks. No pedal edema. Gastrointestinal system: Abdomen is nondistended, soft and nontender. No organomegaly or masses felt. Normal bowel sounds heard. Central nervous system: Alert and oriented. No focal neurological deficits. Extremities: Symmetric 5 x 5 power. Skin: No rashes, lesions or ulcers Psychiatry: Judgement and insight appear normal.  Mood & affect appropriate.     Data Reviewed: I have personally reviewed following labs and imaging studies  CBC:  Recent Labs Lab 03/22/17 0804 03/22/17 1700 03/23/17 0428 03/24/17 0338 03/25/17 0410  WBC 19.6* 20.7* 23.0* 17.5* 10.4  NEUTROABS 17.1* 17.5* 19.6* 14.2* 9.4*  HGB 8.5* 8.4* 8.5* 8.1* 7.4*  HCT 25.6* 24.9* 25.6* 25.1* 23.2*  MCV 90.1 90.5 91.1 93.0 93.5  PLT 222 230 253 312 310   Basic Metabolic Panel:  Recent Labs Lab 03/20/17 0404 03/21/17 0226 03/22/17 1345 03/23/17 0428 03/24/17 0338 03/25/17 0410  NA 136 138  135 134* 137 137  K 4.1 4.9 4.1 3.9 3.9 3.9  CL 101 106 105 101 102 103  CO2 24 21* GLUCOSE 239* 156* 158* 148* 152* 156*  BUN 21* 36* 25* 22* 21* 21*  CREATININE 0.98 2.66* 1.07* 0.84 0.78 0.91  CALCIUM 8.9 8.2* 7.9* 8.3* 8.4* 8.2*  MG 1.5*  --   --   --   --   --   PHOS 4.5  --   --   --   --   --    GFR: Estimated Creatinine Clearance: 47.4 mL/min (by C-G formula based on SCr of 0.91 mg/dL). Liver Function Tests:  Recent Labs Lab 03/19/17 2010 03/20/17 0404 03/24/17 0338 03/25/17 0410  AST ALT ALKPHOS 74 61 53 53  BILITOT 0.8 1.0 1.4* 1.7*  PROT 7.1 6.6 5.8* 5.7*  ALBUMIN 4.0 3.5 2.9* 2.7*   No results for input(s): LIPASE, AMYLASE in the last 168 hours. No results for input(s): AMMONIA in the last 168 hours. Coagulation Profile:  Recent Labs Lab 03/19/17 2010  INR 1.22   Cardiac Enzymes: No results for input(s): CKTOTAL, CKMB, CKMBINDEX, TROPONINI in the last 168 hours. BNP (last 3 results) No results for input(s): PROBNP in the last 8760 hours. HbA1C: No results for input(s): HGBA1C in the last 72 hours. CBG:  Recent Labs Lab 03/24/17 1519 03/24/17 1959 03/24/17 2347 03/25/17 0350 03/25/17 0826  GLUCAP 136* 146* 144* 151* 178*   Lipid Profile: No results for input(s): CHOL, HDL, LDLCALC, TRIG, CHOLHDL, LDLDIRECT in the last 72 hours. Thyroid Function Tests:  Recent Labs  03/25/17 0800  TSH 0.351   Anemia Panel: No results for input(s): VITAMINB12, FOLATE, FERRITIN, TIBC, IRON, RETICCTPCT in the last 72 hours. Sepsis Labs: No results for input(s): PROCALCITON, LATICACIDVEN in the last 168 hours.  Recent Results (from the past 240 hour(s))  Surgical pcr screen     Status: None   Collection Time: 03/20/17  1:50 AM  Result Value Ref Range Status   MRSA, PCR NEGATIVE NEGATIVE Final   Staphylococcus aureus NEGATIVE NEGATIVE Final    Comment: (NOTE) The Xpert SA Assay (FDA approved for NASAL specimens in  patients 60 years of age and older), is one component of a comprehensive surveillance program. It is not intended to diagnose infection nor to guide or monitor treatment.   Culture, Urine     Status: Abnormal (Preliminary result)   Collection Time: 03/24/17 11:29 AM  Result Value Ref Range Status   Specimen Description URINE, CATHETERIZED  Final   Special Requests NONE  Final   Culture >=100,000 COLONIES/mL ESCHERICHIA COLI (A)  Final   Report Status PENDING  Incomplete         Radiology Studies: Ct Head Wo Contrast  Result Date: 03/24/2017 CLINICAL DATA:  Altered level of consciousness.  History of hypertension and diabetes. EXAM: CT HEAD WITHOUT CONTRAST TECHNIQUE: Contiguous axial images were obtained from the base of the skull through the vertex without intravenous contrast. COMPARISON:  None. FINDINGS: BRAIN: No intraparenchymal hemorrhage, mass effect nor midline shift. The ventricles and sulci are normal for age. Patchy supratentorial white matter hypodensities less than expected for patient's age, though non-specific are most compatible with chronic small vessel ischemic disease. No acute large vascular territory infarcts. No abnormal extra-axial fluid collections. Basal cisterns are patent. VASCULAR: Moderate calcific atherosclerosis of the carotid siphons. SKULL: No skull fracture. Severe temporomandibular osteoarthrosis. Subcentimeter lucent lesion LEFT C2 body, incompletely evaluated. No significant scalp soft tissue swelling. SINUSES/ORBITS: The mastoid air-cells and included paranasal sinuses are well-aerated.The included ocular globes and orbital contents are non-suspicious. Status post RIGHT ocular lens implant. OTHER: None. IMPRESSION: 1. Negative noncontrast CT HEAD for age. 2. Partially imaged subcentimeter lucency C2 would be better characterized on dedicated CT if clinically indicated. Electronically Signed   By: Awilda Metro M.D.   On: 03/24/2017 18:59   Dg Pelvis  Comp Min 3v  Result Date: 03/23/2017 CLINICAL DATA:  Pelvic fractures. EXAM: JUDET PELVIS - 3+ VIEW COMPARISON:  CT abdomen and pelvis from yesterday. FINDINGS: Again seen are mild inferiorly displaced fractures of the left superior and inferior pubic rami. Nondisplaced fractures of the right superior and inferior pubic rami. The pubic symphysis is normal. No pelvic diastasis. The sacrum is obscured by bowel gas. Degenerative changes of the bilateral hip joints. Osteopenia. IMPRESSION: 1. Unchanged mild inferiorly displaced fractures of the left superior and inferior pubic rami. 2. Unchanged nondisplaced fractures of the right superior and inferior pubic rami. Electronically Signed   By: Obie Dredge M.D.   On: 03/23/2017 13:15   Nm Pulmonary Perf And Vent  Result Date: 03/23/2017 CLINICAL DATA:  Multiple pelvic fractures status post mechanical fall complicated by pelvic hematoma. EXAM: NUCLEAR MEDICINE VENTILATION - PERFUSION LUNG SCAN TECHNIQUE: Ventilation images were obtained in multiple projections using inhaled aerosol Tc-38m DTPA. Perfusion images were obtained in multiple projections after intravenous injection of Tc-97m MAA. RADIOPHARMACEUTICALS:  30.4 mCi Technetium-28m DTPA aerosol inhalation and 4.36 mCi Technetium-55m MAA IV COMPARISON:  CXR from earlier on the same day FINDINGS: Ventilation: Central air trapping of inhaled aerosolized material. Perfusion: No wedge shaped peripheral perfusion defects to suggest acute pulmonary embolism. IMPRESSION: No moderate or large perfusion-ventilation mismatches. Findings in keeping with very low probability for pulmonary embolus. Electronically Signed   By: Tollie Eth M.D.   On: 03/23/2017 21:42   Dg Chest Port 1 View  Result Date: 03/24/2017 CLINICAL DATA:  Confirm line placement, Modus II research study. Head 45 degrees and arm 30 to 35 degrees. EXAM: PORTABLE CHEST 1 VIEW COMPARISON:  03/24/2017 FINDINGS: Patient has right-sided PICC line  catheter, tip overlying the level of the right atrium. Consider withdrawing catheter approximately 2 cm into the lower superior vena cava. No pneumothorax. Shallow lung inflation. Calibration ring overlies the left axillary region. IMPRESSION: Right-sided PICC line tip overlying the level of the right atrium. Electronically Signed   By: Norva Pavlov M.D.   On: 03/24/2017 15:46   Dg Chest Port 1 View  Result Date: 03/24/2017 CLINICAL DATA:  Wheezing today EXAM: PORTABLE CHEST 1 VIEW COMPARISON:  Portable chest x-ray of 03/23/2017 FINDINGS: Mild bibasilar atelectasis remains with suboptimal inspiration. No pneumonia or effusion is seen. Mediastinal and hilar contours are unchanged and mild cardiomegaly stable. Moderate thoracic aortic atherosclerosis is noted. IMPRESSION: 1. Suboptimal inspiration  with mild bibasilar atelectasis. 2. Stable mild cardiomegaly. Electronically Signed   By: Dwyane Dee M.D.   On: 03/24/2017 11:57   Dg Chest Port 1 View  Result Date: 03/23/2017 CLINICAL DATA:  Shortness of breath. EXAM: PORTABLE CHEST 1 VIEW COMPARISON:  March 22, 2017. FINDINGS: Stable cardiomegaly. Normal pulmonary vascularity. Persistent low lung volumes and bibasilar atelectasis. No pleural effusion or pneumothorax. IMPRESSION: Stable cardiomegaly and low lung volumes with bibasilar atelectasis. Electronically Signed   By: Obie Dredge M.D.   On: 03/23/2017 13:10   Dg Abd Portable 1v  Result Date: 03/25/2017 CLINICAL DATA:  Ileus, history diabetes mellitus, hypertension EXAM: PORTABLE ABDOMEN - 1 VIEW COMPARISON:  Portable exam 0633 hours compared to 03/24/2017 FINDINGS: Gas distention of stomach. Diffuse gaseous distention of small bowel loops throughout abdomen. Scattered gas and stool in colon. Stool in rectum. Findings are most consistent with ileus, unchanged. Bone severely demineralized with again identified LEFT superior pubic ramus fracture. IMPRESSION: Persistent ileus with increased  gaseous distention of the stomach. Electronically Signed   By: Ulyses Southward M.D.   On: 03/25/2017 07:22   Dg Abd Portable 1v  Result Date: 03/24/2017 CLINICAL DATA:  Ileus EXAM: PORTABLE ABDOMEN - 1 VIEW COMPARISON:  03/23/2017 FINDINGS: Gaseous distention of large and small bowel appears unchanged. Minimal air in the rectum. Bilateral pubic rami fractures noted. IMPRESSION: Adynamic ileus unchanged. Electronically Signed   By: Marlan Palau M.D.   On: 03/24/2017 07:37   Dg Abd Portable 1v  Result Date: 03/23/2017 CLINICAL DATA:  Abdominal pain. EXAM: PORTABLE ABDOMEN - 1 VIEW COMPARISON:  CT abdomen pelvis from yesterday. FINDINGS: Multiple mildly dilated loops of air-filled small bowel are again seen. Stool is noted in the right colon. Bilateral pubic rami fractures. IMPRESSION: Findings suggestive of ileus. Electronically Signed   By: Obie Dredge M.D.   On: 03/23/2017 13:17        Scheduled Meds: . acyclovir  800 mg Oral BID  . apixaban  5 mg Oral BID  . atorvastatin  40 mg Oral Daily  . Chlorhexidine Gluconate Cloth  6 each Topical Daily  . digoxin  0.125 mg Intravenous Daily  . insulin aspart  0-9 Units Subcutaneous Q4H  . methylPREDNISolone (SOLU-MEDROL) injection  40 mg Intravenous Q6H  . mirabegron ER  25 mg Oral Daily  . multivitamin with minerals  1 tablet Oral Daily  . pantoprazole  40 mg Oral Daily  . prednisoLONE acetate  1 drop Right Eye BID  . senna  1 tablet Oral BID  . sodium chloride flush  10-40 mL Intracatheter Q12H   Continuous Infusions: . sodium chloride 75 mL/hr at 03/25/17 0101  . amiodarone 30 mg/hr (03/25/17 0126)  . diltiazem (CARDIZEM) infusion 15 mg/hr (03/25/17 0800)     LOS: 5 days        Alwyn Ren, MD [facility]  If 7PM-7AM, please contact night-coverage [email or web address] Password [email or web address] 03/25/2017, 11:59 AM

## 2017-03-25 NOTE — Plan of Care (Signed)
Problem: Activity: Goal: Risk for activity intolerance will decrease Outcome: Progressing Patient ambulated to chair with PT. Denied any respiratory distress at that time.

## 2017-03-25 NOTE — Progress Notes (Signed)
Physical Therapy Treatment Patient Details Name: Tracey Porter MRN: [id number] DOB: [birth year] Today's Date: 03/25/2017    History of Present Illness Pt is an 83 yo female admitted after a fall with pelvic fx w associated hemorrhage.  Pt with mild dementia, h/o afib, currently in afib with RVR. Pt was in traction but now out of traction and attempting conservative management with TDWB on LLE and WBAT RLE.    PT Comments    Patient making very slow progress with mobility.  Agree with need for SNF at d/c.   Follow Up Recommendations  SNF;Supervision for mobility/OOB     Equipment Recommendations  Rolling walker with 5" wheels (Youth-sized RW)    Recommendations for Other Services       Precautions / Restrictions Precautions Precautions: Fall Restrictions Weight Bearing Restrictions: Yes RLE Weight Bearing: Weight bearing as tolerated LLE Weight Bearing: Touchdown weight bearing    Mobility  Bed Mobility Overal bed mobility: Needs Assistance Bed Mobility: Supine to Sit     Supine to sit: Max assist;+2 for physical assistance     General bed mobility comments: Verbal and tactile cues for transition to sitting.  Required max assist to move LLE and to raise trunk to upright position.  Transfers Overall transfer level: Needs assistance Equipment used: Rolling walker (2 wheeled) Transfers: Sit to/from [employer] Sit to Stand: Max assist;+2 physical assistance Stand pivot transfers: Max assist;+2 physical assistance       General transfer comment: Verbal and tactile cues to stand, maintaining TDWB on LLE.  Required +2 max assist to stand.  Patient with difficulty using UE's to assist with pivot transfer.  Required +2 max assist to move to recliner.  After rest, patient then attempted stance for second time, requiring +2 max assist.  Patient able to maintain stance x 40 seconds with assist to keep weight off of LLE, and to extend Rt hip/knee.     Ambulation/Gait             General Gait Details: Unable   Stairs            Wheelchair Mobility    Modified Rankin (Stroke Patients Only)       Balance Overall balance assessment: Needs assistance Sitting-balance support: Feet supported;Bilateral upper extremity supported Sitting balance-Leahy Scale: Poor Sitting balance - Comments: Able to sit for short time with min guard assist, but primarily required min-mod assist for balance. Postural control: Right lateral lean;Posterior lean Standing balance support: Bilateral upper extremity supported Standing balance-Leahy Scale: Poor Standing balance comment: Depending on UE support and external assist for standing balance.                            Cognition Arousal/Alertness: Awake/alert Behavior During Therapy: WFL for tasks assessed/performed Overall Cognitive Status: History of cognitive impairments - at baseline                                        Exercises      General Comments        Pertinent Vitals/Pain Pain Assessment: Faces Faces Pain Scale: Hurts little more Pain Location: LLE with mobility and in stance Pain Descriptors / Indicators: Discomfort;Grimacing;Guarding Pain Intervention(s): Limited activity within patient's tolerance;Monitored during session;Repositioned (Patient sits toward Rt hip, avoiding wt on Lt hip)    Home Living  Prior Function            PT Goals (current goals can now be found in the care plan section) Acute Rehab PT Goals Patient Stated Goal: to feel less pain Progress towards PT goals: Progressing toward goals (Slowly)    Frequency    Min 3X/week      PT Plan Current plan remains appropriate    Co-evaluation              AM-PAC PT "6 Clicks" Daily Activity  Outcome Measure  Difficulty turning over in bed (including adjusting bedclothes, sheets and blankets)?: Unable Difficulty moving  from lying on back to sitting on the side of the bed? : Unable Difficulty sitting down on and standing up from a chair with arms (e.g., wheelchair, bedside commode, etc,.)?: Unable Help needed moving to and from a bed to chair (including a wheelchair)?: A Lot Help needed walking in hospital room?: Total Help needed climbing 3-5 steps with a railing? : Total 6 Click Score: 7    End of Session Equipment Utilized During Treatment: Gait belt Activity Tolerance: Patient limited by fatigue;Patient limited by pain Patient left: in chair;with call bell/phone within reach;with family/visitor present Nurse Communication: Mobility status PT Visit Diagnosis: Unsteadiness on feet (R26.81);Dizziness and giddiness (R42);Muscle weakness (generalized) (M62.81);Pain Pain - Right/Left: Left Pain - part of body: Hip;Leg     Time: 1610-9604 PT Time Calculation (min) (ACUTE ONLY): 18 min  Charges:  $Therapeutic Activity: 8-22 mins                    G Codes:       Durenda Hurt. Renaldo Fiddler, [facility] Acute Rehab Services Pager [phone]    Vena Austria 03/25/2017, 8:41 PM

## 2017-03-25 NOTE — Progress Notes (Addendum)
Progress Note  Patient Name: Tracey Porter Date of Encounter: 03/25/2017  Primary Cardiologist: Dr. Tresa Endo  Subjective   HR much improved after repeat Amio bolus and adding dig  Inpatient Medications    Scheduled Meds: . acyclovir  800 mg Oral BID  . apixaban  5 mg Oral BID  . atorvastatin  40 mg Oral Daily  . Chlorhexidine Gluconate Cloth  6 each Topical Daily  . digoxin  0.125 mg Intravenous Daily  . insulin aspart  0-9 Units Subcutaneous Q4H  . methylPREDNISolone (SOLU-MEDROL) injection  40 mg Intravenous Q6H  . mirabegron ER  25 mg Oral Daily  . multivitamin with minerals  1 tablet Oral Daily  . pantoprazole  40 mg Oral Daily  . prednisoLONE acetate  1 drop Right Eye BID  . senna  1 tablet Oral BID  . sodium chloride flush  10-40 mL Intracatheter Q12H  . sodium phosphate  1 enema Rectal Once   Continuous Infusions: . sodium chloride 75 mL/hr at 03/25/17 0101  . amiodarone 30 mg/hr (03/25/17 0126)  . diltiazem (CARDIZEM) infusion 15 mg/hr (03/25/17 0800)   PRN Meds: acetaminophen, albuterol, bisacodyl, lactulose, LORazepam, ondansetron **OR** ondansetron (ZOFRAN) IV, sodium chloride flush   Vital Signs    Vitals:   03/24/17 1917 03/24/17 2343 03/25/17 0318 03/25/17 0800  BP: 135/64 (!) 111/48 139/79 127/68  Pulse: 67 70 96   Resp: (!) Temp: 97.9 F (36.6 C) 98.2 F (36.8 C) 98.5 F (36.9 C) 97.7 F (36.5 C)  TempSrc: Oral Oral Oral Oral  SpO2: 92% 96% 96%   Weight:      Height:        Intake/Output Summary (Last 24 hours) at 03/25/17 1014 Last data filed at 03/25/17 0945  Gross per 24 hour  Intake            722.3 ml  Output             2300 ml  Net          -1577.7 ml   Filed Weights   03/20/17 0150  Weight: 170 lb (77.1 kg)    Telemetry    Atrial fibrillation with CVR - Personally Reviewed  ECG    Atrial fibrillation with CVR and nonspecific ST/T wave abnormality - Personally Reviewed  Physical Exam   GEN: No acute  distress.   Neck: No JVD Cardiac: irregularly irregular, no murmurs, rubs, or gallops.  Respiratory: diffuse expiratory wheezes GI: Soft, nontender, non-distended  MS: No edema; No deformity. Neuro:  Nonfocal  Psych: Normal affect   Labs    Chemistry Recent Labs Lab 03/20/17 0404  03/23/17 0428 03/24/17 0338 03/25/17 0410  NA 136  < > 134* 137 137  K 4.1  < > 3.9 3.9 3.9  CL 101  < > 101 102 103  CO2 24  < > GLUCOSE 239*  < > 148* 152* 156*  BUN 21*  < > 22* 21* 21*  CREATININE 0.98  < > 0.84 0.78 0.91  CALCIUM 8.9  < > 8.3* 8.4* 8.2*  PROT 6.6  --   --  5.8* 5.7*  ALBUMIN 3.5  --   --  2.9* 2.7*  AST 24  --   --  26 21  ALT 22  --   --  22 23  ALKPHOS 61  --   --  53 53  BILITOT 1.0  --   --  1.4*  1.7*  GFRNONAA 51*  < > >60 >60 56*  GFRAA 59*  < > >60 >60 >60  ANIONGAP 11  < > < > = values in this interval not displayed.   Hematology Recent Labs Lab 03/23/17 0428 03/24/17 0338 03/25/17 0410  WBC 23.0* 17.5* 10.4  RBC 2.81* 2.70* 2.48*  HGB 8.5* 8.1* 7.4*  HCT 25.6* 25.1* 23.2*  MCV 91.1 93.0 93.5  MCH 30.2 30.0 29.8  MCHC 33.2 32.3 31.9  RDW 15.4 15.5 15.9*  PLT 253 312 310    Cardiac EnzymesNo results for input(s): TROPONINI in the last 168 hours. No results for input(s): TROPIPOC in the last 168 hours.   BNP Recent Labs Lab 03/24/17 1148  BNP 373.3*     DDimer No results for input(s): DDIMER in the last 168 hours.   Radiology    Ct Head Wo Contrast  Result Date: 03/24/2017 CLINICAL DATA:  Altered level of consciousness. History of hypertension and diabetes. EXAM: CT HEAD WITHOUT CONTRAST TECHNIQUE: Contiguous axial images were obtained from the base of the skull through the vertex without intravenous contrast. COMPARISON:  None. FINDINGS: BRAIN: No intraparenchymal hemorrhage, mass effect nor midline shift. The ventricles and sulci are normal for age. Patchy supratentorial white matter hypodensities less than expected for  patient's age, though non-specific are most compatible with chronic small vessel ischemic disease. No acute large vascular territory infarcts. No abnormal extra-axial fluid collections. Basal cisterns are patent. VASCULAR: Moderate calcific atherosclerosis of the carotid siphons. SKULL: No skull fracture. Severe temporomandibular osteoarthrosis. Subcentimeter lucent lesion LEFT C2 body, incompletely evaluated. No significant scalp soft tissue swelling. SINUSES/ORBITS: The mastoid air-cells and included paranasal sinuses are well-aerated.The included ocular globes and orbital contents are non-suspicious. Status post RIGHT ocular lens implant. OTHER: None. IMPRESSION: 1. Negative noncontrast CT HEAD for age. 2. Partially imaged subcentimeter lucency C2 would be better characterized on dedicated CT if clinically indicated. Electronically Signed   By: Awilda Metro M.D.   On: 03/24/2017 18:59   Dg Pelvis Comp Min 3v  Result Date: 03/23/2017 CLINICAL DATA:  Pelvic fractures. EXAM: JUDET PELVIS - 3+ VIEW COMPARISON:  CT abdomen and pelvis from yesterday. FINDINGS: Again seen are mild inferiorly displaced fractures of the left superior and inferior pubic rami. Nondisplaced fractures of the right superior and inferior pubic rami. The pubic symphysis is normal. No pelvic diastasis. The sacrum is obscured by bowel gas. Degenerative changes of the bilateral hip joints. Osteopenia. IMPRESSION: 1. Unchanged mild inferiorly displaced fractures of the left superior and inferior pubic rami. 2. Unchanged nondisplaced fractures of the right superior and inferior pubic rami. Electronically Signed   By: Obie Dredge M.D.   On: 03/23/2017 13:15   Nm Pulmonary Perf And Vent  Result Date: 03/23/2017 CLINICAL DATA:  Multiple pelvic fractures status post mechanical fall complicated by pelvic hematoma. EXAM: NUCLEAR MEDICINE VENTILATION - PERFUSION LUNG SCAN TECHNIQUE: Ventilation images were obtained in multiple projections  using inhaled aerosol Tc-70m DTPA. Perfusion images were obtained in multiple projections after intravenous injection of Tc-11m MAA. RADIOPHARMACEUTICALS:  30.4 mCi Technetium-3m DTPA aerosol inhalation and 4.36 mCi Technetium-67m MAA IV COMPARISON:  CXR from earlier on the same day FINDINGS: Ventilation: Central air trapping of inhaled aerosolized material. Perfusion: No wedge shaped peripheral perfusion defects to suggest acute pulmonary embolism. IMPRESSION: No moderate or large perfusion-ventilation mismatches. Findings in keeping with very low probability for pulmonary embolus. Electronically Signed   By: Tollie Eth M.D.   On: 03/23/2017  21:42   Dg Chest Port 1 View  Result Date: 03/24/2017 CLINICAL DATA:  Confirm line placement, Modus II research study. Head 45 degrees and arm 30 to 35 degrees. EXAM: PORTABLE CHEST 1 VIEW COMPARISON:  03/24/2017 FINDINGS: Patient has right-sided PICC line catheter, tip overlying the level of the right atrium. Consider withdrawing catheter approximately 2 cm into the lower superior vena cava. No pneumothorax. Shallow lung inflation. Calibration ring overlies the left axillary region. IMPRESSION: Right-sided PICC line tip overlying the level of the right atrium. Electronically Signed   By: Norva Pavlov M.D.   On: 03/24/2017 15:46   Dg Chest Port 1 View  Result Date: 03/24/2017 CLINICAL DATA:  Wheezing today EXAM: PORTABLE CHEST 1 VIEW COMPARISON:  Portable chest x-ray of 03/23/2017 FINDINGS: Mild bibasilar atelectasis remains with suboptimal inspiration. No pneumonia or effusion is seen. Mediastinal and hilar contours are unchanged and mild cardiomegaly stable. Moderate thoracic aortic atherosclerosis is noted. IMPRESSION: 1. Suboptimal inspiration with mild bibasilar atelectasis. 2. Stable mild cardiomegaly. Electronically Signed   By: Dwyane Dee M.D.   On: 03/24/2017 11:57   Dg Chest Port 1 View  Result Date: 03/23/2017 CLINICAL DATA:  Shortness of breath.  EXAM: PORTABLE CHEST 1 VIEW COMPARISON:  March 22, 2017. FINDINGS: Stable cardiomegaly. Normal pulmonary vascularity. Persistent low lung volumes and bibasilar atelectasis. No pleural effusion or pneumothorax. IMPRESSION: Stable cardiomegaly and low lung volumes with bibasilar atelectasis. Electronically Signed   By: Obie Dredge M.D.   On: 03/23/2017 13:10   Dg Abd Portable 1v  Result Date: 03/25/2017 CLINICAL DATA:  Ileus, history diabetes mellitus, hypertension EXAM: PORTABLE ABDOMEN - 1 VIEW COMPARISON:  Portable exam 0633 hours compared to 03/24/2017 FINDINGS: Gas distention of stomach. Diffuse gaseous distention of small bowel loops throughout abdomen. Scattered gas and stool in colon. Stool in rectum. Findings are most consistent with ileus, unchanged. Bone severely demineralized with again identified LEFT superior pubic ramus fracture. IMPRESSION: Persistent ileus with increased gaseous distention of the stomach. Electronically Signed   By: Ulyses Southward M.D.   On: 03/25/2017 07:22   Dg Abd Portable 1v  Result Date: 03/24/2017 CLINICAL DATA:  Ileus EXAM: PORTABLE ABDOMEN - 1 VIEW COMPARISON:  03/23/2017 FINDINGS: Gaseous distention of large and small bowel appears unchanged. Minimal air in the rectum. Bilateral pubic rami fractures noted. IMPRESSION: Adynamic ileus unchanged. Electronically Signed   By: Marlan Palau M.D.   On: 03/24/2017 07:37   Dg Abd Portable 1v  Result Date: 03/23/2017 CLINICAL DATA:  Abdominal pain. EXAM: PORTABLE ABDOMEN - 1 VIEW COMPARISON:  CT abdomen pelvis from yesterday. FINDINGS: Multiple mildly dilated loops of air-filled small bowel are again seen. Stool is noted in the right colon. Bilateral pubic rami fractures. IMPRESSION: Findings suggestive of ileus. Electronically Signed   By: Obie Dredge M.D.   On: 03/23/2017 13:17    Cardiac Studies   2D echo 05/2016 Study Conclusions  - Left ventricle: The cavity size was normal. Wall thickness  was normal. Systolic function was vigorous. The estimated ejection fraction was in the range of 65% to 70%. Wall motion was normal; there were no regional wall motion abnormalities. Features are consistent with a pseudonormal left ventricular filling pattern, with concomitant abnormal relaxation and increased filling pressure (grade 2 diastolic dysfunction). - Aortic valve: There was mild stenosis. - Mitral valve: Calcified annulus. Mildly calcified leaflets . There was mild to moderate regurgitation. - Left atrium: The atrium was severely dilated. - Tricuspid valve: There was moderate regurgitation. -  Pulmonary arteries: PA peak pressure: 44 mm Hg (S).  Nuclear stress test 02/2017 Study Highlights     Nuclear stress EF: 60%.  The left ventricular ejection fraction is normal (55-65%).  There was no ST segment deviation noted during stress.  The study is normal.  This is a low risk study.  Low risk stress nuclear study with normal perfusion and normal left ventricular regional and global systolic function. Atrial fibrillation during the study.       Patient Profile     83 y.o. female with ahistory of hypertension, diabetes and permanent atrial fibrillation. She had a myocardial perfusion scan done that showed an EF of around 60%. She had an admission at [facility] for a hypertensive crisis and EF was reported to be low there however EF in 2017 was 60-65%. She recently had been seen and her diltiazem had been increased because of increased ventricular response. He had a negative myocardial perfusion scan with an EF of around 60% on August 4. She had a mechanical fall and was admitted with a pelvic fracture with a question of active bleeding noted on CT scan. She has become hypotensive and required fluid boluses. Her hemoglobin dropped from 12.3-8.9.    Assessment & Plan    1. Persistent atrial fibrillation - HR much improved with addition  of dig and rebolusing with IV Amio - will continue Amio IV for now since she has an ileus and likely will have inadequate absorption of PO meds. - continue Cardizem gtt at /hr - avoid BB for now due to wheezing - continue digoxin 0.125mg  IV daily until taking PO - convert heart meds to PO once her ileus has completely resolved  2. Hypotension- this has resolved with blood products and volume resuscitation.  - BP this am 127/107mmHg - continue IV Cardizem to /hr for now until ileus resolved to ensure adequate absorption  3. Chronic diastolic CHF with hypertensive heart disease - recent CHF exacerbation 2 months ago at [facility] in setting of hypertensive urgency - she is actively wheezing on exam today.  Cxray yesterday with no edema. Will repeat xray this am - 2D echo a year ago was normal but then apparently repeated at [facility] and EF 45-50% recently but followup nuclear stress test showed normal LVF.  - BNP elevated yesterday and she got a dose of IV Lasix - cxray showed no edema  4. Mild dementiaby history  5. Aortic atherosclerosis and coronary artery calcifications on chest CT - recent nuclear stress test showed no ischemia and normal LVF  6. Moderate pulmonary HTN on echo 01/2017 at [facility] with PASP .   For questions or updates, please contact [facility] Please consult [email or web address] for contact info under Cardiology/STEMI.      Signed, Armanda Magic, MD  03/25/2017, 10:14 AM

## 2017-03-26 ENCOUNTER — Inpatient Hospital Stay (HOSPITAL_COMMUNITY): Payer: Medicare Other

## 2017-03-26 DIAGNOSIS — I481 Persistent atrial fibrillation: Secondary | ICD-10-CM

## 2017-03-26 DIAGNOSIS — I5033 Acute on chronic diastolic (congestive) heart failure: Secondary | ICD-10-CM

## 2017-03-26 LAB — GLUCOSE, CAPILLARY
GLUCOSE-CAPILLARY: 142 mg/dL — AB (ref 65–99)
GLUCOSE-CAPILLARY: 162 mg/dL — AB (ref 65–99)
Glucose-Capillary: 153 mg/dL — ABNORMAL HIGH (ref 65–99)
Glucose-Capillary: 150 mg/dL — ABNORMAL HIGH (ref 65–99)
Glucose-Capillary: 142 mg/dL — ABNORMAL HIGH (ref 65–99)
Glucose-Capillary: 165 mg/dL — ABNORMAL HIGH (ref 65–99)
Glucose-Capillary: 161 mg/dL — ABNORMAL HIGH (ref 65–99)

## 2017-03-26 LAB — CBC WITH DIFFERENTIAL/PLATELET
BASOS ABS: 0 10*3/uL (ref 0.0–0.1)
BASOS PCT: 0 %
EOS ABS: 0 10*3/uL (ref 0.0–0.7)
Eosinophils Relative: 0 %
HCT: 23 % — ABNORMAL LOW (ref 36.0–46.0)
HEMOGLOBIN: 7.4 g/dL — AB (ref 12.0–15.0)
Lymphocytes Relative: 2 %
Lymphs Abs: 0.4 10*3/uL — ABNORMAL LOW (ref 0.7–4.0)
MCH: 30.2 pg (ref 26.0–34.0)
MCHC: 32.2 g/dL (ref 30.0–36.0)
MCV: 93.9 fL (ref 78.0–100.0)
Monocytes Absolute: 1.5 10*3/uL — ABNORMAL HIGH (ref 0.1–1.0)
Monocytes Relative: 8 %
NEUTROS PCT: 90 %
Neutro Abs: 16.5 10*3/uL — ABNORMAL HIGH (ref 1.7–7.7)
Platelets: 410 10*3/uL — ABNORMAL HIGH (ref 150–400)
RBC: 2.45 MIL/uL — AB (ref 3.87–5.11)
RDW: 15.9 % — ABNORMAL HIGH (ref 11.5–15.5)
WBC: 18.4 10*3/uL — AB (ref 4.0–10.5)

## 2017-03-26 LAB — URINE CULTURE: Culture: >=100000 — AB

## 2017-03-26 IMAGING — DX DG CHEST 1V PORT
1 series · 1 of 1 positions shown · non-contrast
Comparison: 03/24/2017

CLINICAL DATA: Congestive heart failure.  Diabetes.

EXAM:
PORTABLE CHEST 1 VIEW

[chest]
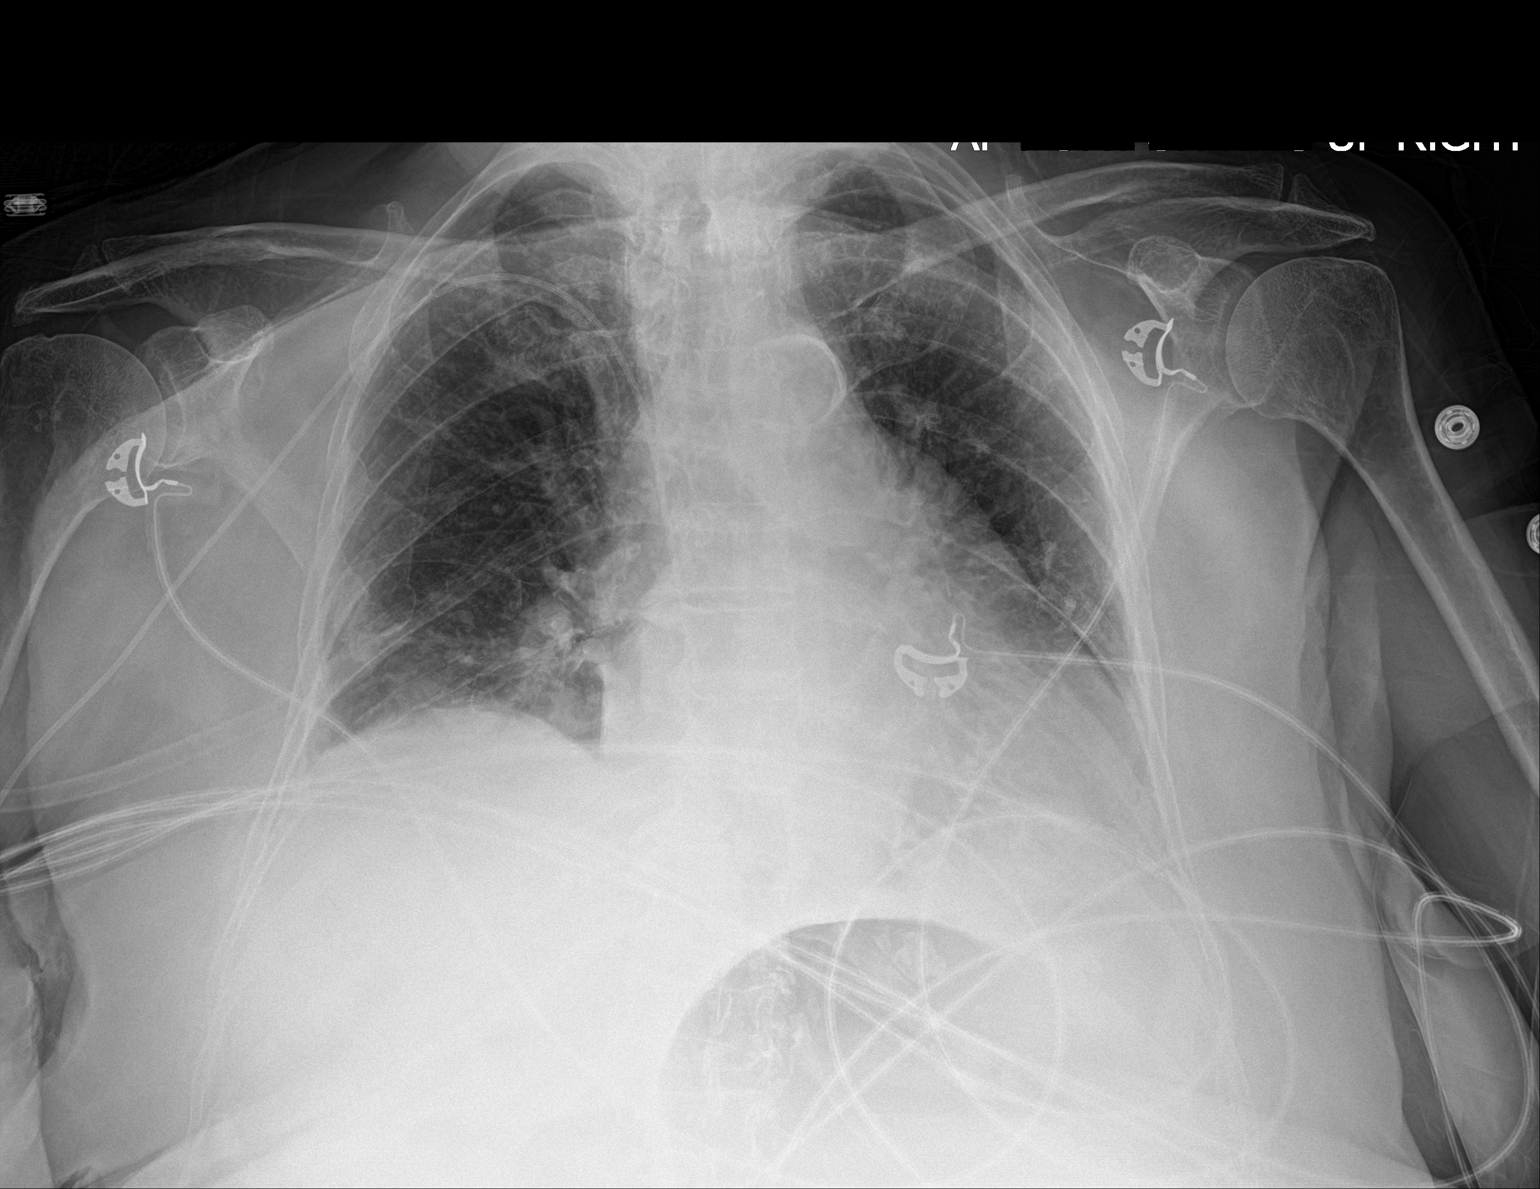

[1 of 1 positions shown; findings below may reference images not displayed]

FINDINGS: Right-sided central line tip: Cavoatrial junction. This is improved
compared to the 03/24/2017 exam.

Atherosclerotic calcification of the aortic arch. Mild enlargement
of the cardiopericardial silhouette. Cephalization of blood flow.
Indistinct bandlike opacity at the right lung base favoring
atelectasis.
IMPRESSION: 1. Mild enlargement of the cardiopericardial silhouette with
cephalization of blood flow suggesting pulmonary venous
hypertension.
2. Bandlike opacity at the right lung base favoring atelectasis.
3. Right PICC line tip is at the cavoatrial junction.

## 2017-03-26 IMAGING — DX DG ABD PORTABLE 1V
1 series · 2 of 2 positions shown · non-contrast
Comparison: One day prior

CLINICAL DATA: Generalized abdominal pain.

EXAM:
PORTABLE ABDOMEN - 1 VIEW

[Series 1: abdomen · 0.14mm/px · 2 of 2 slices shown]
[im 1/2]
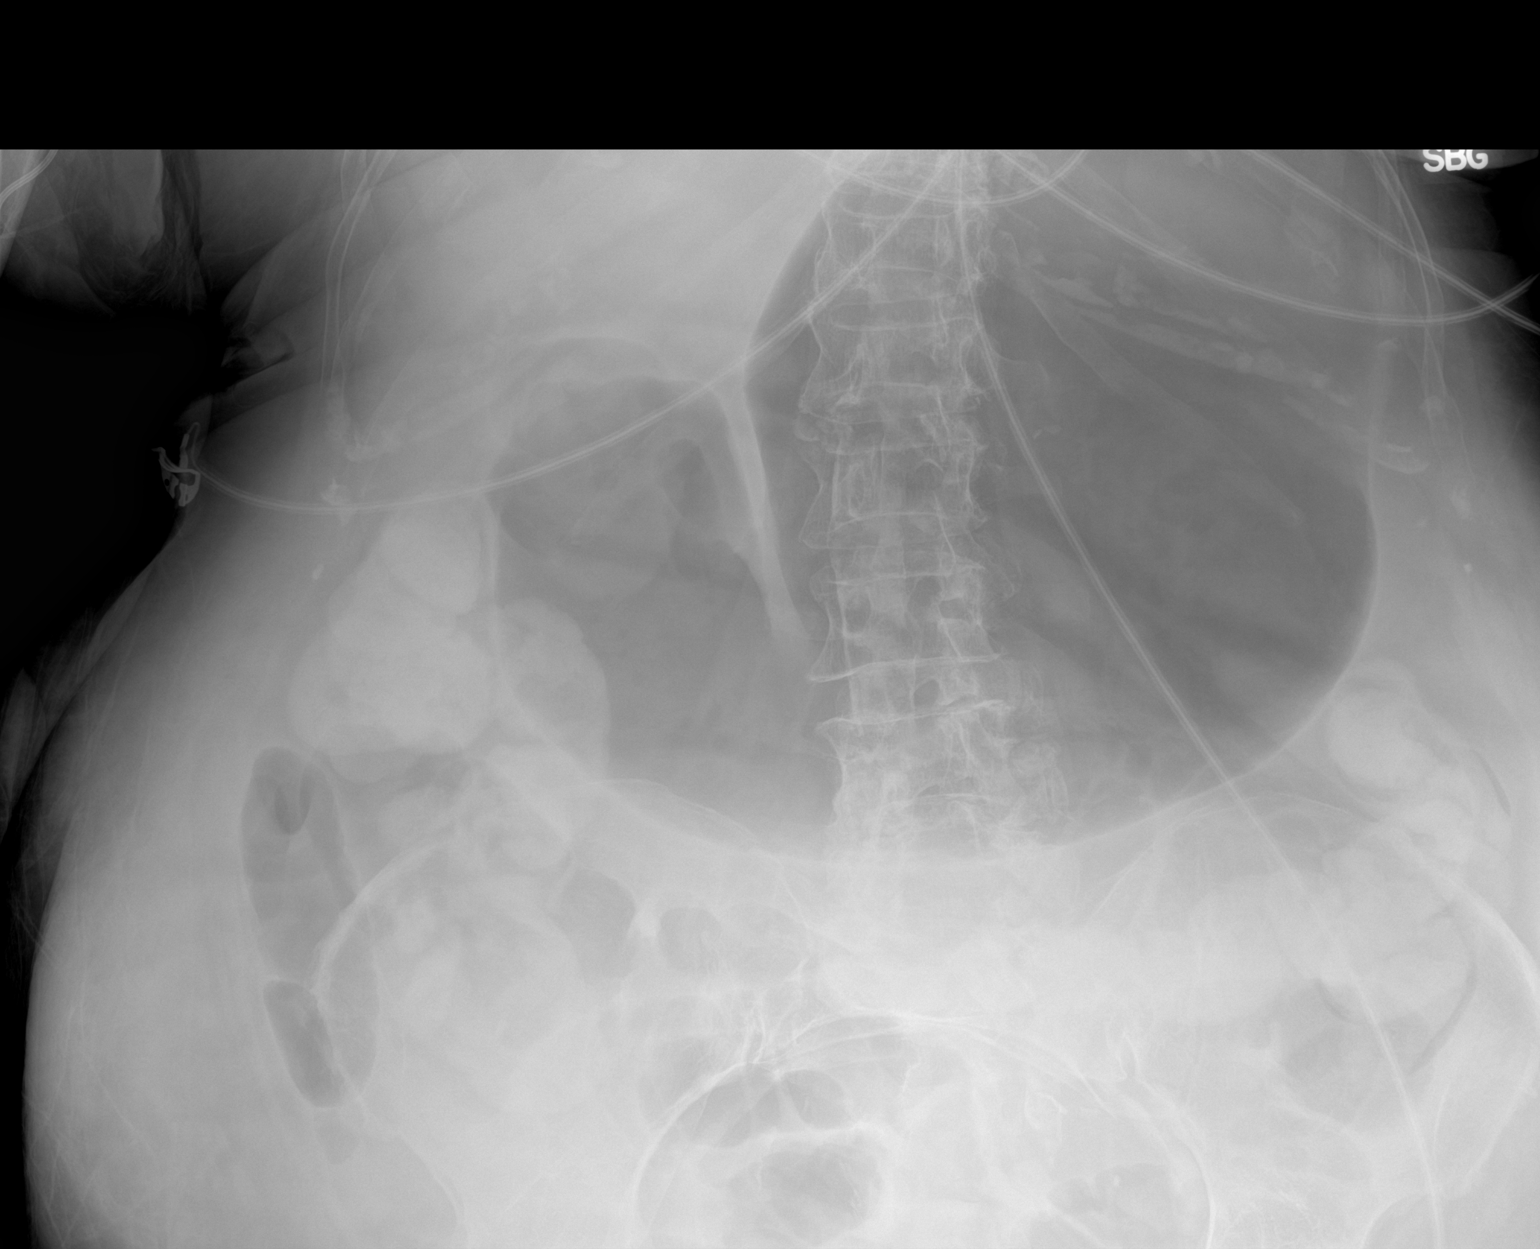
[im 2/2]
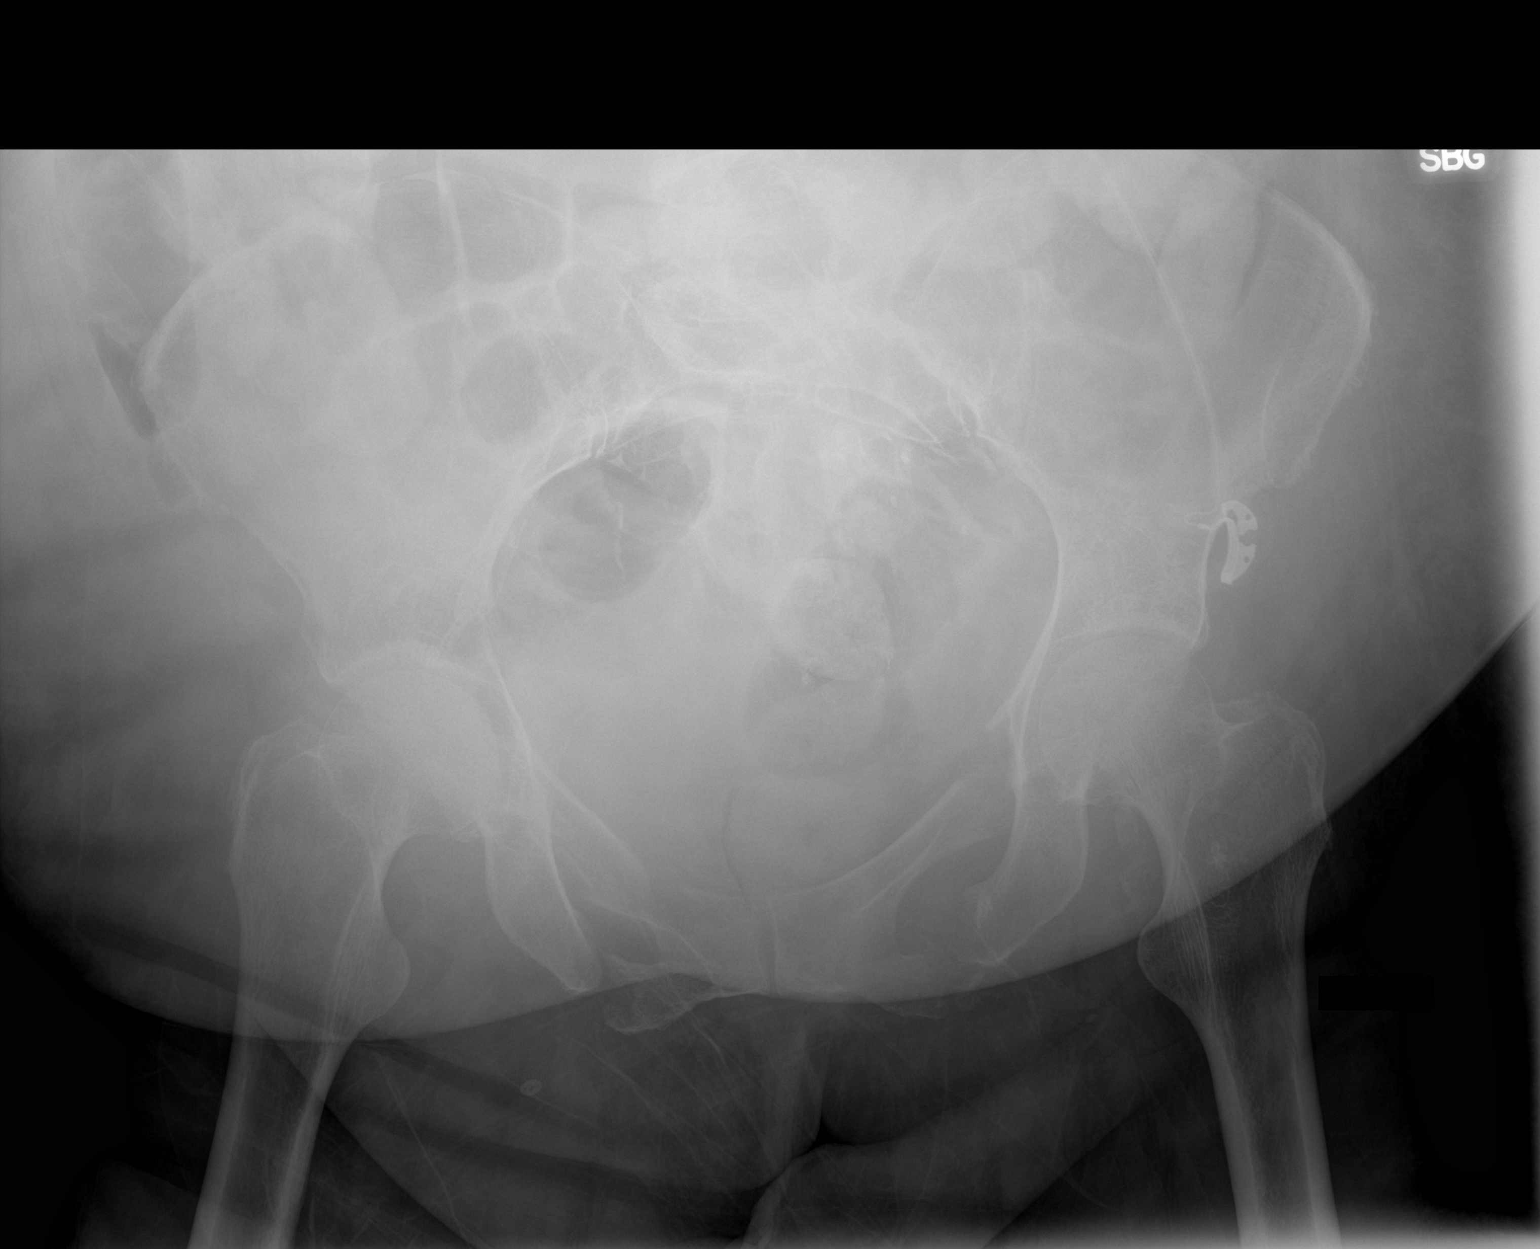

[2 of 2 positions shown; findings below may reference images not displayed]

FINDINGS: Supine views of the abdomen and pelvis. There is persistent moderate
gastric distension. Gaseous distension of small bowel loops is
improved, mild. Gas and stool identified within normal caliber
colon. Osteopenia. Extensive pelvic fractures.
IMPRESSION: Improved appearance of small bowel dilatation, favoring resolving
adynamic ileus or low-grade partial small bowel obstruction.

Persistent moderate gastric distention. Consider nasogastric tube
placement.

## 2017-03-26 MED ORDER — FUROSEMIDE 10 MG/ML IJ SOLN
40.0000 mg | Freq: Once | INTRAMUSCULAR | Status: AC
  Administered 2017-03-26: 40 mg via INTRAVENOUS
  Filled 2017-03-26: qty 4

## 2017-03-26 MED ORDER — METHYLPREDNISOLONE SODIUM SUCC 40 MG IJ SOLR
40.0000 mg | Freq: Two times a day (BID) | INTRAMUSCULAR | Status: DC
  Administered 2017-03-26 – 2017-03-27 (×2): 40 mg via INTRAVENOUS
  Filled 2017-03-26 (×3): qty 1

## 2017-03-26 NOTE — Progress Notes (Addendum)
PROGRESS NOTE    Tracey Porter  ZOX:[id number] DOB: [birth year] DOA: 03/19/2017 PCP: [place], Julie M, NP   Brief Narrative:83 year old female admitted with A. fib RVR, status post fall multiple closed pelvic fracture nonsurgical, now complicated with ileus, COPD exacerbation. Patient had a CT scan of her head without contrast which did not reveal anything acute. VQ scan was low probability for pulmonary embolism. She was started on digoxin yesterday along with Cardizem and amiodarone. Her heart rate seems to be better today less than 100. Patient is more awake less confused. Daughter by the bedside.she continues to have ileus.she worked with PT yest.   Assessment & Plan:   Principal Problem:   Multiple fractures of pelvis with unstable disruption of pelvic ring, initial encounter for closed fracture (HCC) Active Problems:   DM2 (diabetes mellitus, type 2) (HCC)   Paroxysmal SVT (supraventricular tachycardia) (HCC)   Essential hypertension   Closed pelvic fracture (HCC)   Leukocytosis   Fall at home, initial encounter   Atrial fibrillation with RVR (HCC)   Aortic atherosclerosis (HCC)   Chronic atrial fibrillation (HCC)   Hypotension due to blood loss   Coagulopathy (HCC)   Chronic diastolic heart failure (HCC)  ILEUS.Marland Kitchenget her out of bed more with PT/OT.NARCOTICS HAVE BEEN STOPPED.avoid sedatives.dc ativan.continue tylenol.patient is on clear liquids.no nausea or vomiting.she had couple of BM.follow up KUB IN AM.  COPD EXACERBATION.-TAPER SOLUMEDROL as tolerated.continue svns.CXR done yest no acute changes.  Leukocytosis-after starting steroids.monitor.  Multiple pelvic fractures-treated non surgically.pain control with tylenol ONLY.  AFIB RVR -followed by cardiology.on amio/cardizem/dig iv.normal tsh.Eliquis restarted. [name] CORNEAL TRANSPLANT- on acyclovir.  Anemia of chronic disease sp recent bleed pelvic hematoma.hb 7.4 today from 8.1-8.4.monitor.no signs of active bleed.s/p 2  units rbc.  Disposition-transfer to tele once she is off iv antiarrythmics.PT/OT recommends SNF.     DVT prophylaxis: SCD Code Status: FULL Family Communication: WITH DAUGHTER JENNIFER. Disposition Plan  ConsultantsCARDIOLOGY Procedures: PICC  Antimicrobials:NONE    Subjective:feeling better.   Objective: Vitals:   03/26/17 0400 03/26/17 0500 03/26/17 0600 03/26/17 0731  BP: (!) 130/52 135/68 (!) 123/56 112/72  Pulse: 85 (!) 119 66 85  Resp: (!) 21  Temp:    98 F (36.7 C)  TempSrc:    Oral  SpO2: 93% 96% 98% 99%  Weight:      Height:        Intake/Output Summary (Last 24 hours) at 03/26/17 0925 Last data filed at 03/26/17 0733  Gross per 24 hour  Intake           2709.7 ml  Output             1440 ml  Net           1269.7 ml   Filed Weights   03/20/17 0150  Weight: 77.1 kg (170 lb)    Examination:  General exam: Appears calm and comfortable  Respiratory system: minimal wheezing auscultation. Respiratory effort normal. Cardiovascular system: S1 & S2 heard, RRR. No JVD, murmurs, rubs, gallops or clicks. No pedal edema. Gastrointestinal system: Abdomen is nondistended, soft and nontender. No organomegaly or masses felt. Normal bowel sounds heard. Central nervous system: Alert and oriented. No focal neurological deficits. Extremities: Symmetric 5 x 5 power. Skin: No rashes, lesions or ulcers Psychiatry: Judgement and insight appear normal. Mood & affect appropriate.     Data Reviewed: I have personally reviewed following labs and imaging studies  CBC:  Recent Labs Lab 03/22/17 1700 03/23/17 0428 03/24/17  1610 03/25/17 0410 03/26/17 0426  WBC 20.7* 23.0* 17.5* 10.4 18.4*  NEUTROABS 17.5* 19.6* 14.2* 9.4* 16.5*  HGB 8.4* 8.5* 8.1* 7.4* 7.4*  HCT 24.9* 25.6* 25.1* 23.2* 23.0*  MCV 90.5 91.1 93.0 93.5 93.9  PLT 230 253 312 310 410*   Basic Metabolic Panel:  Recent Labs Lab 03/20/17 0404 03/21/17 0226 03/22/17 1345 03/23/17 0428  03/24/17 0338 03/25/17 0410  NA 136 138 135 134* 137 137  K 4.1 4.9 4.1 3.9 3.9 3.9  CL 101 106 105 101 102 103  CO2 24 21* GLUCOSE 239* 156* 158* 148* 152* 156*  BUN 21* 36* 25* 22* 21* 21*  CREATININE 0.98 2.66* 1.07* 0.84 0.78 0.91  CALCIUM 8.9 8.2* 7.9* 8.3* 8.4* 8.2*  MG 1.5*  --   --   --   --   --   PHOS 4.5  --   --   --   --   --    GFR: Estimated Creatinine Clearance: 47.4 mL/min (by C-G formula based on SCr of 0.91 mg/dL). Liver Function Tests:  Recent Labs Lab 03/19/17 2010 03/20/17 0404 03/24/17 0338 03/25/17 0410  AST ALT ALKPHOS 74 61 53 53  BILITOT 0.8 1.0 1.4* 1.7*  PROT 7.1 6.6 5.8* 5.7*  ALBUMIN 4.0 3.5 2.9* 2.7*   No results for input(s): LIPASE, AMYLASE in the last 168 hours. No results for input(s): AMMONIA in the last 168 hours. Coagulation Profile:  Recent Labs Lab 03/19/17 2010  INR 1.22   Cardiac Enzymes: No results for input(s): CKTOTAL, CKMB, CKMBINDEX, TROPONINI in the last 168 hours. BNP (last 3 results) No results for input(s): PROBNP in the last 8760 hours. HbA1C: No results for input(s): HGBA1C in the last 72 hours. CBG:  Recent Labs Lab 03/25/17 1718 03/25/17 2007 03/25/17 2335 03/26/17 0321 03/26/17 0729  GLUCAP 161* 153* 141* 153* 150*   Lipid Profile: No results for input(s): CHOL, HDL, LDLCALC, TRIG, CHOLHDL, LDLDIRECT in the last 72 hours. Thyroid Function Tests:  Recent Labs  03/25/17 0800  TSH 0.351   Anemia Panel: No results for input(s): VITAMINB12, FOLATE, FERRITIN, TIBC, IRON, RETICCTPCT in the last 72 hours. Sepsis Labs: No results for input(s): PROCALCITON, LATICACIDVEN in the last 168 hours.  Recent Results (from the past 240 hour(s))  Surgical pcr screen     Status: None   Collection Time: 03/20/17  1:50 AM  Result Value Ref Range Status   MRSA, PCR NEGATIVE NEGATIVE Final   Staphylococcus aureus NEGATIVE NEGATIVE Final    Comment: (NOTE) The Xpert SA  Assay (FDA approved for NASAL specimens in patients 5 years of age and older), is one component of a comprehensive surveillance program. It is not intended to diagnose infection nor to guide or monitor treatment.   Culture, Urine     Status: Abnormal (Preliminary result)   Collection Time: 03/24/17 11:29 AM  Result Value Ref Range Status   Specimen Description URINE, CATHETERIZED  Final   Special Requests NONE  Final   Culture >=100,000 COLONIES/mL ESCHERICHIA COLI (A)  Final   Report Status PENDING  Incomplete         Radiology Studies: Ct Head Wo Contrast  Result Date: 03/24/2017 CLINICAL DATA:  Altered level of consciousness. History of hypertension and diabetes. EXAM: CT HEAD WITHOUT CONTRAST TECHNIQUE: Contiguous axial images were obtained from the base of the skull through the vertex without intravenous contrast. COMPARISON:  None. FINDINGS: BRAIN: No intraparenchymal hemorrhage, mass effect nor midline shift. The ventricles and sulci are normal for age. Patchy supratentorial white matter hypodensities less than expected for patient's age, though non-specific are most compatible with chronic small vessel ischemic disease. No acute large vascular territory infarcts. No abnormal extra-axial fluid collections. Basal cisterns are patent. VASCULAR: Moderate calcific atherosclerosis of the carotid siphons. SKULL: No skull fracture. Severe temporomandibular osteoarthrosis. Subcentimeter lucent lesion LEFT C2 body, incompletely evaluated. No significant scalp soft tissue swelling. SINUSES/ORBITS: The mastoid air-cells and included paranasal sinuses are well-aerated.The included ocular globes and orbital contents are non-suspicious. Status post RIGHT ocular lens implant. OTHER: None. IMPRESSION: 1. Negative noncontrast CT HEAD for age. 2. Partially imaged subcentimeter lucency C2 would be better characterized on dedicated CT if clinically indicated. Electronically Signed   By: Awilda Metro  M.D.   On: 03/24/2017 18:59   Dg Chest Port 1 View  Result Date: 03/26/2017 CLINICAL DATA:  Congestive heart failure.  Diabetes. EXAM: PORTABLE CHEST 1 VIEW COMPARISON:  03/24/2017 FINDINGS: Right-sided central line tip: Cavoatrial junction. This is improved compared to the 03/24/2017 exam. Atherosclerotic calcification of the aortic arch. Mild enlargement of the cardiopericardial silhouette. Cephalization of blood flow. Indistinct bandlike opacity at the right lung base favoring atelectasis. IMPRESSION: 1. Mild enlargement of the cardiopericardial silhouette with cephalization of blood flow suggesting pulmonary venous hypertension. 2. Bandlike opacity at the right lung base favoring atelectasis. 3. Right PICC line tip is at the cavoatrial junction. Electronically Signed   By: Gaylyn Rong M.D.   On: 03/26/2017 08:36   Dg Chest Port 1 View  Result Date: 03/24/2017 CLINICAL DATA:  Confirm line placement, Modus II research study. Head 45 degrees and arm 30 to 35 degrees. EXAM: PORTABLE CHEST 1 VIEW COMPARISON:  03/24/2017 FINDINGS: Patient has right-sided PICC line catheter, tip overlying the level of the right atrium. Consider withdrawing catheter approximately 2 cm into the lower superior vena cava. No pneumothorax. Shallow lung inflation. Calibration ring overlies the left axillary region. IMPRESSION: Right-sided PICC line tip overlying the level of the right atrium. Electronically Signed   By: Norva Pavlov M.D.   On: 03/24/2017 15:46   Dg Chest Port 1 View  Result Date: 03/24/2017 CLINICAL DATA:  Wheezing today EXAM: PORTABLE CHEST 1 VIEW COMPARISON:  Portable chest x-ray of 03/23/2017 FINDINGS: Mild bibasilar atelectasis remains with suboptimal inspiration. No pneumonia or effusion is seen. Mediastinal and hilar contours are unchanged and mild cardiomegaly stable. Moderate thoracic aortic atherosclerosis is noted. IMPRESSION: 1. Suboptimal inspiration with mild bibasilar atelectasis. 2.  Stable mild cardiomegaly. Electronically Signed   By: Dwyane Dee M.D.   On: 03/24/2017 11:57   Dg Abd Portable 1v  Result Date: 03/26/2017 CLINICAL DATA:  Generalized abdominal pain. EXAM: PORTABLE ABDOMEN - 1 VIEW COMPARISON:  One day prior FINDINGS: Supine views of the abdomen and pelvis. There is persistent moderate gastric distension. Gaseous distension of small bowel loops is improved, mild. Gas and stool identified within normal caliber colon. Osteopenia. Extensive pelvic fractures. IMPRESSION: Improved appearance of small bowel dilatation, favoring resolving adynamic ileus or low-grade partial small bowel obstruction. Persistent moderate gastric distention. Consider nasogastric tube placement. Electronically Signed   By: Jeronimo Greaves M.D.   On: 03/26/2017 08:25   Dg Abd Portable 1v  Result Date: 03/25/2017 CLINICAL DATA:  Ileus, history diabetes mellitus, hypertension EXAM: PORTABLE ABDOMEN - 1 VIEW COMPARISON:  Portable exam 0633 hours compared to 03/24/2017 FINDINGS: Gas distention of stomach. Diffuse gaseous distention of  small bowel loops throughout abdomen. Scattered gas and stool in colon. Stool in rectum. Findings are most consistent with ileus, unchanged. Bone severely demineralized with again identified LEFT superior pubic ramus fracture. IMPRESSION: Persistent ileus with increased gaseous distention of the stomach. Electronically Signed   By: Ulyses Southward M.D.   On: 03/25/2017 07:22        Scheduled Meds: . acyclovir  800 mg Oral BID  . apixaban  5 mg Oral BID  . atorvastatin  40 mg Oral Daily  . Chlorhexidine Gluconate Cloth  6 each Topical Daily  . digoxin  0.125 mg Intravenous Daily  . insulin aspart  0-9 Units Subcutaneous Q4H  . methylPREDNISolone (SOLU-MEDROL) injection  40 mg Intravenous Q6H  . mirabegron ER  25 mg Oral Daily  . multivitamin with minerals  1 tablet Oral Daily  . pantoprazole  40 mg Oral Daily  . prednisoLONE acetate  1 drop Right Eye BID  . senna  1  tablet Oral BID  . sodium chloride flush  10-40 mL Intracatheter Q12H   Continuous Infusions: . sodium chloride 75 mL/hr at 03/26/17 0327  . amiodarone 30 mg/hr (03/26/17 0324)  . diltiazem (CARDIZEM) infusion 15 mg/hr (03/26/17 0324)     LOS: 6 days     Alwyn Ren, MD [facility]   If 7PM-7AM, please contact night-coverage [email or web address] Password [email or web address] 03/26/2017, 9:25 AM

## 2017-03-26 NOTE — Progress Notes (Signed)
Progress Note  Patient Name: Tracey Porter Date of Encounter: 03/26/2017  Primary Cardiologist: Dr. Tresa Endo   Patient Profile     83 y.o. female with ahistory of hypertension, diabetes and persistent atrial fibrillation (chart describes permanent afib BUT was in sinus 4/18) . Nl EF Echo 2017, neg myoview 2018 Admitted 9/8 following fall with pelvic fracture;  cx by ileus  Consult 9/9 for AFib RVR and hypotension and concern for pelvic bleeding 2/2 trauma Ongoing anemia  Rx with amio for rate control with added Dilt/dig     Subjective    without CP SOB  Still wi pelvic and back pain  Inpatient Medications    Scheduled Meds: . acyclovir  800 mg Oral BID  . apixaban  5 mg Oral BID  . atorvastatin  40 mg Oral Daily  . Chlorhexidine Gluconate Cloth  6 each Topical Daily  . digoxin  0.125 mg Intravenous Daily  . insulin aspart  0-9 Units Subcutaneous Q4H  . methylPREDNISolone (SOLU-MEDROL) injection  40 mg Intravenous Q12H  . mirabegron ER  25 mg Oral Daily  . multivitamin with minerals  1 tablet Oral Daily  . pantoprazole  40 mg Oral Daily  . prednisoLONE acetate  1 drop Right Eye BID  . senna  1 tablet Oral BID  . sodium chloride flush  10-40 mL Intracatheter Q12H   Continuous Infusions: . sodium chloride 75 mL/hr at 03/26/17 0327  . amiodarone 30 mg/hr (03/26/17 0324)  . diltiazem (CARDIZEM) infusion 15 mg/hr (03/26/17 1000)   PRN Meds: acetaminophen, albuterol, bisacodyl, lactulose, ondansetron **OR** ondansetron (ZOFRAN) IV, sodium chloride flush   Vital Signs    Vitals:   03/26/17 0400 03/26/17 0500 03/26/17 0600 03/26/17 0731  BP: (!) 130/52 135/68 (!) 123/56 112/72  Pulse: 85 (!) 119 66 85  Resp: (!) 21  Temp:    98 F (36.7 C)  TempSrc:    Oral  SpO2: 93% 96% 98% 99%  Weight:      Height:        Intake/Output Summary (Last 24 hours) at 03/26/17 1115 Last data filed at 03/26/17 0733  Gross per 24 hour  Intake           2477.7 ml    Output             1440 ml  Net           1037.7 ml   Filed Weights   03/20/17 0150  Weight: 170 lb (77.1 kg)    Telemetry    Personally reviewed  afib with CVR x 48 hrs   ECG       Physical Exam   Well developed and nourished in no acute distress HENT normal Neck supple with JVP-flat Carotids brisk and full without bruits Clear Irregularly irregular rate and rhythm with controlled ventricular response, no murmurs or gallops Abd-soft with active BS without hepatomegaly No Clubbing cyanosis edema Skin-warm and dry A & Oriented  Grossly normal sensory and motor function   Labs    Chemistry Recent Labs Lab 03/20/17 0404  03/23/17 0428 03/24/17 0338 03/25/17 0410  NA 136  < > 134* 137 137  K 4.1  < > 3.9 3.9 3.9  CL 101  < > 101 102 103  CO2 24  < > GLUCOSE 239*  < > 148* 152* 156*  BUN 21*  < > 22* 21* 21*  CREATININE 0.98  < > 0.84 0.78 0.91  CALCIUM 8.9  < > 8.3* 8.4* 8.2*  PROT 6.6  --   --  5.8* 5.7*  ALBUMIN 3.5  --   --  2.9* 2.7*  AST 24  --   --  26 21  ALT 22  --   --  22 23  ALKPHOS 61  --   --  53 53  BILITOT 1.0  --   --  1.4* 1.7*  GFRNONAA 51*  < > >60 >60 56*  GFRAA 59*  < > >60 >60 >60  ANIONGAP 11  < > < > = values in this interval not displayed.   Hematology  Recent Labs Lab 03/24/17 0338 03/25/17 0410 03/26/17 0426  WBC 17.5* 10.4 18.4*  RBC 2.70* 2.48* 2.45*  HGB 8.1* 7.4* 7.4*  HCT 25.1* 23.2* 23.0*  MCV 93.0 93.5 93.9  MCH 30.0 29.8 30.2  MCHC 32.3 31.9 32.2  RDW 15.5 15.9* 15.9*  PLT 312 310 410*    Cardiac EnzymesNo results for input(s): TROPONINI in the last 168 hours. No results for input(s): TROPIPOC in the last 168 hours.   BNP  Recent Labs Lab 03/24/17 1148  BNP 373.3*     DDimer No results for input(s): DDIMER in the last 168 hours.   Radiology    Ct Head Wo Contrast  Result Date: 03/24/2017 CLINICAL DATA:  Altered level of consciousness. History of hypertension and diabetes. EXAM:  CT HEAD WITHOUT CONTRAST TECHNIQUE: Contiguous axial images were obtained from the base of the skull through the vertex without intravenous contrast. COMPARISON:  None. FINDINGS: BRAIN: No intraparenchymal hemorrhage, mass effect nor midline shift. The ventricles and sulci are normal for age. Patchy supratentorial white matter hypodensities less than expected for patient's age, though non-specific are most compatible with chronic small vessel ischemic disease. No acute large vascular territory infarcts. No abnormal extra-axial fluid collections. Basal cisterns are patent. VASCULAR: Moderate calcific atherosclerosis of the carotid siphons. SKULL: No skull fracture. Severe temporomandibular osteoarthrosis. Subcentimeter lucent lesion LEFT C2 body, incompletely evaluated. No significant scalp soft tissue swelling. SINUSES/ORBITS: The mastoid air-cells and included paranasal sinuses are well-aerated.The included ocular globes and orbital contents are non-suspicious. Status post RIGHT ocular lens implant. OTHER: None. IMPRESSION: 1. Negative noncontrast CT HEAD for age. 2. Partially imaged subcentimeter lucency C2 would be better characterized on dedicated CT if clinically indicated. Electronically Signed   By: Awilda Metro M.D.   On: 03/24/2017 18:59   Dg Chest Port 1 View  Result Date: 03/26/2017 CLINICAL DATA:  Congestive heart failure.  Diabetes. EXAM: PORTABLE CHEST 1 VIEW COMPARISON:  03/24/2017 FINDINGS: Right-sided central line tip: Cavoatrial junction. This is improved compared to the 03/24/2017 exam. Atherosclerotic calcification of the aortic arch. Mild enlargement of the cardiopericardial silhouette. Cephalization of blood flow. Indistinct bandlike opacity at the right lung base favoring atelectasis. IMPRESSION: 1. Mild enlargement of the cardiopericardial silhouette with cephalization of blood flow suggesting pulmonary venous hypertension. 2. Bandlike opacity at the right lung base favoring  atelectasis. 3. Right PICC line tip is at the cavoatrial junction. Electronically Signed   By: Gaylyn Rong M.D.   On: 03/26/2017 08:36   Dg Chest Port 1 View  Result Date: 03/24/2017 CLINICAL DATA:  Confirm line placement, Modus II research study. Head 45 degrees and arm 30 to 35 degrees. EXAM: PORTABLE CHEST 1 VIEW COMPARISON:  03/24/2017 FINDINGS: Patient has right-sided PICC line catheter, tip overlying the level of the right atrium. Consider withdrawing catheter approximately 2 cm  into the lower superior vena cava. No pneumothorax. Shallow lung inflation. Calibration ring overlies the left axillary region. IMPRESSION: Right-sided PICC line tip overlying the level of the right atrium. Electronically Signed   By: Norva Pavlov M.D.   On: 03/24/2017 15:46   Dg Chest Port 1 View  Result Date: 03/24/2017 CLINICAL DATA:  Wheezing today EXAM: PORTABLE CHEST 1 VIEW COMPARISON:  Portable chest x-ray of 03/23/2017 FINDINGS: Mild bibasilar atelectasis remains with suboptimal inspiration. No pneumonia or effusion is seen. Mediastinal and hilar contours are unchanged and mild cardiomegaly stable. Moderate thoracic aortic atherosclerosis is noted. IMPRESSION: 1. Suboptimal inspiration with mild bibasilar atelectasis. 2. Stable mild cardiomegaly. Electronically Signed   By: Dwyane Dee M.D.   On: 03/24/2017 11:57   Dg Abd Portable 1v  Result Date: 03/26/2017 CLINICAL DATA:  Generalized abdominal pain. EXAM: PORTABLE ABDOMEN - 1 VIEW COMPARISON:  One day prior FINDINGS: Supine views of the abdomen and pelvis. There is persistent moderate gastric distension. Gaseous distension of small bowel loops is improved, mild. Gas and stool identified within normal caliber colon. Osteopenia. Extensive pelvic fractures. IMPRESSION: Improved appearance of small bowel dilatation, favoring resolving adynamic ileus or low-grade partial small bowel obstruction. Persistent moderate gastric distention. Consider nasogastric  tube placement. Electronically Signed   By: Jeronimo Greaves M.D.   On: 03/26/2017 08:25   Dg Abd Portable 1v  Result Date: 03/25/2017 CLINICAL DATA:  Ileus, history diabetes mellitus, hypertension EXAM: PORTABLE ABDOMEN - 1 VIEW COMPARISON:  Portable exam 0633 hours compared to 03/24/2017 FINDINGS: Gas distention of stomach. Diffuse gaseous distention of small bowel loops throughout abdomen. Scattered gas and stool in colon. Stool in rectum. Findings are most consistent with ileus, unchanged. Bone severely demineralized with again identified LEFT superior pubic ramus fracture. IMPRESSION: Persistent ileus with increased gaseous distention of the stomach. Electronically Signed   By: Ulyses Southward M.D.   On: 03/25/2017 07:22    Cardiac Studies   2D echo 05/2016 Study Conclusions  - Left ventricle: The cavity size was normal. Wall thickness was normal. Systolic function was vigorous. The estimated ejection fraction was in the range of 65% to 70%. Wall motion was normal; there were no regional wall motion abnormalities. Features are consistent with a pseudonormal left ventricular filling pattern, with concomitant abnormal relaxation and increased filling pressure (grade 2 diastolic dysfunction). - Aortic valve: There was mild stenosis. - Mitral valve: Calcified annulus. Mildly calcified leaflets . There was mild to moderate regurgitation. - Left atrium: The atrium was severely dilated. - Tricuspid valve: There was moderate regurgitation. - Pulmonary arteries: PA peak pressure: 44 mm Hg (S).  Nuclear stress test 02/2017 Study Highlights     Nuclear stress EF: 60%.  The left ventricular ejection fraction is normal (55-65%).  There was no ST segment deviation noted during stress.  The study is normal.  This is a low risk study.  Low risk stress nuclear study with normal perfusion and normal left ventricular regional and global systolic function. Atrial fibrillation  during the study.       Patient Profile     83 y.o. female with ahistory of hypertension, diabetes and permanent atrial fibrillation. She had a myocardial perfusion scan done that showed an EF of around 60%. She had an admission at [facility] for a hypertensive crisis and EF was reported to be low there however EF in 2017 was 60-65%. She recently had been seen and her diltiazem had been increased because of increased ventricular response. He had  a negative myocardial perfusion scan with an EF of around 60% on August 4. She had a mechanical fall and was admitted with a pelvic fracture with a question of active bleeding noted on CT scan. She has become hypotensive and required fluid boluses. Her hemoglobin dropped from 12.3-8.9.    Assessment & Plan     Persistent atrial fibrillation - with ileus continue IV amio and Cardizem gtt at /hr - avoid BB for now due to wheezing - discontinue digoxin 0.125mg  IV  - will want to think about outpt DCCV  (4/18 ECG >>sinus )   Chronic diastolic CHF with hypertensive heart disease Volume status better but will give lasix x 1 with ongoing steroid use    Aortic atherosclerosis and coronary artery calcifications on chest CT - recent nuclear stress test showed no ischemia and normal LVF    For questions or updates, please contact [facility] Please consult [email or web address] for contact info under Cardiology/STEMI.      Signed, Sherryl Manges, MD  03/26/2017, 11:15 AM

## 2017-03-26 NOTE — Progress Notes (Signed)
-   Pt belching very loud - it smells like stool or BM - MD notified when she made rounds

## 2017-03-26 NOTE — Plan of Care (Signed)
Problem: Pain Managment: Goal: General experience of comfort will improve Outcome: Progressing Patient able to verbalize when she is having pain and pain measure effectiveness

## 2017-03-27 ENCOUNTER — Inpatient Hospital Stay (HOSPITAL_COMMUNITY): Payer: Medicare Other

## 2017-03-27 LAB — BASIC METABOLIC PANEL
ANION GAP: 7 (ref 5–15)
Anion gap: 7 (ref 5–15)
BUN: 25 mg/dL — AB (ref 6–20)
BUN: 30 mg/dL — AB (ref 6–20)
CHLORIDE: 100 mmol/L — AB (ref 101–111)
CO2: 27 mmol/L (ref 22–32)
CO2: 28 mmol/L (ref 22–32)
CREATININE: 0.7 mg/dL (ref 0.44–1.00)
Calcium: 7.9 mg/dL — ABNORMAL LOW (ref 8.9–10.3)
Calcium: 8.3 mg/dL — ABNORMAL LOW (ref 8.9–10.3)
Chloride: 102 mmol/L (ref 101–111)
Creatinine, Ser: 0.8 mg/dL (ref 0.44–1.00)
GFR calc Af Amer: >60 mL/min (ref >60)
GLUCOSE: 176 mg/dL — AB (ref 65–99)
Glucose, Bld: 140 mg/dL — ABNORMAL HIGH (ref 65–99)
POTASSIUM: 3.9 mmol/L (ref 3.5–5.1)
Potassium: 3.6 mmol/L (ref 3.5–5.1)
SODIUM: 136 mmol/L (ref 135–145)
SODIUM: 135 mmol/L (ref 135–145)

## 2017-03-27 LAB — CBC WITH DIFFERENTIAL/PLATELET
Basophils Absolute: 0 10*3/uL (ref 0.0–0.1)
Basophils Relative: 0 %
EOS ABS: 0 10*3/uL (ref 0.0–0.7)
EOS PCT: 0 %
HCT: 24.1 % — ABNORMAL LOW (ref 36.0–46.0)
Hemoglobin: 7.7 g/dL — ABNORMAL LOW (ref 12.0–15.0)
Lymphocytes Relative: 3 %
Lymphs Abs: 0.4 10*3/uL — ABNORMAL LOW (ref 0.7–4.0)
MCH: 30.3 pg (ref 26.0–34.0)
MCHC: 32 g/dL (ref 30.0–36.0)
MCV: 94.9 fL (ref 78.0–100.0)
MONO ABS: 1.7 10*3/uL — AB (ref 0.1–1.0)
MONOS PCT: 12 %
Neutro Abs: 11.9 10*3/uL — ABNORMAL HIGH (ref 1.7–7.7)
Neutrophils Relative %: 85 %
PLATELETS: 454 10*3/uL — AB (ref 150–400)
RBC: 2.54 MIL/uL — ABNORMAL LOW (ref 3.87–5.11)
RDW: 16.3 % — AB (ref 11.5–15.5)
WBC: 14 10*3/uL — AB (ref 4.0–10.5)

## 2017-03-27 LAB — GLUCOSE, CAPILLARY
GLUCOSE-CAPILLARY: 146 mg/dL — AB (ref 65–99)
GLUCOSE-CAPILLARY: 139 mg/dL — AB (ref 65–99)
GLUCOSE-CAPILLARY: 134 mg/dL — AB (ref 65–99)
Glucose-Capillary: 144 mg/dL — ABNORMAL HIGH (ref 65–99)
Glucose-Capillary: 135 mg/dL — ABNORMAL HIGH (ref 65–99)

## 2017-03-27 LAB — MAGNESIUM: MAGNESIUM: 2 mg/dL (ref 1.7–2.4)

## 2017-03-27 LAB — PREPARE RBC (CROSSMATCH)

## 2017-03-27 IMAGING — DX DG ABDOMEN 1V
1 series · 1 of 1 positions shown · non-contrast
Comparison: 03/26/2017

CLINICAL DATA: Abdominal pain

EXAM:
ABDOMEN - 1 VIEW

[abdomen kub]
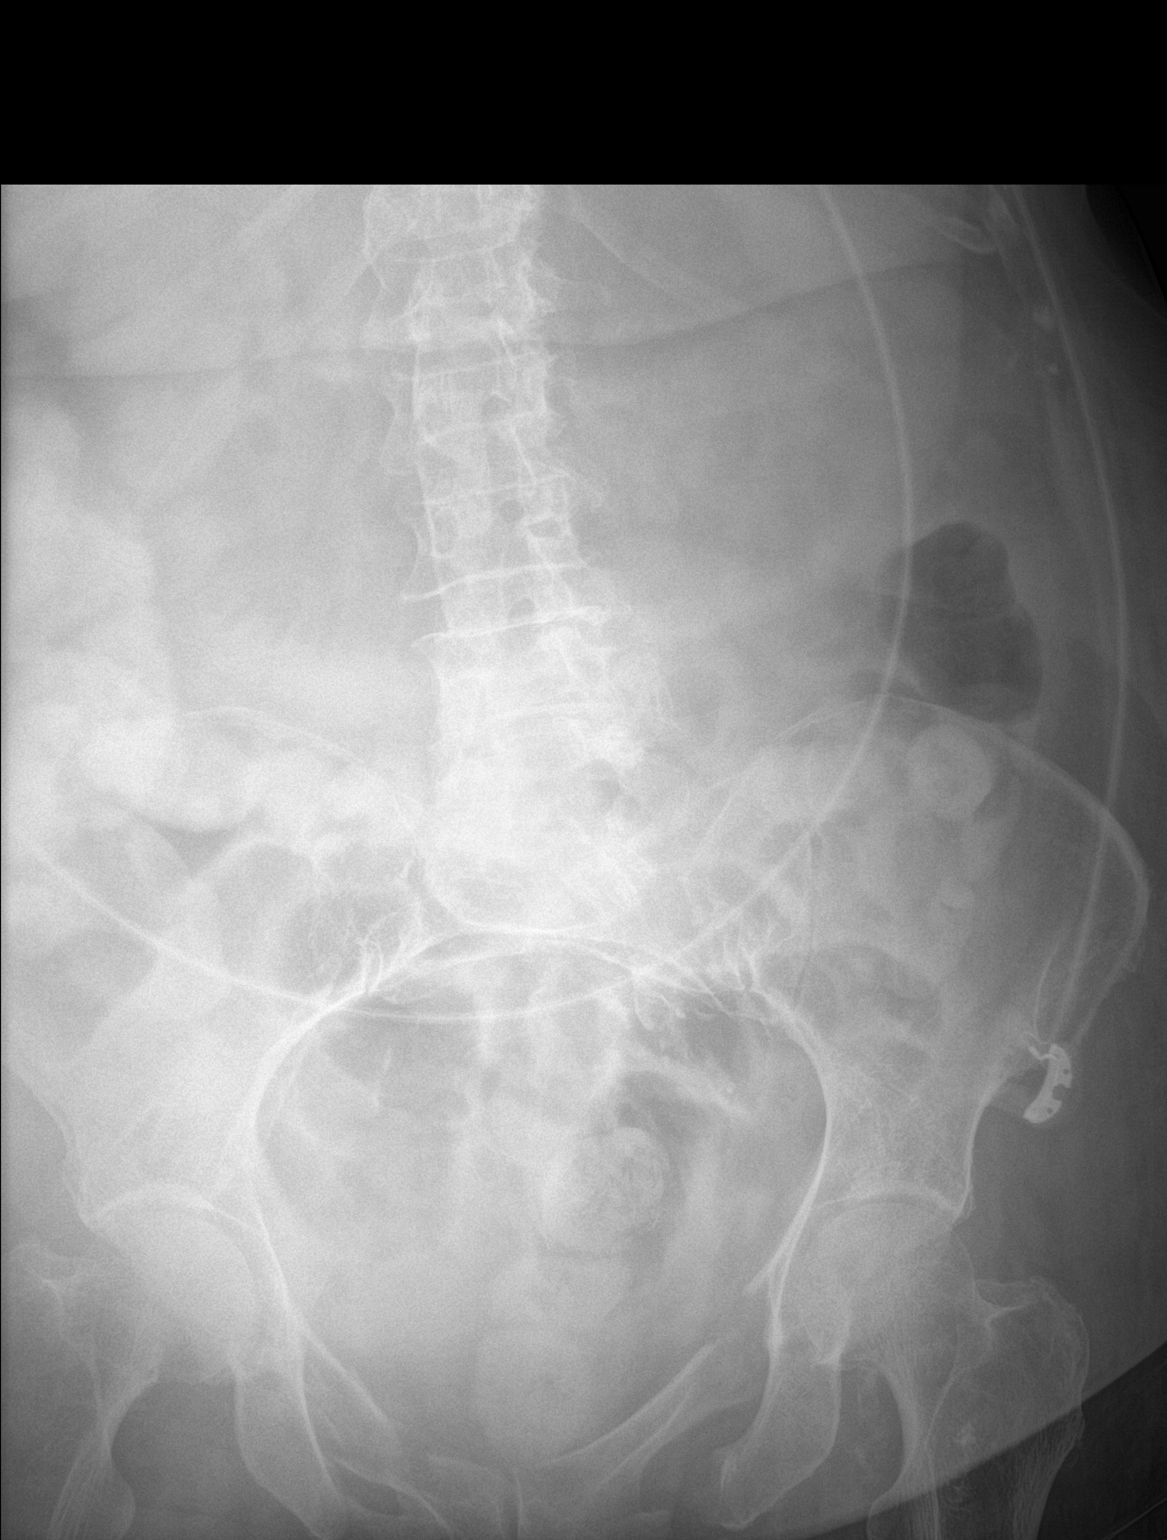

[1 of 1 positions shown; findings below may reference images not displayed]

FINDINGS: The dilute contrast medium in the colon. No visible dilated bowel.
Gastric distention. Gas in indistinct loops of pelvic bowel.

Fractures of the bilateral pubic rami are again observed. Patient
also has a known left sacral fracture and left L5 transverse process
fracture.
IMPRESSION: 1. There is gas in indistinct loops of pelvic bowel which, if within
small bowel, might be borderline dilated. No overtly dilated bowel
observed.
2. Dilute contrast medium in the colon.
3. Pelvic fractures.

## 2017-03-27 MED ORDER — SODIUM CHLORIDE 0.9 % IV SOLN
Freq: Once | INTRAVENOUS | Status: AC
  Administered 2017-03-27: 03:00:00 via INTRAVENOUS

## 2017-03-27 MED ORDER — KETOROLAC TROMETHAMINE 15 MG/ML IJ SOLN
15.0000 mg | Freq: Two times a day (BID) | INTRAMUSCULAR | Status: DC | PRN
  Administered 2017-03-27 – 2017-03-31 (×5): 15 mg via INTRAVENOUS
  Filled 2017-03-27 (×5): qty 1

## 2017-03-27 MED ORDER — SULFAMETHOXAZOLE-TRIMETHOPRIM 400-80 MG PO TABS
1.0000 | ORAL_TABLET | Freq: Two times a day (BID) | ORAL | Status: DC
  Administered 2017-03-27: 1 via ORAL
  Filled 2017-03-27 (×3): qty 1

## 2017-03-27 MED ORDER — METHYLPREDNISOLONE SODIUM SUCC 40 MG IJ SOLR
40.0000 mg | Freq: Every day | INTRAMUSCULAR | Status: DC
  Administered 2017-03-28: 40 mg via INTRAVENOUS
  Filled 2017-03-27: qty 1

## 2017-03-27 NOTE — Plan of Care (Signed)
Problem: Skin Integrity: Goal: Risk for impaired skin integrity will decrease Outcome: Progressing No change in breakdown or new s/s of skin problems. Continuing to turn patient q 2 hours and encouraging repositiong

## 2017-03-27 NOTE — Progress Notes (Signed)
Progress Note  Patient Name: Tracey Porter Date of Encounter: 03/27/2017  Primary Cardiologist: Dr. Tresa Endo   Patient Profile     83 y.o. female with ahistory of hypertension, diabetes and persistent atrial fibrillation (chart describes permanent afib BUT was in sinus 4/18) . Nl EF Echo 2017, neg myoview 2018 Admitted 9/8 following fall with pelvic fracture;  cx by ileus  Consult 9/9 for AFib RVR and hypotension and concern for pelvic bleeding 2/2 trauma Ongoing anemia  Rx with amio for rate control with added Dilt/dig     Subjective  Very tired Without palpitations Pelvis still sore   Inpatient Medications    Scheduled Meds: . acyclovir  800 mg Oral BID  . apixaban  5 mg Oral BID  . atorvastatin  40 mg Oral Daily  . Chlorhexidine Gluconate Cloth  6 each Topical Daily  . insulin aspart  0-9 Units Subcutaneous Q4H  . [START ON 03/28/2017] methylPREDNISolone (SOLU-MEDROL) injection  40 mg Intravenous Daily  . mirabegron ER  25 mg Oral Daily  . multivitamin with minerals  1 tablet Oral Daily  . pantoprazole  40 mg Oral Daily  . prednisoLONE acetate  1 drop Right Eye BID  . senna  1 tablet Oral BID  . sodium chloride flush  10-40 mL Intracatheter Q12H   Continuous Infusions: . sodium chloride 75 mL/hr at 03/27/17 0418  . sodium chloride    . amiodarone 30 mg/hr (03/26/17 2337)  . diltiazem (CARDIZEM) infusion 15 mg/hr (03/27/17 0418)   PRN Meds: acetaminophen, albuterol, bisacodyl, ketorolac, lactulose, ondansetron **OR** ondansetron (ZOFRAN) IV, sodium chloride flush   Vital Signs    Vitals:   03/27/17 0800 03/27/17 0818 03/27/17 0900 03/27/17 1000  BP: (!) 124/58 (!) 124/58 103/85 129/67  Pulse: 88 79 (!) 105 96  Resp: (!) 21 (!) Temp:  97.8 F (36.6 C)    TempSrc:  Oral    SpO2: 98% 100% 99% 100%  Weight:      Height:        Intake/Output Summary (Last 24 hours) at 03/27/17 1100 Last data filed at 03/27/17 0418  Gross per 24 hour    Intake          2421.23 ml  Output             2045 ml  Net           376.23 ml   Filed Weights   03/20/17 0150  Weight: 170 lb (77.1 kg)    Telemetry    Personally reviewed Afib with CVR  ECG       Physical Exam    Well developed and nourished in mild discomfort HENT normal Neck supple  Clear Irregularly irregular rate and rhythm with controlled ventricular response, no murmurs or gallops Abd-soft with active BS without hepatomegaly No Clubbing cyanosis edema Skin-warm and dry A & Oriented  Grossly normal sensory and motor function   Labs    Chemistry  Recent Labs Lab 03/24/17 0338 03/25/17 0410 03/27/17 0602  NA 137 137 136  K 3.9 3.9 3.6  CL 102 103 102  CO2 GLUCOSE 152* 156* 176*  BUN 21* 21* 25*  CREATININE 0.78 0.91 0.70  CALCIUM 8.4* 8.2* 7.9*  PROT 5.8* 5.7*  --   ALBUMIN 2.9* 2.7*  --   AST 26 21  --   ALT 22 23  --   ALKPHOS 53 53  --   BILITOT 1.4* 1.7*  --  GFRNONAA >60 56* >60  GFRAA >60 >60 >60  ANIONGAP Hematology  Recent Labs Lab 03/25/17 0410 03/26/17 0426 03/27/17 0602  WBC 10.4 18.4* 14.0*  RBC 2.48* 2.45* 2.54*  HGB 7.4* 7.4* 7.7*  HCT 23.2* 23.0* 24.1*  MCV 93.5 93.9 94.9  MCH 29.8 30.2 30.3  MCHC 31.9 32.2 32.0  RDW 15.9* 15.9* 16.3*  PLT 310 410* 454*    Cardiac EnzymesNo results for input(s): TROPONINI in the last 168 hours. No results for input(s): TROPIPOC in the last 168 hours.   BNP  Recent Labs Lab 03/24/17 1148  BNP 373.3*     DDimer No results for input(s): DDIMER in the last 168 hours.   Radiology    Dg Chest Port 1 View  Result Date: 03/26/2017 CLINICAL DATA:  Congestive heart failure.  Diabetes. EXAM: PORTABLE CHEST 1 VIEW COMPARISON:  03/24/2017 FINDINGS: Right-sided central line tip: Cavoatrial junction. This is improved compared to the 03/24/2017 exam. Atherosclerotic calcification of the aortic arch. Mild enlargement of the cardiopericardial silhouette. Cephalization  of blood flow. Indistinct bandlike opacity at the right lung base favoring atelectasis. IMPRESSION: 1. Mild enlargement of the cardiopericardial silhouette with cephalization of blood flow suggesting pulmonary venous hypertension. 2. Bandlike opacity at the right lung base favoring atelectasis. 3. Right PICC line tip is at the cavoatrial junction. Electronically Signed   By: Gaylyn Rong M.D.   On: 03/26/2017 08:36   Dg Abd Portable 1v  Result Date: 03/26/2017 CLINICAL DATA:  Generalized abdominal pain. EXAM: PORTABLE ABDOMEN - 1 VIEW COMPARISON:  One day prior FINDINGS: Supine views of the abdomen and pelvis. There is persistent moderate gastric distension. Gaseous distension of small bowel loops is improved, mild. Gas and stool identified within normal caliber colon. Osteopenia. Extensive pelvic fractures. IMPRESSION: Improved appearance of small bowel dilatation, favoring resolving adynamic ileus or low-grade partial small bowel obstruction. Persistent moderate gastric distention. Consider nasogastric tube placement. Electronically Signed   By: Jeronimo Greaves M.D.   On: 03/26/2017 08:25    Cardiac Studies   2D echo 05/2016 Study Conclusions  - Left ventricle: The cavity size was normal. Wall thickness was normal. Systolic function was vigorous. The estimated ejection fraction was in the range of 65% to 70%. Wall motion was normal; there were no regional wall motion abnormalities. Features are consistent with a pseudonormal left ventricular filling pattern, with concomitant abnormal relaxation and increased filling pressure (grade 2 diastolic dysfunction). - Aortic valve: There was mild stenosis. - Mitral valve: Calcified annulus. Mildly calcified leaflets . There was mild to moderate regurgitation. - Left atrium: The atrium was severely dilated. - Tricuspid valve: There was moderate regurgitation. - Pulmonary arteries: PA peak pressure: 44 mm Hg (S).  Nuclear stress  test 02/2017 Study Highlights     Nuclear stress EF: 60%.  The left ventricular ejection fraction is normal (55-65%).  There was no ST segment deviation noted during stress.  The study is normal.  This is a low risk study.  Low risk stress nuclear study with normal perfusion and normal left ventricular regional and global systolic function. Atrial fibrillation during the study.       Patient Profile     83 y.o. female with ahistory of hypertension, diabetes and permanent atrial fibrillation. She had a myocardial perfusion scan done that showed an EF of around 60%. She had an admission at [facility] for a hypertensive crisis and EF was reported to be low there however EF  in 2017 was 60-65%. She recently had been seen and her diltiazem had been increased because of increased ventricular response. He had a negative myocardial perfusion scan with an EF of around 60% on August 4. She had a mechanical fall and was admitted with a pelvic fracture with a question of active bleeding noted on CT scan. She has become hypotensive and required fluid boluses. Her hemoglobin dropped from 12.3-8.9.    Assessment & Plan     Persistent atrial fibrillation -  HR a little faster off dig - will want to think about outpt DCCV  (4/18 ECG >>sinus )   Chronic diastolic CHF with hypertensive heart disease Volume status better but will give lasix x 1 with ongoing steroid use   aortic atherosclerosis and coronary artery calcifications on chest CT - recent nuclear stress test showed no ischemia and normal LVF Anemia plan is to transfuse according to family   Ileus  Precludes oral rate control   Repeat Abd XR pending     For questions or updates, please contact [facility] Please consult [email or web address] for contact info under Cardiology/STEMI.      Signed, Sherryl Manges, MD  03/27/2017, 11:00 AM

## 2017-03-27 NOTE — Progress Notes (Signed)
PROGRESS NOTE    Tracey Porter  ZOX:[id number] DOB: 06/03/1930 DOA: 03/19/2017 PCP: [place], Julie M, NP     Brief Narrative:  83 y.o. female PMHx Chronic Diastolic CHF, Pulmonary HTN, HTN, COPD DM type 2, A.fib with RVR on Eliquis, HLD, Mild dementia, Macular degeneration   Presented S/P multiple mechanical fall , negative LOC--> closed pelvic fracture nonsurgical. Now complicated with ileus, COPD exacerbation. Patient had a CT scan of her head without contrast which did not reveal anything acute. VQ scan was low probability for pulmonary embolism. She was started on digoxin yesterday along with Cardizem and amiodarone. Her heart rate seems to be better today less than 100. Patient is more awake less confused. Daughter by the bedside.she continues to have ileus.she worked with PT yesterday      Diabetes well controled last HgA1c 5.8 in July 2018 on metformin    Subjective: 9/16  A/O 4, states negative CP, negative SOB, negative abdominal pain, negative N/V. Tolerating clear liquid diet. Daughter states has had several BMs since admission. Patient states pain mostly in her buttocks on left side. Able to stand for approximately 30 seconds and then sits secondary to fatigue.     Assessment & Plan:   Principal Problem:   Multiple fractures of pelvis with unstable disruption of pelvic ring, initial encounter for closed fracture (HCC) Active Problems:   DM2 (diabetes mellitus, type 2) (HCC)   Paroxysmal SVT (supraventricular tachycardia) (HCC)   Essential hypertension   Closed pelvic fracture (HCC)   Leukocytosis   Fall at home, initial encounter   Atrial fibrillation with RVR (HCC)   Aortic atherosclerosis (HCC)   Chronic atrial fibrillation (HCC)   Hypotension due to blood loss   Coagulopathy (HCC)   Chronic diastolic heart failure (HCC)  Pelvic fractures (multiple) -CT pelvis 9/8 shows multiple pelvic fractures with possible bleeding see results below -Per Dr. Caryn Bee  [facility] trauma surgery Nonsurgical treatment. Cleared to restart Eliquis. Follow-up in clinic in 1-2 weeks for repeat pelvic x-ray -Tylenol. Would limit narcotics secondary to patient's ileus. Toradol PRN  ILEUS.. -CT abdomen pelvis 9/11 C/W ileus see results below -KUB pending, ileus appears to be resolving see results below. Advance diet to full liquid  -Out of bed to chair q shift  positive Escherichia coli UTI -Bactrium 5 days: Allergic to penicillin  COPD exacerbation -Not on home O2 -Titrate O2 to maintain SPO2 89- 93% -9/16 decrease Solu-Medrol to 40 mg daily: Rapid wean   Chronic diastolic CHF -Strict in and out -Daily weight -Transfuse for Hg<8 -9/9 transfused 2 units PRBC -9/16 transfuse 1 unit PRBC -Amiodarone drip, as soon as patient can tolerate will transition to PO  -Diltiazem drip as soon as patient can tolerate transition to  PO   Pulmonary HTN -See CHF -Eliquis 5 mg BID  A. fib with RVR -See CHF  Corneal transplant -Continue Acyclovir     Goals of care -PT/OT: Recommends SNF. Per LCSW Jenna  note on 9/12 daughter's choices are [facility], [facility], or [facility]    DVT prophylaxis: Eliquis Code Status: Full Family Communication: Daughter at bedside discussed plan of care Disposition Plan: Discharge next 48 hours   Consultants:  Orthopedic Trauma surgery    Procedures/Significant Events:  9/8 CT pelvis W contrast:-Displaced comminuted fractures of the left superior inferior pubic ramus, superior ramus fracture at the puboacetabular junction. -Displaced left sacral fracture extending into the sacroiliac joint. -Left L5 transverse process fracture. -Displaced right superior and inferior pubic rami fractures. No definite  right sacral fracture. - Pelvic hemorrhage related to pelvic fractures,>>Lt. Questionable active bleeding within the left pelvic hemorrhage. 9/9 transfused 2 units PRBC 9/11 CT abdomen pelvis Wo contrast:-Findings C/W ileus  or small bowel dysmotility. - Slightly smaller right pelvic sidewall extraperitoneal hematoma. Measures 9.9 x 7.2 cm versus 12 x 8 cm on axial images. -Small right pleural effusion. -Cardiomegaly with coronary arteriosclerosis. 9/12 VQ scan: Low probability PE 9/13 CT head W contrast: Negative acute infarct -Severe temporomandibular osteoarthrosis 9/16 KUB:-gas in indistinct loops of pelvic bowel which, if within small bowel, might be borderline dilated. No overtly dilated bowel observed.      I have personally reviewed and interpreted all radiology studies and my findings are as above.  VENTILATOR SETTINGS: None   Cultures 9/9 MRSA by PCR negative 9/13 urine positive Escherichia coli  Antimicrobials:Anti-infectives    Start     Dose/Rate Stop   03/27/17 2200  sulfamethoxazole-trimethoprim (BACTRIM,SEPTRA) 400-80 MG per tablet 1 tablet     1 tablet     03/25/17 1745  cefTRIAXone (ROCEPHIN) 1 g in dextrose 5 % 50 mL IVPB  Status:  Discontinued     1 g 100 mL/hr over 30 Minutes 03/25/17 1734   03/20/17 0230  acyclovir (ZOVIRAX) tablet 800 mg     800 mg         Devices    LINES / TUBES:      Continuous Infusions: . sodium chloride 75 mL/hr at 03/27/17 0418  . amiodarone 30 mg/hr (03/26/17 2337)  . diltiazem (CARDIZEM) infusion 15 mg/hr (03/27/17 0418)     Objective: Vitals:   03/27/17 0306 03/27/17 0400 03/27/17 0500 03/27/17 0600  BP: (!) 145/43 (!) 104/58 (!) 138/53 109/63  Pulse: 87 92 84 96  Resp: 19 (!) 22 (!) 23 20  Temp: 97.8 F (36.6 C)     TempSrc: Oral     SpO2: 96% 98% 98% 98%  Weight:      Height:         Intake/Output Summary (Last 24 hours) at 03/27/17 0701 Last data filed at 03/27/17 0418  Gross per 24 hour  Intake           2752.9 ml  Output             2345 ml  Net            407.9 ml   Filed Weights   03/20/17 0150  Weight: 170 lb (77.1 kg)    Examination:  General: A/O 4, No acute respiratory distress Eyes: negative  scleral hemorrhage, negative anisocoria, negative icterus Lungs: Clear to auscultation bilaterally without wheezes or crackles Cardiovascular: Regular rate and rhythm without murmur gallop or rub normal S1 and S2 Abdomen: negative abdominal pain, nondistended, positive soft, bowel sounds, no rebound, no ascites, no appreciable mass Extremities: No significant cyanosis, clubbing, or edema bilateral lower extremities Skin: Negative rashes, lesions, ulcers Psychiatric:  Negative depression, negative anxiety, negative fatigue, negative mania  Central nervous system:  Cranial nerves II through XII intact, tongue/uvula midline, all extremities muscle strength 5/5, sensation intact throughout,  negative dysarthria, negative expressive aphasia, negative receptive aphasia.  .     Data Reviewed: Care during the described time interval was provided by me .  I have reviewed this patient's available data, including medical history, events of note, physical examination, and all test results as part of my evaluation.  CBC:  Recent Labs Lab 03/23/17 0428 03/24/17 0338 03/25/17 0410 03/26/17 0426 03/27/17 0602  WBC  23.0* 17.5* 10.4 18.4* 14.0*  NEUTROABS 19.6* 14.2* 9.4* 16.5* 11.9*  HGB 8.5* 8.1* 7.4* 7.4* 7.7*  HCT 25.6* 25.1* 23.2* 23.0* 24.1*  MCV 91.1 93.0 93.5 93.9 94.9  PLT 253 312 310 410* 454*   Basic Metabolic Panel:  Recent Labs Lab 03/22/17 1345 03/23/17 0428 03/24/17 0338 03/25/17 0410 03/27/17 0602  NA 135 134* 137 137 136  K 4.1 3.9 3.9 3.9 3.6  CL 105 101 102 103 102  CO2 GLUCOSE 158* 148* 152* 156* 176*  BUN 25* 22* 21* 21* 25*  CREATININE 1.07* 0.84 0.78 0.91 0.70  CALCIUM 7.9* 8.3* 8.4* 8.2* 7.9*   GFR: Estimated Creatinine Clearance: 53.9 mL/min (by C-G formula based on SCr of 0.7 mg/dL). Liver Function Tests:  Recent Labs Lab 03/24/17 0338 03/25/17 0410  AST 26 21  ALT 22 23  ALKPHOS 53 53  BILITOT 1.4* 1.7*  PROT 5.8* 5.7*  ALBUMIN  2.9* 2.7*   No results for input(s): LIPASE, AMYLASE in the last 168 hours. No results for input(s): AMMONIA in the last 168 hours. Coagulation Profile: No results for input(s): INR, PROTIME in the last 168 hours. Cardiac Enzymes: No results for input(s): CKTOTAL, CKMB, CKMBINDEX, TROPONINI in the last 168 hours. BNP (last 3 results) No results for input(s): PROBNP in the last 8760 hours. HbA1C: No results for input(s): HGBA1C in the last 72 hours. CBG:  Recent Labs Lab 03/26/17 1607 03/26/17 1927 03/26/17 2054 03/26/17 2327 03/27/17 0353  GLUCAP 165* 161* 142* 162* 146*   Lipid Profile: No results for input(s): CHOL, HDL, LDLCALC, TRIG, CHOLHDL, LDLDIRECT in the last 72 hours. Thyroid Function Tests:  Recent Labs  03/25/17 0800  TSH 0.351   Anemia Panel: No results for input(s): VITAMINB12, FOLATE, FERRITIN, TIBC, IRON, RETICCTPCT in the last 72 hours. Sepsis Labs: No results for input(s): PROCALCITON, LATICACIDVEN in the last 168 hours.  Recent Results (from the past 240 hour(s))  Surgical pcr screen     Status: None   Collection Time: 03/20/17  1:50 AM  Result Value Ref Range Status   MRSA, PCR NEGATIVE NEGATIVE Final   Staphylococcus aureus NEGATIVE NEGATIVE Final    Comment: (NOTE) The Xpert SA Assay (FDA approved for NASAL specimens in patients 10 years of age and older), is one component of a comprehensive surveillance program. It is not intended to diagnose infection nor to guide or monitor treatment.   Culture, Urine     Status: Abnormal   Collection Time: 03/24/17 11:29 AM  Result Value Ref Range Status   Specimen Description URINE, CATHETERIZED  Final   Special Requests NONE  Final   Culture >=100,000 COLONIES/mL ESCHERICHIA COLI (A)  Final   Report Status 03/26/2017 FINAL  Final   Organism ID, Bacteria ESCHERICHIA COLI (A)  Final      Susceptibility   Escherichia coli - MIC*    AMPICILLIN 4 SENSITIVE Sensitive     CEFAZOLIN <=4 SENSITIVE  Sensitive     CEFTRIAXONE <=1 SENSITIVE Sensitive     CIPROFLOXACIN <=0.25 SENSITIVE Sensitive     GENTAMICIN <=1 SENSITIVE Sensitive     IMIPENEM <=0.25 SENSITIVE Sensitive     NITROFURANTOIN <=16 SENSITIVE Sensitive     TRIMETH/SULFA <=20 SENSITIVE Sensitive     AMPICILLIN/SULBACTAM <=2 SENSITIVE Sensitive     PIP/TAZO <=4 SENSITIVE Sensitive     Extended ESBL NEGATIVE Sensitive     * >=100,000 COLONIES/mL ESCHERICHIA COLI  Radiology Studies: Dg Chest Port 1 View  Result Date: 03/26/2017 CLINICAL DATA:  Congestive heart failure.  Diabetes. EXAM: PORTABLE CHEST 1 VIEW COMPARISON:  03/24/2017 FINDINGS: Right-sided central line tip: Cavoatrial junction. This is improved compared to the 03/24/2017 exam. Atherosclerotic calcification of the aortic arch. Mild enlargement of the cardiopericardial silhouette. Cephalization of blood flow. Indistinct bandlike opacity at the right lung base favoring atelectasis. IMPRESSION: 1. Mild enlargement of the cardiopericardial silhouette with cephalization of blood flow suggesting pulmonary venous hypertension. 2. Bandlike opacity at the right lung base favoring atelectasis. 3. Right PICC line tip is at the cavoatrial junction. Electronically Signed   By: Gaylyn Rong M.D.   On: 03/26/2017 08:36   Dg Abd Portable 1v  Result Date: 03/26/2017 CLINICAL DATA:  Generalized abdominal pain. EXAM: PORTABLE ABDOMEN - 1 VIEW COMPARISON:  One day prior FINDINGS: Supine views of the abdomen and pelvis. There is persistent moderate gastric distension. Gaseous distension of small bowel loops is improved, mild. Gas and stool identified within normal caliber colon. Osteopenia. Extensive pelvic fractures. IMPRESSION: Improved appearance of small bowel dilatation, favoring resolving adynamic ileus or low-grade partial small bowel obstruction. Persistent moderate gastric distention. Consider nasogastric tube placement. Electronically Signed   By: Jeronimo Greaves M.D.    On: 03/26/2017 08:25        Scheduled Meds: . acyclovir  800 mg Oral BID  . apixaban  5 mg Oral BID  . atorvastatin  40 mg Oral Daily  . Chlorhexidine Gluconate Cloth  6 each Topical Daily  . insulin aspart  0-9 Units Subcutaneous Q4H  . methylPREDNISolone (SOLU-MEDROL) injection  40 mg Intravenous Q12H  . mirabegron ER  25 mg Oral Daily  . multivitamin with minerals  1 tablet Oral Daily  . pantoprazole  40 mg Oral Daily  . prednisoLONE acetate  1 drop Right Eye BID  . senna  1 tablet Oral BID  . sodium chloride flush  10-40 mL Intracatheter Q12H   Continuous Infusions: . sodium chloride 75 mL/hr at 03/27/17 0418  . amiodarone 30 mg/hr (03/26/17 2337)  . diltiazem (CARDIZEM) infusion 15 mg/hr (03/27/17 0418)     LOS: 7 days    Time spent:40 min    WOODS, Roselind Messier, MD [facility] Pager [phone]  If 7PM-7AM, please contact night-coverage [email or web address] Password [email or web address] 03/27/2017, 7:01 AM

## 2017-03-28 ENCOUNTER — Encounter (HOSPITAL_COMMUNITY): Payer: Self-pay | Admitting: Family Medicine

## 2017-03-28 DIAGNOSIS — Y92009 Unspecified place in unspecified non-institutional (private) residence as the place of occurrence of the external cause: Secondary | ICD-10-CM

## 2017-03-28 DIAGNOSIS — K567 Ileus, unspecified: Secondary | ICD-10-CM

## 2017-03-28 DIAGNOSIS — E119 Type 2 diabetes mellitus without complications: Secondary | ICD-10-CM

## 2017-03-28 DIAGNOSIS — I482 Chronic atrial fibrillation: Secondary | ICD-10-CM

## 2017-03-28 DIAGNOSIS — Z794 Long term (current) use of insulin: Secondary | ICD-10-CM

## 2017-03-28 DIAGNOSIS — W19XXXA Unspecified fall, initial encounter: Secondary | ICD-10-CM

## 2017-03-28 LAB — CBC WITH DIFFERENTIAL/PLATELET
Basophils Absolute: 0 10*3/uL (ref 0.0–0.1)
Basophils Relative: 0 %
EOS PCT: 0 %
Eosinophils Absolute: 0 10*3/uL (ref 0.0–0.7)
HCT: 25.4 % — ABNORMAL LOW (ref 36.0–46.0)
Hemoglobin: 8.1 g/dL — ABNORMAL LOW (ref 12.0–15.0)
LYMPHS ABS: 0.7 10*3/uL (ref 0.7–4.0)
LYMPHS PCT: 6 %
MCH: 29.1 pg (ref 26.0–34.0)
MCHC: 31.9 g/dL (ref 30.0–36.0)
MCV: 91.4 fL (ref 78.0–100.0)
MONO ABS: 1.3 10*3/uL — AB (ref 0.1–1.0)
MONOS PCT: 11 %
Neutro Abs: 9.6 10*3/uL — ABNORMAL HIGH (ref 1.7–7.7)
Neutrophils Relative %: 83 %
PLATELETS: 387 10*3/uL (ref 150–400)
RBC: 2.78 MIL/uL — AB (ref 3.87–5.11)
RDW: 18.2 % — ABNORMAL HIGH (ref 11.5–15.5)
WBC: 11.6 10*3/uL — AB (ref 4.0–10.5)

## 2017-03-28 LAB — TYPE AND SCREEN
ABO/RH(D): A POS
ANTIBODY SCREEN: NEGATIVE
UNIT DIVISION: 0

## 2017-03-28 LAB — BASIC METABOLIC PANEL
Anion gap: 6 (ref 5–15)
BUN: 28 mg/dL — AB (ref 6–20)
CO2: 26 mmol/L (ref 22–32)
CREATININE: 0.69 mg/dL (ref 0.44–1.00)
Calcium: 7.5 mg/dL — ABNORMAL LOW (ref 8.9–10.3)
Chloride: 104 mmol/L (ref 101–111)
GFR calc Af Amer: >60 mL/min (ref >60)
Glucose, Bld: 177 mg/dL — ABNORMAL HIGH (ref 65–99)
POTASSIUM: 3 mmol/L — AB (ref 3.5–5.1)
Sodium: 136 mmol/L (ref 135–145)

## 2017-03-28 LAB — BPAM RBC
Blood Product Expiration Date: 201809172359
ISSUE DATE / TIME: 201809161245
UNIT TYPE AND RH: 600

## 2017-03-28 LAB — GLUCOSE, CAPILLARY
GLUCOSE-CAPILLARY: 120 mg/dL — AB (ref 65–99)
GLUCOSE-CAPILLARY: 150 mg/dL — AB (ref 65–99)
Glucose-Capillary: 109 mg/dL — ABNORMAL HIGH (ref 65–99)
Glucose-Capillary: 116 mg/dL — ABNORMAL HIGH (ref 65–99)
Glucose-Capillary: 124 mg/dL — ABNORMAL HIGH (ref 65–99)
Glucose-Capillary: 157 mg/dL — ABNORMAL HIGH (ref 65–99)

## 2017-03-28 MED ORDER — METOPROLOL TARTRATE 25 MG PO TABS
25.0000 mg | ORAL_TABLET | Freq: Two times a day (BID) | ORAL | Status: DC
  Administered 2017-03-28 – 2017-03-29 (×3): 25 mg via ORAL
  Filled 2017-03-28 (×3): qty 1

## 2017-03-28 MED ORDER — SULFAMETHOXAZOLE-TRIMETHOPRIM 800-160 MG PO TABS
1.0000 | ORAL_TABLET | Freq: Two times a day (BID) | ORAL | Status: DC
  Administered 2017-03-28 – 2017-03-31 (×7): 1 via ORAL
  Filled 2017-03-28 (×7): qty 1

## 2017-03-28 MED ORDER — TRAZODONE HCL 50 MG PO TABS
50.0000 mg | ORAL_TABLET | Freq: Once | ORAL | Status: AC
  Administered 2017-03-28: 50 mg via ORAL
  Filled 2017-03-28: qty 1

## 2017-03-28 MED ORDER — POTASSIUM CHLORIDE CRYS ER 20 MEQ PO TBCR
40.0000 meq | EXTENDED_RELEASE_TABLET | Freq: Four times a day (QID) | ORAL | Status: AC
  Administered 2017-03-28 (×2): 40 meq via ORAL
  Filled 2017-03-28 (×2): qty 2

## 2017-03-28 MED ORDER — PREDNISONE 20 MG PO TABS
40.0000 mg | ORAL_TABLET | Freq: Every day | ORAL | Status: DC
  Administered 2017-03-29 – 2017-03-31 (×3): 40 mg via ORAL
  Filled 2017-03-28 (×3): qty 2

## 2017-03-28 MED ORDER — LEVALBUTEROL HCL 0.63 MG/3ML IN NEBU: 0.6300 mg | INHALATION_SOLUTION | Freq: Four times a day (QID) | RESPIRATORY_TRACT | Status: DC | PRN

## 2017-03-28 MED ORDER — POTASSIUM CHLORIDE IN NACL 40-0.9 MEQ/L-% IV SOLN
INTRAVENOUS | Status: DC
Start: 2017-03-28 — End: 2017-03-28
  Filled 2017-03-28: qty 1000

## 2017-03-28 NOTE — Progress Notes (Signed)
Progress Note  Patient Name: Tracey Porter Date of Encounter: 03/28/2017  Primary Cardiologist: Tresa Endo   Subjective   83 year old female with a history of hypertension, diabetes, persistent atrial fibrillation She was admitted on September 8 following a fall with subsequent pelvic fracture She had hypertension and a subsequent fall in her hemoglobin. Eliquis has been held. She also developed an ileus. She still very sore. She starting to eat a little bit.  Inpatient Medications    Scheduled Meds: . acyclovir  800 mg Oral BID  . apixaban  5 mg Oral BID  . atorvastatin  40 mg Oral Daily  . Chlorhexidine Gluconate Cloth  6 each Topical Daily  . insulin aspart  0-9 Units Subcutaneous Q4H  . methylPREDNISolone (SOLU-MEDROL) injection  40 mg Intravenous Daily  . mirabegron ER  25 mg Oral Daily  . multivitamin with minerals  1 tablet Oral Daily  . pantoprazole  40 mg Oral Daily  . prednisoLONE acetate  1 drop Right Eye BID  . senna  1 tablet Oral BID  . sodium chloride flush  10-40 mL Intracatheter Q12H  . sulfamethoxazole-trimethoprim  1 tablet Oral Q12H   Continuous Infusions: . sodium chloride 75 mL/hr at 03/28/17 0410  . amiodarone 30 mg/hr (03/28/17 0029)  . diltiazem (CARDIZEM) infusion 15 mg/hr (03/28/17 0719)   PRN Meds: acetaminophen, albuterol, bisacodyl, ketorolac, ondansetron **OR** ondansetron (ZOFRAN) IV, sodium chloride flush   Vital Signs    Vitals:   03/27/17 2000 03/27/17 2300 03/28/17 0300 03/28/17 0726  BP: (!) 135/57 (!) 111/51 (!) 103/51 (!) 134/59  Pulse: 91 83 75 82  Resp: (!) 25 20 (!) 21 (!) 22  Temp: 98 F (36.7 C) 98 F (36.7 C) 98 F (36.7 C) 97.8 F (36.6 C)  TempSrc: Oral Oral Oral Oral  SpO2: 96% 97% 95% 94%  Weight:      Height:        Intake/Output Summary (Last 24 hours) at 03/28/17 0843 Last data filed at 03/28/17 0600  Gross per 24 hour  Intake          3730.99 ml  Output             1900 ml  Net          1830.99 ml    Filed Weights   03/20/17 0150  Weight: 170 lb (77.1 kg)    Telemetry    Atrial fib with slightly tachycardic ventricular response - Personally Reviewed  ECG    Atrial fibrillation. - Personally Reviewed  Physical Exam   GEN:  Elderly female. She is in no acute distress. Neck: No JVD Cardiac:   irregular irregular. Respiratory:    clear anteriorly. GI:  mild distended. She has a few bowel sounds. MS: No edema; No deformity. Neuro:  Nonfocal  Psych: Normal affect   Labs    Chemistry Recent Labs Lab 03/24/17 0338 03/25/17 0410 03/27/17 0602 03/27/17 1600  NA 137 137 136 135  K 3.9 3.9 3.6 3.9  CL 102 103 102 100*  CO2 GLUCOSE 152* 156* 176* 140*  BUN 21* 21* 25* 30*  CREATININE 0.78 0.91 0.70 0.80  CALCIUM 8.4* 8.2* 7.9* 8.3*  PROT 5.8* 5.7*  --   --   ALBUMIN 2.9* 2.7*  --   --   AST 26 21  --   --   ALT 22 23  --   --   ALKPHOS 53 53  --   --  BILITOT 1.4* 1.7*  --   --   GFRNONAA >60 56* >60 >60  GFRAA >60 >60 >60 >60  ANIONGAP Hematology Recent Labs Lab 03/25/17 0410 03/26/17 0426 03/27/17 0602  WBC 10.4 18.4* 14.0*  RBC 2.48* 2.45* 2.54*  HGB 7.4* 7.4* 7.7*  HCT 23.2* 23.0* 24.1*  MCV 93.5 93.9 94.9  MCH 29.8 30.2 30.3  MCHC 31.9 32.2 32.0  RDW 15.9* 15.9* 16.3*  PLT 310 410* 454*    Cardiac EnzymesNo results for input(s): TROPONINI in the last 168 hours. No results for input(s): TROPIPOC in the last 168 hours.   BNP Recent Labs Lab 03/24/17 1148  BNP 373.3*     DDimer No results for input(s): DDIMER in the last 168 hours.   Radiology    Dg Abd 1 View  Result Date: 03/27/2017 CLINICAL DATA:  Abdominal pain EXAM: ABDOMEN - 1 VIEW COMPARISON:  03/26/2017 FINDINGS: The dilute contrast medium in the colon. No visible dilated bowel. Gastric distention. Gas in indistinct loops of pelvic bowel. Fractures of the bilateral pubic rami are again observed. Patient also has a known left sacral fracture and left L5  transverse process fracture. IMPRESSION: 1. There is gas in indistinct loops of pelvic bowel which, if within small bowel, might be borderline dilated. No overtly dilated bowel observed. 2. Dilute contrast medium in the colon. 3. Pelvic fractures. Electronically Signed   By: Gaylyn Rong M.D.   On: 03/27/2017 12:12    Cardiac Studies     Patient Profile     83 y.o. female with chronic atrial fibrillation who is admitted with a mechanical fall and subsequent pelvic fracture.  The anticoagulatino  was been held because of suspicion of bleeding associated with a pelvic fracture.  Eliquis has been restarted at this pint   Assessment & Plan    1. Persistent atrial fibrillation: Her heart rate well controlled for the most mar,   Continue current medications. Her blood pressure is better.   We can add low dose metoprolol if neede.d  On eliquis.  Hb is 7.7 as of 03/27/17   2.  Chronic diastolic congestive heart failure:   Continue same medications. Her I/O are + 10 liters this admission.  She is getting IVF at 75 cc / hr. Im not sure if she needs that ( although she admits that she is not eating very well Will start metoprol and taper / DC Dilt drip      For questions or updates, please contact [facility] Please consult [email or web address] for contact info under Cardiology/STEMI.      Signed, Kristeen Miss, MD  03/28/2017, 8:43 AM

## 2017-03-28 NOTE — Progress Notes (Signed)
Orthopedic Trauma Service Progress Note   Patient ID: Tracey Porter MRN: [id number] DOB/AGE: May 31, 1930 83 y.o.  Subjective:  Pt working with therapies Transferring to chair  Discussed bone health issues with daughter  Pt has been on fosamax for many years believes it  >5 at least    pts vitamin d levels look ok     Would recommend DEXA as an outpatient once mobilizing better   ROS As above   Objective:   VITALS:   Vitals:   03/27/17 2000 03/27/17 2300 03/28/17 0300 03/28/17 0726  BP: (!) 135/57 (!) 111/51 (!) 103/51 (!) 134/59  Pulse: 91 83 75 82  Resp: (!) 25 20 (!) 21 (!) 22  Temp: 98 F (36.7 C) 98 F (36.7 C) 98 F (36.7 C) 97.8 F (36.6 C)  TempSrc: Oral Oral Oral Oral  SpO2: 96% 97% 95% 94%  Weight:      Height:        Estimated body mass index is 27.44 kg/m as calculated from the following:   Height as of this encounter:  (1.676 m).   Weight as of this encounter: 77.1 kg (170 lb).   Intake/Output      09/16 0701 - 09/17 0700 09/17 0701 - 09/18 0700   P.O. 675    I.V. (mL/kg) 2835.2 (36.8)    Blood 327.5    Total Intake(mL/kg) 3837.7 (49.8)    Urine (mL/kg/hr) 1900 (1)    Total Output 1900     Net +1937.7          Urine Occurrence 1 x    Stool Occurrence 17 x      LABS  Results for orders placed or performed during the hospital encounter of 03/19/17 (from the past 24 hour(s))  Glucose, capillary     Status: Abnormal   Collection Time: 03/27/17 11:44 AM  Result Value Ref Range   Glucose-Capillary 144 (H) 65 - 99 mg/dL  Glucose, capillary     Status: Abnormal   Collection Time: 03/27/17  3:55 PM  Result Value Ref Range   Glucose-Capillary 134 (H) 65 - 99 mg/dL  Basic metabolic panel     Status: Abnormal   Collection Time: 03/27/17  4:00 PM  Result Value Ref Range   Sodium 135 135 - 145 mmol/L   Potassium 3.9 3.5 - 5.1 mmol/L   Chloride 100 (L) 101 - 111 mmol/L   CO2 28 22 - 32 mmol/L   Glucose, Bld 140  (H) 65 - 99 mg/dL   BUN 30 (H) 6 - 20 mg/dL   Creatinine, Ser 4.09 0.44 - 1.00 mg/dL   Calcium 8.3 (L) 8.9 - 10.3 mg/dL   GFR calc non Af Amer >60 >60 mL/min   GFR calc Af Amer >60 >60 mL/min   Anion gap 7 5 - 15  Magnesium     Status: None   Collection Time: 03/27/17  4:00 PM  Result Value Ref Range   Magnesium 2.0 1.7 - 2.4 mg/dL  Glucose, capillary     Status: Abnormal   Collection Time: 03/27/17  7:55 PM  Result Value Ref Range   Glucose-Capillary 135 (H) 65 - 99 mg/dL  Glucose, capillary     Status: Abnormal   Collection Time: 03/28/17 12:12 AM  Result Value Ref Range   Glucose-Capillary 124 (H) 65 - 99 mg/dL  Glucose, capillary     Status: Abnormal   Collection Time: 03/28/17  3:24 AM  Result Value Ref Range   Glucose-Capillary  109 (H) 65 - 99 mg/dL  Glucose, capillary     Status: Abnormal   Collection Time: 03/28/17  7:25 AM  Result Value Ref Range   Glucose-Capillary 116 (H) 65 - 99 mg/dL  CBC with Differential/Platelet     Status: Abnormal   Collection Time: 03/28/17 10:00 AM  Result Value Ref Range   WBC 11.6 (H) 4.0 - 10.5 K/uL   RBC 2.78 (L) 3.87 - 5.11 MIL/uL   Hemoglobin 8.1 (L) 12.0 - 15.0 g/dL   HCT 16.1 (L) 09.6 - 04.5 %   MCV 91.4 78.0 - 100.0 fL   MCH 29.1 26.0 - 34.0 pg   MCHC 31.9 30.0 - 36.0 g/dL   RDW 40.9 (H) 81.1 - 91.4 %   Platelets 387 150 - 400 K/uL   Neutrophils Relative % 83 %   Neutro Abs 9.6 (H) 1.7 - 7.7 K/uL   Lymphocytes Relative 6 %   Lymphs Abs 0.7 0.7 - 4.0 K/uL   Monocytes Relative 11 %   Monocytes Absolute 1.3 (H) 0.1 - 1.0 K/uL   Eosinophils Relative 0 %   Eosinophils Absolute 0.0 0.0 - 0.7 K/uL   Basophils Relative 0 %   Basophils Absolute 0.0 0.0 - 0.1 K/uL  Basic metabolic panel     Status: Abnormal   Collection Time: 03/28/17 10:00 AM  Result Value Ref Range   Sodium 136 135 - 145 mmol/L   Potassium 3.0 (L) 3.5 - 5.1 mmol/L   Chloride 104 101 - 111 mmol/L   CO2 26 22 - 32 mmol/L   Glucose, Bld 177 (H) 65 - 99 mg/dL    BUN 28 (H) 6 - 20 mg/dL   Creatinine, Ser 7.82 0.44 - 1.00 mg/dL   Calcium 7.5 (L) 8.9 - 10.3 mg/dL   GFR calc non Af Amer >60 >60 mL/min   GFR calc Af Amer >60 >60 mL/min   Anion gap 6 5 - 15     PHYSICAL EXAM:   Gen: working with PT/OT, transferring to chair with use of walker. Less confused than she was on Friday  Pelvis/LEx  No change in exam   Mild tenderness L hemipelvis  No thigh pain noted  Motor and sensory functions intact   Ext warm   Assessment/Plan:     Principal Problem:   Multiple fractures of pelvis with unstable disruption of pelvic ring, initial encounter for closed fracture (HCC) Active Problems:   DM2 (diabetes mellitus, type 2) (HCC)   Paroxysmal SVT (supraventricular tachycardia) (HCC)   Essential hypertension   Closed pelvic fracture (HCC)   Leukocytosis   Fall at home, initial encounter   Atrial fibrillation with RVR (HCC)   Aortic atherosclerosis (HCC)   Chronic atrial fibrillation (HCC)   Hypotension due to blood loss   Coagulopathy (HCC)   Chronic diastolic heart failure (HCC)   Anti-infectives    Start     Dose/Rate Route Frequency Ordered Stop   03/28/17 1000  sulfamethoxazole-trimethoprim (BACTRIM DS,SEPTRA DS) 800-160 MG per tablet 1 tablet     1 tablet Oral Every 12 hours 03/28/17 0912 04/02/17 0959   03/27/17 2200  sulfamethoxazole-trimethoprim (BACTRIM,SEPTRA) 400-80 MG per tablet 1 tablet  Status:  Discontinued     1 tablet Oral Every 12 hours 03/27/17 1620 03/28/17 0912   03/25/17 1745  cefTRIAXone (ROCEPHIN) 1 g in dextrose 5 % 50 mL IVPB  Status:  Discontinued     1 g 100 mL/hr over 30 Minutes Intravenous Every 24 hours 03/25/17  1733 03/25/17 1734   03/20/17 0230  acyclovir (ZOVIRAX) tablet 800 mg     800 mg Oral 2 times daily 03/20/17 0220      .  POD/HD#:   83 y/o female s/p ground level fall with LC type pelvic ring fracture    -left LC type pelvic ring fracture, L sacral fracture                         TDWB L  leg              WBAT R leg             Mobilize with PT             No ROM restrictions  Continue non-op tx     - Pain management:             Minimize narcotics  Tylenol prn    - ABL anemia/Hemodynamics             cbc improved this am    - Medical issues              Per primary service                           Ileus                         Per primary                         Potassium low                            - DVT/PE prophylaxis:            eliquis    - Metabolic Bone Disease:             vitamin D looks good  Check PTH, TSH looks good     Pt on fosamax PTA, would dc this medication at dc due to MOA and how it will impair correct bone formation/remodeling pathway      Would absolutely dc fosamax at dc and consider alternative class of medication.             consider forteo or prolia but would lean more towards forteo as it is the only anabolic agent on market at this time   Will need DEXA as outpt                 Would recommend daily vitamin d3 supplementation at discharge (2000-5000 IUs daily)  - Activity:             TDWB L leg   - FEN/GI prophylaxis/Foley/Lines:           full liquids    Recommend nutrition consult when tolerating solids to ensure intake is adequate    - Impediments to fracture healing:             Osteoporosis, fosamax, DM   - Dispo:             continue therapies  Ortho issues stable      Mearl Latin, PA-C [facility] [phone]) 03/28/2017, 11:21 AM

## 2017-03-28 NOTE — Care Management Important Message (Signed)
Important Message  Patient Details  Name: Tracey Porter MRN: [id number] Date of Birth: August 20, 1929   Medicare Important Message Given:  Yes    [name] 03/28/2017, 10:38 AM

## 2017-03-28 NOTE — Progress Notes (Signed)
Physical Therapy Treatment Patient Details Name: Tracey Porter MRN: [id number] DOB: 1929-12-30 Today's Date: 03/28/2017    History of Present Illness Pt is an 83 yo female admitted after a fall with pelvic fx w associated hemorrhage.  Pt with mild dementia, h/o afib, currently in afib with RVR. Pt was in traction but now out of traction and attempting conservative management with TDWB on LLE and WBAT RLE.    PT Comments    Daughter present during session today. She is very eager for her mom to get up and moving. Pt reports she is feeling better. Able to perform stand pivot transfer with RW bed to recliner. Daughter requesting education on exercises she can assist pt with. Pt provided with theraband in room. Unable to progress with exercises this session due to pt requiring return to bed for rectal pouch leaking.    Follow Up Recommendations  SNF;Supervision for mobility/OOB     Equipment Recommendations  Rolling walker with 5" wheels    Recommendations for Other Services       Precautions / Restrictions Precautions Precautions: Fall Restrictions RLE Weight Bearing: Weight bearing as tolerated LLE Weight Bearing: Touchdown weight bearing    Mobility  Bed Mobility         Supine to sit: Max assist;HOB elevated Sit to supine: +2 for physical assistance;Total assist   General bed mobility comments: verbal cues for sequencing, increased time to stabilize balance EOB  Transfers   Equipment used: Rolling walker (2 wheeled)   Sit to Stand: Mod assist;Max assist;+2 physical assistance Stand pivot transfers: Max assist;+2 physical assistance       General transfer comment: verbal cues for hand placement and sequencing. Pivot transfer toward right. Decreased motor planning/initiation requiring tactile cues/assist to initialize pivot with hips.   Ambulation/Gait             General Gait Details: Unable   Stairs            Wheelchair Mobility    Modified  Rankin (Stroke Patients Only)       Balance   Sitting-balance support: Feet supported;Bilateral upper extremity supported Sitting balance-Leahy Scale: Fair Sitting balance - Comments: min guard assist EOB sitting    Standing balance support: Bilateral upper extremity supported;During functional activity Standing balance-Leahy Scale: Poor                              Cognition Arousal/Alertness: Awake/alert Behavior During Therapy: WFL for tasks assessed/performed Overall Cognitive Status: History of cognitive impairments - at baseline                                        Exercises      General Comments        Pertinent Vitals/Pain Pain Assessment: Faces Faces Pain Scale: Hurts little more Pain Location: LLE with mobility Pain Descriptors / Indicators: Discomfort;Grimacing;Guarding Pain Intervention(s): Monitored during session;Limited activity within patient's tolerance;Repositioned    Home Living                      Prior Function            PT Goals (current goals can now be found in the care plan section) Acute Rehab PT Goals Patient Stated Goal: not stated PT Goal Formulation: With patient Time For Goal Achievement: 04/04/17 Potential to Achieve  Goals: Fair Progress towards PT goals: Progressing toward goals    Frequency    Min 3X/week      PT Plan Current plan remains appropriate    Co-evaluation              AM-PAC PT "6 Clicks" Daily Activity  Outcome Measure  Difficulty turning over in bed (including adjusting bedclothes, sheets and blankets)?: Unable Difficulty moving from lying on back to sitting on the side of the bed? : Unable Difficulty sitting down on and standing up from a chair with arms (e.g., wheelchair, bedside commode, etc,.)?: Unable Help needed moving to and from a bed to chair (including a wheelchair)?: A Lot Help needed walking in hospital room?: Total Help needed climbing 3-5  steps with a railing? : Total 6 Click Score: 7    End of Session Equipment Utilized During Treatment: Gait belt Activity Tolerance: Patient tolerated treatment well Patient left: in chair;with chair alarm set;with call bell/phone within reach;with nursing/sitter in room Nurse Communication: Mobility status PT Visit Diagnosis: Unsteadiness on feet (R26.81);Dizziness and giddiness (R42);Muscle weakness (generalized) (M62.81);Pain Pain - Right/Left: Left Pain - part of body: Hip;Leg     Time: 1610-9604 PT Time Calculation (min) (ACUTE ONLY): 25 min  Charges:  $Therapeutic Activity: 23-37 mins                    G Codes:       Aida Raider, PT  Office # [phone] Pager [phone]    Ilda Foil 03/28/2017, 1:09 PM

## 2017-03-28 NOTE — Progress Notes (Addendum)
PROGRESS NOTE  Nil Bolser  RUE:[id number] DOB: 12-01-1929 DOA: 03/19/2017 PCP: [place], Julie M, NP   Brief Narrative: Tracey Porter is a 83 y.o. female who presented after fall found to have multiple closed pelvic fractures and AFib w/RVR. Orthopedic surgery recommended nonoperative management. Cardiology was consulted for difficult rate control, which is improving. She has been hypoxic undergoing treatment for COPD exacerbation, V/Q scan without acute findings. Her mentation has improved with treatment, CT head was negative. She developed ileus which has resolved with loose stools starting 9/17.   Assessment & Plan: Principal Problem:   Multiple fractures of pelvis with unstable disruption of pelvic ring, initial encounter for closed fracture (HCC) Active Problems:   DM2 (diabetes mellitus, type 2) (HCC)   Paroxysmal SVT (supraventricular tachycardia) (HCC)   Essential hypertension   Closed pelvic fracture (HCC)   Leukocytosis   Fall at home, initial encounter   Atrial fibrillation with RVR (HCC)   Aortic atherosclerosis (HCC)   Chronic atrial fibrillation (HCC)   Hypotension due to blood loss   Coagulopathy (HCC)   Chronic diastolic heart failure (HCC)  Multiple closed pelvic fractures: Nonoperative management recommended by orthopedics.  - Avoid narcotics as able due to recent ileus  Chronic atrial fibrillation with RVR:  - Appreciate cardiology assistance. BP and ventricular rate improving. Metoprolol  BID, hold amiodarone and diltiazem gtt's.  - Continue eliquis  Chronic diastolic CHF: - DC IVF's, continue holding lasix. +5L this admission.  - Strict I/O, daily weights ordered  Acute hypoxic respiratory failure: Thought to be due to COPD exacerbation (h/o smoking), though there is no wheezing and no obvious history of COPD, lungs on CXR not hyperinflated..  - Supplemental oxygen as needed to maintain SpO2 >88% - Weaning steroids, down to solumedrol  daily as  of 9/16, will transition to prednisone 9/18. - Bronchodilators, change albuterol to xopenex prn.  Acute blood loss anemia: Thought to be related to pelvic fractures, baseline hgb is uncertain. - Required transfusions, latest 1u PRBCs 9/16, w/hgb up to 8.1, no active bleeding noted. - Monitor CBC daily, transfusion threshold /dl.  - If hgb trends down again would stop eliquis, consider reimaging pelvis.   E. coli UTI:  - Bactrim x5 days, monitor Cr, K. PCN allergy noted.  T2DM: Well-controlled, HbA1c 6.2%, in fact may be too tight of control given age and comorbidities.  - Holding metformin - Sensitive SSI  Hypokalemia:  - Replete to maintain K >4, Mg >2.  Ileus: CT abd/pelvis without obstruction consistent with ileus on 9/11. KUB 9/16 w/contrast in colon, no dilated bowel. Has begun having loose stools as of 9/17 while being treated with stimulant laxatives.  - Resolved, hold laxatives until loose stool resolves.  - Continue OOB, restart bowel regimen once able to prevent recurrence  s/p corneal transplant:  - Continue acyclovir  Overactive bladder: Chronic, stable - Continue myrbetriq  DVT prophylaxis: Eliquis Code Status: Full Family Communication: None at bedside this AM Disposition Plan: Anticipate transfer to telemetry if off gtt's 9/17 and DC to SNF 9/18  Consultants:   Orthopedics  Cardiology  Procedures:   None  Antimicrobials:  Acyclovir  Bactrim  Subjective: Pain controlled, has not noticed bleeding. Feels tired, but able to eat.   Objective: Vitals:   03/28/17 0726 03/28/17 1140 03/28/17 1200 03/28/17 1212  BP: (!) 134/59 (!) 119/56 100/60   Pulse: 82 90 72 70  Resp: (!) 22 (!) 30 (!) 25 (!) 21  Temp: 97.8 F (36.6 C) 97.9 F (  36.6 C)    TempSrc: Oral Oral    SpO2: 94% 96% 97% 96%  Weight:      Height:        Intake/Output Summary (Last 24 hours) at 03/28/17 1547 Last data filed at 03/28/17 1143  Gross per 24 hour  Intake           2033.77 ml  Output             2300 ml  Net          -266.23 ml   Filed Weights   03/20/17 0150  Weight: 77.1 kg (170 lb)    Gen: Pleasant elderly female in no distress Pulm: Non-labored breathing with supplemental O2. Clear to auscultation bilaterally.  CV: Irreg irreg, rate in 80's. No murmur, rub, or gallop. No JVD, no pedal edema. GI: Abdomen soft, non-tender, non-distended, with normoactive bowel sounds. No organomegaly or masses felt. Ext: Warm, no deformities Skin: No rashes, lesions no ulcers Neuro: Alert and oriented. No focal neurological deficits. Psych: Judgement and insight appear normal. Mood & affect appropriate.   Data Reviewed: I have personally reviewed following labs and imaging studies  CBC:  Recent Labs Lab 03/24/17 0338 03/25/17 0410 03/26/17 0426 03/27/17 0602 03/28/17 1000  WBC 17.5* 10.4 18.4* 14.0* 11.6*  NEUTROABS 14.2* 9.4* 16.5* 11.9* 9.6*  HGB 8.1* 7.4* 7.4* 7.7* 8.1*  HCT 25.1* 23.2* 23.0* 24.1* 25.4*  MCV 93.0 93.5 93.9 94.9 91.4  PLT 312 310 410* 454* 387   Basic Metabolic Panel:  Recent Labs Lab 03/24/17 0338 03/25/17 0410 03/27/17 0602 03/27/17 1600 03/28/17 1000  NA 137 137 136 135 136  K 3.9 3.9 3.6 3.9 3.0*  CL 102 103 102 100* 104  CO2 GLUCOSE 152* 156* 176* 140* 177*  BUN 21* 21* 25* 30* 28*  CREATININE 0.78 0.91 0.70 0.80 0.69  CALCIUM 8.4* 8.2* 7.9* 8.3* 7.5*  MG  --   --   --  2.0  --    GFR: Estimated Creatinine Clearance: 53.9 mL/min (by C-G formula based on SCr of 0.69 mg/dL). Liver Function Tests:  Recent Labs Lab 03/24/17 0338 03/25/17 0410  AST 26 21  ALT 22 23  ALKPHOS 53 53  BILITOT 1.4* 1.7*  PROT 5.8* 5.7*  ALBUMIN 2.9* 2.7*   No results for input(s): LIPASE, AMYLASE in the last 168 hours. No results for input(s): AMMONIA in the last 168 hours. Coagulation Profile: No results for input(s): INR, PROTIME in the last 168 hours. Cardiac Enzymes: No results for input(s): CKTOTAL,  CKMB, CKMBINDEX, TROPONINI in the last 168 hours. BNP (last 3 results) No results for input(s): PROBNP in the last 8760 hours. HbA1C: No results for input(s): HGBA1C in the last 72 hours. CBG:  Recent Labs Lab 03/27/17 1955 03/28/17 0012 03/28/17 0324 03/28/17 0725 03/28/17 1139  GLUCAP 135* 124* 109* 116* 120*   Lipid Profile: No results for input(s): CHOL, HDL, LDLCALC, TRIG, CHOLHDL, LDLDIRECT in the last 72 hours. Thyroid Function Tests: No results for input(s): TSH, T4TOTAL, FREET4, T3FREE, THYROIDAB in the last 72 hours. Anemia Panel: No results for input(s): VITAMINB12, FOLATE, FERRITIN, TIBC, IRON, RETICCTPCT in the last 72 hours. Urine analysis:    Component Value Date/Time   COLORURINE YELLOW 03/19/2017 2337   APPEARANCEUR HAZY (A) 03/19/2017 2337   LABSPEC 1.018 03/19/2017 2337   PHURINE 5.0 03/19/2017 2337   GLUCOSEU NEGATIVE 03/19/2017 2337   HGBUR NEGATIVE 03/19/2017 2337   BILIRUBINUR NEGATIVE  03/19/2017 2337   KETONESUR 5 (A) 03/19/2017 2337   PROTEINUR NEGATIVE 03/19/2017 2337   UROBILINOGEN 0.2 03/19/2015 1945   NITRITE POSITIVE (A) 03/19/2017 2337   LEUKOCYTESUR MODERATE (A) 03/19/2017 2337   Recent Results (from the past 240 hour(s))  Surgical pcr screen     Status: None   Collection Time: 03/20/17  1:50 AM  Result Value Ref Range Status   MRSA, PCR NEGATIVE NEGATIVE Final   Staphylococcus aureus NEGATIVE NEGATIVE Final    Comment: (NOTE) The Xpert SA Assay (FDA approved for NASAL specimens in patients 30 years of age and older), is one component of a comprehensive surveillance program. It is not intended to diagnose infection nor to guide or monitor treatment.   Culture, Urine     Status: Abnormal   Collection Time: 03/24/17 11:29 AM  Result Value Ref Range Status   Specimen Description URINE, CATHETERIZED  Final   Special Requests NONE  Final   Culture >=100,000 COLONIES/mL ESCHERICHIA COLI (A)  Final   Report Status 03/26/2017 FINAL   Final   Organism ID, Bacteria ESCHERICHIA COLI (A)  Final      Susceptibility   Escherichia coli - MIC*    AMPICILLIN 4 SENSITIVE Sensitive     CEFAZOLIN <=4 SENSITIVE Sensitive     CEFTRIAXONE <=1 SENSITIVE Sensitive     CIPROFLOXACIN <=0.25 SENSITIVE Sensitive     GENTAMICIN <=1 SENSITIVE Sensitive     IMIPENEM <=0.25 SENSITIVE Sensitive     NITROFURANTOIN <=16 SENSITIVE Sensitive     TRIMETH/SULFA <=20 SENSITIVE Sensitive     AMPICILLIN/SULBACTAM <=2 SENSITIVE Sensitive     PIP/TAZO <=4 SENSITIVE Sensitive     Extended ESBL NEGATIVE Sensitive     * >=100,000 COLONIES/mL ESCHERICHIA COLI      Radiology Studies: Dg Abd 1 View  Result Date: 03/27/2017 CLINICAL DATA:  Abdominal pain EXAM: ABDOMEN - 1 VIEW COMPARISON:  03/26/2017 FINDINGS: The dilute contrast medium in the colon. No visible dilated bowel. Gastric distention. Gas in indistinct loops of pelvic bowel. Fractures of the bilateral pubic rami are again observed. Patient also has a known left sacral fracture and left L5 transverse process fracture. IMPRESSION: 1. There is gas in indistinct loops of pelvic bowel which, if within small bowel, might be borderline dilated. No overtly dilated bowel observed. 2. Dilute contrast medium in the colon. 3. Pelvic fractures. Electronically Signed   By: Gaylyn Rong M.D.   On: 03/27/2017 12:12    Scheduled Meds: . acyclovir  800 mg Oral BID  . apixaban  5 mg Oral BID  . atorvastatin  40 mg Oral Daily  . Chlorhexidine Gluconate Cloth  6 each Topical Daily  . insulin aspart  0-9 Units Subcutaneous Q4H  . methylPREDNISolone (SOLU-MEDROL) injection  40 mg Intravenous Daily  . metoprolol tartrate  25 mg Oral BID  . mirabegron ER  25 mg Oral Daily  . multivitamin with minerals  1 tablet Oral Daily  . pantoprazole  40 mg Oral Daily  . prednisoLONE acetate  1 drop Right Eye BID  . sodium chloride flush  10-40 mL Intracatheter Q12H  . sulfamethoxazole-trimethoprim  1 tablet Oral Q12H    Continuous Infusions: . 0.9 % NaCl with KCl 40 mEq / L    . amiodarone 30 mg/hr (03/28/17 1355)  . diltiazem (CARDIZEM) infusion 5 mg/hr (03/28/17 1312)     LOS: 8 days   Time spent: 35 minutes.  Hazeline Junker, MD [facility] Pager [phone]  If 7PM-7AM, please  contact night-coverage [email or web address] Password [facility] 03/28/2017, 3:47 PM

## 2017-03-29 ENCOUNTER — Inpatient Hospital Stay (HOSPITAL_COMMUNITY): Payer: Medicare Other

## 2017-03-29 DIAGNOSIS — D72825 Bandemia: Secondary | ICD-10-CM

## 2017-03-29 DIAGNOSIS — R4781 Slurred speech: Secondary | ICD-10-CM

## 2017-03-29 LAB — CBC
HEMATOCRIT: 28.8 % — AB (ref 36.0–46.0)
HEMOGLOBIN: 9 g/dL — AB (ref 12.0–15.0)
MCH: 29.2 pg (ref 26.0–34.0)
MCHC: 31.3 g/dL (ref 30.0–36.0)
MCV: 93.5 fL (ref 78.0–100.0)
Platelets: 482 10*3/uL — ABNORMAL HIGH (ref 150–400)
RBC: 3.08 MIL/uL — ABNORMAL LOW (ref 3.87–5.11)
RDW: 18.3 % — AB (ref 11.5–15.5)
WBC: 16.1 10*3/uL — ABNORMAL HIGH (ref 4.0–10.5)

## 2017-03-29 LAB — GLUCOSE, CAPILLARY
GLUCOSE-CAPILLARY: 113 mg/dL — AB (ref 65–99)
GLUCOSE-CAPILLARY: 113 mg/dL — AB (ref 65–99)
GLUCOSE-CAPILLARY: 123 mg/dL — AB (ref 65–99)
GLUCOSE-CAPILLARY: 138 mg/dL — AB (ref 65–99)
Glucose-Capillary: 116 mg/dL — ABNORMAL HIGH (ref 65–99)
Glucose-Capillary: 131 mg/dL — ABNORMAL HIGH (ref 65–99)
Glucose-Capillary: 134 mg/dL — ABNORMAL HIGH (ref 65–99)

## 2017-03-29 LAB — MAGNESIUM: Magnesium: 2.1 mg/dL (ref 1.7–2.4)

## 2017-03-29 LAB — BASIC METABOLIC PANEL
ANION GAP: 3 — AB (ref 5–15)
BUN: 26 mg/dL — AB (ref 6–20)
CHLORIDE: 105 mmol/L (ref 101–111)
CO2: 28 mmol/L (ref 22–32)
Calcium: 7.8 mg/dL — ABNORMAL LOW (ref 8.9–10.3)
Creatinine, Ser: 0.67 mg/dL (ref 0.44–1.00)
GFR calc Af Amer: >60 mL/min (ref >60)
Glucose, Bld: 109 mg/dL — ABNORMAL HIGH (ref 65–99)
POTASSIUM: 4.1 mmol/L (ref 3.5–5.1)
SODIUM: 136 mmol/L (ref 135–145)

## 2017-03-29 IMAGING — CT CT HEAD W/O CM
4 series · 17 of 47 positions shown, 19 images · non-contrast
Comparison: Head CT scan 03/24/2017.

CLINICAL DATA: Slurred speech today.

EXAM:
CT HEAD WITHOUT CONTRAST
TECHNIQUE: Contiguous axial images were obtained from the base of the skull
through the vertex without intravenous contrast.

[Series 3: head without · axial · non-contrast · 0.45mm/px · z∈[-182,-52]mm · 7 of 36 slices shown, 9 images]
[im 5/36  brain]
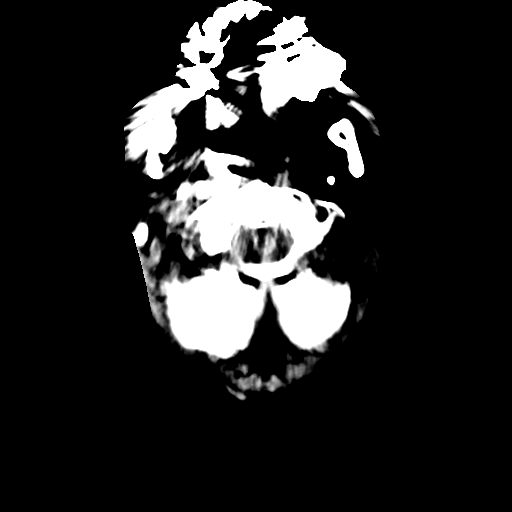
[im 5/36  bone]
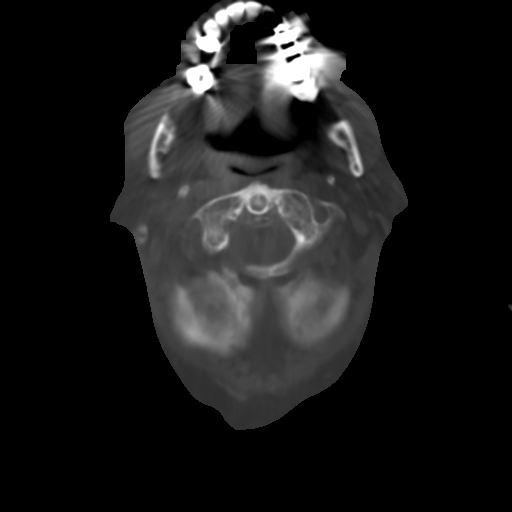
[im 9/36  brain]
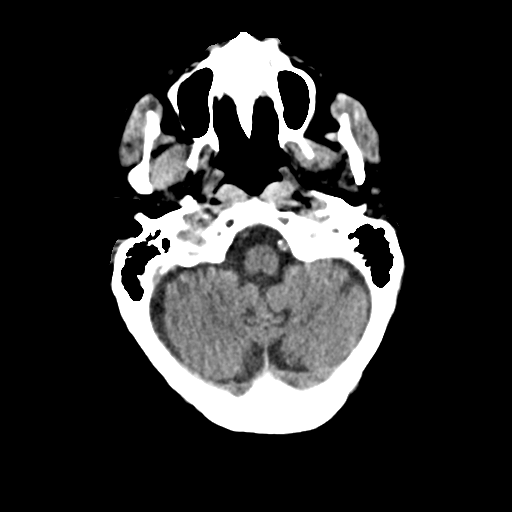
[im 14/36  brain]
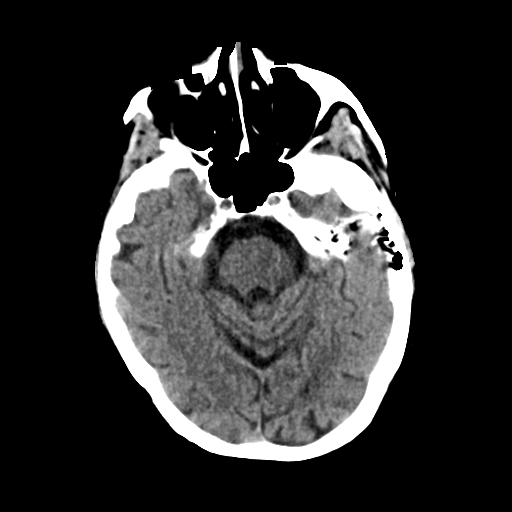
[im 18/36  brain]
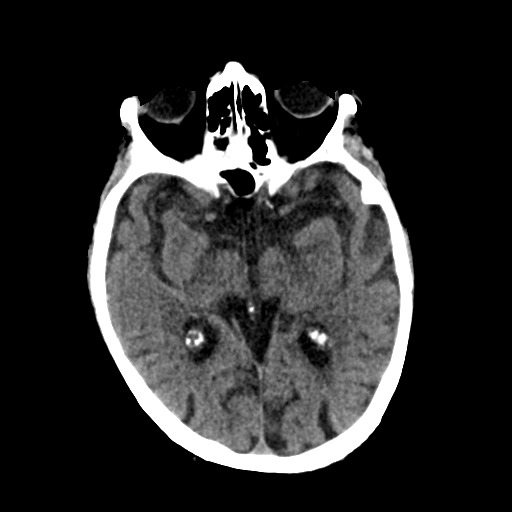
[im 22/36  brain]
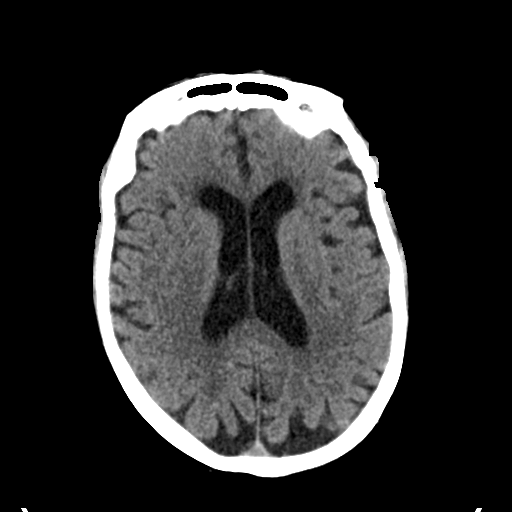
[im 22/36  bone]
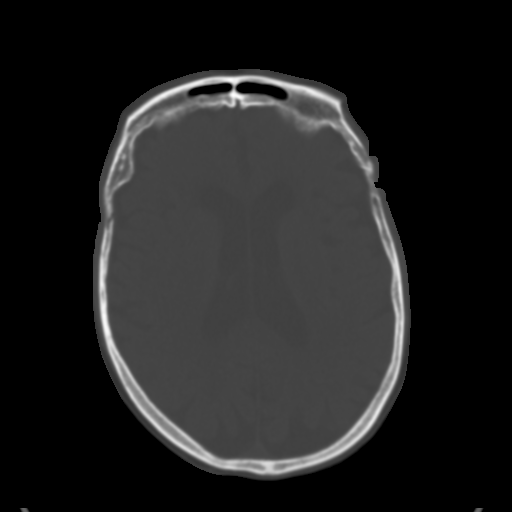
[im 27/36  brain]
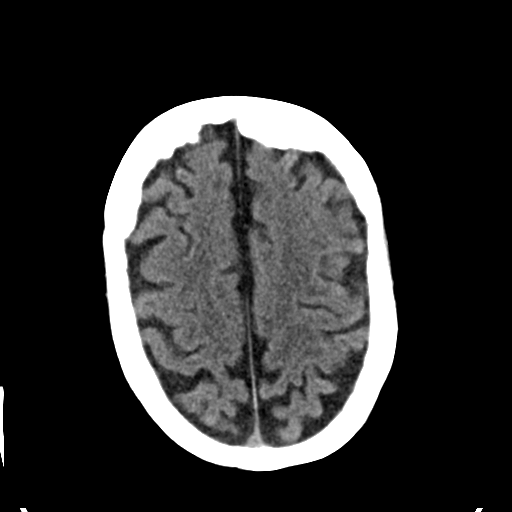
[im 31/36  brain]
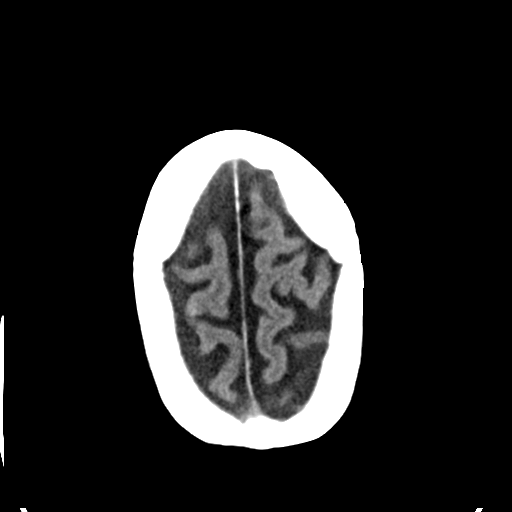

[Series 4: head bone · axial · 0.45mm/px · z∈[-186,-122]mm · 4 of 90 slices shown]
[im 9/90  bone]
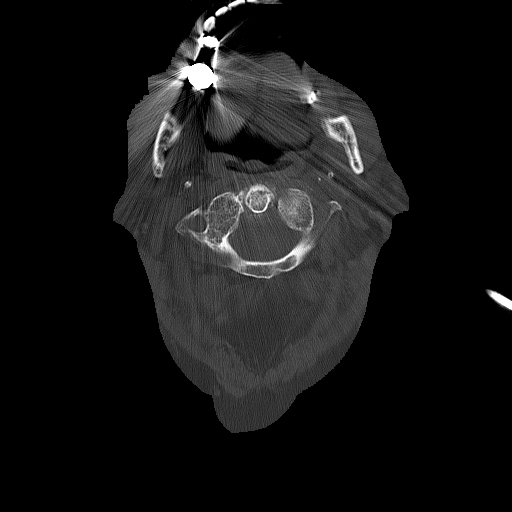
[im 18/90  bone]
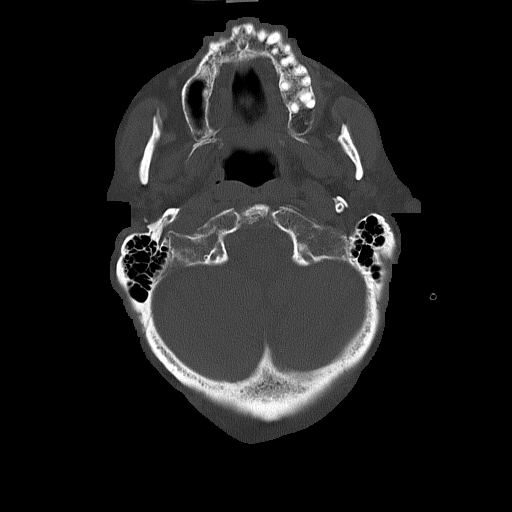
[im 27/90  bone]
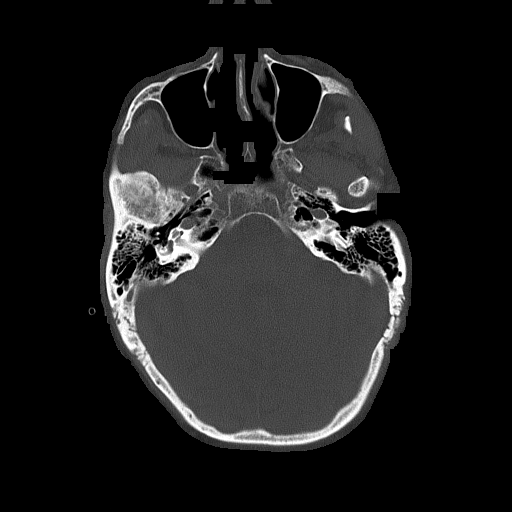
[im 41/90  bone]
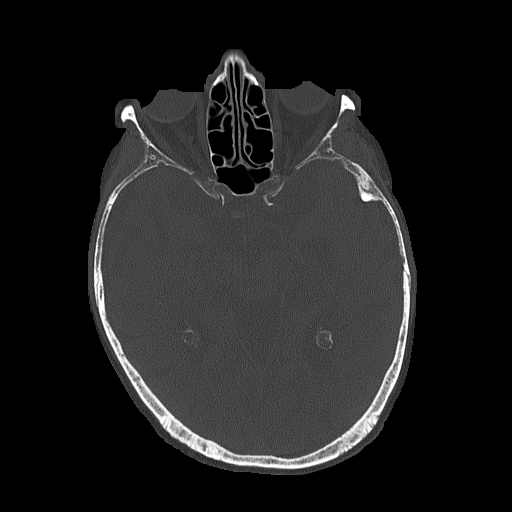

[Series 5: head without cor · coronal · non-contrast · 0.36mm/px · 3 of 74 slices shown]
[im 25/74  brain]
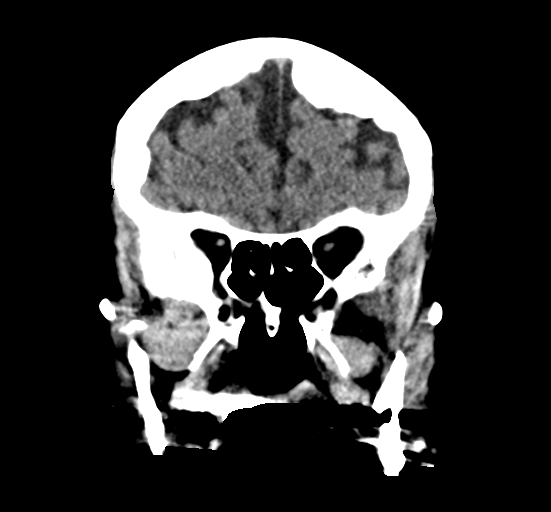
[im 33/74  brain]
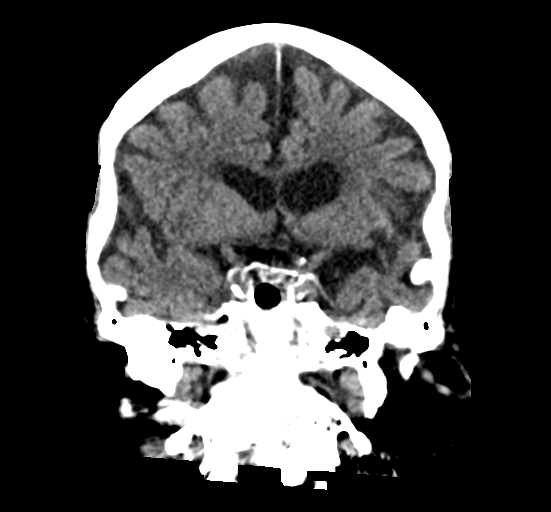
[im 41/74  brain]
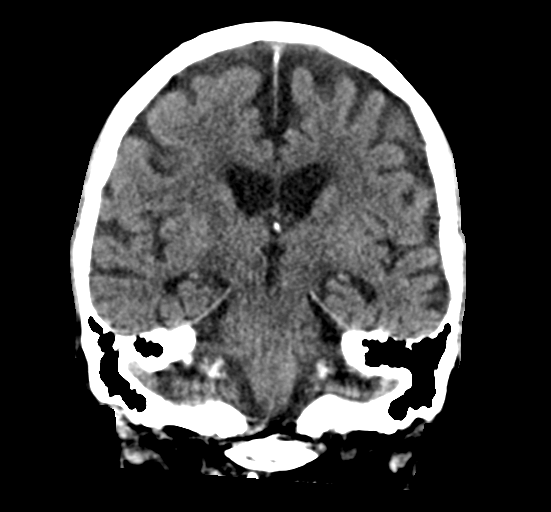

[Series 6: head without sag · sagittal · non-contrast · 0.38mm/px · 3 of 61 slices shown]
[im 21/61  brain]
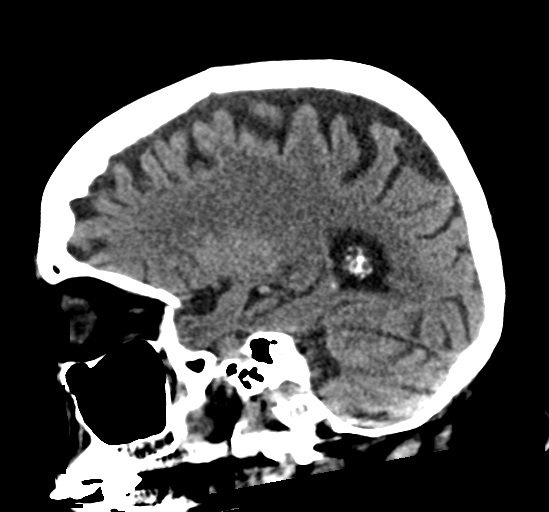
[im 31/61  brain]
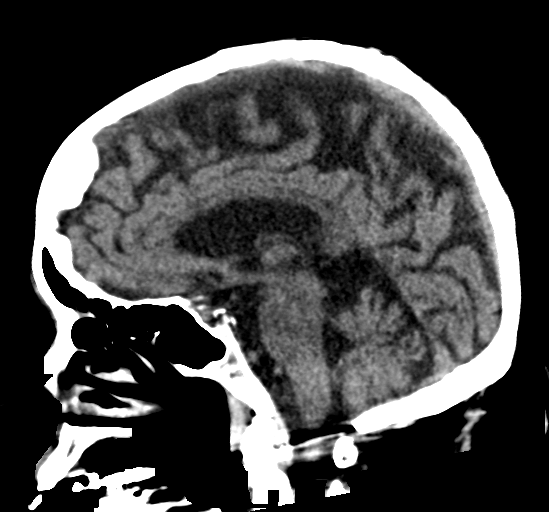
[im 41/61  brain]
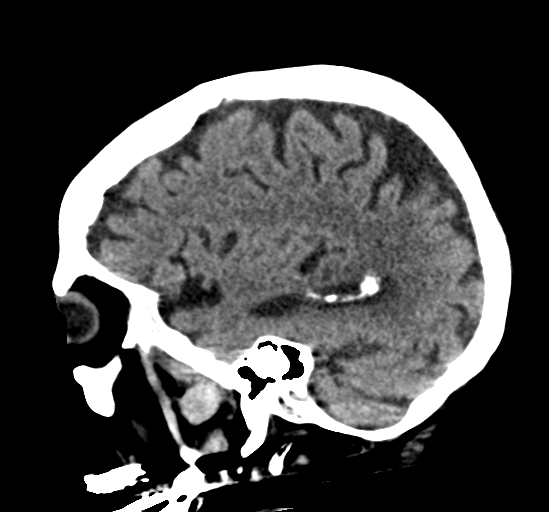

[17 of 47 positions shown; findings below may reference images not displayed]

FINDINGS: Brain: Cortical atrophy and chronic microvascular ischemic change
are identified. No evidence of acute intracranial abnormality
including hemorrhage, infarct, mass lesion, mass effect, midline
shift or abnormal extra-axial fluid collection. No hydrocephalus or
pneumocephalus.

Vascular: Atherosclerosis noted.

Skull: Intact.

Sinuses/Orbits: Negative.

Other: None.
IMPRESSION: No acute abnormality.

Atrophy and chronic microvascular ischemic change.

## 2017-03-29 MED ORDER — METOPROLOL TARTRATE 25 MG PO TABS
37.5000 mg | ORAL_TABLET | Freq: Two times a day (BID) | ORAL | Status: DC
  Administered 2017-03-29 – 2017-03-31 (×4): 37.5 mg via ORAL
  Filled 2017-03-29 (×4): qty 1

## 2017-03-29 MED ORDER — INSULIN ASPART 100 UNIT/ML ~~LOC~~ SOLN
0.0000 [IU] | Freq: Three times a day (TID) | SUBCUTANEOUS | Status: DC
  Administered 2017-03-29: 1 [IU] via SUBCUTANEOUS
  Administered 2017-03-30: 2 [IU] via SUBCUTANEOUS
  Administered 2017-03-30: 3 [IU] via SUBCUTANEOUS

## 2017-03-29 NOTE — Progress Notes (Signed)
CSW continuing to follow for SNF placement- per MD likely ready for DC tomorrow  Family wanting [facility] vs [facility]- [facility] can accept tomorrow if stable.  CSW will continue to follow  [name], LCSW Clinical Social Worker [phone]

## 2017-03-29 NOTE — Telephone Encounter (Signed)
Thank you for letting me know

## 2017-03-29 NOTE — Progress Notes (Signed)
Progress Note  Patient Name: Tracey Porter Date of Encounter: 03/29/2017  Primary Cardiologist: Tresa Endo   Subjective   83 yo with hx of HTN, DM, persistent AF .  Admitted after having a pelvic fracture  Speech is slurred today compared to yesterday  Inpatient Medications    Scheduled Meds: . acyclovir  800 mg Oral BID  . apixaban  5 mg Oral BID  . atorvastatin  40 mg Oral Daily  . insulin aspart  0-9 Units Subcutaneous TID WC  . metoprolol tartrate  25 mg Oral BID  . mirabegron ER  25 mg Oral Daily  . multivitamin with minerals  1 tablet Oral Daily  . pantoprazole  40 mg Oral Daily  . prednisoLONE acetate  1 drop Right Eye BID  . predniSONE  40 mg Oral Q breakfast  . sodium chloride flush  10-40 mL Intracatheter Q12H  . sulfamethoxazole-trimethoprim  1 tablet Oral Q12H   Continuous Infusions: . amiodarone 30 mg/hr (03/29/17 0053)  . diltiazem (CARDIZEM) infusion Stopped (03/28/17 1430)   PRN Meds: acetaminophen, bisacodyl, ketorolac, levalbuterol, ondansetron **OR** ondansetron (ZOFRAN) IV, sodium chloride flush   Vital Signs    Vitals:   03/29/17 0600 03/29/17 0732 03/29/17 0925 03/29/17 1222  BP: 97/76 110/63 126/68 116/81  Pulse: (!) 102 (!) 102    Resp: Temp: (!) 96.7 F (35.9 C) (!) 97.2 F (36.2 C)  (!) 97.2 F (36.2 C)  TempSrc: Axillary Oral  Oral  SpO2: 96% 99%    Weight:      Height:        Intake/Output Summary (Last 24 hours) at 03/29/17 1319 Last data filed at 03/29/17 0518  Gross per 24 hour  Intake            294.7 ml  Output              525 ml  Net           -230.3 ml   Filed Weights   03/20/17 0150 03/28/17 1800  Weight: 170 lb (77.1 kg) 200 lb (90.7 kg)    Telemetry    Afib  - Personally Reviewed  ECG     A -fib  - Personally Reviewed  Physical Exam   GEN: No acute distress.  Speech is garbled today  Neck: No JVD Cardiac: irreg. Irreg. ,  Respiratory: Clear to auscultation bilaterally. GI: Soft,  nontender, non-distended  MS: No edema; No deformity. Neuro:  Nonfocal  Psych: Normal affect   Labs    Chemistry Recent Labs Lab 03/24/17 0338 03/25/17 0410  03/27/17 1600 03/28/17 1000 03/29/17 0435  NA 137 137  < > 135 136 136  K 3.9 3.9  < > 3.9 3.0* 4.1  CL 102 103  < > 100* 104 105  CO2 27 28  < > GLUCOSE 152* 156*  < > 140* 177* 109*  BUN 21* 21*  < > 30* 28* 26*  CREATININE 0.78 0.91  < > 0.80 0.69 0.67  CALCIUM 8.4* 8.2*  < > 8.3* 7.5* 7.8*  PROT 5.8* 5.7*  --   --   --   --   ALBUMIN 2.9* 2.7*  --   --   --   --   AST 26 21  --   --   --   --   ALT 22 23  --   --   --   --   ALKPHOS 53  53  --   --   --   --   BILITOT 1.4* 1.7*  --   --   --   --   GFRNONAA >60 56*  < > >60 >60 >60  GFRAA >60 >60  < > >60 >60 >60  ANIONGAP 8 6  < > 7 6 3*  < > = values in this interval not displayed.   Hematology Recent Labs Lab 03/26/17 0426 03/27/17 0602 03/28/17 1000  WBC 18.4* 14.0* 11.6*  RBC 2.45* 2.54* 2.78*  HGB 7.4* 7.7* 8.1*  HCT 23.0* 24.1* 25.4*  MCV 93.9 94.9 91.4  MCH 30.2 30.3 29.1  MCHC 32.2 32.0 31.9  RDW 15.9* 16.3* 18.2*  PLT 410* 454* 387    Cardiac EnzymesNo results for input(s): TROPONINI in the last 168 hours. No results for input(s): TROPIPOC in the last 168 hours.   BNP Recent Labs Lab 03/24/17 1148  BNP 373.3*     DDimer No results for input(s): DDIMER in the last 168 hours.   Radiology    No results found.  Cardiac Studies      Patient Profile     83 y.o. female with A-fib, HTN, DM    Assessment & Plan    1.  Atrial fib - HR is still a bit elevated.  She is on amio drip and metoprolol 25 po BID .  BP is low so it may be difficult to increase the metoprolol much further .   We can try 37.5 mg BID .  2.  Slurred speech:   Will let the primary team know. She may need further work up   For questions or updates, please contact [facility] Please consult [email or web address] for contact info under  Cardiology/STEMI.      Signed, Kristeen Miss, MD  03/29/2017, 1:19 PM

## 2017-03-29 NOTE — Plan of Care (Signed)
Problem: Skin Integrity: Goal: Risk for impaired skin integrity will decrease Outcome: Not Progressing Patient continues to have liquid stools. Rectal tube in place, but stool leaks around seal. Minor skin irritation noted to buttocks and inner thighs. Barrier cream applied.

## 2017-03-29 NOTE — Progress Notes (Signed)
PROGRESS NOTE  Tracey Porter  ZOX:[id number] DOB: 11-21-1929 DOA: 03/19/2017 PCP: [place], Julie M, NP   Brief Narrative: Tracey Porter is a 83 y.o. female who presented after fall found to have multiple closed pelvic fractures and AFib w/RVR. Orthopedic surgery recommended nonoperative management. Cardiology was consulted for difficult rate control, which is improving. She has been hypoxic undergoing treatment for COPD exacerbation, V/Q scan without acute findings. Her mentation has improved with treatment, CT head was negative. She developed ileus which has resolved with loose stools starting 9/17.   Assessment & Plan: Principal Problem:   Multiple fractures of pelvis with unstable disruption of pelvic ring, initial encounter for closed fracture (HCC) Active Problems:   DM2 (diabetes mellitus, type 2) (HCC)   Paroxysmal SVT (supraventricular tachycardia) (HCC)   Essential hypertension   Closed pelvic fracture (HCC)   Leukocytosis   Fall at home, initial encounter   Atrial fibrillation with RVR (HCC)   Aortic atherosclerosis (HCC)   Chronic atrial fibrillation (HCC)   Hypotension due to blood loss   Coagulopathy (HCC)   Chronic diastolic heart failure (HCC)  Multiple closed pelvic fractures: Nonoperative management recommended by orthopedics.  - Avoid narcotics as able due to recent ileus - Continue therapy evaluations: Anticipate DC to SNF.  Chronic atrial fibrillation with RVR:  - Appreciate cardiology assistance. BP and ventricular rate improving. Metoprolol  BID > 37.5mg  BID, off diltiazem gtt 9/17, still on amiodarone gtt.  - Continue eliquis  Chronic diastolic CHF: - Continue holding lasix, appears euvolemic. - Strict I/O, daily weights ordered  Acute hypoxic respiratory failure: Thought to be due to COPD exacerbation (h/o smoking), though there is no wheezing and no obvious history of COPD, lungs on CXR not hyperinflated.  - Supplemental oxygen as needed to  maintain SpO2 >88% - Weaning steroids, down to solumedrol  daily as of 9/16 > prednisone 9/18. - Bronchodilators, change albuterol to xopenex prn wheezing or dyspnea  Acute blood loss anemia: Thought to be related to pelvic fractures, baseline hgb is uncertain. - Required transfusions, latest 1u PRBCs 9/16, w/hgb up to 8.1, no active bleeding noted. - Monitor CBC daily, transfusion threshold /dl. STILL WAITING ON CBC FROM THIS MORNING - If hgb trends down again would stop eliquis, consider reimaging pelvis.   E. coli UTI:  - Bactrim x5 days starting 9/16. Monitor Cr, K. PCN allergy noted.  T2DM: Well-controlled, HbA1c 6.2%, in fact may be too tight of control given age and comorbidities.  - Holding metformin - Sensitive SSI, rarely requiring insulin now that tapering steroids.  Hypokalemia:  - Repleted to maintain K >4, Mg >2.  Ileus: CT abd/pelvis without obstruction consistent with ileus on 9/11. KUB 9/16 w/contrast in colon, no dilated bowel. Has begun having loose stools as of 9/17 while being treated with stimulant laxatives.  - Resolved, hold laxatives until loose stool resolves.  - Continue OOB, restart bowel regimen once able to prevent recurrence  s/p corneal transplant:  - Continue acyclovir  Overactive bladder: Chronic, stable - Continue myrbetriq  Slurred speech: Acute since my exam this morning. Per cardiology, exam is otherwise nonfocal.  - STAT CT head  DVT prophylaxis: Eliquis Code Status: Full Family Communication: None at bedside this AM Disposition Plan: Anticipate DC to SNF once stable off gtt's. Continues amiodarone gtt.   Consultants:   Orthopedics  Cardiology  Procedures:   None  Antimicrobials:  Acyclovir  Bactrim 9/16 - 9/21  Subjective: Pain controlled, has not noticed bleeding. Feels tired, but able  to eat and overall improving. No chest pain or palpitations.   Objective: Vitals:   03/29/17 0600 03/29/17 0732 03/29/17 0925  03/29/17 1222  BP: 97/76 110/63 126/68 116/81  Pulse: (!) 102 (!) 102    Resp: Temp: (!) 96.7 F (35.9 C) (!) 97.2 F (36.2 C)  (!) 97.2 F (36.2 C)  TempSrc: Axillary Oral  Oral  SpO2: 96% 99%    Weight:      Height:        Intake/Output Summary (Last 24 hours) at 03/29/17 1341 Last data filed at 03/29/17 0518  Gross per 24 hour  Intake            294.7 ml  Output              525 ml  Net           -230.3 ml   Filed Weights   03/20/17 0150 03/28/17 1800  Weight: 77.1 kg (170 lb) 90.7 kg (200 lb)    Gen: Pleasant elderly female in no distress Pulm: Non-labored breathing with supplemental O2. Clear to auscultation bilaterally, no crackles or wheezing.  CV: Irreg irreg, rate in 90-100's. No murmur, rub, or gallop. No JVD, no pedal edema. GI: Abdomen soft, non-tender, non-distended, with normoactive bowel sounds. No organomegaly or masses felt. Ext: Warm, no deformities Skin: No rashes, lesions no ulcers Neuro: Alert and oriented. Speech at time of my exam this morning was normal. No focal neurological deficits. Psych: Judgement and insight appear fair. Mood & affect appropriate.   Data Reviewed: I have personally reviewed following labs and imaging studies  CBC:  Recent Labs Lab 03/24/17 0338 03/25/17 0410 03/26/17 0426 03/27/17 0602 03/28/17 1000  WBC 17.5* 10.4 18.4* 14.0* 11.6*  NEUTROABS 14.2* 9.4* 16.5* 11.9* 9.6*  HGB 8.1* 7.4* 7.4* 7.7* 8.1*  HCT 25.1* 23.2* 23.0* 24.1* 25.4*  MCV 93.0 93.5 93.9 94.9 91.4  PLT 312 310 410* 454* 387   Basic Metabolic Panel:  Recent Labs Lab 03/25/17 0410 03/27/17 0602 03/27/17 1600 03/28/17 1000 03/29/17 0435  NA 137 136 135 136 136  K 3.9 3.6 3.9 3.0* 4.1  CL 103 102 100* 104 105  CO2 GLUCOSE 156* 176* 140* 177* 109*  BUN 21* 25* 30* 28* 26*  CREATININE 0.91 0.70 0.80 0.69 0.67  CALCIUM 8.2* 7.9* 8.3* 7.5* 7.8*  MG  --   --  2.0  --  2.1   GFR: Estimated Creatinine Clearance:  58.4 mL/min (by C-G formula based on SCr of 0.67 mg/dL). Liver Function Tests:  Recent Labs Lab 03/24/17 0338 03/25/17 0410  AST 26 21  ALT 22 23  ALKPHOS 53 53  BILITOT 1.4* 1.7*  PROT 5.8* 5.7*  ALBUMIN 2.9* 2.7*   No results for input(s): LIPASE, AMYLASE in the last 168 hours. No results for input(s): AMMONIA in the last 168 hours. Coagulation Profile: No results for input(s): INR, PROTIME in the last 168 hours. Cardiac Enzymes: No results for input(s): CKTOTAL, CKMB, CKMBINDEX, TROPONINI in the last 168 hours. BNP (last 3 results) No results for input(s): PROBNP in the last 8760 hours. HbA1C: No results for input(s): HGBA1C in the last 72 hours. CBG:  Recent Labs Lab 03/28/17 1939 03/28/17 2327 03/29/17 0317 03/29/17 0730 03/29/17 1215  GLUCAP 157* 123* 113* 113* 116*   Lipid Profile: No results for input(s): CHOL, HDL, LDLCALC, TRIG, CHOLHDL, LDLDIRECT in the last 72 hours. Thyroid Function Tests:  No results for input(s): TSH, T4TOTAL, FREET4, T3FREE, THYROIDAB in the last 72 hours. Anemia Panel: No results for input(s): VITAMINB12, FOLATE, FERRITIN, TIBC, IRON, RETICCTPCT in the last 72 hours. Urine analysis:    Component Value Date/Time   COLORURINE YELLOW 03/19/2017 2337   APPEARANCEUR HAZY (A) 03/19/2017 2337   LABSPEC 1.018 03/19/2017 2337   PHURINE 5.0 03/19/2017 2337   GLUCOSEU NEGATIVE 03/19/2017 2337   HGBUR NEGATIVE 03/19/2017 2337   BILIRUBINUR NEGATIVE 03/19/2017 2337   KETONESUR 5 (A) 03/19/2017 2337   PROTEINUR NEGATIVE 03/19/2017 2337   UROBILINOGEN 0.2 03/19/2015 1945   NITRITE POSITIVE (A) 03/19/2017 2337   LEUKOCYTESUR MODERATE (A) 03/19/2017 2337   Recent Results (from the past 240 hour(s))  Surgical pcr screen     Status: None   Collection Time: 03/20/17  1:50 AM  Result Value Ref Range Status   MRSA, PCR NEGATIVE NEGATIVE Final   Staphylococcus aureus NEGATIVE NEGATIVE Final    Comment: (NOTE) The Xpert SA Assay (FDA approved  for NASAL specimens in patients 81 years of age and older), is one component of a comprehensive surveillance program. It is not intended to diagnose infection nor to guide or monitor treatment.   Culture, Urine     Status: Abnormal   Collection Time: 03/24/17 11:29 AM  Result Value Ref Range Status   Specimen Description URINE, CATHETERIZED  Final   Special Requests NONE  Final   Culture >=100,000 COLONIES/mL ESCHERICHIA COLI (A)  Final   Report Status 03/26/2017 FINAL  Final   Organism ID, Bacteria ESCHERICHIA COLI (A)  Final      Susceptibility   Escherichia coli - MIC*    AMPICILLIN 4 SENSITIVE Sensitive     CEFAZOLIN <=4 SENSITIVE Sensitive     CEFTRIAXONE <=1 SENSITIVE Sensitive     CIPROFLOXACIN <=0.25 SENSITIVE Sensitive     GENTAMICIN <=1 SENSITIVE Sensitive     IMIPENEM <=0.25 SENSITIVE Sensitive     NITROFURANTOIN <=16 SENSITIVE Sensitive     TRIMETH/SULFA <=20 SENSITIVE Sensitive     AMPICILLIN/SULBACTAM <=2 SENSITIVE Sensitive     PIP/TAZO <=4 SENSITIVE Sensitive     Extended ESBL NEGATIVE Sensitive     * >=100,000 COLONIES/mL ESCHERICHIA COLI      Radiology Studies: No results found.  Scheduled Meds: . acyclovir  800 mg Oral BID  . apixaban  5 mg Oral BID  . atorvastatin  40 mg Oral Daily  . insulin aspart  0-9 Units Subcutaneous TID WC  . metoprolol tartrate  37.5 mg Oral BID  . mirabegron ER  25 mg Oral Daily  . multivitamin with minerals  1 tablet Oral Daily  . pantoprazole  40 mg Oral Daily  . prednisoLONE acetate  1 drop Right Eye BID  . predniSONE  40 mg Oral Q breakfast  . sodium chloride flush  10-40 mL Intracatheter Q12H  . sulfamethoxazole-trimethoprim  1 tablet Oral Q12H   Continuous Infusions: . amiodarone 30 mg/hr (03/29/17 0053)     LOS: 9 days   Time spent: 35 minutes.  Hazeline Junker, MD [facility] Pager [phone]  If 7PM-7AM, please contact night-coverage [email or web address] Password [email or web address] 03/29/2017, 1:41 PM

## 2017-03-30 LAB — PTH, INTACT AND CALCIUM
Calcium, Total (PTH): 7.7 mg/dL — ABNORMAL LOW (ref 8.7–10.3)
PTH: 92 pg/mL — ABNORMAL HIGH (ref 15–65)

## 2017-03-30 LAB — BASIC METABOLIC PANEL
Anion gap: 3 — ABNORMAL LOW (ref 5–15)
BUN: 22 mg/dL — AB (ref 6–20)
CALCIUM: 7.9 mg/dL — AB (ref 8.9–10.3)
CHLORIDE: 104 mmol/L (ref 101–111)
CO2: 31 mmol/L (ref 22–32)
CREATININE: 0.64 mg/dL (ref 0.44–1.00)
GFR calc Af Amer: >60 mL/min (ref >60)
GFR calc non Af Amer: >60 mL/min (ref >60)
GLUCOSE: 156 mg/dL — AB (ref 65–99)
Potassium: 4.1 mmol/L (ref 3.5–5.1)
Sodium: 138 mmol/L (ref 135–145)

## 2017-03-30 LAB — CBC
HCT: 30.3 % — ABNORMAL LOW (ref 36.0–46.0)
Hemoglobin: 9.4 g/dL — ABNORMAL LOW (ref 12.0–15.0)
MCH: 29 pg (ref 26.0–34.0)
MCHC: 31 g/dL (ref 30.0–36.0)
MCV: 93.5 fL (ref 78.0–100.0)
PLATELETS: 492 10*3/uL — AB (ref 150–400)
RBC: 3.24 MIL/uL — ABNORMAL LOW (ref 3.87–5.11)
RDW: 18.1 % — AB (ref 11.5–15.5)
WBC: 17.1 10*3/uL — ABNORMAL HIGH (ref 4.0–10.5)

## 2017-03-30 LAB — GLUCOSE, CAPILLARY
GLUCOSE-CAPILLARY: 126 mg/dL — AB (ref 65–99)
GLUCOSE-CAPILLARY: 165 mg/dL — AB (ref 65–99)
GLUCOSE-CAPILLARY: 168 mg/dL — AB (ref 65–99)
GLUCOSE-CAPILLARY: 183 mg/dL — AB (ref 65–99)
Glucose-Capillary: 107 mg/dL — ABNORMAL HIGH (ref 65–99)
Glucose-Capillary: 219 mg/dL — ABNORMAL HIGH (ref 65–99)

## 2017-03-30 MED ORDER — DILTIAZEM HCL 60 MG PO TABS
90.0000 mg | ORAL_TABLET | Freq: Three times a day (TID) | ORAL | Status: DC
  Administered 2017-03-30 – 2017-03-31 (×2): 90 mg via ORAL
  Filled 2017-03-30 (×2): qty 1

## 2017-03-30 MED ORDER — LOPERAMIDE HCL 2 MG PO CAPS: 2.0000 mg | ORAL_CAPSULE | ORAL | Status: DC | PRN

## 2017-03-30 MED ORDER — DILTIAZEM HCL 60 MG PO TABS
120.0000 mg | ORAL_TABLET | Freq: Three times a day (TID) | ORAL | Status: DC
  Administered 2017-03-30 (×2): 120 mg via ORAL
  Filled 2017-03-30 (×2): qty 2

## 2017-03-30 MED ORDER — AMIODARONE HCL 200 MG PO TABS
200.0000 mg | ORAL_TABLET | Freq: Every day | ORAL | Status: DC
  Administered 2017-03-30 – 2017-03-31 (×2): 200 mg via ORAL
  Filled 2017-03-30 (×2): qty 1

## 2017-03-30 NOTE — Progress Notes (Signed)
PROGRESS NOTE  Tracey Porter  WUJ:[id number] DOB: 07-09-1930 DOA: 03/19/2017 PCP: [place], Julie M, NP   Brief Narrative: Tracey Porter is a 83 y.o. female who presented after fall found to have multiple closed pelvic fractures and AFib w/RVR. Orthopedic surgery recommended nonoperative management. Cardiology was consulted for difficult rate control, which is improving. She has been hypoxic undergoing treatment for COPD exacerbation, V/Q scan without acute findings. Her mentation has improved with treatment, CT head was negative. She developed ileus which has resolved with loose stools starting 9/17.   Assessment & Plan: Principal Problem:   Multiple fractures of pelvis with unstable disruption of pelvic ring, initial encounter for closed fracture (HCC) Active Problems:   DM2 (diabetes mellitus, type 2) (HCC)   Paroxysmal SVT (supraventricular tachycardia) (HCC)   Essential hypertension   Closed pelvic fracture (HCC)   Leukocytosis   Fall at home, initial encounter   Atrial fibrillation with RVR (HCC)   Aortic atherosclerosis (HCC)   Chronic atrial fibrillation (HCC)   Hypotension due to blood loss   Coagulopathy (HCC)   Chronic diastolic heart failure (HCC)  Multiple closed pelvic fractures: Nonoperative management recommended by orthopedics.  - Avoid narcotics as able due to recent ileus - Continue therapy evaluations: Anticipate DC to SNF.  Chronic atrial fibrillation with RVR: BP and ventricular rate improving.  - Metoprolol  BID > 37.5mg  BID, off diltiazem gtt 9/17.  - Amiodarone gtt >  po daily - Restart diltiazem at lower dose,  TID. - Continue eliquis  Chronic diastolic CHF: - Ok to restart lasix in AM. - Strict I/O, daily weights ordered  Acute hypoxic respiratory failure: Thought to be due to COPD exacerbation (h/o smoking), though there is no wheezing and no obvious history of COPD, lungs on CXR not hyperinflated.  - Supplemental oxygen as needed to  maintain SpO2 >88% - Weaning steroids, down to solumedrol  daily as of 9/16 > prednisone 9/18. - Bronchodilators, change albuterol to xopenex prn wheezing or dyspnea  Acute blood loss anemia: Thought to be related to pelvic fractures, baseline hgb is uncertain. - Required transfusions, latest 1u PRBCs 9/16, w/hgb up to 8.1 > 9.4, no active bleeding noted. - If hgb trends down again would stop eliquis, consider reimaging pelvis.   E. coli UTI:  - Bactrim x5 days starting 9/16. Monitor Cr, K. PCN allergy noted.  T2DM: Well-controlled, HbA1c 6.2%, in fact may be too tight of control given age and comorbidities.  - Holding metformin - Sensitive SSI, rarely requiring insulin now that tapering steroids.  Hypokalemia:  - Repleted to maintain K >4, Mg >2.  Ileus: CT abd/pelvis without obstruction consistent with ileus on 9/11. KUB 9/16 w/contrast in colon, no dilated bowel. Has begun having loose stools as of 9/17 while being treated with stimulant laxatives.  - Resolved, hold laxatives until loose stool resolves.  - Continue OOB, restart bowel regimen once able to prevent recurrence  s/p corneal transplant:  - Continue acyclovir  Overactive bladder: Chronic, stable - Continue myrbetriq  Slurred speech: Transient, resolved without intervention, CT head repeated and negative, suspect due to delirium.   DVT prophylaxis: Eliquis Code Status: Full Family Communication: None at bedside this AM Disposition Plan: Transfer to telemetry, may be stable for DC to SNF 9/20.   Consultants:   Orthopedics  Cardiology  Procedures:   None  Antimicrobials:  Acyclovir  Bactrim 9/16 - 9/21  Subjective: Feeling better, pain controlled, unsteady with transfer to chair today. Stool output was just a smear  today. No abdominal pain or fever.   Objective: Vitals:   03/30/17 0400 03/30/17 0800 03/30/17 1200 03/30/17 1626  BP: 127/83 110/67 (!) 88/67 106/87  Pulse: (!) 119 (!) 115    Resp:  18 18    Temp:  (!) 97.4 F (36.3 C) (!) 97.5 F (36.4 C) 98.2 F (36.8 C)  TempSrc:  Oral Oral Oral  SpO2: 94% 91%    Weight:      Height:        Intake/Output Summary (Last 24 hours) at 03/30/17 1745 Last data filed at 03/30/17 1241  Gross per 24 hour  Intake                0 ml  Output              750 ml  Net             -750 ml   Filed Weights   03/20/17 0150 03/28/17 1800  Weight: 77.1 kg (170 lb) 90.7 kg (200 lb)    Gen: Pleasant, unkempt elderly female in no distress Pulm: Non-labored breathing room air. Clear to auscultation bilaterally, no crackles or wheezing.  CV: Irreg irreg, rate in 100's. No murmur, rub, or gallop. No JVD, no pedal edema. GI: Abdomen soft, non-tender, non-distended, with normoactive bowel sounds. No organomegaly or masses felt. Ext: Warm, no deformities Skin: No rashes, lesions no ulcers Neuro: Alert and oriented. Speech without aphasia. No focal neurological deficits. Psych: Judgement and insight appear fair. Mood & affect appropriate.   Data Reviewed: I have personally reviewed following labs and imaging studies  CBC:  Recent Labs Lab 03/24/17 0338 03/25/17 0410 03/26/17 0426 03/27/17 0602 03/28/17 1000 03/29/17 1525 03/30/17 0421  WBC 17.5* 10.4 18.4* 14.0* 11.6* 16.1* 17.1*  NEUTROABS 14.2* 9.4* 16.5* 11.9* 9.6*  --   --   HGB 8.1* 7.4* 7.4* 7.7* 8.1* 9.0* 9.4*  HCT 25.1* 23.2* 23.0* 24.1* 25.4* 28.8* 30.3*  MCV 93.0 93.5 93.9 94.9 91.4 93.5 93.5  PLT 312 310 410* 454* 387 482* 492*   Basic Metabolic Panel:  Recent Labs Lab 03/27/17 0602 03/27/17 1600 03/28/17 1000 03/29/17 0435 03/30/17 0421  NA 136 135 136 136 138  K 3.6 3.9 3.0* 4.1 4.1  CL 102 100* 104 105 104  CO2 GLUCOSE 176* 140* 177* 109* 156*  BUN 25* 30* 28* 26* 22*  CREATININE 0.70 0.80 0.69 0.67 0.64  CALCIUM 7.9* 8.3* 7.5* 7.8*  7.7* 7.9*  MG  --  2.0  --  2.1  --    GFR: Estimated Creatinine Clearance: 58.4 mL/min (by C-G formula  based on SCr of 0.64 mg/dL). Liver Function Tests:  Recent Labs Lab 03/24/17 0338 03/25/17 0410  AST 26 21  ALT 22 23  ALKPHOS 53 53  BILITOT 1.4* 1.7*  PROT 5.8* 5.7*  ALBUMIN 2.9* 2.7*   No results for input(s): LIPASE, AMYLASE in the last 168 hours. No results for input(s): AMMONIA in the last 168 hours. Coagulation Profile: No results for input(s): INR, PROTIME in the last 168 hours. Cardiac Enzymes: No results for input(s): CKTOTAL, CKMB, CKMBINDEX, TROPONINI in the last 168 hours. BNP (last 3 results) No results for input(s): PROBNP in the last 8760 hours. HbA1C: No results for input(s): HGBA1C in the last 72 hours. CBG:  Recent Labs Lab 03/29/17 2330 03/30/17 0340 03/30/17 0750 03/30/17 1237 03/30/17 1634  GLUCAP 138* 126* 107* 219* 183*   Lipid Profile: No  results for input(s): CHOL, HDL, LDLCALC, TRIG, CHOLHDL, LDLDIRECT in the last 72 hours. Thyroid Function Tests: No results for input(s): TSH, T4TOTAL, FREET4, T3FREE, THYROIDAB in the last 72 hours. Anemia Panel: No results for input(s): VITAMINB12, FOLATE, FERRITIN, TIBC, IRON, RETICCTPCT in the last 72 hours. Urine analysis:    Component Value Date/Time   COLORURINE YELLOW 03/19/2017 2337   APPEARANCEUR HAZY (A) 03/19/2017 2337   LABSPEC 1.018 03/19/2017 2337   PHURINE 5.0 03/19/2017 2337   GLUCOSEU NEGATIVE 03/19/2017 2337   HGBUR NEGATIVE 03/19/2017 2337   BILIRUBINUR NEGATIVE 03/19/2017 2337   KETONESUR 5 (A) 03/19/2017 2337   PROTEINUR NEGATIVE 03/19/2017 2337   UROBILINOGEN 0.2 03/19/2015 1945   NITRITE POSITIVE (A) 03/19/2017 2337   LEUKOCYTESUR MODERATE (A) 03/19/2017 2337   Recent Results (from the past 240 hour(s))  Culture, Urine     Status: Abnormal   Collection Time: 03/24/17 11:29 AM  Result Value Ref Range Status   Specimen Description URINE, CATHETERIZED  Final   Special Requests NONE  Final   Culture >=100,000 COLONIES/mL ESCHERICHIA COLI (A)  Final   Report Status  03/26/2017 FINAL  Final   Organism ID, Bacteria ESCHERICHIA COLI (A)  Final      Susceptibility   Escherichia coli - MIC*    AMPICILLIN 4 SENSITIVE Sensitive     CEFAZOLIN <=4 SENSITIVE Sensitive     CEFTRIAXONE <=1 SENSITIVE Sensitive     CIPROFLOXACIN <=0.25 SENSITIVE Sensitive     GENTAMICIN <=1 SENSITIVE Sensitive     IMIPENEM <=0.25 SENSITIVE Sensitive     NITROFURANTOIN <=16 SENSITIVE Sensitive     TRIMETH/SULFA <=20 SENSITIVE Sensitive     AMPICILLIN/SULBACTAM <=2 SENSITIVE Sensitive     PIP/TAZO <=4 SENSITIVE Sensitive     Extended ESBL NEGATIVE Sensitive     * >=100,000 COLONIES/mL ESCHERICHIA COLI      Radiology Studies: Ct Head Wo Contrast  Result Date: 03/29/2017 CLINICAL DATA:  Slurred speech today. EXAM: CT HEAD WITHOUT CONTRAST TECHNIQUE: Contiguous axial images were obtained from the base of the skull through the vertex without intravenous contrast. COMPARISON:  Head CT scan 03/24/2017. FINDINGS: Brain: Cortical atrophy and chronic microvascular ischemic change are identified. No evidence of acute intracranial abnormality including hemorrhage, infarct, mass lesion, mass effect, midline shift or abnormal extra-axial fluid collection. No hydrocephalus or pneumocephalus. Vascular: Atherosclerosis noted. Skull: Intact. Sinuses/Orbits: Negative. Other: None. IMPRESSION: No acute abnormality. Atrophy and chronic microvascular ischemic change. Electronically Signed   By: Drusilla Kanner M.D.   On: 03/29/2017 16:30    Scheduled Meds: . acyclovir  800 mg Oral BID  . amiodarone  200 mg Oral Daily  . apixaban  5 mg Oral BID  . atorvastatin  40 mg Oral Daily  . diltiazem  120 mg Oral Q8H  . insulin aspart  0-9 Units Subcutaneous TID WC  . metoprolol tartrate  37.5 mg Oral BID  . mirabegron ER  25 mg Oral Daily  . multivitamin with minerals  1 tablet Oral Daily  . pantoprazole  40 mg Oral Daily  . prednisoLONE acetate  1 drop Right Eye BID  . predniSONE  40 mg Oral Q breakfast   . sodium chloride flush  10-40 mL Intracatheter Q12H  . sulfamethoxazole-trimethoprim  1 tablet Oral Q12H   Continuous Infusions:    LOS: 10 days   Time spent: 35 minutes.  Hazeline Junker, MD [facility] Pager [phone]  If 7PM-7AM, please contact night-coverage [email or web address] Password [facility] 03/30/2017, 5:45 PM

## 2017-03-30 NOTE — Progress Notes (Addendum)
Physical Therapy Treatment Patient Details Name: Tracey Porter MRN: [id number] DOB: 11-05-1929 Today's Date: 03/30/2017    History of Present Illness Pt is an 83 yo female admitted after a fall with pelvic fx w associated hemorrhage.  Pt with mild dementia, h/o afib, currently in afib with RVR. Pt was in traction but now out of traction and attempting conservative management with TDWB on LLE and WBAT RLE.    PT Comments    Pt looking better today. Agreeable to bed to recliner transfer. Declining initiation of gait training today, stating she's just not ready.  Pivot transfer performed toward right with +2 max assist. Pt tachy with max HR of 153. HR 128 at rest in recliner at end of session. Continue per POC.     Follow Up Recommendations  SNF;Supervision for mobility/OOB     Equipment Recommendations  Rolling walker with 5" wheels    Recommendations for Other Services       Precautions / Restrictions Precautions Precautions: Fall Restrictions Weight Bearing Restrictions: Yes RLE Weight Bearing: Weight bearing as tolerated LLE Weight Bearing: Touchdown weight bearing    Mobility  Bed Mobility Overal bed mobility: Needs Assistance       Supine to sit: HOB elevated;Max assist     General bed mobility comments: verbal cues for sequencing, increased time to stabilize balance EOB  Transfers       Sit to Stand: Max assist;+2 physical assistance Stand pivot transfers: Max assist;+2 physical assistance       General transfer comment: pivot transfer toward right bed to recliner. Verbal/tactile cues for sequencing.  Ambulation/Gait             General Gait Details: Unable   Stairs            Wheelchair Mobility    Modified Rankin (Stroke Patients Only)       Balance   Sitting-balance support: Feet supported;Bilateral upper extremity supported Sitting balance-Leahy Scale: Fair Sitting balance - Comments: min guard assist EOB sitting     Standing balance support: Bilateral upper extremity supported;During functional activity Standing balance-Leahy Scale: Poor Standing balance comment: Depending on UE support and external assist for standing balance.                            Cognition Arousal/Alertness: Awake/alert Behavior During Therapy: WFL for tasks assessed/performed Overall Cognitive Status: History of cognitive impairments - at baseline                                        Exercises      General Comments        Pertinent Vitals/Pain Pain Assessment: Faces Faces Pain Scale: Hurts even more Pain Location: LLE with mobility Pain Descriptors / Indicators: Discomfort;Grimacing;Guarding Pain Intervention(s): Limited activity within patient's tolerance;Monitored during session;Repositioned    Home Living                      Prior Function            PT Goals (current goals can now be found in the care plan section) Acute Rehab PT Goals Patient Stated Goal: home PT Goal Formulation: With patient Time For Goal Achievement: 04/04/17 Potential to Achieve Goals: Fair Progress towards PT goals: Progressing toward goals    Frequency    Min 3X/week  PT Plan Current plan remains appropriate    Co-evaluation              AM-PAC PT "6 Clicks" Daily Activity  Outcome Measure  Difficulty turning over in bed (including adjusting bedclothes, sheets and blankets)?: Unable Difficulty moving from lying on back to sitting on the side of the bed? : Unable Difficulty sitting down on and standing up from a chair with arms (e.g., wheelchair, bedside commode, etc,.)?: Unable Help needed moving to and from a bed to chair (including a wheelchair)?: A Lot Help needed walking in hospital room?: Total Help needed climbing 3-5 steps with a railing? : Total 6 Click Score: 7    End of Session Equipment Utilized During Treatment: Gait belt Activity Tolerance: Patient  tolerated treatment well Patient left: in chair;with chair alarm set;with call bell/phone within reach;with family/visitor present Nurse Communication: Mobility status PT Visit Diagnosis: Unsteadiness on feet (R26.81);Dizziness and giddiness (R42);Muscle weakness (generalized) (M62.81);Pain Pain - Right/Left: Left Pain - part of body: Hip;Leg     Time: 2130-8657 PT Time Calculation (min) (ACUTE ONLY): 23 min  Charges:  $Therapeutic Activity: 23-37 mins                    G Codes:       Aida Raider, PT  Office # [phone] Pager [phone]    Ilda Foil 03/30/2017, 11:24 AM

## 2017-03-30 NOTE — Progress Notes (Signed)
Progress Note  Patient Name: Tracey Porter Date of Encounter: 03/30/2017  Primary Cardiologist: Tresa Endo   Subjective    83 yo with hx of HTN, DM, persistent AF .  Admitted after having a pelvic fracture   Is on Amio, metoprolol and low dos Cardiazem  Still in AF,  Rate is better controlled - although she still has some episodes of tachycardia   Inpatient Medications    Scheduled Meds: . acyclovir  800 mg Oral BID  . amiodarone  200 mg Oral Daily  . apixaban  5 mg Oral BID  . atorvastatin  40 mg Oral Daily  . diltiazem  120 mg Oral Q8H  . insulin aspart  0-9 Units Subcutaneous TID WC  . metoprolol tartrate  37.5 mg Oral BID  . mirabegron ER  25 mg Oral Daily  . multivitamin with minerals  1 tablet Oral Daily  . pantoprazole  40 mg Oral Daily  . prednisoLONE acetate  1 drop Right Eye BID  . predniSONE  40 mg Oral Q breakfast  . sodium chloride flush  10-40 mL Intracatheter Q12H  . sulfamethoxazole-trimethoprim  1 tablet Oral Q12H   Continuous Infusions:  PRN Meds: acetaminophen, bisacodyl, ketorolac, levalbuterol, ondansetron **OR** ondansetron (ZOFRAN) IV, sodium chloride flush   Vital Signs    Vitals:   03/30/17 0300 03/30/17 0347 03/30/17 0400 03/30/17 0800  BP: 134/76  127/83 110/67  Pulse: (!) 101  (!) 119 (!) 115  Resp: Temp:  98.5 F (36.9 C)  (!) 97.4 F (36.3 C)  TempSrc:  Oral  Oral  SpO2: 95%  94% 91%  Weight:      Height:        Intake/Output Summary (Last 24 hours) at 03/30/17 1104 Last data filed at 03/30/17 0806  Gross per 24 hour  Intake           722.62 ml  Output             1000 ml  Net          -277.38 ml   Filed Weights   03/20/17 0150 03/28/17 1800  Weight: 170 lb (77.1 kg) 200 lb (90.7 kg)    Telemetry    Atrial fib - HR 90s - 110  - Personally Reviewed  ECG    AF - Personally Reviewed  Physical Exam   GEN: No acute distress.  Elderly female , NAD  Neck: No JVD Cardiac: Irreg. Irreg. , normal S1S2    Respiratory: Clear to auscultation bilaterally. GI: Soft, nontender, non-distended  MS: No edema; No deformity. Neuro:  Nonfocal  Psych: Normal affect   Labs    Chemistry Recent Labs Lab 03/24/17 0338 03/25/17 0410  03/28/17 1000 03/29/17 0435 03/30/17 0421  NA 137 137  < > 136 136 138  K 3.9 3.9  < > 3.0* 4.1 4.1  CL 102 103  < > 104 105 104  CO2 27 28  < > GLUCOSE 152* 156*  < > 177* 109* 156*  BUN 21* 21*  < > 28* 26* 22*  CREATININE 0.78 0.91  < > 0.69 0.67 0.64  CALCIUM 8.4* 8.2*  < > 7.5* 7.8*  7.7* 7.9*  PROT 5.8* 5.7*  --   --   --   --   ALBUMIN 2.9* 2.7*  --   --   --   --   AST 26 21  --   --   --   --  ALT 22 23  --   --   --   --   ALKPHOS 53 53  --   --   --   --   BILITOT 1.4* 1.7*  --   --   --   --   GFRNONAA >60 56*  < > >60 >60 >60  GFRAA >60 >60  < > >60 >60 >60  ANIONGAP 8 6  < > 6 3* 3*  < > = values in this interval not displayed.   Hematology Recent Labs Lab 03/28/17 1000 03/29/17 1525 03/30/17 0421  WBC 11.6* 16.1* 17.1*  RBC 2.78* 3.08* 3.24*  HGB 8.1* 9.0* 9.4*  HCT 25.4* 28.8* 30.3*  MCV 91.4 93.5 93.5  MCH 29.1 29.2 29.0  MCHC 31.9 31.3 31.0  RDW 18.2* 18.3* 18.1*  PLT 387 482* 492*    Cardiac EnzymesNo results for input(s): TROPONINI in the last 168 hours. No results for input(s): TROPIPOC in the last 168 hours.   BNP Recent Labs Lab 03/24/17 1148  BNP 373.3*     DDimer No results for input(s): DDIMER in the last 168 hours.   Radiology    Ct Head Wo Contrast  Result Date: 03/29/2017 CLINICAL DATA:  Slurred speech today. EXAM: CT HEAD WITHOUT CONTRAST TECHNIQUE: Contiguous axial images were obtained from the base of the skull through the vertex without intravenous contrast. COMPARISON:  Head CT scan 03/24/2017. FINDINGS: Brain: Cortical atrophy and chronic microvascular ischemic change are identified. No evidence of acute intracranial abnormality including hemorrhage, infarct, mass lesion, mass effect,  midline shift or abnormal extra-axial fluid collection. No hydrocephalus or pneumocephalus. Vascular: Atherosclerosis noted. Skull: Intact. Sinuses/Orbits: Negative. Other: None. IMPRESSION: No acute abnormality. Atrophy and chronic microvascular ischemic change. Electronically Signed   By: Drusilla Kanner M.D.   On: 03/29/2017 16:30    Cardiac Studies      Patient Profile     83 y.o. female   Assessment & Plan    1. Atrial fib:   HR is better Have changed the IV amio to PO amio Continue dose of metoprolol and diltiazem  Continue Eliquis   She may be discharged from cardiology standpoint  Continue titration of the metoprolol , diltiazem as needed / tolerated.   Will sign off. Call for questions   For questions or updates, please contact [facility] Please consult [email or web address] for contact info under Cardiology/STEMI.      Signed, Kristeen Miss, MD  03/30/2017, 11:04 AM

## 2017-03-31 DIAGNOSIS — N390 Urinary tract infection, site not specified: Secondary | ICD-10-CM

## 2017-03-31 DIAGNOSIS — I272 Pulmonary hypertension, unspecified: Secondary | ICD-10-CM

## 2017-03-31 DIAGNOSIS — Z1612 Extended spectrum beta lactamase (ESBL) resistance: Secondary | ICD-10-CM

## 2017-03-31 DIAGNOSIS — S32301A Unspecified fracture of right ilium, initial encounter for closed fracture: Secondary | ICD-10-CM

## 2017-03-31 DIAGNOSIS — J441 Chronic obstructive pulmonary disease with (acute) exacerbation: Secondary | ICD-10-CM

## 2017-03-31 DIAGNOSIS — B9629 Other Escherichia coli [E. coli] as the cause of diseases classified elsewhere: Secondary | ICD-10-CM

## 2017-03-31 LAB — GLUCOSE, CAPILLARY: Glucose-Capillary: 122 mg/dL — ABNORMAL HIGH (ref 65–99)

## 2017-03-31 MED ORDER — DILTIAZEM HCL ER COATED BEADS 240 MG PO CP24: 240.0000 mg | ORAL_CAPSULE | Freq: Every day | ORAL | 3 refills | Status: DC

## 2017-03-31 MED ORDER — LOPERAMIDE HCL 2 MG PO CAPS: 2.0000 mg | ORAL_CAPSULE | ORAL | 0 refills | Status: AC | PRN

## 2017-03-31 MED ORDER — IBUPROFEN 600 MG PO TABS: 600.0000 mg | ORAL_TABLET | Freq: Four times a day (QID) | ORAL | 0 refills | Status: DC | PRN

## 2017-03-31 MED ORDER — AMIODARONE HCL 200 MG PO TABS: 200.0000 mg | ORAL_TABLET | Freq: Every day | ORAL | Status: DC

## 2017-03-31 NOTE — Discharge Summary (Signed)
Physician Discharge Summary  Tracey Porter ZOX:[id number] DOB: 01-06-1930 DOA: 03/19/2017  PCP: [place], Julie M, NP  Admit date: 03/19/2017 Discharge date: 03/31/2017  Admitted From: Home Disposition: SNF   Recommendations for Outpatient Follow-up:  1. Follow up with PCP in 1-2 weeks 2. Follow up with orthopedics in about 10 days 3. Monitor HR and BP, meds as below for AFib.   Home Health: N/A Equipment/Devices: Per SNF Discharge Condition: Stable CODE STATUS: Full Diet recommendation: Heart healthy, carb-modified  Brief/Interim Summary: Tracey Porter is a 83 y.o. female who presented after fall found to have multiple closed pelvic fractures and AFib w/RVR. Orthopedic surgery recommended nonoperative management. Cardiology was consulted for difficult rate control, which is improving. She has been hypoxic undergoing treatment for COPD exacerbation, V/Q scan without acute findings. Her mentation has improved with treatment, CT head was negative. She developed ileus which has resolved. After significant laxative administration, she began having loose stools 9/17 which has improved as of 9/19. Heart rate control was difficult, requiring amiodarone and diltiazem infusions. Cardiology was consulted and regimen was changed to oral amiodarone, metoprolol, diltiazem as below with ongoing stability.   Discharge Diagnoses:  Principal Problem:   Multiple fractures of pelvis with unstable disruption of pelvic ring, initial encounter for closed fracture (HCC) Active Problems:   DM2 (diabetes mellitus, type 2) (HCC)   Paroxysmal SVT (supraventricular tachycardia) (HCC)   Essential hypertension   Closed pelvic fracture (HCC)   Leukocytosis   Fall at home, initial encounter   Atrial fibrillation with RVR (HCC)   Aortic atherosclerosis (HCC)   Chronic atrial fibrillation (HCC)   Hypotension due to blood loss   Coagulopathy (HCC)   Chronic diastolic heart failure (HCC)  Multiple closed pelvic  fractures: Nonoperative management recommended by orthopedics.  - Avoid narcotics as able due to recent ileus: Has not used any more than toradol and tylenol for several days. Recommend continuing this.  - Continue therapy at SNF  Chronic atrial fibrillation with RVR: BP and ventricular rate improving.  - Metoprolol 25mg  BID > 37.5mg  BID; Ok to restart labetalol at discharge, off diltiazem gtt 9/17.  - Amiodarone gtt > 200mg  po daily - Restart diltiazem at lower dose, 90mg  TID. - Continue eliquis  Chronic diastolic CHF: - Initially held lasix, now restarted home dose. - Monitor Cr, I/O  Acute hypoxic respiratory failure: Resolved. Thought to be due to COPD exacerbation (h/o smoking), though there is no wheezing and no obvious history of COPD, lungs on CXR not hyperinflated.  - Weaned steroids  Acute blood loss anemia: Thought to be related to pelvic fractures, baseline hgb is uncertain. - Required transfusions, latest 1u PRBCs 9/16, w/hgb up to 8.1 > 9.4, no active bleeding noted. - Follow up CBC in 1 week  E. coli UTI:  - s/p bactrim x5 days starting 9/16. PCN allergy noted.  T2DM: Well-controlled, HbA1c 6.2%, in fact may be too tight of control given age and comorbidities.  - Restart metformin only. - Required insulin with steroids, which have been tapered.  Hypokalemia:  - Repleted to maintain K >4, Mg >2.  Ileus: CT abd/pelvis without obstruction consistent with ileus on 9/11. KUB 9/16 w/contrast in colon, no dilated bowel. Has begun having loose stools as of 9/17 while being treated with stimulant laxatives.  - Resolved, holding laxatives - Continue OOB  s/p corneal transplant:  - Continue acyclovir  Overactive bladder: Chronic, stable - Continue myrbetriq - Had foley catheterization during this admission. Voiding spontaneously prior to discharge.  Slurred speech: Transient, resolved without intervention, CT head repeated and negative, suspect due to  delirium.   Discharge Instructions  Allergies as of 03/31/2017      Reactions   Penicillins Rash      Medication List    TAKE these medications   acyclovir 400 MG tablet Commonly known as:  ZOVIRAX Take 800 mg by mouth 2 (two) times daily.   alendronate 70 MG tablet Commonly known as:  FOSAMAX Take 70 mg by mouth once a week.   amiodarone 200 MG tablet Commonly known as:  PACERONE Take 1 tablet (200 mg total) by mouth daily.   apixaban 5 MG Tabs tablet Commonly known as:  ELIQUIS Take 1 tablet (5 mg total) by mouth 2 (two) times daily. Please cancel previous rx's sent; call pt with price before filling   atorvastatin 40 MG tablet Commonly known as:  LIPITOR Take 1 tablet (40 mg total) by mouth daily.   diltiazem 240 MG 24 hr capsule Commonly known as:  CARDIZEM CD Take 1 capsule (240 mg total) by mouth daily. What changed:  medication strength  how much to take   furosemide 20 MG tablet Commonly known as:  LASIX Take 1 tablet (20 mg total) by mouth daily.   labetalol 300 MG tablet Commonly known as:  NORMODYNE Take 1 tablet (300 mg total) by mouth 2 (two) times daily. Please cancel previous rx's sent   loperamide 2 MG capsule Commonly known as:  IMODIUM Take 1 capsule (2 mg total) by mouth as needed for diarrhea or loose stools.   metFORMIN 500 MG 24 hr tablet Commonly known as:  GLUCOPHAGE-XR Take 500 mg by mouth every morning.   MYRBETRIQ 25 MG Tb24 tablet Generic drug:  mirabegron ER Take 25 mg by mouth daily.   nitroGLYCERIN 0.4 MG SL tablet Commonly known as:  NITROSTAT Place 1 tablet (0.4 mg total) under the tongue every 5 (five) minutes as needed for chest pain.   potassium chloride 10 MEQ tablet Commonly known as:  K-DUR Take 1 tablet (10 mEq total) by mouth daily.   prednisoLONE acetate 1 % ophthalmic suspension Commonly known as:  PRED FORTE Place 1 drop into the right eye 4 (four) times daily.            Discharge Care  Instructions        Start     Ordered   03/31/17 0000  amiodarone (PACERONE) 200 MG tablet  Daily     03/31/17 0932   03/31/17 0000  diltiazem (CARDIZEM CD) 240 MG 24 hr capsule  Daily     03/31/17 0932   03/31/17 0000  loperamide (IMODIUM) 2 MG capsule  As needed     03/31/17 0932      Contact information for follow-up providers    Haddix, Gillie Manners, MD. Schedule an appointment as soon as possible for a visit in 10 day(s).   Specialty:  Orthopedic Surgery Why:  follow up for pelvic fracture  Contact information: [address] [place] [address] [phone]        [place], Julie M, NP Follow up.   Specialty:  Nurse Practitioner Contact information: [address] [place] [address] [phone]            Contact information for after-discharge care    Destination    [facility] Follow up.   Specialty:  Skilled Nursing Chief of Staff information: [address] [place]  [place] [address] [phone]                 Allergies  Allergen Reactions  . Penicillins Rash    Consultations:  Orthopedics  Cardiology  Procedures/Studies: Ct Abdomen Pelvis Wo Contrast  Result Date: 03/22/2017 CLINICAL DATA:  Abdominal distention.  Pelvic fractures after fall. EXAM: CT ABDOMEN AND PELVIS WITHOUT CONTRAST TECHNIQUE: Multidetector CT imaging of the abdomen and pelvis was performed following the standard protocol without IV contrast. COMPARISON:  Pelvic CT 03/20/2017 FINDINGS: Lower chest: Cardiomegaly with coronary arteriosclerosis and aortic atherosclerosis. Small right pleural effusion with adjacent atelectasis. Trace left pleural effusion. Reflux of contrast is noted within the distal esophagus associated with a small hiatal hernia. Hepatobiliary: No space-occupying mass of the liver. No liver laceration is apparent. No biliary dilatation. Gallbladder is distended without wall thickening or  calculus possibly due to a fasting state. Pancreas: Normal appearance of the pancreas without inflammation or ductal dilatation. No space-occupying mass. Spleen: Normal spleen without mass. No evidence of splenic laceration or subcapsular fluid. Adrenals/Urinary Tract: Normal bilateral adrenal glands and kidneys. No obstructive uropathy. The urinary bladder is decompressed by Foley catheter and slightly deviated to the left secondary to the known extraperitoneal hematoma on the right. Stomach/Bowel: Moderate-to-marked gastric distention with enteric contrast. There is normal small bowel rotation. Contrast and fluid-filled distention of small bowel is identified with moderate colonic stool burden noted. No mechanical source obstruction is apparent. Findings may reflect an an ileus or possibly small bowel dysmotility. Vascular/Lymphatic: Slightly smaller right extraperitoneal hematoma now estimated at 9.9 x 7.2 cm on axial images acquired at the same level as prior. Previously this measured 12 x 8 cm. Moderate aortoiliac and branch vessel atherosclerosis. Reproductive: Status post hysterectomy.  Pelvic floor laxity. Other: No pneumoperitoneum or intraperitoneal fluid. Musculoskeletal: Fractures of the left obturator ring in the mid inferior pubic ramus at the puboacetabular junction, left sacral ale are fracture extending to the sacroiliac joint inferiorly, segmental fracturing of the right inferior pubic ramus and single fracture of the right superior pubic ramus are again noted without evidence of pelvic diastasis or proximal hip fractures. Left L5 transverse process fractures noted. There is lower lumbar degenerative disc and facet arthropathy with grade 1 anterolisthesis of L4 on L5. IMPRESSION: 1. Contrast and fluid distention of small bowel loops without mechanical source of obstruction identified. Findings likely represent an ileus or small bowel dysmotility. 2. Moderate-to-marked retention of enteric contrast  noted within the stomach with small hiatal hernia. This likely contributes to the contrast seen in the distal esophagus from reflux. 3. Chronic acute pelvic fractures as above. 4. Slightly smaller right pelvic sidewall extraperitoneal hematoma. Currently this measures 9.9 x 7.2 cm versus 12 x 8 cm on axial images. 5. Small right pleural effusion. 6. Cardiomegaly with coronary arteriosclerosis. Electronically Signed   By: Tollie Eth M.D.   On: 03/22/2017 16:55   Dg Chest 1 View  Result Date: 03/19/2017 CLINICAL DATA:  Pre-surgical imaging. History of diabetes and hypertension. EXAM: CHEST 1 VIEW COMPARISON:  04/20/2016 FINDINGS: Shallow inspiration with some elevation of the right hemidiaphragm. Probable atelectasis in the lung bases. No airspace disease or consolidation is suggested. Peribronchial thickening consistent with chronic bronchitis. No blunting of costophrenic angles. No pneumothorax. Calcified and tortuous aorta. Degenerative changes in the spine. IMPRESSION: Shallow inspiration with probable atelectasis in the lung bases. No evidence of active pulmonary disease. Aortic atherosclerosis. Electronically Signed   By: Burman Nieves M.D.   On: 03/19/2017 21:28  Dg Abd 1 View  Result Date: 03/27/2017 CLINICAL DATA:  Abdominal pain EXAM: ABDOMEN - 1 VIEW COMPARISON:  03/26/2017 FINDINGS: The dilute contrast medium in the colon. No visible dilated bowel. Gastric distention. Gas in indistinct loops of pelvic bowel. Fractures of the bilateral pubic rami are again observed. Patient also has a known left sacral fracture and left L5 transverse process fracture. IMPRESSION: 1. There is gas in indistinct loops of pelvic bowel which, if within small bowel, might be borderline dilated. No overtly dilated bowel observed. 2. Dilute contrast medium in the colon. 3. Pelvic fractures. Electronically Signed   By: Gaylyn Rong M.D.   On: 03/27/2017 12:12   Ct Head Wo Contrast  Result Date:  03/29/2017 CLINICAL DATA:  Slurred speech today. EXAM: CT HEAD WITHOUT CONTRAST TECHNIQUE: Contiguous axial images were obtained from the base of the skull through the vertex without intravenous contrast. COMPARISON:  Head CT scan 03/24/2017. FINDINGS: Brain: Cortical atrophy and chronic microvascular ischemic change are identified. No evidence of acute intracranial abnormality including hemorrhage, infarct, mass lesion, mass effect, midline shift or abnormal extra-axial fluid collection. No hydrocephalus or pneumocephalus. Vascular: Atherosclerosis noted. Skull: Intact. Sinuses/Orbits: Negative. Other: None. IMPRESSION: No acute abnormality. Atrophy and chronic microvascular ischemic change. Electronically Signed   By: Drusilla Kanner M.D.   On: 03/29/2017 16:30   Ct Head Wo Contrast  Result Date: 03/24/2017 CLINICAL DATA:  Altered level of consciousness. History of hypertension and diabetes. EXAM: CT HEAD WITHOUT CONTRAST TECHNIQUE: Contiguous axial images were obtained from the base of the skull through the vertex without intravenous contrast. COMPARISON:  None. FINDINGS: BRAIN: No intraparenchymal hemorrhage, mass effect nor midline shift. The ventricles and sulci are normal for age. Patchy supratentorial white matter hypodensities less than expected for patient's age, though non-specific are most compatible with chronic small vessel ischemic disease. No acute large vascular territory infarcts. No abnormal extra-axial fluid collections. Basal cisterns are patent. VASCULAR: Moderate calcific atherosclerosis of the carotid siphons. SKULL: No skull fracture. Severe temporomandibular osteoarthrosis. Subcentimeter lucent lesion LEFT C2 body, incompletely evaluated. No significant scalp soft tissue swelling. SINUSES/ORBITS: The mastoid air-cells and included paranasal sinuses are well-aerated.The included ocular globes and orbital contents are non-suspicious. Status post RIGHT ocular lens implant. OTHER: None.  IMPRESSION: 1. Negative noncontrast CT HEAD for age. 2. Partially imaged subcentimeter lucency C2 would be better characterized on dedicated CT if clinically indicated. Electronically Signed   By: Awilda Metro M.D.   On: 03/24/2017 18:59   Ct Pelvis Wo Contrast  Result Date: 03/20/2017 CLINICAL DATA:  Follow up pelvic fracture. Initial encounter. EXAM: CT PELVIS WITHOUT CONTRAST TECHNIQUE: Multidetector CT imaging of the pelvis was performed following the standard protocol without intravenous contrast. COMPARISON:  Yesterday FINDINGS: Urinary Tract: The bladder is displaced to the left and partially effaced. Bowel: Postoperative rectosigmoid junction for uncertain indication. No visible bowel injury. Vascular/Lymphatic: Bilateral pelvic hematoma, significantly increased on the right compared to prior, where there is tracking into the lower abdominal extraperitoneal space. Right pelvic sidewall hematoma measures up to 12 cm by 8 cm on axial slices. The left pelvic high-density areas noted on previous CT are likely lymph nodes given their stable density and shape. There is atherosclerotic calcification. 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 Reproductive:  Hysterectomy.  Pelvic floor laxity. Other:  No pneumoperitoneum or definite peritoneal fluid. Musculoskeletal: Left obturator ring fractures through the mid inferior pubic ramus at the puboacetabular junction. There is  left sacral ala fracture extending to the sacroiliac joint inferiorly; no displacement along the sacral foramina. Segmental fracturing of the right inferior pubic ramus and single fracture in the right superior pubic ramus. No pelvic diastasis. No proximal hip fracture or dislocation. L5 left transverse process fracture. Lower lumbar disc and facet degeneration with grade 1 L4-5 anterolisthesis. Critical Value/emergent results were called by telephone at the time of interpretation on  03/20/2017 at 4:51 pm to Dr. Jacquelin Hawking , who verbally acknowledged these results. IMPRESSION: 1. Large extraperitoneal pelvic hematoma, especially on the right, significantly increased from previous scan. Right-sided hematoma measures up to 12 x 8 cm. 2. Bilateral obturator ring, left L5 transverse process, and left sacral ala fractures as described. 3. Atherosclerosis. Intimal plaque distortion at the right common and external iliac suggests remote dissection. Electronically Signed   By: Marnee Spring M.D.   On: 03/20/2017 16:56   Ct Pelvis Wo Contrast  Result Date: 03/19/2017 CLINICAL DATA:  Fall with left hip pain. Pelvic fracture known or suspected. EXAM: CT PELVIS WITHOUT CONTRAST TECHNIQUE: Multidetector CT imaging of the pelvis was performed following the standard protocol without intravenous contrast. COMPARISON:  Left hip radiographs. FINDINGS: Urinary Tract: Urinary bladder is physiologically distended, no bladder wall thickening. Bowel: Enteric sutures in the sigmoid colon. Moderate colonic stool burden in the included colon. No evidence of bowel inflammation. Vascular/Lymphatic: Vascular calcifications without aneurysm. Reproductive:  Post hysterectomy.  No adnexal mass. Other: Pelvic hemorrhage related to pelvic fractures, greater on the left. On the left this causes mild associated mass-effect on the urinary bladder. Some hyperdense material in the region of the left pelvic hemorrhage can be seen with active bleeding. Musculoskeletal: Left pelvis: Displaced left sacral fracture, zone 1 extending into the sacroiliac joint. Fracture is lateral to the sacral foramen. Fracture extends superiorly to involve the left L5 transverse process. Superior pubic ramus fracture at the puboacetabular junction, comminuted and mildly displaced. Displaced comminuted left inferior pubic ramus fracture. Right pelvis: Comminuted superior and inferior pubic rami fractures. Fractures are minimally displaced. No definite  right sacral fracture. Pubic symphysis remains congruent.  Proximal femora are intact. IMPRESSION: 1. Displaced comminuted fractures of the left superior inferior pubic ramus, superior ramus fracture at the puboacetabular junction. Displaced left sacral fracture extending into the sacroiliac joint. Left L5 transverse process fracture. 2. Displaced right superior and inferior pubic rami fractures. No definite right sacral fracture. 3. Pelvic hemorrhage related to pelvic fractures, greater on the left. Questionable active bleeding within the left pelvic hemorrhage. These results were called by telephone at the time of interpretation on 03/19/2017 at 9:57 pm to [facility] , who verbally acknowledged these results. Electronically Signed   By: Rubye Oaks M.D.   On: 03/19/2017 21:58   Dg Pelvis Comp Min 3v  Result Date: 03/23/2017 CLINICAL DATA:  Pelvic fractures. EXAM: JUDET PELVIS - 3+ VIEW COMPARISON:  CT abdomen and pelvis from yesterday. FINDINGS: Again seen are mild inferiorly displaced fractures of the left superior and inferior pubic rami. Nondisplaced fractures of the right superior and inferior pubic rami. The pubic symphysis is normal. No pelvic diastasis. The sacrum is obscured by bowel gas. Degenerative changes of the bilateral hip joints. Osteopenia. IMPRESSION: 1. Unchanged mild inferiorly displaced fractures of the left superior and inferior pubic rami. 2. Unchanged nondisplaced fractures of the right superior and inferior pubic rami. Electronically Signed   By: Obie Dredge M.D.   On: 03/23/2017 13:15   Dg Pelvis Comp Min 3v  Result Date: 03/21/2017 CLINICAL DATA:  Fall, pelvic fracture EXAM: JUDET PELVIS - 3+ VIEW COMPARISON:  CT pelvis dated 03/20/2017 FINDINGS: Bilateral pelvic ring fractures, better evaluated on CT. Known left sacral fracture is not well visualized due to overlying bowel gas. IMPRESSION: Bilateral pelvic ring fractures, better evaluated on CT. Known left sacral  fracture is not well visualized due to overlying bowel gas. Electronically Signed   By: Charline Bills M.D.   On: 03/21/2017 09:49   Nm Pulmonary Perf And Vent  Result Date: 03/23/2017 CLINICAL DATA:  Multiple pelvic fractures status post mechanical fall complicated by pelvic hematoma. EXAM: NUCLEAR MEDICINE VENTILATION - PERFUSION LUNG SCAN TECHNIQUE: Ventilation images were obtained in multiple projections using inhaled aerosol Tc-41m DTPA. Perfusion images were obtained in multiple projections after intravenous injection of Tc-37m MAA. RADIOPHARMACEUTICALS:  30.4 mCi Technetium-88m DTPA aerosol inhalation and 4.36 mCi Technetium-31m MAA IV COMPARISON:  CXR from earlier on the same day FINDINGS: Ventilation: Central air trapping of inhaled aerosolized material. Perfusion: No wedge shaped peripheral perfusion defects to suggest acute pulmonary embolism. IMPRESSION: No moderate or large perfusion-ventilation mismatches. Findings in keeping with very low probability for pulmonary embolus. Electronically Signed   By: Tollie Eth M.D.   On: 03/23/2017 21:42   Dg Chest Port 1 View  Result Date: 03/26/2017 CLINICAL DATA:  Congestive heart failure.  Diabetes. EXAM: PORTABLE CHEST 1 VIEW COMPARISON:  03/24/2017 FINDINGS: Right-sided central line tip: Cavoatrial junction. This is improved compared to the 03/24/2017 exam. Atherosclerotic calcification of the aortic arch. Mild enlargement of the cardiopericardial silhouette. Cephalization of blood flow. Indistinct bandlike opacity at the right lung base favoring atelectasis. IMPRESSION: 1. Mild enlargement of the cardiopericardial silhouette with cephalization of blood flow suggesting pulmonary venous hypertension. 2. Bandlike opacity at the right lung base favoring atelectasis. 3. Right PICC line tip is at the cavoatrial junction. Electronically Signed   By: Gaylyn Rong M.D.   On: 03/26/2017 08:36   Dg Chest Port 1 View  Result Date: 03/24/2017 CLINICAL  DATA:  Confirm line placement, Modus II research study. Head 45 degrees and arm 30 to 35 degrees. EXAM: PORTABLE CHEST 1 VIEW COMPARISON:  03/24/2017 FINDINGS: Patient has right-sided PICC line catheter, tip overlying the level of the right atrium. Consider withdrawing catheter approximately 2 cm into the lower superior vena cava. No pneumothorax. Shallow lung inflation. Calibration ring overlies the left axillary region. IMPRESSION: Right-sided PICC line tip overlying the level of the right atrium. Electronically Signed   By: Norva Pavlov M.D.   On: 03/24/2017 15:46   Dg Chest Port 1 View  Result Date: 03/24/2017 CLINICAL DATA:  Wheezing today EXAM: PORTABLE CHEST 1 VIEW COMPARISON:  Portable chest x-ray of 03/23/2017 FINDINGS: Mild bibasilar atelectasis remains with suboptimal inspiration. No pneumonia or effusion is seen. Mediastinal and hilar contours are unchanged and mild cardiomegaly stable. Moderate thoracic aortic atherosclerosis is noted. IMPRESSION: 1. Suboptimal inspiration with mild bibasilar atelectasis. 2. Stable mild cardiomegaly. Electronically Signed   By: Dwyane Dee M.D.   On: 03/24/2017 11:57   Dg Chest Port 1 View  Result Date: 03/23/2017 CLINICAL DATA:  Shortness of breath. EXAM: PORTABLE CHEST 1 VIEW COMPARISON:  March 22, 2017. FINDINGS: Stable cardiomegaly. Normal pulmonary vascularity. Persistent low lung volumes and bibasilar atelectasis. No pleural effusion or pneumothorax. IMPRESSION: Stable cardiomegaly and low lung volumes with bibasilar atelectasis. Electronically Signed   By: Obie Dredge M.D.   On: 03/23/2017 13:10   Dg Chest Port 1 View  Result Date:  03/22/2017 CLINICAL DATA:  Decreased breath sounds. History of diabetes, CHF, atrial fibrillation, hypertension, recent fall with multiple pelvic fractures EXAM: PORTABLE CHEST 1 VIEW COMPARISON:  Portable chest x-ray of March 19, 2017 FINDINGS: The right lung remains mildly hypoinflated. The left lung is  better inflated. The cardiac silhouette is enlarged. The pulmonary vascularity is normal. There is no definite pleural effusion. There is calcification in the wall of the aortic arch. IMPRESSION: Persistent mild hypoinflation on the right. Stable cardiomegaly. No definite pneumonia nor CHF. Electronically Signed   By: David  [place] M.D.   On: 03/22/2017 08:42   Dg Abd Portable 1v  Result Date: 03/26/2017 CLINICAL DATA:  Generalized abdominal pain. EXAM: PORTABLE ABDOMEN - 1 VIEW COMPARISON:  One day prior FINDINGS: Supine views of the abdomen and pelvis. There is persistent moderate gastric distension. Gaseous distension of small bowel loops is improved, mild. Gas and stool identified within normal caliber colon. Osteopenia. Extensive pelvic fractures. IMPRESSION: Improved appearance of small bowel dilatation, favoring resolving adynamic ileus or low-grade partial small bowel obstruction. Persistent moderate gastric distention. Consider nasogastric tube placement. Electronically Signed   By: Jeronimo Greaves M.D.   On: 03/26/2017 08:25   Dg Abd Portable 1v  Result Date: 03/25/2017 CLINICAL DATA:  Ileus, history diabetes mellitus, hypertension EXAM: PORTABLE ABDOMEN - 1 VIEW COMPARISON:  Portable exam 0633 hours compared to 03/24/2017 FINDINGS: Gas distention of stomach. Diffuse gaseous distention of small bowel loops throughout abdomen. Scattered gas and stool in colon. Stool in rectum. Findings are most consistent with ileus, unchanged. Bone severely demineralized with again identified LEFT superior pubic ramus fracture. IMPRESSION: Persistent ileus with increased gaseous distention of the stomach. Electronically Signed   By: Ulyses Southward M.D.   On: 03/25/2017 07:22   Dg Abd Portable 1v  Result Date: 03/24/2017 CLINICAL DATA:  Ileus EXAM: PORTABLE ABDOMEN - 1 VIEW COMPARISON:  03/23/2017 FINDINGS: Gaseous distention of large and small bowel appears unchanged. Minimal air in the rectum. Bilateral pubic rami  fractures noted. IMPRESSION: Adynamic ileus unchanged. Electronically Signed   By: Marlan Palau M.D.   On: 03/24/2017 07:37   Dg Abd Portable 1v  Result Date: 03/23/2017 CLINICAL DATA:  Abdominal pain. EXAM: PORTABLE ABDOMEN - 1 VIEW COMPARISON:  CT abdomen pelvis from yesterday. FINDINGS: Multiple mildly dilated loops of air-filled small bowel are again seen. Stool is noted in the right colon. Bilateral pubic rami fractures. IMPRESSION: Findings suggestive of ileus. Electronically Signed   By: Obie Dredge M.D.   On: 03/23/2017 13:17   Dg Abd Portable 1v  Result Date: 03/22/2017 CLINICAL DATA:  Clinical constipation EXAM: PORTABLE ABDOMEN - 1 VIEW COMPARISON:  AP pelvis radiographs and pelvic CT scan of September 9th 2018 FINDINGS: There are loops of moderately distended gas-filled small and large bowel present there is a moderate amount of stool in the right colon and some within the descending colon. There is no definite rectal gas. No free extraluminal gas collections are observed. Gas in the left upper quadrant likely lies within the stomach but distention of the splenic flexure region could be present. Bilateral pelvic ring displaced fractures are again demonstrated. IMPRESSION: Gaseous distention of small and large bowel loops may reflect ileus or possible distal colonic obstructive process though I favor the former. No definite perforation. Abdominal and pelvic CT scanning would be a useful next imaging step. Electronically Signed   By: David  [place] M.D.   On: 03/22/2017 08:40   Dg Hip Unilat With Pelvis 2-3 Views  Left  Result Date: 03/19/2017 CLINICAL DATA:  Larey Seat down the, left hip pain EXAM: DG HIP (WITH OR WITHOUT PELVIS) 2-3V LEFT COMPARISON:  None. FINDINGS: Pubic symphysis is intact. There are displaced right superior and inferior pubic rami fractures. Possible vertically-oriented fracture through the right aspect of the sacrum. Superiorly displaced left inferior and superior pubic  rami fractures with additional fracture suspected through the left sacrum. There is probably slight widening of the left SI joint. Both femoral heads project in joint. Vascular calcifications. Postsurgical suture in the pelvis IMPRESSION: 1. Superiorly displaced left superior and inferior pubic rami with probable vertically-oriented fracture through the left sacrum and suspected mild widening of left SI joint, suspect for vertical shear injury. 2. Displaced fractures involving the right superior and inferior pubic rami with questionable fracture lucency in the right aspect of sacrum. Critical Value/emergent results were called by telephone at the time of interpretation on 03/19/2017 at 9:00 pm to Dr. Marily Memos , who verbally acknowledged these results. Electronically Signed   By: Jasmine Pang M.D.   On: 03/19/2017 21:00   Subjective: Feels weak, didn't sleep well with transfer yesterday. No chest pain, dyspnea, palpitations. Pain is minimal, controlled with tylenol. Stool output decreased.   Discharge Exam: Vitals:   03/31/17 0150 03/31/17 0435  BP: 112/61 111/65  Pulse: 86 74  Resp: 18 18  Temp: 98.4 F (36.9 C) 98.5 F (36.9 C)  SpO2: 94% 100%   General: Pt is alert, awake, not in acute distress Cardiovascular: RRR, S1/S2 +, no rubs, no gallops Respiratory: CTA bilaterally, no wheezing, no rhonchi Abdominal: Soft, NT, ND, bowel sounds + Extremities: No edema, no cyanosis. Pain with hip ROM but no deformities.  Labs: BNP (last 3 results)  Recent Labs  03/24/17 1148  BNP 373.3*   Basic Metabolic Panel:  Recent Labs Lab 03/27/17 0602 03/27/17 1600 03/28/17 1000 03/29/17 0435 03/30/17 0421  NA 136 135 136 136 138  K 3.6 3.9 3.0* 4.1 4.1  CL 102 100* 104 105 104  CO2 GLUCOSE 176* 140* 177* 109* 156*  BUN 25* 30* 28* 26* 22*  CREATININE 0.70 0.80 0.69 0.67 0.64  CALCIUM 7.9* 8.3* 7.5* 7.8*  7.7* 7.9*  MG  --  2.0  --  2.1  --    Liver Function  Tests:  Recent Labs Lab 03/25/17 0410  AST 21  ALT 23  ALKPHOS 53  BILITOT 1.7*  PROT 5.7*  ALBUMIN 2.7*   No results for input(s): LIPASE, AMYLASE in the last 168 hours. No results for input(s): AMMONIA in the last 168 hours. CBC:  Recent Labs Lab 03/25/17 0410 03/26/17 0426 03/27/17 0602 03/28/17 1000 03/29/17 1525 03/30/17 0421  WBC 10.4 18.4* 14.0* 11.6* 16.1* 17.1*  NEUTROABS 9.4* 16.5* 11.9* 9.6*  --   --   HGB 7.4* 7.4* 7.7* 8.1* 9.0* 9.4*  HCT 23.2* 23.0* 24.1* 25.4* 28.8* 30.3*  MCV 93.5 93.9 94.9 91.4 93.5 93.5  PLT 310 410* 454* 387 482* 492*   Cardiac Enzymes: No results for input(s): CKTOTAL, CKMB, CKMBINDEX, TROPONINI in the last 168 hours. BNP: Invalid input(s): POCBNP CBG:  Recent Labs Lab 03/30/17 1237 03/30/17 1634 03/30/17 1922 03/30/17 2345 03/31/17 0750  GLUCAP 219* 183* 168* 165* 122*   Urinalysis    Component Value Date/Time   COLORURINE YELLOW 03/19/2017 2337   APPEARANCEUR HAZY (A) 03/19/2017 2337   LABSPEC 1.018 03/19/2017 2337   PHURINE 5.0 03/19/2017 2337   GLUCOSEU NEGATIVE  03/19/2017 2337   HGBUR NEGATIVE 03/19/2017 2337   BILIRUBINUR NEGATIVE 03/19/2017 2337   KETONESUR 5 (A) 03/19/2017 2337   PROTEINUR NEGATIVE 03/19/2017 2337   UROBILINOGEN 0.2 03/19/2015 1945   NITRITE POSITIVE (A) 03/19/2017 2337   LEUKOCYTESUR MODERATE (A) 03/19/2017 2337    Microbiology Recent Results (from the past 240 hour(s))  Culture, Urine     Status: Abnormal   Collection Time: 03/24/17 11:29 AM  Result Value Ref Range Status   Specimen Description URINE, CATHETERIZED  Final   Special Requests NONE  Final   Culture >=100,000 COLONIES/mL ESCHERICHIA COLI (A)  Final   Report Status 03/26/2017 FINAL  Final   Organism ID, Bacteria ESCHERICHIA COLI (A)  Final      Susceptibility   Escherichia coli - MIC*    AMPICILLIN 4 SENSITIVE Sensitive     CEFAZOLIN <=4 SENSITIVE Sensitive     CEFTRIAXONE <=1 SENSITIVE Sensitive     CIPROFLOXACIN  <=0.25 SENSITIVE Sensitive     GENTAMICIN <=1 SENSITIVE Sensitive     IMIPENEM <=0.25 SENSITIVE Sensitive     NITROFURANTOIN <=16 SENSITIVE Sensitive     TRIMETH/SULFA <=20 SENSITIVE Sensitive     AMPICILLIN/SULBACTAM <=2 SENSITIVE Sensitive     PIP/TAZO <=4 SENSITIVE Sensitive     Extended ESBL NEGATIVE Sensitive     * >=100,000 COLONIES/mL ESCHERICHIA COLI    Time coordinating discharge: Approximately 40 minutes  Hazeline Junker, MD  [facility] 03/31/2017, 9:33 AM Pager [phone]

## 2017-03-31 NOTE — Clinical Social Work Placement (Signed)
   CLINICAL SOCIAL WORK PLACEMENT  NOTE  Date:  03/31/2017  Patient Details  Name: Tracey Porter MRN: [id number] Date of Birth: 11-Dec-1929  Clinical Social Work is seeking post-discharge placement for this patient at the Skilled  Nursing Facility level of care (*CSW will initial, date and re-position this form in  chart as items are completed):  Yes   Patient/family provided with [facility] Clinical Social Work Department's list of facilities offering this level of care within the geographic area requested by the patient (or if unable, by the patient's family).  Yes   Patient/family informed of their freedom to choose among providers that offer the needed level of care, that participate in Medicare, Medicaid or managed care program needed by the patient, have an available bed and are willing to accept the patient.  Yes   Patient/family informed of [facility]'s ownership interest in [facility] and [facility], as well as of the fact that they are under no obligation to receive care at these facilities.  PASRR submitted to EDS on 03/22/17     PASRR number received on 03/22/17     Existing PASRR number confirmed on       FL2 transmitted to all facilities in geographic area requested by pt/family on 03/22/17     FL2 transmitted to all facilities within larger geographic area on       Patient informed that his/her managed care company has contracts with or will negotiate with certain facilities, including the following:        Yes   Patient/family informed of bed offers received.  Patient chooses bed at [facility]     Physician recommends and patient chooses bed at      Patient to be transferred to [facility] on 03/31/17.  Patient to be transferred to facility by [facility]     Patient family notified on 03/31/17 of transfer.  Name of family member notified:  Melba Coon     PHYSICIAN Please prepare prescriptions     Additional Comment:     _______________________________________________ Margarito Liner, LCSW 03/31/2017, 11:42 AM

## 2017-03-31 NOTE — Clinical Social Work Note (Signed)
CSW facilitated patient discharge including contacting patient family and facility to confirm patient discharge plans. Clinical information faxed to facility and family agreeable with plan. CSW arranged ambulance transport via [facility] to [facility] at 12:00 pm. RN to call report prior to discharge [phone] Room [phone]).  CSW will sign off for now as social work intervention is no longer needed. Please consult [place] again if new needs arise.  Charlynn Court, CSW [phone]

## 2017-03-31 NOTE — Progress Notes (Signed)
Report given on patient to be transferred to 3E14. Patient will be on telemetry. Patient transferred via bed with belongings. Belongings included cell phone, clothing, and flowers.   Noe Gens, RN

## 2017-03-31 NOTE — Progress Notes (Signed)
Report given to Florentina Addison, RN at [facility]

## 2017-04-04 ENCOUNTER — Telehealth: Payer: Self-pay | Admitting: Cardiovascular Disease

## 2017-04-04 NOTE — Telephone Encounter (Signed)
NEw message  Pt daughter call requesting to speak with Dr Tresa Endo. Pt daughter would like to discuss pts health and ask to speak with Dr. Tresa Endo. Please call back to discuss

## 2017-04-04 NOTE — Telephone Encounter (Signed)
Awaiting dated with reference to heart rate and blood pressure

## 2017-04-04 NOTE — Telephone Encounter (Signed)
Follow up    Daughter calling back to confirm if fax received with blood pressure readings. Please call

## 2017-04-04 NOTE — Telephone Encounter (Signed)
Returned the call to the patient's daughter, per the dpr. She stated that her mother recently had a fall and is now in inpatient rehab at [facility]. She is concerned about the medication  that had been added and changed to control her mother's afib and heart rate. She stated that her mother is now tired all the time and her heart rate has dropped. She would like for Dr. Tresa Endo to be aware (for his information). She will have the medication list faxed to the office for an update.

## 2017-04-04 NOTE — Telephone Encounter (Signed)
The patient's daughter will have the blood pressure and heart rate recordings faxed over with the medication list.

## 2017-04-04 NOTE — Telephone Encounter (Signed)
Left a message for the daughter informing her that the office had not received the medication list or blood pressure log as of yet.

## 2017-04-05 ENCOUNTER — Telehealth: Payer: Self-pay | Admitting: Cardiovascular Disease

## 2017-04-05 NOTE — Telephone Encounter (Signed)
See telephone note 9/25

## 2017-04-05 NOTE — Telephone Encounter (Signed)
New Message    They faxed paperwork over to Dr Tresa Endo and need assessment of her medication she is really tired, suppose to be doing physical therapy and she is too weak

## 2017-04-05 NOTE — Telephone Encounter (Signed)
Records given to medical records to hold until appointment with Dr.Kelly.

## 2017-04-05 NOTE — Telephone Encounter (Signed)
Spoke to [employer] with [facility].Azalee Course PA reviewed records faxed today.He advised patient needs to decrease Cardizem to 120 mg daily.Advsied to monitor pulse,if resting pulse greater than 100 call office.Advised to keep appointment as planned with [facility] 04/28/17 at 9:40 am.

## 2017-04-11 ENCOUNTER — Telehealth: Payer: Self-pay | Admitting: Cardiovascular Disease

## 2017-04-11 NOTE — Telephone Encounter (Signed)
S/w Victorino Dike ( daughter) [facility] sent pt to [facility] for blood transfusion and they stopped her Eliquis for 2 weeks and just wanted to let us know this is due to bleeding when they did a rectal scope. INR was <0.8

## 2017-04-11 NOTE — Telephone Encounter (Signed)
Victorino Dike ( daughter) is calling because she was taken off of Eliquis because she was having a GI bleed and she at [facility] . Please call If you have any questions . Thanks

## 2017-04-12 NOTE — Telephone Encounter (Signed)
acknowledged

## 2017-04-15 ENCOUNTER — Telehealth: Payer: Self-pay | Admitting: Cardiovascular Disease

## 2017-04-15 NOTE — Telephone Encounter (Signed)
Spoke with the daughter. She wanted to give an update on her mother to Dr. Tresa Endo. She stated that the patient currently has pneumonia and is still having problems with edema. [facility] has increased her lasix to 20 mg bid. Will route to the provider and his nurse for their knowledge.

## 2017-04-15 NOTE — Telephone Encounter (Signed)
New message    Pt daughter is calling for pt. She wants to make sure Dr. Tresa Endo knows that pt was in the hospital and is still in [place]. She would like to talk to nurse about pt medications.

## 2017-04-19 NOTE — Telephone Encounter (Signed)
Thanks for letting me know!

## 2017-04-21 ENCOUNTER — Telehealth: Payer: Self-pay | Admitting: Cardiovascular Disease

## 2017-04-21 NOTE — Telephone Encounter (Signed)
Received records from [place] at [facility] for appointment on 04/28/17 with Dr Tresa Endo.  Records put with Dr Landry Dyke schedule for 04/28/17. lp

## 2017-04-28 ENCOUNTER — Encounter: Payer: Self-pay | Admitting: Cardiovascular Disease

## 2017-04-28 ENCOUNTER — Ambulatory Visit (INDEPENDENT_AMBULATORY_CARE_PROVIDER_SITE_OTHER): Payer: Medicare Other | Admitting: Cardiovascular Disease

## 2017-04-28 VITALS — BP 124/77 | HR 106 | Ht 65.5 in | Wt 204.4 lb

## 2017-04-28 DIAGNOSIS — I4892 Unspecified atrial flutter: Secondary | ICD-10-CM | POA: Diagnosis not present

## 2017-04-28 DIAGNOSIS — Z79899 Other long term (current) drug therapy: Secondary | ICD-10-CM

## 2017-04-28 DIAGNOSIS — I10 Essential (primary) hypertension: Secondary | ICD-10-CM

## 2017-04-28 DIAGNOSIS — Z7901 Long term (current) use of anticoagulants: Secondary | ICD-10-CM | POA: Diagnosis not present

## 2017-04-28 DIAGNOSIS — J810 Acute pulmonary edema: Secondary | ICD-10-CM

## 2017-04-28 DIAGNOSIS — E785 Hyperlipidemia, unspecified: Secondary | ICD-10-CM

## 2017-04-28 DIAGNOSIS — E119 Type 2 diabetes mellitus without complications: Secondary | ICD-10-CM

## 2017-04-28 MED ORDER — AMIODARONE HCL 200 MG PO TABS: 200.0000 mg | ORAL_TABLET | Freq: Two times a day (BID) | ORAL | 3 refills | Status: DC

## 2017-04-28 MED ORDER — TORSEMIDE 20 MG PO TABS: 20.0000 mg | ORAL_TABLET | Freq: Every day | ORAL | 3 refills | Status: DC

## 2017-04-28 NOTE — Patient Instructions (Signed)
Medication Instructions:  INCREASE amiodarone to 200 mg two times daily  STOP furosemide  START torsemide 20 mg daily  Labwork: Please return for labs in 2-3 weeks (CMET, CBC, Mag, TSH)  Our in office lab hours are Monday-Friday 8:00-4:30, closed for lunch 1-2 pm.  No appointment needed.  Testing/Procedures: Your physician has requested that you have an echocardiogram. Echocardiography is a painless test that uses sound waves to create images of your heart. It provides your doctor with information about the size and shape of your heart and how well your heart's chambers and valves are working. This procedure takes approximately one hour. There are no restrictions for this procedure. [facility] location:  [employer]   Follow-Up: Your physician recommends that you schedule a follow-up appointment in: 4 weeks with Dr. Tresa EndoKelly.    Any Other Special Instructions Will Be Listed Below (If Applicable).     If you need a refill on your cardiac medications before your next appointment, please call your pharmacy.

## 2017-04-28 NOTE — Progress Notes (Signed)
Cardiology Office Note    Date:  05/04/2017   ID:  Tracey Porter, DOB 1930-03-01, MRN [id number]  PCP:  [place], Julie M, NP  Cardiologist:  Shelva Majestic, MD     History of Present Illness:  Tracey Porter is a 83 y.o. female who was initially referred for cardiology evaluation following an emergency room visit where she was found to be in SVT. She presents now for a 6 month follow-up cardiology evaluation.  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  She has a history of hypertension, hyperlipidemia, and type 2 diabetes mellitus.  She had been taking metformin 500 mg daily with breakfast, leg, basal, alt to her milligrams twice a day, HCTZ 12.5 g daily, and atorvastatin 40 mg.  She has had issues with urinary urgency and was put on Mirabegron.  On 04/20/2016.  She also he went to [facility] with initial urinary complaints and was found to have SVT.  She was not seen by cardiologist.  However, Dr. Debara Pickett had spoken to the ER physician and it was recommended the addition of diltiazem LA 120 mg daily.  Patient was asymptomatic with reference to this tachycardia and a subsequent ECG showed restoration of normal sinus rhythm.  I further titrated diltiazem from 120 mg to 180 mg and recommended discontinuance of hydrochlorothiazide.  She underwent a 2-D echo Doppler study on 05/19/2016 which showed an EF of 65-70%.  She had normal wall motion.  There was grade 2 diastolic dysfunction.  There was mitral annular calcification with mild to moderate MR.  There was mild aortic valve stenosis with a peak gradient of 20 and mean gradient of 12 mmHg.  She had severe LA dilatation, moderate TR, and mild/moderate pulmonary hypertension with PA pressure 44 mm.  When I saw her in December 2017, she was hypertensive and I further titrated Cardizem CD to 240 mg daily.  She has continued to take labetalol 200 mg twice a day.   Does continue to take atorvastatin 40 mg for hyperlipidemia and is on metformin for type 2 diabetes mellitus.  On 10/19/2016 repeat laboratory revealed a total cholesterol of 191, triglycerides 83, HDL 88, and LDL cholesterol 86.    Since I last saw her in April 2018.  She has had multiple subsequent evaluations in July 2018.  She was admitted to [facility] with acute onset of hypertensive pulmonary edema.  She responded to diuresis and blood pressure control.  An echo showed an EF of 45-50% with mild anteroseptal wall motion amount, moderate MR, mild, moderate TR, and estimated RV systolic pressure at 48 mm.  There was minimal troponin elevation not diagnostic of MI.  Really beta wall was further uptitrated and furosemide was added.  She was seen in the office on 02/02/2017 by Almyra Deforest, PA and was improved.  With her echo Doppler study showing reduced function compared to a prior study of November 2017 which showed an EF of 65-70% a Lexiscan Myoview study was recommended.  Lexiscan study was performed on 02/22/2017 and was low risk with an EF of 60% without ischemia.  Of note, the patient was in atrial fibrillation during the study.  As result, she was seen on 02/26/2017.  I Tracey Porter for follow-up.  Eliquis 5 mg twice a day was initiated.  Her Cardizem CD dose was increased at 3-60 mg daily and she continued to be on labetalol 300  mg twice a day.  Apparently, she was hospitalized at  following a fall down the steps where she fractured her pelvis.  She did not have hip surgery.  She was taken off eliquis due to blood in her stool.  Alternately, she was sent to [facility] and later underwent rehabilitation at [facility]  rehabilitation facility in [facility].    Presently, she is now back on apixaban, which was started on 04/26/2017.  She continues to be on amiodarone 20 mg daily, diltiazem 120 Milligan grams, furosemide 20 Milligan grams, labetalol 300 mg twice a day,  potassium 10 mEq, in addition to her metformin and prednisolone.  She denies chest pain.  She presents for evaluation.  Past Medical History:  Diagnosis Date  . Diabetes mellitus without complication ([place])   . Hypertension     Past Surgical History:  Procedure Laterality Date  . ABDOMINAL HYSTERECTOMY  1976  . EYE SURGERY      Current Medications: Outpatient Medications Prior to Visit  Medication Sig Dispense Refill  . acyclovir (ZOVIRAX) 400 MG tablet Take 800 mg by mouth 2 (two) times daily.    [name] alendronate (FOSAMAX) 70 MG tablet Take 70 mg by mouth once a week.    [name] apixaban (ELIQUIS) 5 MG TABS tablet Take 1 tablet (5 mg total) by mouth 2 (two) times daily. Please cancel previous rx's sent; call pt with price before filling 180 tablet 3  . atorvastatin (LIPITOR) 40 MG tablet Take 1 tablet (40 mg total) by mouth daily. 90 tablet 1  . diltiazem (CARDIZEM CD) 120 MG 24 hr capsule Take 1 capsule (120 mg total) by mouth daily. 90 capsule 3  . ibuprofen (ADVIL,MOTRIN) 600 MG tablet Take 1 tablet (600 mg total) by mouth every 6 (six) hours as needed. 30 tablet 0  . labetalol (NORMODYNE) 300 MG tablet Take 1 tablet (300 mg total) by mouth 2 (two) times daily. Please cancel previous rx's sent 180 tablet 3  . loperamide (IMODIUM) 2 MG capsule Take 1 capsule (2 mg total) by mouth as needed for diarrhea or loose stools. 30 capsule 0  . metFORMIN (GLUCOPHAGE-XR) 500 MG 24 hr tablet Take 500 mg by mouth every morning.    [name] MYRBETRIQ 25 MG TB24 tablet Take 25 mg by mouth daily.    . nitroGLYCERIN (NITROSTAT) 0.4 MG SL tablet Place 1 tablet (0.4 mg total) under the tongue every 5 (five) minutes as needed for chest pain. 25 tablet 3  . potassium chloride (K-DUR) 10 MEQ tablet Take 1 tablet (10 mEq total) by mouth daily. 90 tablet 1  . prednisoLONE acetate (PRED FORTE) 1 % ophthalmic suspension Place 1 drop into the right eye 4 (four) times daily.     [name] amiodarone (PACERONE) 200 MG tablet Take 1  tablet (200 mg total) by mouth daily.    . furosemide (LASIX) 20 MG tablet Take 1 tablet (20 mg total) by mouth daily. 90 tablet 3   No facility-administered medications prior to visit.      Allergies:   Penicillins   Social History   Social History  . Marital status: Widowed    Spouse name: N/A  . Number of children: N/A  . Years of education: N/A   Social History Main Topics  . Smoking status: Former Research scientist (life sciences)  . Smokeless tobacco: Never Used  . Alcohol use Yes  . Drug use: No  . Sexual activity: Not Asked   Other Topics Concern  . None  Social History Narrative  . None    Additional social history is notable that she is from [facility] in [place].  She is widowed for 19 years.  She is 4 children and 6 grandchildren.  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  She had remotely smoked cigarettes for 10 years but quit in 1970.  She usually has one drink per day of either wine or scotch.  She purchase patent water aerobics at least 3-4 days per week for an hour at a time.  Family History:  Her family history is notable in that her mother died at age 66 with liver problems and father died with lung cancer at 67.  She has one sister in her 82s.  Maternal grand mother died with a stroke.  Maternal grandfather died with typhoid fever.  She had 4 children, but unfortunately one died at age 79 in an accident.  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; No changes in hearing, sinus congestion, difficulty swallowing Pulmonary: Negative; No cough, wheezing, shortness of breath, hemoptysis Cardiovascular: See history of present illness GI: Negative; No nausea, vomiting, diarrhea, or abdominal pain GU: Positive for urinary frequency Musculoskeletal: Status Porter hip fracture, not operative Hematologic/Oncology: Negative; no easy bruising,  bleeding Endocrine: Positive for diabetes mellitus Neuro: Negative; no changes in balance, headaches Skin: Negative; No rashes or skin lesions Psychiatric: Negative; No behavioral problems, depression Sleep: Negative; No snoring, daytime sleepiness, hypersomnolence, bruxism, restless legs, hypnogognic hallucinations, no cataplexy Other comprehensive 14 point system review is negative.   PHYSICAL EXAM:   VS:  BP 124/77   Pulse (!) 106   Ht 5' 5.5" (1.664 m)   Wt 204 lb 6.4 oz (92.7 kg)   BMI 33.50 kg/m     Wt Readings from Last 3 Encounters:  04/28/17 204 lb 6.4 oz (92.7 kg)  03/31/17 207 lb (93.9 kg)  02/24/17 176 lb 6.4 oz (80 kg)    General: Alert, oriented, no distress.  Skin: normal turgor, no rashes, warm and dry HEENT: Normocephalic, atraumatic. Pupils equal round and reactive to light; sclera anicteric; extraocular muscles intact;  Nose without nasal septal hypertrophy Mouth/Parynx benign; Mallinpatti scale 2 Neck: No JVD, no carotid bruits; normal carotid upstroke Lungs: clear to ausculatation and percussion; no wheezing or rales Chest wall: without tenderness to palpitation Heart: PMI not displaced, RRR, s1 s2 normal, 1/6 systolic murmur, no diastolic murmur, no rubs, gallops, thrills, or heaves Abdomen: soft, nontender; no hepatosplenomehaly, BS+; abdominal aorta nontender and not dilated by palpation. Back: no CVA tenderness Pulses 2+ Musculoskeletal: full range of motion, normal strength, no joint deformities Extremities: 1-2+ pretibial edema.  no clubbing cyanosis, Homan's sign negative  Neurologic: grossly nonfocal; Cranial nerves grossly wnl Psychologic: Normal mood and affect   Studies/Labs Reviewed:   ECG (independently read by me): Atrial flutter with variable block, ventricular rate at 106.  Specific T wave abnormality.  QTc 460 ms.  April 2018 ECG (independently read by me): Sinus rhythm at 87 bpm.  Normal intervals.  No significant ST  changes.  06/25/2016 ECG (independently read by me): Normal sinus rhythm at 92 bpm.  PR interval 190 ms.  QTc interval 427 ms.  04/30/2016 ECG (independently read by me): Normal sinus rhythm at 75 bpm.  I personally reviewed the 2 ECGs from her emergency evaluation on 04/20/2016.   Initial ECG showed SVT  at 148 with a PR interval at 107 ms and QTc interval at 426 ms.  Subsequent ECG showed sinus tachycardia 109 bpm with a PR interval at 187 ms and QTc interval 419 ms.  Recent Labs: BMP Latest Ref Rng & Units 03/30/2017 03/29/2017 03/29/2017  Glucose 65 - 99 mg/dL 156(H) 109(H) -  BUN 6 - 20 mg/dL 22(H) 26(H) -  Creatinine 0.44 - 1.00 mg/dL 0.64 0.67 -  BUN/Creat Ratio 12 - 28 - - -  Sodium 135 - 145 mmol/L 138 136 -  Potassium 3.5 - 5.1 mmol/L 4.1 4.1 -  Chloride 101 - 111 mmol/L 104 105 -  CO2 22 - 32 mmol/L 31 28 -  Calcium 8.9 - 10.3 mg/dL 7.9(L) 7.8(L) 7.7(L)     Hepatic Function Latest Ref Rng & Units 03/25/2017 03/24/2017 03/20/2017  Total Protein 6.5 - 8.1 g/dL 5.7(L) 5.8(L) 6.6  Albumin 3.5 - 5.0 g/dL 2.7(L) 2.9(L) 3.5  AST 15 - 41 U/L '21 26 24  ' ALT 14 - 54 U/L '23 22 22  ' Alk Phosphatase 38 - 126 U/L 53 53 61  Total Bilirubin 0.3 - 1.2 mg/dL 1.7(H) 1.4(H) 1.0  Bilirubin, Direct 0.1 - 0.5 mg/dL - - -    CBC Latest Ref Rng & Units 03/30/2017 03/29/2017 03/28/2017  WBC 4.0 - 10.5 K/uL 17.1(H) 16.1(H) 11.6(H)  Hemoglobin 12.0 - 15.0 g/dL 9.4(L) 9.0(L) 8.1(L)  Hematocrit 36.0 - 46.0 % 30.3(L) 28.8(L) 25.4(L)  Platelets 150 - 400 K/uL 492(H) 482(H) 387   Lab Results  Component Value Date   MCV 93.5 03/30/2017   MCV 93.5 03/29/2017   MCV 91.4 03/28/2017   Lab Results  Component Value Date   TSH 0.351 03/25/2017   Lab Results  Component Value Date   HGBA1C 6.2 (H) 03/20/2017     BNP    Component Value Date/Time   BNP 373.3 (H) 03/24/2017 1148    ProBNP No results found for: PROBNP   Lipid Panel  No results found for: CHOL, TRIG, HDL, CHOLHDL, VLDL, LDLCALC,  LDLDIRECT   RADIOLOGY: No results found.   Additional studies/ records that were reviewed today include:  I personally reviewed the extensive records compiled after the patient's last office visit with me over the past 6 months.  ASSESSMENT:    1. Medication management   2. Atrial flutter, unspecified type ([place])   3. Essential hypertension   4. Hyperlipidemia, unspecified hyperlipidemia type   5. Flash pulmonary edema ([facility])   6. Controlled type 2 diabetes mellitus without complication, without long-term current use of insulin ([place])   7. Anticoagulation adequate      PLAN:  Ms. Neuharth is a very pleasant young appearing 83 year old Caucasian female who has a history of hypertension, hyperlipidemia, and type 2 diabetes mellitus.  In the past.  She was found to have SVT.  When I last saw her, she was hypertensive and I titrated labetalol and had started her on Cardizem.  Subsequent, she has developed atrial fibrillation.  Her echo Doppler study November 2017 showed an EF of 65-70%.  Her EF was recently found to be reduced which led to a [employer] study.  This did not reveal any ischemic changes.  An EF was 60%, but at that time, the patient was noted to be in atrial fibrillation.  I reviewed her records from her several evaluations.  She was started on anticoagulation with apixaban.  Unfortunately, she suffered a fall where she fractured her hip and apparently conservative management was performed  without surgery.  She had developed blood in stool on eliquis leading to its discontinuance.  She is now back on eliquis.  Her ECG today shows atrial flutter with variable block with a ventricular rate in the low 100s.  I recommended titration of amiodarone and she will increase this to 200 mg twice a day.  She continues to have edema despite being on furosemide and I will change this to torsemide, initially at 20 mg.  She's not having any chest pain.  She is breathing well without dyspnea.  She  continues to be on atorvastatin 40 mg for hyperlipidemia.  She is diabetic on metformin.  His several weeks.  Repeat laboratory consisting of chemistry, magnesium, TSH and CBC will be obtained.  I will see her in 4 weeks for cardiology reevaluation.  Time spent: 40 minutes  Medication Adjustments/Labs and Tests Ordered: Current medicines are reviewed at length with the patient today.  Concerns regarding medicines are outlined above.  Medication changes, Labs and Tests ordered today are listed in the Patient Instructions below.  Patient Instructions  Medication Instructions:  INCREASE amiodarone to 200 mg two times daily  STOP furosemide  START torsemide 20 mg daily  Labwork: Please return for labs in 2-3 weeks (CMET, CBC, Mag, TSH)  Our in office lab hours are Monday-Friday 8:00-4:30, closed for lunch 1-2 pm.  No appointment needed.  Testing/Procedures: Your physician has requested that you have an echocardiogram. Echocardiography is a painless test that uses sound waves to create images of your heart. It provides your doctor with information about the size and shape of your heart and how well your heart's chambers and valves are working. This procedure takes approximately one hour. There are no restrictions for this procedure. This will be done at our [facility] location:  [place]: Your physician recommends that you schedule a follow-up appointment in: 4 weeks with Dr. Claiborne Billings.    Any Other Special Instructions Will Be Listed Below (If Applicable).     If you need a refill on your cardiac medications before your next appointment, please call your pharmacy.      Signed, Shelva Majestic, MD  05/04/2017 12:23 PM    [facility] [address], [place], [place], [place]  [address] Phone: [phone]

## 2017-05-12 ENCOUNTER — Ambulatory Visit (HOSPITAL_COMMUNITY): Payer: No Typology Code available for payment source | Attending: Cardiovascular Disease

## 2017-05-12 ENCOUNTER — Other Ambulatory Visit: Payer: Medicare Other | Admitting: *Deleted

## 2017-05-12 ENCOUNTER — Other Ambulatory Visit: Payer: Self-pay

## 2017-05-12 DIAGNOSIS — I272 Pulmonary hypertension, unspecified: Secondary | ICD-10-CM | POA: Diagnosis not present

## 2017-05-12 DIAGNOSIS — I4891 Unspecified atrial fibrillation: Secondary | ICD-10-CM | POA: Diagnosis not present

## 2017-05-12 DIAGNOSIS — I083 Combined rheumatic disorders of mitral, aortic and tricuspid valves: Secondary | ICD-10-CM | POA: Insufficient documentation

## 2017-05-12 DIAGNOSIS — I700 Atherosclerosis of aorta: Secondary | ICD-10-CM

## 2017-05-12 DIAGNOSIS — I4892 Unspecified atrial flutter: Secondary | ICD-10-CM | POA: Diagnosis not present

## 2017-05-12 DIAGNOSIS — I482 Chronic atrial fibrillation, unspecified: Secondary | ICD-10-CM

## 2017-05-12 DIAGNOSIS — R58 Hemorrhage, not elsewhere classified: Secondary | ICD-10-CM

## 2017-05-12 DIAGNOSIS — I9589 Other hypotension: Secondary | ICD-10-CM

## 2017-05-12 DIAGNOSIS — I10 Essential (primary) hypertension: Secondary | ICD-10-CM

## 2017-05-12 DIAGNOSIS — I471 Supraventricular tachycardia: Secondary | ICD-10-CM

## 2017-05-12 NOTE — Addendum Note (Signed)
Addended by: Tonita PhoenixBOWDEN, Suheyb Raucci K on: 05/12/2017 02:50 PM   Modules accepted: Orders

## 2017-05-13 ENCOUNTER — Other Ambulatory Visit: Payer: Self-pay | Admitting: *Deleted

## 2017-05-13 DIAGNOSIS — E876 Hypokalemia: Secondary | ICD-10-CM

## 2017-05-13 DIAGNOSIS — R790 Abnormal level of blood mineral: Secondary | ICD-10-CM

## 2017-05-13 DIAGNOSIS — Z79899 Other long term (current) drug therapy: Secondary | ICD-10-CM

## 2017-05-13 LAB — COMPREHENSIVE METABOLIC PANEL
ALT: 19 IU/L (ref 0–32)
AST: 23 IU/L (ref 0–40)
Albumin/Globulin Ratio: 1.3 (ref 1.2–2.2)
Albumin: 3.7 g/dL (ref 3.5–4.7)
Alkaline Phosphatase: 134 IU/L — ABNORMAL HIGH (ref 39–117)
BUN/Creatinine Ratio: 12 (ref 12–28)
BUN: 9 mg/dL (ref 8–27)
Bilirubin Total: 0.7 mg/dL (ref 0.0–1.2)
CALCIUM: 7.6 mg/dL — AB (ref 8.7–10.3)
CO2: 32 mmol/L — AB (ref 20–29)
Chloride: 90 mmol/L — ABNORMAL LOW (ref 96–106)
Creatinine, Ser: 0.75 mg/dL (ref 0.57–1.00)
GFR, EST AFRICAN AMERICAN: 84 mL/min/{1.73_m2} (ref >59)
GFR, EST NON AFRICAN AMERICAN: 73 mL/min/{1.73_m2} (ref >59)
GLOBULIN, TOTAL: 2.9 g/dL (ref 1.5–4.5)
Glucose: 101 mg/dL — ABNORMAL HIGH (ref 65–99)
Potassium: 3 mmol/L — ABNORMAL LOW (ref 3.5–5.2)
SODIUM: 142 mmol/L (ref 134–144)
TOTAL PROTEIN: 6.6 g/dL (ref 6.0–8.5)

## 2017-05-13 LAB — CBC WITH DIFFERENTIAL/PLATELET
BASOS ABS: 0.1 10*3/uL (ref 0.0–0.2)
Basos: 1 %
EOS (ABSOLUTE): 0.2 10*3/uL (ref 0.0–0.4)
Eos: 3 %
HEMOGLOBIN: 11.4 g/dL (ref 11.1–15.9)
Hematocrit: 34.2 % (ref 34.0–46.6)
IMMATURE GRANS (ABS): 0 10*3/uL (ref 0.0–0.1)
IMMATURE GRANULOCYTES: 0 %
LYMPHS: 21 %
Lymphocytes Absolute: 1.8 10*3/uL (ref 0.7–3.1)
MCH: 32.3 pg (ref 26.6–33.0)
MCHC: 33.3 g/dL (ref 31.5–35.7)
MCV: 97 fL (ref 79–97)
MONOCYTES: 9 %
Monocytes Absolute: 0.8 10*3/uL (ref 0.1–0.9)
NEUTROS ABS: 5.6 10*3/uL (ref 1.4–7.0)
NEUTROS PCT: 66 %
PLATELETS: 474 10*3/uL — AB (ref 150–379)
RBC: 3.53 x10E6/uL — ABNORMAL LOW (ref 3.77–5.28)
RDW: 18.8 % — ABNORMAL HIGH (ref 12.3–15.4)
WBC: 8.5 10*3/uL (ref 3.4–10.8)

## 2017-05-13 LAB — TSH: TSH: 1.54 u[IU]/mL (ref 0.450–4.500)

## 2017-05-13 LAB — MAGNESIUM: Magnesium: 0.9 mg/dL — CL (ref 1.6–2.3)

## 2017-05-13 MED ORDER — POTASSIUM CHLORIDE ER 20 MEQ PO TBCR
20.0000 meq | EXTENDED_RELEASE_TABLET | Freq: Every day | ORAL | 3 refills | Status: DC
Start: 2017-05-13 — End: 2017-08-16

## 2017-05-13 MED ORDER — MAGNESIUM OXIDE 400 MG PO TABS: 400.0000 mg | ORAL_TABLET | Freq: Two times a day (BID) | ORAL | 0 refills | Status: DC

## 2017-05-18 ENCOUNTER — Telehealth: Payer: Self-pay | Admitting: Cardiovascular Disease

## 2017-05-18 NOTE — Telephone Encounter (Signed)
Returned call to patient no answer.LMTC. 

## 2017-05-18 NOTE — Telephone Encounter (Signed)
Returning Hayley's call from yesterday. °

## 2017-05-19 ENCOUNTER — Telehealth: Payer: Self-pay | Admitting: Cardiovascular Disease

## 2017-05-19 NOTE — Telephone Encounter (Signed)
Returning call from yesterday and the day before.

## 2017-05-19 NOTE — Telephone Encounter (Signed)
Patient calling, states that she has some questions about the potassium and magnesium medications

## 2017-05-19 NOTE — Telephone Encounter (Signed)
Left message for pt to call and ask to speak to someone.

## 2017-05-19 NOTE — Telephone Encounter (Signed)
Left message for pt to call.

## 2017-05-25 ENCOUNTER — Encounter: Payer: Self-pay | Admitting: *Deleted

## 2017-05-25 ENCOUNTER — Ambulatory Visit: Payer: Medicare Other | Admitting: Cardiovascular Disease

## 2017-05-25 ENCOUNTER — Ambulatory Visit (INDEPENDENT_AMBULATORY_CARE_PROVIDER_SITE_OTHER): Payer: Medicare Other | Admitting: Physician Assistant

## 2017-05-25 ENCOUNTER — Telehealth: Payer: Self-pay | Admitting: Cardiovascular Disease

## 2017-05-25 ENCOUNTER — Other Ambulatory Visit: Payer: Self-pay | Admitting: *Deleted

## 2017-05-25 VITALS — BP 98/56 | HR 78 | Ht 65.5 in

## 2017-05-25 DIAGNOSIS — I10 Essential (primary) hypertension: Secondary | ICD-10-CM

## 2017-05-25 DIAGNOSIS — D649 Anemia, unspecified: Secondary | ICD-10-CM

## 2017-05-25 DIAGNOSIS — E119 Type 2 diabetes mellitus without complications: Secondary | ICD-10-CM | POA: Diagnosis not present

## 2017-05-25 DIAGNOSIS — E785 Hyperlipidemia, unspecified: Secondary | ICD-10-CM | POA: Diagnosis not present

## 2017-05-25 DIAGNOSIS — I482 Chronic atrial fibrillation, unspecified: Secondary | ICD-10-CM

## 2017-05-25 DIAGNOSIS — E876 Hypokalemia: Secondary | ICD-10-CM | POA: Diagnosis not present

## 2017-05-25 MED ORDER — MAGNESIUM OXIDE 400 MG PO TABS: 400.0000 mg | ORAL_TABLET | Freq: Two times a day (BID) | ORAL | 0 refills | Status: DC

## 2017-05-25 MED ORDER — TORSEMIDE 20 MG PO TABS: 20.0000 mg | ORAL_TABLET | Freq: Every day | ORAL | 3 refills | Status: DC

## 2017-05-25 MED ORDER — MAGNESIUM OXIDE 400 MG PO TABS: ORAL_TABLET | ORAL | 0 refills | Status: DC

## 2017-05-25 MED ORDER — DILTIAZEM HCL ER 240 MG PO CP24: 240.0000 mg | ORAL_CAPSULE | Freq: Every day | ORAL | 3 refills | Status: DC

## 2017-05-25 MED ORDER — AMIODARONE HCL 200 MG PO TABS: 200.0000 mg | ORAL_TABLET | Freq: Two times a day (BID) | ORAL | 3 refills | Status: DC

## 2017-05-25 NOTE — Patient Instructions (Addendum)
Medication Instructions:  INCREASE Potassium 40 meq for Today and Tomorrow ONLY START Potassium Oxide 400 mg take 1 tablet twice a day for 3 days   Labwork: Your physician recommends that you return for lab work in: 1 week CBC, BMP, MAG  Testing/Procedures: None   Follow-Up: Your physician recommends that you schedule a follow-up appointment in: 1-2 months with Dr Tresa EndoKelly ONLY  Any Other Special Instructions Will Be Listed Below (If Applicable). Monitor your blood pressure and heart rate and if your top number is lower than contact the office   If you need a refill on your cardiac medications before your next appointment, please call your pharmacy.

## 2017-05-25 NOTE — Telephone Encounter (Signed)
This encounter was created in error - please disregard.

## 2017-05-25 NOTE — Telephone Encounter (Signed)
New Message      *STAT* If patient is at the pharmacy, call can be transferred to refill team.   1. Which medications need to be refilled? (please list name of each medication and dose if known) torsemide (DEMADEX) 20 MG tablet amiodarone (PACERONE) 200 MG tablet diltiazem (DILACOR XR) 240 MG 24 hr capsule 2. Which pharmacy/location (including street and city if local pharmacy) is medication to be sent to? [facility]   3. Do they need a 30 day or 90 day supply?  30

## 2017-05-25 NOTE — Progress Notes (Signed)
Cardiology Office Note    Date:  05/27/2017   ID:  Tracey PingConstance Coby, DOB 03/21/1930, MRN [id number]  PCP:  Swazil[place]Jordan, Julie M, NP  Cardiologist:  Dr. Tresa EndoKelly  Chief Complaint  Patient presents with  . Follow-up    1 month post echo    History of Present Illness:  Tracey Porter is a 83 y.o. female with PMH of HTN, HLD, DM and SVT. She originally came from [place] and moved to [place]  8 years ago. In October 2017, she [facility] to [facility] with initial urinary complaints and found to have SVT. ED physician discussed the case with cardiologist on call and she was placed on diltiazem LA 120 mg daily. Her diltiazem dose was eventually uptitrated to Diltiazem CD 240 mg daily along with labetalol 200 mg twice a day. Hydrochlorothiazide was discontinued. 2-D echo obtained on 11/80/2017 showed EF 65-70%, normal wall motion, grade 2 DD, mitral annular calcification with mild to moderate MR, mild aortic valve stenosis, severe LA dilatation, moderate TR, mild to moderate pulmonary hypertension with PA peak pressure 44 mmHg. She continued to take atorvastatin 40 mg for hyperlipidemia and metformin for diabetes. Her last office visit with Dr. Tresa EndoKelly was on 10/22/2016, her labetalol was increased to 300 mg in a.m. and 200 mg in p.m.  She was [facility] on 01/28/2017 with acute onset hypertensive pulmonary edema. She rapidly improved with diuresis and blood pressure control. Echocardiogram revealed mildly reduced LV function with EF 45-50% with mild anteroseptal wall motion abnormality, moderate mitral regurgitation, mild-to-moderate tricuspid regurgitation, estimated RV systolic pressure 48 mmHg. Although detectable, her troponins were not diagnostic of MI. Her labetalol dosing was further uptitrated and low-dose Lasix was added. Interestingly, it appears patient was at her primary care physician's office the day before hospital admission and the  blood pressure at the time was 112/80. Given her recent LV dysfunction ordered a Lexiscan Myoview. Lexiscan Myoview performed on 02/22/2017 came back low risk with EF 60%, no ischemia however patient was noted to be in atrial fibrillation during the study.  I last saw the patient on 02/24/2017 for atrial fibrillation. She had no cardiac awareness. I discussed with the patient regarding various options, and eventually I increased her dose hasn't to 360 mg daily and added eliquis. Unfortunately, she was later admitted on 03/31/2017 with GI bleed, her hemoglobin was 7.2. She was seen by GI and underwent EGD which showed esophagitis. Prior to that, she was also admitted for pelvic fractures as well. She was taken off the eliquis during the hospital, however later restarted on it on 04/26/2017. She was last seen by Dr. Tresa EndoKelly on 04/28/2017, her Lasix was switched to torsemide 20 mg daily.  A repeat echocardiogram was obtained which showed EF 65-70%. Her recent TSH was normal.  Patient presents today for cardiology office visit. She remains fatigued. Recently her hemoglobin dropped from 11.4 down to 9.3. She will need a CBC in one week. She is currently on 20 mg of torsemide and according to the daughter, she recently lost roughly 30 pounds due to diuresis. She will remain volume overloaded with at least 1+ pitting edema in bilateral lower extremity. She will need to continue to take the torsemide. Her magnesium was recently low at 1.4 on 05/23/2017. She was placed on magnesium oxide 400 mg twice a day for 3 days. I will see the patient back in one month. I rechecked her manual blood pressure, it was 98/64. Her blood  pressure is soft, however she denies any dizziness, blurred vision or feeling of passing out. Her heart rate is better controlled, although initial heart rate was in the 70s, however when examined her her heart rate is currently staying in the low 100s range. We'll continue on the current control therapy  including labetalol, diltiazem and amiodarone.     Past Medical History:  Diagnosis Date  . Diabetes mellitus without complication (HCC)   . Hypertension     Past Surgical History:  Procedure Laterality Date  . ABDOMINAL HYSTERECTOMY  1976  . EYE SURGERY      Current Medications: Outpatient Medications Prior to Visit  Medication Sig Dispense Refill  . acyclovir (ZOVIRAX) 400 MG tablet Take 800 mg by mouth 2 (two) times daily.    [name] alendronate (FOSAMAX) 70 MG tablet Take 70 mg by mouth once a week.    [name] apixaban (ELIQUIS) 5 MG TABS tablet Take 1 tablet (5 mg total) by mouth 2 (two) times daily. Please cancel previous rx's sent; call pt with price before filling 180 tablet 3  . atorvastatin (LIPITOR) 40 MG tablet Take 1 tablet (40 mg total) by mouth daily. 90 tablet 1  . Cholecalciferol (D-3-5) 5000 units capsule Take 5,000 Units by mouth daily.    [name] ibuprofen (ADVIL,MOTRIN) 600 MG tablet Take 1 tablet (600 mg total) by mouth every 6 (six) hours as needed. 30 tablet 0  . labetalol (NORMODYNE) 300 MG tablet Take 1 tablet (300 mg total) by mouth 2 (two) times daily. Please cancel previous rx's sent 180 tablet 3  . loperamide (IMODIUM) 2 MG capsule Take 1 capsule (2 mg total) by mouth as needed for diarrhea or loose stools. 30 capsule 0  . metFORMIN (GLUCOPHAGE-XR) 500 MG 24 hr tablet Take 500 mg by mouth every morning.    [name] MYRBETRIQ 25 MG TB24 tablet Take 25 mg by mouth daily.    . potassium chloride 20 MEQ TBCR Take 20 mEq by mouth daily. 90 tablet 3  . prednisoLONE acetate (PRED FORTE) 1 % ophthalmic suspension Place 1 drop into the right eye 4 (four) times daily.     . Teriparatide, Recombinant, (FORTEO [place]) Inject 20 mcg daily into the skin.    [name] amiodarone (PACERONE) 200 MG tablet Take 1 tablet (200 mg total) by mouth 2 (two) times daily. 180 tablet 3  . diltiazem (CARDIZEM CD) 120 MG 24 hr capsule Take 1 capsule (120 mg total) by mouth daily. 90 capsule 3  . diltiazem (DILACOR XR)  240 MG 24 hr capsule Take 240 mg daily by mouth.    . magnesium oxide (MAG-OX) 400 MG tablet Take 1 tablet (400 mg total) by mouth 2 (two) times daily. For 3 days 6 tablet 0  . torsemide (DEMADEX) 20 MG tablet Take 1 tablet (20 mg total) by mouth daily. 90 tablet 3  . nitroGLYCERIN (NITROSTAT) 0.4 MG SL tablet Place 1 tablet (0.4 mg total) under the tongue every 5 (five) minutes as needed for chest pain. 25 tablet 3   No facility-administered medications prior to visit.      Allergies:   Penicillins   Social History   Socioeconomic History  . Marital status: Widowed    Spouse name: None  . Number of children: None  . Years of education: None  . Highest education level: None  Social Needs  . Financial resource strain: None  . Food insecurity - worry: None  . Food insecurity - inability: None  . Transportation  needs - medical: None  . Transportation needs - non-medical: None  Occupational History  . None  Tobacco Use  . Smoking status: Former Games developer  . Smokeless tobacco: Never Used  Substance and Sexual Activity  . Alcohol use: Yes  . Drug use: No  . Sexual activity: None  Other Topics Concern  . None  Social History Narrative  . None     Family History:  The patient's family history includes Lung cancer in her father; Lymphoma in her daughter.   ROS:   Please see the history of present illness.    ROS All other systems reviewed and are negative.   PHYSICAL EXAM:   VS:  BP (!) 98/56   Pulse 78   Ht 5' 5.5" (1.664 m)   BMI 33.50 kg/m    GEN: Well nourished, well developed, in no acute distress  HEENT: normal  Neck: no JVD, carotid bruits, or masses Cardiac: irregular, tachycardic; no murmurs, rubs, or gallops,no edema  Respiratory:  clear to auscultation bilaterally, normal work of breathing GI: soft, nontender, nondistended, + BS MS: no deformity or atrophy  Skin: warm and dry, no rash Neuro:  Alert and Oriented x 3, Strength and sensation are intact Psych:  euthymic mood, full affect  Wt Readings from Last 3 Encounters:  04/28/17 204 lb 6.4 oz (92.7 kg)  03/31/17 207 lb (93.9 kg)  02/24/17 176 lb 6.4 oz (80 kg)      Studies/Labs Reviewed:   EKG:  EKG is not ordered today.    Recent Labs: 03/24/2017: B Natriuretic Peptide 373.3 05/12/2017: ALT 19; BUN 9; Creatinine, Ser 0.75; Hemoglobin 11.4; Magnesium 0.9; Platelets 474; Potassium 3.0; Sodium 142; TSH 1.540   Lipid Panel No results found for: CHOL, TRIG, HDL, CHOLHDL, VLDL, LDLCALC, LDLDIRECT  Additional studies/ records that were reviewed today include:   Echo 05/12/2017 LV EF: 65% -   70%  ------------------------------------------------------------------- Study Conclusions  - Left ventricle: The cavity size was normal. Wall thickness was   normal. Systolic function was vigorous. The estimated ejection   fraction was in the range of 65% to 70%. Wall motion was normal;   there were no regional wall motion abnormalities. Indeterminant   diastolic function (atrial fibrillation). - Aortic valve: Trileaflet; moderately calcified leaflets. There   was mild stenosis. Mean gradient (S): 10 mm Hg. - Aorta: Ascending aortic diameter: 39 mm (S). - Ascending aorta: The ascending aorta was mildly dilated. - Mitral valve: Moderately to severely calcified annulus. Mildly   calcified leaflets . There was mild regurgitation. - Left atrium: The atrium was moderately dilated. - Right atrium: The atrium was moderately dilated. - Tricuspid valve: There was moderate regurgitation. Peak RV-RA   gradient (S): 38 mm Hg. - Pulmonary arteries: PA peak pressure: 41 mm Hg (S). - Inferior vena cava: The vessel was normal in size. The   respirophasic diameter changes were in the normal range (= 50%),   consistent with normal central venous pressure. - Pericardium, extracardiac: A trivial pericardial effusion was   identified.  Impressions:  - The patient appeared to be in atrial fibrillation.  Normal LV size   with EF 65-70%. Normal RV size and systolic function. Moderate   biatrial enlargement. Mild MR, moderate TR. Mild aortic stenosis.   Mild pulmonary hypertension.    ASSESSMENT:    1. Chronic atrial fibrillation (HCC)   2. Hypokalemia   3. Anemia, unspecified type   4. Essential hypertension   5. Hyperlipidemia, unspecified hyperlipidemia type  6. Controlled type 2 diabetes mellitus without complication, without long-term current use of insulin (HCC)      PLAN:  In order of problems listed above:  1. Chronic atrial fibrillation: Heart rate barely under control. Blood pressure soft, unable to up titrate rate control therapy. Fortunately, recent echocardiogram showed ejection fraction remains stable. Does have bilateral moderate atrial dilatation. We will continue on the current amiodarone 200 mg twice a day. Continue eliquis 5 mg twice a day.  2. Hypokalemia: Recently potassium level was 3.0. It did improve slightly, will obtain basic metabolic panel.  3. Hypertension: blood pressure soft, unable to up titrate medication. Patient has been instructed to contact cardiology if systolic blood pressure drop below 95 mmHg  4. Hyperlipidemia: on Lipitor 40 mg daily  5. DM 2: managed by primary care provider  6. Anemia: Hemoglobin improved On 05/12/2017, however repeat lab work recently showed hemoglobin went down again, we'll repeat a CBC.  7. Hypomagnesemia: Recent magnesium level I.4, although magnesium oxide is listed under her medication list, however her daughter says she is not taking it. She will need magnesium oxide 400 mg twice a day for 3 days.     Medication Adjustments/Labs and Tests Ordered: Current medicines are reviewed at length with the patient today.  Concerns regarding medicines are outlined above.  Medication changes, Labs and Tests ordered today are listed in the Patient Instructions below. Patient Instructions  Medication Instructions:  INCREASE  Potassium 40 meq for Today and Tomorrow ONLY START Magnesium Oxide 400 mg take 1 tablet twice a day for 3 days   Labwork: Your physician recommends that you return for lab work in: 1 week CBC, BMP, MAG  Testing/Procedures: None   Follow-Up: Your physician recommends that you schedule a follow-up appointment in: 1-2 months with Dr Tresa EndoKelly ONLY  Any Other Special Instructions Will Be Listed Below (If Applicable). Monitor your blood pressure and heart rate and if your top number is lower than contact the office   If you need a refill on your cardiac medications before your next appointment, please call your pharmacy.     Ramond DialSigned, Miamor Ayler, [place]  05/27/2017 10:01 AM    [facility] [address], [place], [place]  [address] Phone: [phone]; Fax: [phone]

## 2017-05-25 NOTE — Telephone Encounter (Signed)
Patient is seeing Hao, PA today 05/25/17 

## 2017-05-25 NOTE — Telephone Encounter (Deleted)
   [facility] Pre-operative Risk Assessment    Request for surgical clearance:  1. What type of surgery is being performed? Bilateral Mastectomies   2. When is this surgery scheduled? TBD   3. Are there any medications that need to be held prior to surgery and how long? Eliquis   4. Practice name and name of physician performing surgery? [facility] [place] [facility] Dr. Dalbert Batman   5. What is your office phone and fax number? [phone]  6. Anesthesia type (None, local, MAC, general) ?   Yuliet Needs A Majestic Molony 05/25/2017, 10:14 AM  _________________________________________________________________   (provider comments below)

## 2017-05-25 NOTE — Telephone Encounter (Signed)
Patient is seeing Wynema BirchHao, [place] today 05/25/17

## 2017-05-26 ENCOUNTER — Other Ambulatory Visit: Payer: Self-pay | Admitting: *Deleted

## 2017-05-26 MED ORDER — TORSEMIDE 20 MG PO TABS: 20.0000 mg | ORAL_TABLET | Freq: Every day | ORAL | 3 refills | Status: DC

## 2017-05-26 MED ORDER — AMIODARONE HCL 200 MG PO TABS: 200.0000 mg | ORAL_TABLET | Freq: Two times a day (BID) | ORAL | 3 refills | Status: DC

## 2017-05-27 ENCOUNTER — Encounter: Payer: Self-pay | Admitting: Physician Assistant

## 2017-06-07 ENCOUNTER — Telehealth: Payer: Self-pay | Admitting: Physician Assistant

## 2017-06-07 NOTE — Telephone Encounter (Signed)
Should be in Care Everywhere tomorrow or Thursday Will check again then

## 2017-06-07 NOTE — Telephone Encounter (Signed)
New message   Pt daughter called to tell Tracey Porter that Julie SwazilandJordan did the pt labs today

## 2017-06-10 ENCOUNTER — Telehealth: Payer: Self-pay | Admitting: *Deleted

## 2017-06-10 MED ORDER — MAGNESIUM OXIDE 400 MG PO TABS: 400.0000 mg | ORAL_TABLET | Freq: Two times a day (BID) | ORAL | 2 refills | Status: DC

## 2017-06-10 MED ORDER — MAGNESIUM OXIDE 400 MG PO TABS: 400.0000 mg | ORAL_TABLET | Freq: Two times a day (BID) | ORAL | 1 refills | Status: DC

## 2017-06-10 NOTE — Telephone Encounter (Signed)
-----   Message from ArbelaHao Meng, [place] sent at 06/10/2017 11:21 AM EST ----- Outside lab reviewed, renal function stable. Potassium improved. Liver function stable. Magnesium still low, recommend start on a regimen of 400mg  twice a day of Magnesium oxide for a week, then reduce down to 400mg  daily thereafter. Recheck Magnesium in a month. Anemia has significantly improved, hemoglobin jumped from 7.2 two month ago to 11, essentially back up to normal range.

## 2017-06-10 NOTE — Telephone Encounter (Signed)
[facility] outside [facility]

## 2017-06-10 NOTE — Telephone Encounter (Signed)
See result note, pt advised.

## 2017-07-02 ENCOUNTER — Other Ambulatory Visit: Payer: Self-pay | Admitting: Physician Assistant

## 2017-07-04 NOTE — Telephone Encounter (Signed)
REFILL 

## 2017-07-04 NOTE — Telephone Encounter (Signed)
Please review for refill. Thanks!  

## 2017-07-18 ENCOUNTER — Telehealth: Payer: Self-pay | Admitting: Cardiovascular Disease

## 2017-07-18 NOTE — Telephone Encounter (Signed)
Recommend the patient hold her torsemide; if blood pressure continues to be low, may need to reduce labetalol dose from 300 twice a day down to 200 mg twice a day.

## 2017-07-18 NOTE — Telephone Encounter (Signed)
New message     Pt c/o BP issue: STAT if pt c/o blurred vision, one-sided weakness or slurred speech  1. What are your last 5 BP readings?  80/40 and 78/48   2. Are you having any other symptoms (ex. Dizziness, headache, blurred vision, passed out)? Dizziness   3. What is your BP issue? They stopped  labetalol (NORMODYNE) 300 MG tablet TAKE 1 TABLET BY MOUTH TWICE DAILY   They did not give it to her last night , then they gave her a dose this morning, and they want to see what Dr Tresa EndoKelly advises how to proceed

## 2017-07-18 NOTE — Telephone Encounter (Signed)
Spoke with daughter and patient did have systolic blood pressure a little less than 100 on Friday when PT saw her. PT had her get up and move around some and systolic blood pressure up to 161115. Last night blood pressure was 80/40 HR 78 daughter did not give her the Labetalol but thought machine was probably off since patient felt good. This am she had all her morning medications. Saw Julie Swazil[place]Jordan NP this morning and blood pressure 78/48 HR 98 who said to follow up with Dr Tresa EndoKelly. Patient is having fatigue and dizziness when sits up. Will forward to Dr Tresa EndoKelly for review

## 2017-07-18 NOTE — Telephone Encounter (Signed)
Left message to call back  

## 2017-07-19 MED ORDER — ACYCLOVIR 400 MG PO TABS
800.00 | ORAL_TABLET | ORAL | Status: DC
Start: 2017-07-19 — End: 2017-07-19

## 2017-07-19 MED ORDER — APIXABAN 5 MG PO TABS
5.00 | ORAL_TABLET | ORAL | Status: DC
Start: 2017-07-19 — End: 2017-07-19

## 2017-07-19 MED ORDER — TIMOLOL MALEATE 0.5 % OP SOLN
1.00 | OPHTHALMIC | Status: DC
Start: 2017-07-19 — End: 2017-07-19

## 2017-07-19 MED ORDER — PREDNISOLONE ACETATE 1 % OP SUSP
1.00 | OPHTHALMIC | Status: DC
Start: 2017-07-19 — End: 2017-07-19

## 2017-07-19 MED ORDER — TRAMADOL HCL 50 MG PO TABS
50.00 | ORAL_TABLET | ORAL | Status: DC
Start: ? — End: 2017-07-19

## 2017-07-19 MED ORDER — CEFTRIAXONE SODIUM 1 G IJ SOLR
1.00 g | INTRAMUSCULAR | Status: DC
Start: 2017-07-20 — End: 2017-07-19

## 2017-07-19 MED ORDER — IPRATROPIUM-ALBUTEROL 0.5-2.5 (3) MG/3ML IN SOLN
3.00 | RESPIRATORY_TRACT | Status: DC
Start: ? — End: 2017-07-19

## 2017-07-19 MED ORDER — PANTOPRAZOLE SODIUM 40 MG PO TBEC
40.00 | DELAYED_RELEASE_TABLET | ORAL | Status: DC
Start: 2017-07-19 — End: 2017-07-19

## 2017-07-19 MED ORDER — INSULIN LISPRO 100 UNIT/ML ~~LOC~~ SOLN
2.00 | SUBCUTANEOUS | Status: DC
Start: 2017-07-19 — End: 2017-07-19

## 2017-07-19 MED ORDER — OXYCODONE HCL ER 10 MG PO T12A
10.00 | EXTENDED_RELEASE_TABLET | ORAL | Status: DC
Start: 2017-07-19 — End: 2017-07-19

## 2017-07-19 MED ORDER — LIDOCAINE 5 % EX PTCH
1.00 | MEDICATED_PATCH | CUTANEOUS | Status: DC
Start: 2017-07-20 — End: 2017-07-19

## 2017-07-19 MED ORDER — DEXTROSE 10 % IV SOLN
125.00 | INTRAVENOUS | Status: DC
Start: ? — End: 2017-07-19

## 2017-07-19 MED ORDER — LACTATED RINGERS IV SOLN
INTRAVENOUS | Status: DC
Start: ? — End: 2017-07-19

## 2017-07-19 MED ORDER — AMIODARONE HCL 200 MG PO TABS
200.00 | ORAL_TABLET | ORAL | Status: DC
Start: 2017-07-19 — End: 2017-07-19

## 2017-07-19 NOTE — Telephone Encounter (Signed)
Spoke to daughter, states she took patient to the hospital last night and was diagnosed with UTI and dehydration.  Made aware of Dr. Tresa EndoKelly recommendations to hold torsemide and if BP continues to run low, decrease labetalol.   Daughter verbalized understanding.

## 2017-07-20 NOTE — Telephone Encounter (Signed)
Thank you :)

## 2017-07-27 ENCOUNTER — Telehealth: Payer: Self-pay | Admitting: Cardiovascular Disease

## 2017-07-27 NOTE — Telephone Encounter (Signed)
Spoke with patients daughter and she was recently d/c from [facility]. At d/c her Labetalol and decreased her Diltiazem to 120 mg daily secondary to orthostatic hypotension. She just wanted for Dr Tresa EndoKelly to be aware. Patient does continue to have SBP in the 90's and is dizzy. Scheduled appointment with Eda PaschalLuke K PA tomorrow.

## 2017-07-27 NOTE — Telephone Encounter (Signed)
New Message  Pt c/o medication issue:  1. Name of Medication: Lovastatin and Diltiazem   2. How are you currently taking this medication (dosage and times per day)? N/A  3. Are you having a reaction (difficulty breathing--STAT)? no  4. What is your medication issue? pe rpt daughter pt was recently released from the hospital. Daughter states they took pt off lovastatin, and pts diltiazem was reduced in dosage. Pt daughter would like to speak with RN

## 2017-07-28 ENCOUNTER — Encounter: Payer: Self-pay | Admitting: Cardiology

## 2017-07-28 ENCOUNTER — Ambulatory Visit (INDEPENDENT_AMBULATORY_CARE_PROVIDER_SITE_OTHER): Payer: Medicare Other | Admitting: Cardiology

## 2017-07-28 VITALS — BP 109/67 | HR 89 | Ht 65.5 in | Wt 157.0 lb

## 2017-07-28 DIAGNOSIS — I5032 Chronic diastolic (congestive) heart failure: Secondary | ICD-10-CM | POA: Diagnosis not present

## 2017-07-28 DIAGNOSIS — I480 Paroxysmal atrial fibrillation: Secondary | ICD-10-CM | POA: Diagnosis not present

## 2017-07-28 DIAGNOSIS — Z7901 Long term (current) use of anticoagulants: Secondary | ICD-10-CM

## 2017-07-28 DIAGNOSIS — E119 Type 2 diabetes mellitus without complications: Secondary | ICD-10-CM | POA: Diagnosis not present

## 2017-07-28 DIAGNOSIS — S32811G Multiple fractures of pelvis with unstable disruption of pelvic ring, subsequent encounter for fracture with delayed healing: Secondary | ICD-10-CM

## 2017-07-28 DIAGNOSIS — Z8719 Personal history of other diseases of the digestive system: Secondary | ICD-10-CM

## 2017-07-28 DIAGNOSIS — I951 Orthostatic hypotension: Secondary | ICD-10-CM

## 2017-07-28 MED ORDER — METOPROLOL SUCCINATE ER 25 MG PO TB24: 25.0000 mg | ORAL_TABLET | Freq: Every day | ORAL | 6 refills | Status: DC

## 2017-07-28 MED ORDER — AMIODARONE HCL 200 MG PO TABS: 200.0000 mg | ORAL_TABLET | Freq: Every day | ORAL | 3 refills | Status: DC

## 2017-07-28 NOTE — Assessment & Plan Note (Signed)
Medications have been adjusted several times over the past 6 months. Stop Diltiazem now that she is in NSR-add low dose Toprol (Labetalol has been stopped).

## 2017-07-28 NOTE — Patient Instructions (Addendum)
Medication Instructions:  STOP Diltiazem START Toprol (Metoprolol) 25 mg take 1 tablet once a day DECREASE Amiodarone (Pacerone) 200 mg to once a day   Labwork: None   Testing/Procedures: None   Follow-Up: Keep upcoming appointment with Dr Tresa EndoKelly as scheduled  Any Other Special Instructions Will Be Listed Below (If Applicable).  If you need a refill on your cardiac medications before your next appointment, please call your pharmacy.

## 2017-07-28 NOTE — Assessment & Plan Note (Signed)
Normal LVF with bi atrial enlargement by echo Nov 2018

## 2017-07-28 NOTE — Assessment & Plan Note (Signed)
Pt has converted to NSR-currently on Amiodarone 200 mg BID, will decrease to 200 mg daily

## 2017-07-28 NOTE — Assessment & Plan Note (Signed)
CHA2DS2 VASc=6, she is on Eliquis

## 2017-07-28 NOTE — Progress Notes (Signed)
07/28/2017 Tracey Porter   05/22/1930  [id number]  Primary Physician SwazilandJordan, Julie M, NP Primary Cardiologist: Dr Tresa EndoKelly  HPI:  84 y/[place] Here for follow up with a history of orthostatic hypotension.  She has a a compicated medical history. She goes [facility] and [facility] and [facility] for her care. Medical problems include HTN, HLD, DM and SVT. She originally came from [place] and moved [place] 8 years ago. In October 2017, she presented to [facility] with initial urinary complaints and found to have SVT. ED physician discussed the case with cardiologist on call and she was placed on diltiazem LA 120 mg daily. Her diltiazem dose was eventually uptitrated to Diltiazem CD 240 mg daily along with labetalol 200 mg twice a day. 2-D echo obtained on 11/80/2017 showed EF 65-70%, normal wall motion, grade 2 DD, mitral annular calcification with mild to moderate MR, mild aortic valve stenosis, severe LA dilatation, moderate TR, mild to moderate pulmonary hypertension with PA peak pressure 44 mmHg. She saw Dr. Tresa EndoKelly on 10/22/2016, her labetalol was increased to 300 mg in a.m. and 200 mg in p.m.  She was [facility] on 01/28/2017 with acute onset hypertensive pulmonary edema. She rapidly improved with diuresis and blood pressure control. Echocardiogram revealed mildly reduced LV function with EF 45-50% with mild anteroseptal wall motion abnormality, moderate mitral regurgitation, mild-to-moderate tricuspid regurgitation, estimated RV systolic pressure 48 mmHg. Lexiscan Myoview performed on 02/22/2017 came back low risk with EF 60%, no ischemia however patient was noted to be in new onset atrial fibrillation during the study.  On f/u in our office  increased her Diltiazem was increased to 360 mg daily and Eliquis was added. Unfortunately, she was later admitted on 03/31/2017 with GI bleed, her hemoglobin was 7.2. She was seen  by GI and underwent EGD which showed esophagitis. Prior to that, she was also admitted for pelvic fractures as well. She was taken off the eliquis during the hospital, however later restarted on it on 04/26/2017. A repeat echocardiogram was obtained which showed EF 65-70%. TSH was normal in Nov 2018.  Recently she has been admitted [facility] with orthostatic hypotension and her medications have been decreased. Amiodarone was added for rate control. She is in the office today for follow up, her daughter says her mother's B/P continues to drop at home when she gets up. Her mother's main complaint is hip pain but she also admits to generalized fatigue. A recent Hgb was > 11. In the office today she in NSR.     Current Outpatient Medications  Medication Sig Dispense Refill  . acyclovir (ZOVIRAX) 400 MG tablet Take 800 mg by mouth 2 (two) times daily.    [name]. alendronate (FOSAMAX) 70 MG tablet Take 70 mg by mouth once a week.    [name]. amiodarone (PACERONE) 200 MG tablet Take 1 tablet (200 mg total) by mouth daily. 90 tablet 3  . apixaban (ELIQUIS) 5 MG TABS tablet Take 1 tablet (5 mg total) by mouth 2 (two) times daily. Please cancel previous rx's sent; call pt with price before filling 180 tablet 3  . atorvastatin (LIPITOR) 40 MG tablet Take 1 tablet (40 mg total) by mouth daily. 90 tablet 1  . Cholecalciferol (D-3-5) 5000 units capsule Take 5,000 Units by mouth daily.    [name]. ibuprofen (ADVIL,MOTRIN) 600 MG tablet Take 1 tablet (600 mg total) by mouth every 6 (six) hours as  needed. 30 tablet 0  . loperamide (IMODIUM) 2 MG capsule Take 1 capsule (2 mg total) by mouth as needed for diarrhea or loose stools. 30 capsule 0  . magnesium oxide (MAG-OX) 400 MG tablet Take 1 tablet (400 mg total) by mouth 2 (two) times daily. 60 tablet 1  . metFORMIN (GLUCOPHAGE-XR) 500 MG 24 hr tablet Take 500 mg by mouth every morning.    [name] MYRBETRIQ 25 MG TB24 tablet Take 25 mg by mouth daily.    . nitroGLYCERIN (NITROSTAT) 0.4  MG SL tablet Place 0.4 mg under the tongue every 5 (five) minutes as needed for chest pain.    . potassium chloride 20 MEQ TBCR Take 20 mEq by mouth daily. 90 tablet 3  . prednisoLONE acetate (PRED FORTE) 1 % ophthalmic suspension Place 1 drop into the right eye 4 (four) times daily.     . Teriparatide, Recombinant, (FORTEO [place]) Inject 20 mcg daily into the skin.    . metoprolol succinate (TOPROL XL) 25 MG 24 hr tablet Take 1 tablet (25 mg total) by mouth daily. 30 tablet 6   No current facility-administered medications for this visit.     Allergies  Allergen Reactions  . Penicillins Rash    Past Medical History:  Diagnosis Date  . Diabetes mellitus without complication (HCC)   . Hypertension     Social History   Socioeconomic History  . Marital status: Widowed    Spouse name: Not on file  . Number of children: Not on file  . Years of education: Not on file  . Highest education level: Not on file  Social Needs  . Financial resource strain: Not on file  . Food insecurity - worry: Not on file  . Food insecurity - inability: Not on file  . Transportation needs - medical: Not on file  . Transportation needs - non-medical: Not on file  Occupational History  . Not on file  Tobacco Use  . Smoking status: Former Games developer  . Smokeless tobacco: Never Used  Substance and Sexual Activity  . Alcohol use: Yes  . Drug use: No  . Sexual activity: Not on file  Other Topics Concern  . Not on file  Social History Narrative  . Not on file     Family History  Problem Relation Age of Onset  . Lung cancer Father   . Lymphoma Daughter      Review of Systems: General: negative for chills, fever, night sweats or weight changes.  Cardiovascular: negative for chest pain, dyspnea on exertion, edema, orthopnea, palpitations, paroxysmal nocturnal dyspnea or shortness of breath Dermatological: negative for rash Respiratory: negative for cough or wheezing Urologic: negative for  hematuria Abdominal: negative for nausea, vomiting, diarrhea, bright red blood per rectum, melena, or hematemesis Neurologic: negative for visual changes, syncope, or dizziness All other systems reviewed and are otherwise negative except as noted above.    Blood pressure 109/67, pulse 89, height 5' 5.5" (1.664 m), weight 157 lb (71.2 kg), SpO2 94 %.  General appearance: alert, cooperative, appears stated age, no distress and in wheel chair Neck: no JVD Lungs: few basilar rales Heart: regular rate and rhythm Extremities: no edema Skin: pale cool dry Neurologic: Grossly normal  EKG NSR  ASSESSMENT AND PLAN:   PAF (paroxysmal atrial fibrillation) (HCC) Pt has converted to NSR-currently on Amiodarone 200 mg BID, will decrease to 200 mg daily  Orthostatic hypotension Medications have been adjusted several times over the past 6 months. Stop Diltiazem now that  she is in NSR-add low dose Toprol (Labetalol has been stopped).   Chronic anticoagulation CHA2DS2 VASc=6, she is on Eliquis  Chronic diastolic heart failure (HCC) Normal LVF with bi atrial enlargement by echo Nov 2018  History of GI bleed Recent Hgb > 11   PLAN I suggested we stop her Diltiazem, add Toprol 25 mg, and decrease her Diltiazem to 200 mg daily. She has a follow up with Dr Tresa Endo in Feb and will keep this. She'll need a TSH then.   Corine Shelter PA-C 07/28/2017 10:23 AM

## 2017-07-28 NOTE — Assessment & Plan Note (Signed)
Recent Hgb > 11

## 2017-08-01 NOTE — Addendum Note (Signed)
Addended by: Chana BodeGREEN, Yunuen Mordan L on: 08/01/2017 02:13 PM   Modules accepted: Orders

## 2017-08-08 ENCOUNTER — Telehealth: Payer: Self-pay | Admitting: Cardiovascular Disease

## 2017-08-08 NOTE — Telephone Encounter (Signed)
Please call,she says both her hips are hurting really bad.

## 2017-08-08 NOTE — Telephone Encounter (Signed)
Left message for pt to call.

## 2017-08-09 ENCOUNTER — Ambulatory Visit: Payer: Medicare Other | Admitting: Adult Health

## 2017-08-09 NOTE — Telephone Encounter (Signed)
Left message for pt to call.

## 2017-08-10 NOTE — Telephone Encounter (Signed)
LMTCB + appointment reminder for Aug 16, 2017 with Dr. Tresa EndoKelly

## 2017-08-16 ENCOUNTER — Ambulatory Visit (INDEPENDENT_AMBULATORY_CARE_PROVIDER_SITE_OTHER): Payer: Medicare Other | Admitting: Cardiovascular Disease

## 2017-08-16 ENCOUNTER — Encounter: Payer: Self-pay | Admitting: Cardiovascular Disease

## 2017-08-16 VITALS — BP 80/52 | HR 80 | Resp 16 | Ht 65.25 in | Wt 143.0 lb

## 2017-08-16 DIAGNOSIS — I959 Hypotension, unspecified: Secondary | ICD-10-CM

## 2017-08-16 DIAGNOSIS — Z7901 Long term (current) use of anticoagulants: Secondary | ICD-10-CM | POA: Diagnosis not present

## 2017-08-16 DIAGNOSIS — I471 Supraventricular tachycardia: Secondary | ICD-10-CM | POA: Diagnosis not present

## 2017-08-16 DIAGNOSIS — S32811G Multiple fractures of pelvis with unstable disruption of pelvic ring, subsequent encounter for fracture with delayed healing: Secondary | ICD-10-CM

## 2017-08-16 DIAGNOSIS — I480 Paroxysmal atrial fibrillation: Secondary | ICD-10-CM | POA: Diagnosis not present

## 2017-08-16 DIAGNOSIS — D649 Anemia, unspecified: Secondary | ICD-10-CM

## 2017-08-16 MED ORDER — METOPROLOL SUCCINATE ER 25 MG PO TB24: 12.5000 mg | ORAL_TABLET | Freq: Every day | ORAL | 6 refills | Status: DC

## 2017-08-16 NOTE — Progress Notes (Signed)
Cardiology Office Note    Date:  08/22/2017   ID:  Tracey Porter, DOB December 24, 1929, MRN [id number]  PCP:  Martinique, Julie M, NP  Cardiologist:  Shelva Majestic, MD     History of Present Illness:  Tracey Porter is a 84 y.o. female who was initially referred for cardiology evaluation following an emergency room visit where she was found to be in SVT. She presents now for a 3 month follow-up cardiology evaluation.  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  She has a history of hypertension, hyperlipidemia, and type 2 diabetes mellitus.  She had been taking metformin 500 mg daily with breakfast, leg, basal, alt to her milligrams twice a day, HCTZ 12.5 g daily, and atorvastatin 40 mg.  She has had issues with urinary urgency and was put on Mirabegron.  On 04/20/2016.  She also he went to [facility] with initial urinary complaints and was found to have SVT.  She was not seen by cardiologist.  However, Dr. Debara Pickett had spoken to the ER physician and it was recommended the addition of diltiazem LA 120 mg daily.  Patient was asymptomatic with reference to this tachycardia and a subsequent ECG showed restoration of normal sinus rhythm.  When I saw her, I further titrated diltiazem from 120 mg to 180 mg and recommended discontinuance of hydrochlorothiazide.  She underwent a 2-D echo Doppler study on 05/19/2016 which showed an EF of 65-70%.  She had normal wall motion.  There was grade 2 diastolic dysfunction.  There was mitral annular calcification with mild to moderate MR.  There was mild aortic valve stenosis with a peak gradient of 20 and mean gradient of 12 mmHg.  She had severe LA dilatation, moderate TR, and mild/moderate pulmonary hypertension with PA pressure 44 mm.  When  I saw her in December 2017, she was hypertensive and I further titrated Cardizem CD to 240 mg daily.  She has continued to take labetalol 200 mg  twice a day.  Does continue to take atorvastatin 40 mg for hyperlipidemia and is on metformin for type 2 diabetes mellitus.  On 10/19/2016 repeat laboratory revealed a total cholesterol of 191, triglycerides 83, HDL 88, and LDL cholesterol 86.  She was admitted to [facility] in July 2018 with acute onset hypertensive pulmonary edema, which improved with diuresis.  An echo, EF was reduced at 45-50% and there was a mild anteroseptal wall motion abnormality with moderate MR, mild-to-moderate TR, an estimated RV pressure at 48%.  Labetolol was further titrated.  She was referred for [facility] study which was done in August 2018, which was low risk.  An EF was 60% without ischemia.  Of note, the patient was in atrial fibrillation during the study.  Ultimately, she was started on eliquis and developed a GI bleed that sustained a fall leading to pelvic fracture.  She was seen in follow-up by me in October 2018.  Repeat echo Doppler study showed an EF of 65-70%.  She was  seen in November by Almyra Deforest, and she remained volume overloaded.  She was on torsemide and she was also found to have low magnesium level, which was replaced.  Apparently, she had been restarted back on eliquis and as tolerated this well without recurrent GI bleed.  She was last seen in our office on 07/28/2017 by Kerin Ransom and he decreased amiodarone from 200 mg twice a day to  20 mg daily.  Since she was maintaining sinus rhythm.  He discontinued diltiazem and at that time added low-dose Toprol 25 mg daily. She may ultimately require an epidural injection.  She has noted some mild dizziness.  She is unaware of recurrent atrial fibrillation.  She presents for evaluation  Past Medical History:  Diagnosis Date  . Diabetes mellitus without complication ([place])   . Hypertension     Past Surgical History:  Procedure Laterality Date  . ABDOMINAL HYSTERECTOMY  1976  . EYE SURGERY      Current Medications: Outpatient Medications Prior to  Visit  Medication Sig Dispense Refill  . acyclovir (ZOVIRAX) 400 MG tablet Take 800 mg by mouth 2 (two) times daily.    [name] amiodarone (PACERONE) 200 MG tablet Take 1 tablet (200 mg total) by mouth daily. 90 tablet 3  . apixaban (ELIQUIS) 5 MG TABS tablet Take 1 tablet (5 mg total) by mouth 2 (two) times daily. Please cancel previous rx's sent; call pt with price before filling 180 tablet 3  . atorvastatin (LIPITOR) 40 MG tablet Take 1 tablet (40 mg total) by mouth daily. 90 tablet 1  . Cholecalciferol (D-3-5) 5000 units capsule Take 5,000 Units by mouth daily.    [name] ibuprofen (ADVIL,MOTRIN) 600 MG tablet Take 1 tablet (600 mg total) by mouth every 6 (six) hours as needed. 30 tablet 0  . loperamide (IMODIUM) 2 MG capsule Take 1 capsule (2 mg total) by mouth as needed for diarrhea or loose stools. 30 capsule 0  . magnesium oxide (MAG-OX) 400 MG tablet Take 1 tablet (400 mg total) by mouth 2 (two) times daily. 60 tablet 1  . MYRBETRIQ 25 MG TB24 tablet Take 25 mg by mouth daily.    . nitroGLYCERIN (NITROSTAT) 0.4 MG SL tablet Place 0.4 mg under the tongue every 5 (five) minutes as needed for chest pain.    . pantoprazole (PROTONIX) 40 MG tablet Take 40 mg by mouth daily.    . prednisoLONE acetate (PRED FORTE) 1 % ophthalmic suspension Place 1 drop into the right eye 4 (four) times daily.     . Teriparatide, Recombinant, (FORTEO [place]) Inject 20 mcg daily into the skin.    [name] alendronate (FOSAMAX) 70 MG tablet Take 70 mg by mouth once a week.    . metoprolol succinate (TOPROL XL) 25 MG 24 hr tablet Take 1 tablet (25 mg total) by mouth daily. 30 tablet 6  . potassium chloride 20 MEQ TBCR Take 20 mEq by mouth daily. 90 tablet 3  . metFORMIN (GLUCOPHAGE-XR) 500 MG 24 hr tablet Take 500 mg by mouth every morning.     No facility-administered medications prior to visit.      Allergies:   Penicillins   Social History   Socioeconomic History  . Marital status: Widowed    Spouse name: None  . Number of  children: None  . Years of education: None  . Highest education level: None  Social Needs  . Financial resource strain: None  . Food insecurity - worry: None  . Food insecurity - inability: None  . Transportation needs - medical: None  . Transportation needs - non-medical: None  Occupational History  . None  Tobacco Use  . Smoking status: Former Research scientist (life sciences)  . Smokeless tobacco: Never Used  Substance and Sexual Activity  . Alcohol use: Yes  . Drug use: No  . Sexual activity: None  Other Topics Concern  . None  Social History Narrative  . None  Additional social history is notable that she is from [facility] in [place].  She is widowed for 19 years.  She is 4 children and 6 grandchildren.  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  She had remotely smoked cigarettes for 10 years but quit in 1970.  She usually has one drink per day of either wine or scotch.  She purchase patent water aerobics at least 3-4 days per week for an hour at a time.  Family History:  Her family history is notable in that her mother died at age 3 with liver problems and father died with lung cancer at 43.  She has one sister in her 49s.  Maternal grand mother died with a stroke.  Maternal grandfather died with typhoid fever.  She had 4 children, but unfortunately one died at age 38 in an accident.  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; No changes in hearing, sinus congestion, difficulty swallowing Pulmonary: Negative; No cough, wheezing, shortness of breath, hemoptysis Cardiovascular: See history of present illness GI: Positive for GI bleed GU: Positive for urinary frequency Musculoskeletal: Negative; no myalgias, joint pain, or weakness Hematologic/Oncology: Negative; no easy bruising, bleeding Endocrine: Positive for diabetes mellitus Neuro: Negative;  no changes in balance, headaches Skin: Negative; No rashes or skin lesions Psychiatric: Negative; No behavioral problems, depression Sleep: Negative; No snoring, daytime sleepiness, hypersomnolence, bruxism, restless legs, hypnogognic hallucinations, no cataplexy Other comprehensive 14 point system review is negative.   PHYSICAL EXAM:   VS:  BP (!) 80/52 (BP Location: Left Arm, Cuff Size: Normal)   Pulse 80   Resp 16   Ht 5' 5.25" (1.657 m)   Wt 143 lb (64.9 kg)   SpO2 94%   BMI 23.61 kg/m     Repeat blood pressure was 82/60  Wt Readings from Last 3 Encounters:  08/16/17 143 lb (64.9 kg)  07/28/17 157 lb (71.2 kg)  04/28/17 204 lb 6.4 oz (92.7 kg)      Physical Exam BP (!) 80/52 (BP Location: Left Arm, Cuff Size: Normal)   Pulse 80   Resp 16   Ht 5' 5.25" (1.657 m)   Wt 143 lb (64.9 kg)   SpO2 94%   BMI 23.61 kg/m  General: Alert, oriented, no distress.  Skin: normal turgor, no rashes, warm and dry HEENT: Normocephalic, atraumatic. Pupils equal round and reactive to light; sclera anicteric; extraocular muscles intact; she is status post right corneal transplant. Nose without nasal septal hypertrophy Mouth/Parynx benign; Mallinpatti scale3 Neck: No JVD, no carotid bruits; normal carotid upstroke Lungs: clear to ausculatation and percussion; no wheezing or rales Chest wall: without tenderness to palpitation Heart: PMI not displaced, RRR, s1 s2 normal, 1/6 systolic murmur, no diastolic murmur, no rubs, gallops, thrills, or heaves Abdomen: soft, nontender; no hepatosplenomehaly, BS+; abdominal aorta nontender and not dilated by palpation. Back: no CVA tenderness Pulses 2+ Musculoskeletal: full range of motion, normal strength, no joint deformities Extremities: no clubbing cyanosis or edema, Homan's sign negative  Neurologic: grossly nonfocal; Cranial nerves grossly wnl Psychologic: Normal mood and affect    Studies/Labs Reviewed:   ECG (independently read by me):  Normal sinus rhythm at 70 bpm.  QTc interval 453 ms.  PR interval 184 ms.  Q wave in lead 3.  No ST segment changes.  ECG (independently read by me): Sinus rhythm at 87 bpm.  Normal intervals.  No significant ST changes.  06/25/2016 ECG (independently read by me): Normal sinus rhythm at 92 bpm.  PR interval 190 ms.  QTc interval 427 ms.  04/30/2016 ECG (independently read by me): Normal sinus rhythm at 75 bpm.  I personally reviewed the 2 ECGs from her emergency evaluation on 04/20/2016.   Initial ECG showed SVT at 148 with a PR interval at 107 ms and QTc interval at 426 ms.  Subsequent ECG showed sinus tachycardia 109 bpm with a PR interval at 187 ms and QTc interval 419 ms.  Recent Labs: BMP Latest Ref Rng & Units 08/16/2017 05/12/2017 03/30/2017  Glucose 65 - 99 mg/dL 122(H) 101(H) 156(H)  BUN 8 - 27 mg/dL 17 9 22(H)  Creatinine 0.57 - 1.00 mg/dL 0.84 0.75 0.64  BUN/Creat Ratio 12 - '28 20 12 ' -  Sodium 134 - 144 mmol/L 136 142 138  Potassium 3.5 - 5.2 mmol/L 4.4 3.0(L) 4.1  Chloride 96 - 106 mmol/L 96 90(L) 104  CO2 20 - 29 mmol/L 20 32(H) 31  Calcium 8.7 - 10.3 mg/dL 9.2 7.6(L) 7.9(L)     Hepatic Function Latest Ref Rng & Units 08/16/2017 05/12/2017 03/25/2017  Total Protein 6.0 - 8.5 g/dL 6.2 6.6 5.7(L)  Albumin 3.5 - 4.7 g/dL 3.4(L) 3.7 2.7(L)  AST 0 - 40 IU/L 52(H) 23 21  ALT 0 - 32 IU/L 72(H) 19 23  Alk Phosphatase 39 - 117 IU/L 164(H) 134(H) 53  Total Bilirubin 0.0 - 1.2 mg/dL 0.5 0.7 1.7(H)  Bilirubin, Direct 0.1 - 0.5 mg/dL - - -    CBC Latest Ref Rng & Units 08/16/2017 05/12/2017 03/30/2017  WBC 3.4 - 10.8 x10E3/uL 11.3(H) 8.5 17.1(H)  Hemoglobin 11.1 - 15.9 g/dL 11.4 11.4 9.4(L)  Hematocrit 34.0 - 46.6 % 36.1 34.2 30.3(L)  Platelets 150 - 379 x10E3/uL 541(H) 474(H) 492(H)   Lab Results  Component Value Date   MCV 97 08/16/2017   MCV 97 05/12/2017   MCV 93.5 03/30/2017   Lab Results  Component Value Date   TSH 3.180 08/16/2017   Lab Results  Component Value Date     HGBA1C 6.2 (H) 03/20/2017     BNP    Component Value Date/Time   BNP 373.3 (H) 03/24/2017 1148    ProBNP No results found for: PROBNP   Lipid Panel  No results found for: CHOL, TRIG, HDL, CHOLHDL, VLDL, LDLCALC, LDLDIRECT   RADIOLOGY: No results found.   Additional studies/ records that were reviewed today include:  I personally reviewed her emergency room records from 04/20/2016, and repeat laboratory.    ASSESSMENT:    1. PAF (paroxysmal atrial fibrillation) ([place])   2. Chronic anticoagulation   3. Hypotension, unspecified hypotension type   4. Anemia, unspecified type   5. Multiple closed fractures of pelvis with unstable disruption of pelvic ring with delayed healing, subsequent encounter   6. Paroxysmal SVT (supraventricular tachycardia) [facility])      PLAN:  Ms. Nabi is an  84 year old female who has a history of hypertension, hyperlipidemia, and type 2 diabetes mellitus.  She went to the emergency room with urinary complaints and was found to have SVT for which she was completely asymptomatic.  During her SVT the PR interval was short.  A subsequent ECG with restoration of sinus rhythm showed PR interval at 187.  She was started on diltiazem CD 120 mg with subsequent dose titration.  She  developed paroxysmal atrial fibrillation for which she was started on eliquis anticoagulation  and developed initial GI blood loss and was found to have esophagitis on EGD.  She also sustained pelvic fractures following a fall.  Folllowingultimate stabilization after admission to [facility]  medications were decrease in amiodarone was added for rate control.  She has been on amiodarone 20 mg daily, Toprol-XL 25 mg daily.  Her ECG today shows sinus rhythm with rate at 70.  Blood pressure is low and I have reduced Toprol to 12.5 mg.  I have recommended increased hydration.  She is diabetic on metformin.  She continues to take eliquis 5 mg twice a day.  She has recently  sustained a new fracture at L2 and has compound subacute fractures at other sites.  She may require epidural injection due to chronic back pain.  If this is necessary she will need to hold eliquis for 4 days prior to the epidural procedure.   She is not having any chest pain. .  She denies dizziness with standing.  She return for follow-up evaluation with Kerin Ransom in 6 weeks and I will see her in 3 months for reevaluation.  Medication Adjustments/Labs and Tests Ordered: Current medicines are reviewed at length with the patient today.  Concerns regarding medicines are outlined above.  Medication changes, Labs and Tests ordered today are listed in the Patient Instructions below.  Patient Instructions  Medication Instructions:  STOP ibuprofen  DECREASE metoprolol succinate (Toprol XL) to 12.5 mg (1/2 tablet) at bedtime  Labwork: TODAY (CMET, CBC, TSH)  Follow-Up: 6 weeks with Kerin Ransom PA  Any Other Special Instructions Will Be Listed Below (If Applicable).     If you need a refill on your cardiac medications before your next appointment, please call your pharmacy.      Signed, Shelva Majestic, MD  08/22/2017 7:11 PM    [facility] [address], [place], [place], [place]  [address] Phone: [phone]

## 2017-08-16 NOTE — Patient Instructions (Signed)
Medication Instructions:  STOP ibuprofen  DECREASE metoprolol succinate (Toprol XL) to 12.5 mg (1/2 tablet) at bedtime  Labwork: TODAY (CMET, CBC, TSH)  Follow-Up: 6 weeks with Corine ShelterLuke Kilroy PA  Any Other Special Instructions Will Be Listed Below (If Applicable).     If you need a refill on your cardiac medications before your next appointment, please call your pharmacy.

## 2017-08-17 LAB — CBC
Hematocrit: 36.1 % (ref 34.0–46.6)
Hemoglobin: 11.4 g/dL (ref 11.1–15.9)
MCH: 30.5 pg (ref 26.6–33.0)
MCHC: 31.6 g/dL (ref 31.5–35.7)
MCV: 97 fL (ref 79–97)
PLATELETS: 541 10*3/uL — AB (ref 150–379)
RBC: 3.74 x10E6/uL — ABNORMAL LOW (ref 3.77–5.28)
RDW: 17.8 % — ABNORMAL HIGH (ref 12.3–15.4)
WBC: 11.3 10*3/uL — ABNORMAL HIGH (ref 3.4–10.8)

## 2017-08-17 LAB — COMPREHENSIVE METABOLIC PANEL
ALBUMIN: 3.4 g/dL — AB (ref 3.5–4.7)
ALT: 72 IU/L — ABNORMAL HIGH (ref 0–32)
AST: 52 IU/L — AB (ref 0–40)
Albumin/Globulin Ratio: 1.2 (ref 1.2–2.2)
Alkaline Phosphatase: 164 IU/L — ABNORMAL HIGH (ref 39–117)
BUN / CREAT RATIO: 20 (ref 12–28)
BUN: 17 mg/dL (ref 8–27)
Bilirubin Total: 0.5 mg/dL (ref 0.0–1.2)
CALCIUM: 9.2 mg/dL (ref 8.7–10.3)
CO2: 20 mmol/L (ref 20–29)
CREATININE: 0.84 mg/dL (ref 0.57–1.00)
Chloride: 96 mmol/L (ref 96–106)
GFR, EST AFRICAN AMERICAN: 73 mL/min/{1.73_m2} (ref >59)
GFR, EST NON AFRICAN AMERICAN: 64 mL/min/{1.73_m2} (ref >59)
Globulin, Total: 2.8 g/dL (ref 1.5–4.5)
Glucose: 122 mg/dL — ABNORMAL HIGH (ref 65–99)
Potassium: 4.4 mmol/L (ref 3.5–5.2)
SODIUM: 136 mmol/L (ref 134–144)
TOTAL PROTEIN: 6.2 g/dL (ref 6.0–8.5)

## 2017-08-17 LAB — TSH: TSH: 3.18 u[IU]/mL (ref 0.450–4.500)

## 2017-08-22 ENCOUNTER — Encounter: Payer: Self-pay | Admitting: Cardiovascular Disease

## 2017-09-13 ENCOUNTER — Other Ambulatory Visit: Payer: Self-pay | Admitting: *Deleted

## 2017-09-13 DIAGNOSIS — R7989 Other specified abnormal findings of blood chemistry: Secondary | ICD-10-CM

## 2017-09-13 DIAGNOSIS — Z79899 Other long term (current) drug therapy: Secondary | ICD-10-CM

## 2017-09-13 DIAGNOSIS — R945 Abnormal results of liver function studies: Principal | ICD-10-CM

## 2017-10-10 ENCOUNTER — Ambulatory Visit: Payer: Medicare Other | Admitting: Cardiology

## 2017-10-10 ENCOUNTER — Ambulatory Visit (INDEPENDENT_AMBULATORY_CARE_PROVIDER_SITE_OTHER): Payer: Medicare Other | Admitting: Cardiology

## 2017-10-10 ENCOUNTER — Encounter: Payer: Self-pay | Admitting: Cardiology

## 2017-10-10 DIAGNOSIS — G8929 Other chronic pain: Secondary | ICD-10-CM | POA: Diagnosis not present

## 2017-10-10 DIAGNOSIS — I10 Essential (primary) hypertension: Secondary | ICD-10-CM | POA: Diagnosis not present

## 2017-10-10 DIAGNOSIS — E785 Hyperlipidemia, unspecified: Secondary | ICD-10-CM | POA: Insufficient documentation

## 2017-10-10 DIAGNOSIS — I480 Paroxysmal atrial fibrillation: Secondary | ICD-10-CM | POA: Diagnosis not present

## 2017-10-10 DIAGNOSIS — Z7901 Long term (current) use of anticoagulants: Secondary | ICD-10-CM

## 2017-10-10 DIAGNOSIS — M549 Dorsalgia, unspecified: Secondary | ICD-10-CM

## 2017-10-10 NOTE — Assessment & Plan Note (Signed)
Labile B/P- now stable

## 2017-10-10 NOTE — Assessment & Plan Note (Signed)
The pt and her daughter indicate this has gotten better with treatment

## 2017-10-10 NOTE — Assessment & Plan Note (Signed)
CHA2DS2 VASc=6, she is on Eliquis

## 2017-10-10 NOTE — Assessment & Plan Note (Signed)
Elevated LFTs while on Amiodarone and Lipitor- this improved off Lipitor

## 2017-10-10 NOTE — Progress Notes (Signed)
10/10/2017 Tracey Porter   1930/02/13  [id number]  Primary Physician [place], Julie M, NP Primary Cardiologist: Dr Tresa Endo  HPI:  84 y/o female with a complicated medical history, see previous notes. She HTN, HLD, NIDDM, and chronic back pain from fractures. She had PAF noted in 2018 and was placed on Amiodarone and Eliquis. She has been hold NSR. Dr Tresa Endo saw her in Feb and her LFTs were elevated, her Lipitor was stopped. She has had issues with labile B/P and most recently she has been low. Dr Tresa Endo cut her Toprol back in Feb 2019 as well. She is a resident of [facility] in [place]. She is doing better with her back issues. Labs drawn at [facility] 10/04/17 show her LFTS have normalized-SGOT23/ SGPT 19. Her renal function is normal, and HGb A1c-5.5.  She is in the office with her daughter. The pt is in a wheelchair. She says she does do some pool exercises at her facility. Overall she is in good spirits and says she is doing pretty well.    Current Outpatient Medications  Medication Sig Dispense Refill  . acetaminophen (TYLENOL) 325 MG tablet Take 650 mg by mouth every 8 (eight) hours.    [name] acyclovir (ZOVIRAX) 400 MG tablet Take 800 mg by mouth 2 (two) times daily.    [name] amiodarone (PACERONE) 200 MG tablet Take 1 tablet (200 mg total) by mouth daily. 90 tablet 3  . apixaban (ELIQUIS) 5 MG TABS tablet Take 1 tablet (5 mg total) by mouth 2 (two) times daily. Please cancel previous rx's sent; call pt with price before filling 180 tablet 3  . Cholecalciferol (D-3-5) 5000 units capsule Take 5,000 Units by mouth daily.    [name] gabapentin (NEURONTIN) 300 MG capsule Take 300 mg by mouth 3 (three) times daily.    [name] GLUCOSAMINE-CHONDROIT-VIT C-MN PO Take 1 tablet by mouth daily.    [name] ibuprofen (ADVIL,MOTRIN) 600 MG tablet Take 1 tablet (600 mg total) by mouth every 6 (six) hours as needed. 30 tablet 0  . loperamide (IMODIUM) 2 MG capsule Take 1 capsule (2 mg total) by mouth as needed for diarrhea  or loose stools. 30 capsule 0  . magnesium chloride (SLOW-MAG) 64 MG TBEC SR tablet Take 1 tablet by mouth 2 (two) times daily.    . metFORMIN (GLUCOPHAGE-XR) 500 MG 24 hr tablet Take 500 mg by mouth every morning.    . metoprolol succinate (TOPROL XL) 25 MG 24 hr tablet Take 0.5 tablets (12.5 mg total) by mouth at bedtime. 30 tablet 6  . Multiple Vitamins-Minerals (ICAPS PLUS) TABS Take 1 tablet by mouth 2 (two) times daily.    [name] MYRBETRIQ 25 MG TB24 tablet Take 25 mg by mouth daily.    . nitroGLYCERIN (NITROSTAT) 0.4 MG SL tablet Place 0.4 mg under the tongue every 5 (five) minutes as needed for chest pain.    . pantoprazole (PROTONIX) 40 MG tablet Take 40 mg by mouth daily.    Bertram Gala Glycol-Propyl Glycol (SYSTANE) 0.4-0.3 % SOLN Apply 1 drop to eye 2 (two) times daily.    . prednisoLONE acetate (PRED FORTE) 1 % ophthalmic suspension Place 1 drop into the right eye 4 (four) times daily.     . Teriparatide, Recombinant, (FORTEO [place]) Inject 20 mcg daily into the skin.    [name] timolol (BETIMOL) 0.5 % ophthalmic solution Place 1 drop into both eyes 2 (two) times daily.    . vitamin B-12 (CYANOCOBALAMIN) 1000 MCG tablet Take 1,000  mcg by mouth daily.    . vitamin C (ASCORBIC ACID) 500 MG tablet Take 500 mg by mouth 2 (two) times daily.     No current facility-administered medications for this visit.     Allergies  Allergen Reactions  . Penicillins Rash    Past Medical History:  Diagnosis Date  . Diabetes mellitus without complication (HCC)   . Hypertension     Social History   Socioeconomic History  . Marital status: Widowed    Spouse name: Not on file  . Number of children: Not on file  . Years of education: Not on file  . Highest education level: Not on file  Occupational History  . Not on file  Social Needs  . Financial resource strain: Not on file  . Food insecurity:    Worry: Not on file    Inability: Not on file  . Transportation needs:    Medical: Not on file     Non-medical: Not on file  Tobacco Use  . Smoking status: Former Games developermoker  . Smokeless tobacco: Never Used  Substance and Sexual Activity  . Alcohol use: Yes  . Drug use: No  . Sexual activity: Not on file  Lifestyle  . Physical activity:    Days per week: Not on file    Minutes per session: Not on file  . Stress: Not on file  Relationships  . Social connections:    Talks on phone: Not on file    Gets together: Not on file    Attends religious service: Not on file    Active member of club or organization: Not on file    Attends meetings of clubs or organizations: Not on file    Relationship status: Not on file  . Intimate partner violence:    Fear of current or ex partner: Not on file    Emotionally abused: Not on file    Physically abused: Not on file    Forced sexual activity: Not on file  Other Topics Concern  . Not on file  Social History Narrative  . Not on file     Family History  Problem Relation Age of Onset  . Lung cancer Father   . Lymphoma Daughter      Review of Systems: General: negative for chills, fever, night sweats or weight changes.  Cardiovascular: negative for chest pain, dyspnea on exertion, edema, orthopnea, palpitations, paroxysmal nocturnal dyspnea or shortness of breath Dermatological: negative for rash Respiratory: negative for cough or wheezing Urologic: negative for hematuria Abdominal: negative for nausea, vomiting, diarrhea, bright red blood per rectum, melena, or hematemesis Neurologic: negative for visual changes, syncope, or dizziness All other systems reviewed and are otherwise negative except as noted above.    Blood pressure 104/64, pulse 74, height 5\' 6"  (1.676 m), weight 138 lb (62.6 kg), SpO2 96 %.  General appearance: alert, cooperative, no distress and in wheel chair Neck: no carotid bruit and no JVD Lungs: clear to auscultation bilaterally Heart: regular rate and rhythm Extremities: no edema Skin: Skin color, texture,  turgor normal. No rashes or lesions Neurologic: Grossly normal   ASSESSMENT AND PLAN:   PAF (paroxysmal atrial fibrillation) (HCC) NSR today on exam- continue Amiodarone 200 mg daily and Toprol 12.5 mg daily  Chronic anticoagulation CHA2DS2 VASc=6, she is on Eliquis  Essential hypertension Labile B/P- now stable  Chronic back pain The pt and her daughter indicate this has gotten better with treatment  Dyslipidemia Elevated LFTs while on Amiodarone and  Lipitor- this improved off Lipitor   PLAN  I will leave her off Lipitor for now- f/u Dr Tresa Endo in 6 months.   Corine Shelter PA-C 10/10/2017 11:37 AM

## 2017-10-10 NOTE — Patient Instructions (Signed)
Medication Instructions:  Your physician recommends that you continue on your current medications as directed. Please refer to the Current Medication list given to you today.  Follow-Up: Your physician wants you to follow-up in: 6 months with Dr. Kelly.  You will receive a reminder letter in the mail two months in advance. If you don't receive a letter, please call our office to schedule the follow-up appointment.      If you need a refill on your cardiac medications before your next appointment, please call your pharmacy.   

## 2017-10-10 NOTE — Assessment & Plan Note (Signed)
NSR today on exam- continue Amiodarone 200 mg daily and Toprol 12.5 mg daily

## 2017-12-12 ENCOUNTER — Telehealth: Payer: Self-pay | Admitting: Cardiovascular Disease

## 2017-12-12 NOTE — Telephone Encounter (Signed)
New Message:   Daughter called and said pt was seen by her primary doctor,Dr Florentina JennyHenry Tripp today. Dr Redmond Schoolripp detected a heart murmur. Daughter wants to know if pt should make an appointment to see Dr Tresa EndoKelly?

## 2017-12-12 NOTE — Telephone Encounter (Signed)
Spoke with pt's daughter who states her mother was seen by her pcp and told he heard a murmur. She denies any symptoms and states her mother feels fine. Daughter calling to see if Dr. Tresa EndoKelly feels she needs to be seen for further evaluations.

## 2017-12-16 NOTE — Telephone Encounter (Signed)
Spoke with daughter and advised that per Dr. Tresa EndoKelly it was noted on last ECHO. Daughter verbalized understanding. Pt's current address also updated.

## 2017-12-16 NOTE — Telephone Encounter (Signed)
She has MR and TR noted on prior echoes

## 2017-12-16 NOTE — Telephone Encounter (Signed)
Left message to call back  

## 2018-02-22 ENCOUNTER — Encounter (HOSPITAL_COMMUNITY): Payer: Self-pay | Admitting: Emergency Medicine

## 2018-02-22 ENCOUNTER — Other Ambulatory Visit: Payer: Self-pay

## 2018-02-22 ENCOUNTER — Emergency Department (HOSPITAL_COMMUNITY)
Admission: EM | Admit: 2018-02-22 | Discharge: 2018-02-22 | Disposition: A | Discharge status: Home / Self Care | Payer: Medicare Other | Attending: Emergency Medicine | Admitting: Emergency Medicine

## 2018-02-22 DIAGNOSIS — R350 Frequency of micturition: Secondary | ICD-10-CM

## 2018-02-22 DIAGNOSIS — I110 Hypertensive heart disease with heart failure: Secondary | ICD-10-CM | POA: Diagnosis not present

## 2018-02-22 DIAGNOSIS — Z79899 Other long term (current) drug therapy: Secondary | ICD-10-CM | POA: Diagnosis not present

## 2018-02-22 DIAGNOSIS — E119 Type 2 diabetes mellitus without complications: Secondary | ICD-10-CM | POA: Insufficient documentation

## 2018-02-22 DIAGNOSIS — Z87891 Personal history of nicotine dependence: Secondary | ICD-10-CM | POA: Diagnosis not present

## 2018-02-22 DIAGNOSIS — Z7984 Long term (current) use of oral hypoglycemic drugs: Secondary | ICD-10-CM | POA: Diagnosis not present

## 2018-02-22 DIAGNOSIS — Z7901 Long term (current) use of anticoagulants: Secondary | ICD-10-CM | POA: Insufficient documentation

## 2018-02-22 DIAGNOSIS — R3915 Urgency of urination: Secondary | ICD-10-CM | POA: Diagnosis present

## 2018-02-22 DIAGNOSIS — I5032 Chronic diastolic (congestive) heart failure: Secondary | ICD-10-CM | POA: Diagnosis not present

## 2018-02-22 LAB — URINALYSIS, ROUTINE W REFLEX MICROSCOPIC
Bacteria, UA: NONE SEEN
Bilirubin Urine: NEGATIVE
Glucose, UA: NEGATIVE mg/dL
Hgb urine dipstick: NEGATIVE
Ketones, ur: NEGATIVE mg/dL
Nitrite: NEGATIVE
Protein, ur: NEGATIVE mg/dL
Specific Gravity, Urine: 1.011 (ref 1.005–1.030)
pH: 6 (ref 5.0–8.0)

## 2018-02-22 MED ORDER — CEPHALEXIN 250 MG PO CAPS
500.0000 mg | ORAL_CAPSULE | Freq: Once | ORAL | Status: AC
  Administered 2018-02-22: 500 mg via ORAL
  Filled 2018-02-22: qty 2

## 2018-02-22 MED ORDER — CEPHALEXIN 500 MG PO CAPS: 500.0000 mg | ORAL_CAPSULE | Freq: Three times a day (TID) | ORAL | 0 refills | Status: DC

## 2018-02-22 NOTE — ED Notes (Signed)
UA and culture sent to lab

## 2018-02-22 NOTE — ED Provider Notes (Signed)
Patient placed in Quick Look pathway, seen and evaluated   Chief Complaint: UTI  HPI:   Tracey Porter is a 84 y.o. female who present to the ED with urinary urgency that started this morning. Patient denies n/v, fever chills or hematuria.   ROS: GU; urgency  Physical Exam:  BP (!) 187/87 (BP Location: Left Arm)   Pulse 84   Temp 99 F (37.2 C) (Oral)   Resp 18   Ht 5\' 6"  (1.676 m)   Wt 68 kg   SpO2 97%   BMI 24.21 kg/m    Gen: No distress  Neuro: Awake and Alert  Skin: Warm and dry  Abdomen: Soft, non tender  Initiation of care has begun. The patient has been counseled on the process, plan, and necessity for staying for the completion/evaluation, and the remainder of the medical screening examination    Janne Napoleoneese, Hope M, NP 02/22/18 1903    Gerhard MunchLockwood, Robert, MD 02/22/18 [phone]

## 2018-02-22 NOTE — ED Provider Notes (Signed)
  Face-to-face evaluation   History: She presents for evaluation of urinary frequency.  Physical exam: Alert elderly female who is calm and comfortable.  Abdomen soft nontender.  No dysarthria or aphasia.  Medical screening examination/treatment/procedure(s) were conducted as a shared visit with non-physician practitioner(s) and myself.  I personally evaluated the patient during the encounter   Mancel BaleWentz, Melvern Ramone, MD 02/22/18 2334

## 2018-02-22 NOTE — ED Notes (Signed)
Pt attempts to provide UA sample. Pt misses target. Pt given 8oz water.

## 2018-02-22 NOTE — ED Triage Notes (Signed)
Pt reports urinary urgency that started this morning. Denies N/V and hematuria. Denies pain. Hx of HTN, compliant with medications.

## 2018-02-22 NOTE — ED Notes (Signed)
Pt reports new onset urinary urgency. Pt denies any blood, pain, or burning during urination.

## 2018-02-22 NOTE — ED Provider Notes (Signed)
[facility] EMERGENCY DEPARTMENT Provider Note   CSN: [id number] Arrival date & time: 02/22/18  1837     History   Chief Complaint Chief Complaint  Patient presents with  . Urinary Tract Infection    HPI Tracey Porter is a 84 y.o. female.  The history is provided by the patient. No language interpreter was used.  Urinary Tract Infection   This is a new problem. The problem occurs intermittently. The problem has not changed since onset.She is not sexually active. There is no history of pyelonephritis. She has tried nothing for the symptoms.  Pt reports she as increased urinary urgency.  Pt reports it started today.  Pt has had uti's in the past.  Pt has high blood pressure.  Pt has not had her blood pressure medication tonight.  Daughter reports she will give her her evening medication.   Past Medical History:  Diagnosis Date  . Diabetes mellitus without complication (HCC)   . Hypertension     Patient Active Problem List   Diagnosis Date Noted  . Chronic back pain 10/10/2017  . Dyslipidemia 10/10/2017  . PAF (paroxysmal atrial fibrillation) (HCC) 07/28/2017  . Orthostatic hypotension 07/28/2017  . History of GI bleed 07/28/2017  . Chronic diastolic heart failure (HCC)   . Multiple fractures of pelvis with unstable disruption of pelvic ring, initial encounter for closed fracture (HCC) 03/20/2017  . Atrial fibrillation with RVR (HCC) 03/20/2017  . Aortic atherosclerosis (HCC) 03/20/2017  . Hypotension due to blood loss   . Chronic anticoagulation   . Closed pelvic fracture (HCC) 03/19/2017  . Leukocytosis 03/19/2017  . Fall at home, initial encounter 03/19/2017  . Paroxysmal SVT (supraventricular tachycardia) (HCC)   . Essential hypertension   . Non-insulin treated type 2 diabetes mellitus (HCC) 03/20/2015  . Hypokalemia 03/19/2015    Past Surgical History:  Procedure Laterality Date  . ABDOMINAL HYSTERECTOMY  1976  . EYE SURGERY       OB  History   None      Home Medications    Prior to Admission medications   Medication Sig Start Date End Date Taking? Authorizing Provider  acetaminophen (TYLENOL) 325 MG tablet Take 650 mg by mouth every 8 (eight) hours.    [provider]  acyclovir (ZOVIRAX) 400 MG tablet Take 800 mg by mouth 2 (two) times daily. 03/04/17   [provider]  amiodarone (PACERONE) 200 MG tablet Take 1 tablet (200 mg total) by mouth daily. 07/28/17   Abelino DerrickKilroy, Luke K, PA-C  apixaban (ELIQUIS) 5 MG TABS tablet Take 1 tablet (5 mg total) by mouth 2 (two) times daily. Please cancel previous rx's sent; call pt with price before filling 02/24/17   Azalee CourseMeng, Hao, GeorgiaPA  Cholecalciferol (D-3-5) 5000 units capsule Take 5,000 Units by mouth daily.    [provider]  gabapentin (NEURONTIN) 300 MG capsule Take 300 mg by mouth 3 (three) times daily.    [provider]  GLUCOSAMINE-CHONDROIT-VIT C-MN PO Take 1 tablet by mouth daily.    [provider]  ibuprofen (ADVIL,MOTRIN) 600 MG tablet Take 1 tablet (600 mg total) by mouth every 6 (six) hours as needed. 03/31/17   Tyrone NineGrunz, Ryan B, MD  loperamide (IMODIUM) 2 MG capsule Take 1 capsule (2 mg total) by mouth as needed for diarrhea or loose stools. 03/31/17   Tyrone NineGrunz, Ryan B, MD  magnesium chloride (SLOW-MAG) 64 MG TBEC SR tablet Take 1 tablet by mouth 2 (two) times daily.  [provider]  metFORMIN (GLUCOPHAGE-XR) 500 MG 24 hr tablet Take 500 mg by mouth every morning. 03/04/17   [provider]  metoprolol succinate (TOPROL XL) 25 MG 24 hr tablet Take 0.5 tablets (12.5 mg total) by mouth at bedtime. 08/16/17   Lennette Bihari, MD  Multiple Vitamins-Minerals (ICAPS PLUS) TABS Take 1 tablet by mouth 2 (two) times daily.    [provider]  MYRBETRIQ 25 MG TB24 tablet Take 25 mg by mouth daily. 03/04/17   [provider]  nitroGLYCERIN (NITROSTAT) 0.4 MG SL tablet Place 0.4 mg under the tongue every 5 (five)  minutes as needed for chest pain.    [provider]  pantoprazole (PROTONIX) 40 MG tablet Take 40 mg by mouth daily.    [provider]  Polyethyl Glycol-Propyl Glycol (SYSTANE) 0.4-0.3 % SOLN Apply 1 drop to eye 2 (two) times daily.    [provider]  prednisoLONE acetate (PRED FORTE) 1 % ophthalmic suspension Place 1 drop into the right eye 4 (four) times daily.  03/19/17   [provider]  Teriparatide, Recombinant, (FORTEO [place]) Inject 20 mcg daily into the skin.    [provider]  timolol (BETIMOL) 0.5 % ophthalmic solution Place 1 drop into both eyes 2 (two) times daily.    [provider]  vitamin B-12 (CYANOCOBALAMIN) 1000 MCG tablet Take 1,000 mcg by mouth daily.    [provider]  vitamin C (ASCORBIC ACID) 500 MG tablet Take 500 mg by mouth 2 (two) times daily.    [provider]    Family History Family History  Problem Relation Age of Onset  . Lung cancer Father   . Lymphoma Daughter     Social History Social History   Tobacco Use  . Smoking status: Former Games developer  . Smokeless tobacco: Never Used  Substance Use Topics  . Alcohol use: Yes  . Drug use: No     Allergies   Penicillins   Review of Systems Review of Systems  All other systems reviewed and are negative.    Physical Exam Updated Vital Signs BP (!) 199/87   Pulse 84   Temp 99 F (37.2 C) (Oral)   Resp (!) 26   Ht 5\' 6"  (1.676 m)   Wt 68 kg   SpO2 98%   BMI 24.21 kg/m   Physical Exam  Constitutional: She is oriented to person, place, and time. She appears well-developed and well-nourished.  HENT:  Head: Normocephalic.  Right Ear: External ear normal.  Left Ear: External ear normal.  Eyes: Pupils are equal, round, and reactive to light. EOM are normal.  Neck: Normal range of motion.  Cardiovascular: Normal rate and regular rhythm.  Pulmonary/Chest: Effort normal.  Abdominal: She exhibits no distension and no mass.  There is no tenderness. There is no guarding.  Musculoskeletal: Normal range of motion.  Neurological: She is alert and oriented to person, place, and time.  Skin: Skin is warm.  Psychiatric: She has a normal mood and affect.  Nursing note and vitals reviewed.    ED Treatments / Results  Labs (all labs ordered are listed, but only abnormal results are displayed) Labs Reviewed  URINALYSIS, ROUTINE W REFLEX MICROSCOPIC - Abnormal; Notable for the following components:      Result Value   Leukocytes, UA SMALL (*)    All other components within normal limits    EKG None  Radiology No results found.  Procedures Procedures (including critical care time)  Medications Ordered in ED Medications - No data to display   Initial Impression / Assessment and Plan / ED Course  I have reviewed the triage vital signs and the nursing notes.  Pertinent labs & imaging results that were available during my care of the patient were reviewed by me and considered in my medical decision making (see chart for details).    MDM  ua shows 6-10 wbc's  Possible early uti.   I will culture urine  And treat with keflex.  Pt is advised to see Dr. Redmond Schooltripp for recheck    Final Clinical Impressions(s) / ED Diagnoses   Final diagnoses:  Urinary frequency    ED Discharge Orders         Ordered    cephALEXin (KEFLEX) 500 MG capsule  3 times daily     02/22/18 2231        An After Visit Summary was printed and given to the patient.   Osie CheeksSofia, Christie Copley K, PA-C 02/22/18 2232    Mancel BaleWentz, Elliott, MD 02/22/18 [phone]

## 2018-02-24 LAB — URINE CULTURE: Culture: NO GROWTH

## 2018-03-02 ENCOUNTER — Other Ambulatory Visit: Payer: Self-pay | Admitting: Cardiovascular Disease

## 2018-04-30 ENCOUNTER — Emergency Department (HOSPITAL_COMMUNITY): Payer: Medicare Other

## 2018-04-30 ENCOUNTER — Other Ambulatory Visit: Payer: Self-pay

## 2018-04-30 ENCOUNTER — Inpatient Hospital Stay (HOSPITAL_COMMUNITY)
Admission: EM | Admit: 2018-04-30 | Discharge: 2018-05-05 | DRG: 470 | Disposition: A | Discharge status: Skilled Nursing Facility | Payer: Medicare Other | Source: Skilled Nursing Facility | Attending: Internal Medicine | Admitting: Internal Medicine

## 2018-04-30 ENCOUNTER — Encounter (HOSPITAL_COMMUNITY): Payer: Self-pay | Admitting: Emergency Medicine

## 2018-04-30 DIAGNOSIS — Z87891 Personal history of nicotine dependence: Secondary | ICD-10-CM

## 2018-04-30 DIAGNOSIS — S72001K Fracture of unspecified part of neck of right femur, subsequent encounter for closed fracture with nonunion: Secondary | ICD-10-CM | POA: Diagnosis not present

## 2018-04-30 DIAGNOSIS — Z90710 Acquired absence of both cervix and uterus: Secondary | ICD-10-CM

## 2018-04-30 DIAGNOSIS — S72009A Fracture of unspecified part of neck of unspecified femur, initial encounter for closed fracture: Secondary | ICD-10-CM

## 2018-04-30 DIAGNOSIS — S7290XA Unspecified fracture of unspecified femur, initial encounter for closed fracture: Secondary | ICD-10-CM

## 2018-04-30 DIAGNOSIS — Z7901 Long term (current) use of anticoagulants: Secondary | ICD-10-CM | POA: Diagnosis not present

## 2018-04-30 DIAGNOSIS — I471 Supraventricular tachycardia, unspecified: Secondary | ICD-10-CM | POA: Diagnosis present

## 2018-04-30 DIAGNOSIS — I5032 Chronic diastolic (congestive) heart failure: Secondary | ICD-10-CM | POA: Diagnosis not present

## 2018-04-30 DIAGNOSIS — S72011A Unspecified intracapsular fracture of right femur, initial encounter for closed fracture: Principal | ICD-10-CM | POA: Diagnosis present

## 2018-04-30 DIAGNOSIS — I10 Essential (primary) hypertension: Secondary | ICD-10-CM | POA: Diagnosis not present

## 2018-04-30 DIAGNOSIS — Z79899 Other long term (current) drug therapy: Secondary | ICD-10-CM

## 2018-04-30 DIAGNOSIS — E119 Type 2 diabetes mellitus without complications: Secondary | ICD-10-CM | POA: Diagnosis not present

## 2018-04-30 DIAGNOSIS — I480 Paroxysmal atrial fibrillation: Secondary | ICD-10-CM | POA: Diagnosis present

## 2018-04-30 DIAGNOSIS — Y92009 Unspecified place in unspecified non-institutional (private) residence as the place of occurrence of the external cause: Secondary | ICD-10-CM | POA: Diagnosis not present

## 2018-04-30 DIAGNOSIS — B349 Viral infection, unspecified: Secondary | ICD-10-CM | POA: Diagnosis present

## 2018-04-30 DIAGNOSIS — I110 Hypertensive heart disease with heart failure: Secondary | ICD-10-CM | POA: Diagnosis present

## 2018-04-30 DIAGNOSIS — D62 Acute posthemorrhagic anemia: Secondary | ICD-10-CM | POA: Diagnosis not present

## 2018-04-30 DIAGNOSIS — K219 Gastro-esophageal reflux disease without esophagitis: Secondary | ICD-10-CM | POA: Diagnosis present

## 2018-04-30 DIAGNOSIS — S72001A Fracture of unspecified part of neck of right femur, initial encounter for closed fracture: Secondary | ICD-10-CM | POA: Diagnosis present

## 2018-04-30 DIAGNOSIS — E785 Hyperlipidemia, unspecified: Secondary | ICD-10-CM | POA: Diagnosis present

## 2018-04-30 DIAGNOSIS — W07XXXA Fall from chair, initial encounter: Secondary | ICD-10-CM | POA: Diagnosis present

## 2018-04-30 LAB — CBC WITH DIFFERENTIAL/PLATELET
ABS IMMATURE GRANULOCYTES: 0.09 10*3/uL — AB (ref 0.00–0.07)
BASOS ABS: 0 10*3/uL (ref 0.0–0.1)
BASOS PCT: 0 %
Eosinophils Absolute: 0 10*3/uL (ref 0.0–0.5)
Eosinophils Relative: 0 %
HEMATOCRIT: 40.4 % (ref 36.0–46.0)
Hemoglobin: 12.8 g/dL (ref 12.0–15.0)
IMMATURE GRANULOCYTES: 1 %
LYMPHS ABS: 0.7 10*3/uL (ref 0.7–4.0)
Lymphocytes Relative: 8 %
MCH: 31.8 pg (ref 26.0–34.0)
MCHC: 31.7 g/dL (ref 30.0–36.0)
MCV: 100.5 fL — AB (ref 80.0–100.0)
MONOS PCT: 7 %
Monocytes Absolute: 0.6 10*3/uL (ref 0.1–1.0)
NEUTROS ABS: 8.1 10*3/uL — AB (ref 1.7–7.7)
NRBC: 0 % (ref 0.0–0.2)
Neutrophils Relative %: 84 %
PLATELETS: 272 10*3/uL (ref 150–400)
RBC: 4.02 MIL/uL (ref 3.87–5.11)
RDW: 13.8 % (ref 11.5–15.5)
WBC: 9.6 10*3/uL (ref 4.0–10.5)

## 2018-04-30 LAB — BASIC METABOLIC PANEL
ANION GAP: 10 (ref 5–15)
BUN: 21 mg/dL (ref 8–23)
CALCIUM: 9.3 mg/dL (ref 8.9–10.3)
CO2: 26 mmol/L (ref 22–32)
Chloride: 101 mmol/L (ref 98–111)
Creatinine, Ser: 0.65 mg/dL (ref 0.44–1.00)
GFR calc non Af Amer: >60 mL/min (ref >60)
GLUCOSE: 103 mg/dL — AB (ref 70–99)
POTASSIUM: 4.2 mmol/L (ref 3.5–5.1)
Sodium: 137 mmol/L (ref 135–145)

## 2018-04-30 LAB — PROTIME-INR
INR: 1.2
Prothrombin Time: 15.1 seconds (ref 11.4–15.2)

## 2018-04-30 LAB — SURGICAL PCR SCREEN
MRSA, PCR: NEGATIVE
Staphylococcus aureus: NEGATIVE

## 2018-04-30 LAB — TYPE AND SCREEN
ABO/RH(D): A POS
ABO/RH(D): A POS
Antibody Screen: NEGATIVE
Antibody Screen: NEGATIVE

## 2018-04-30 LAB — GLUCOSE, CAPILLARY: Glucose-Capillary: 120 mg/dL — ABNORMAL HIGH (ref 70–99)

## 2018-04-30 IMAGING — CR DG HIP (WITH OR WITHOUT PELVIS) 2-3V*R*
3 series · 3 of 3 positions shown · non-contrast
Comparison: None.

CLINICAL DATA: Slid out of chair. Fall. Right hip pain. Deformity
to the right lower extremity.

EXAM:
DG HIP (WITH OR WITHOUT PELVIS) 2-3V RIGHT

[t pelvis ap]
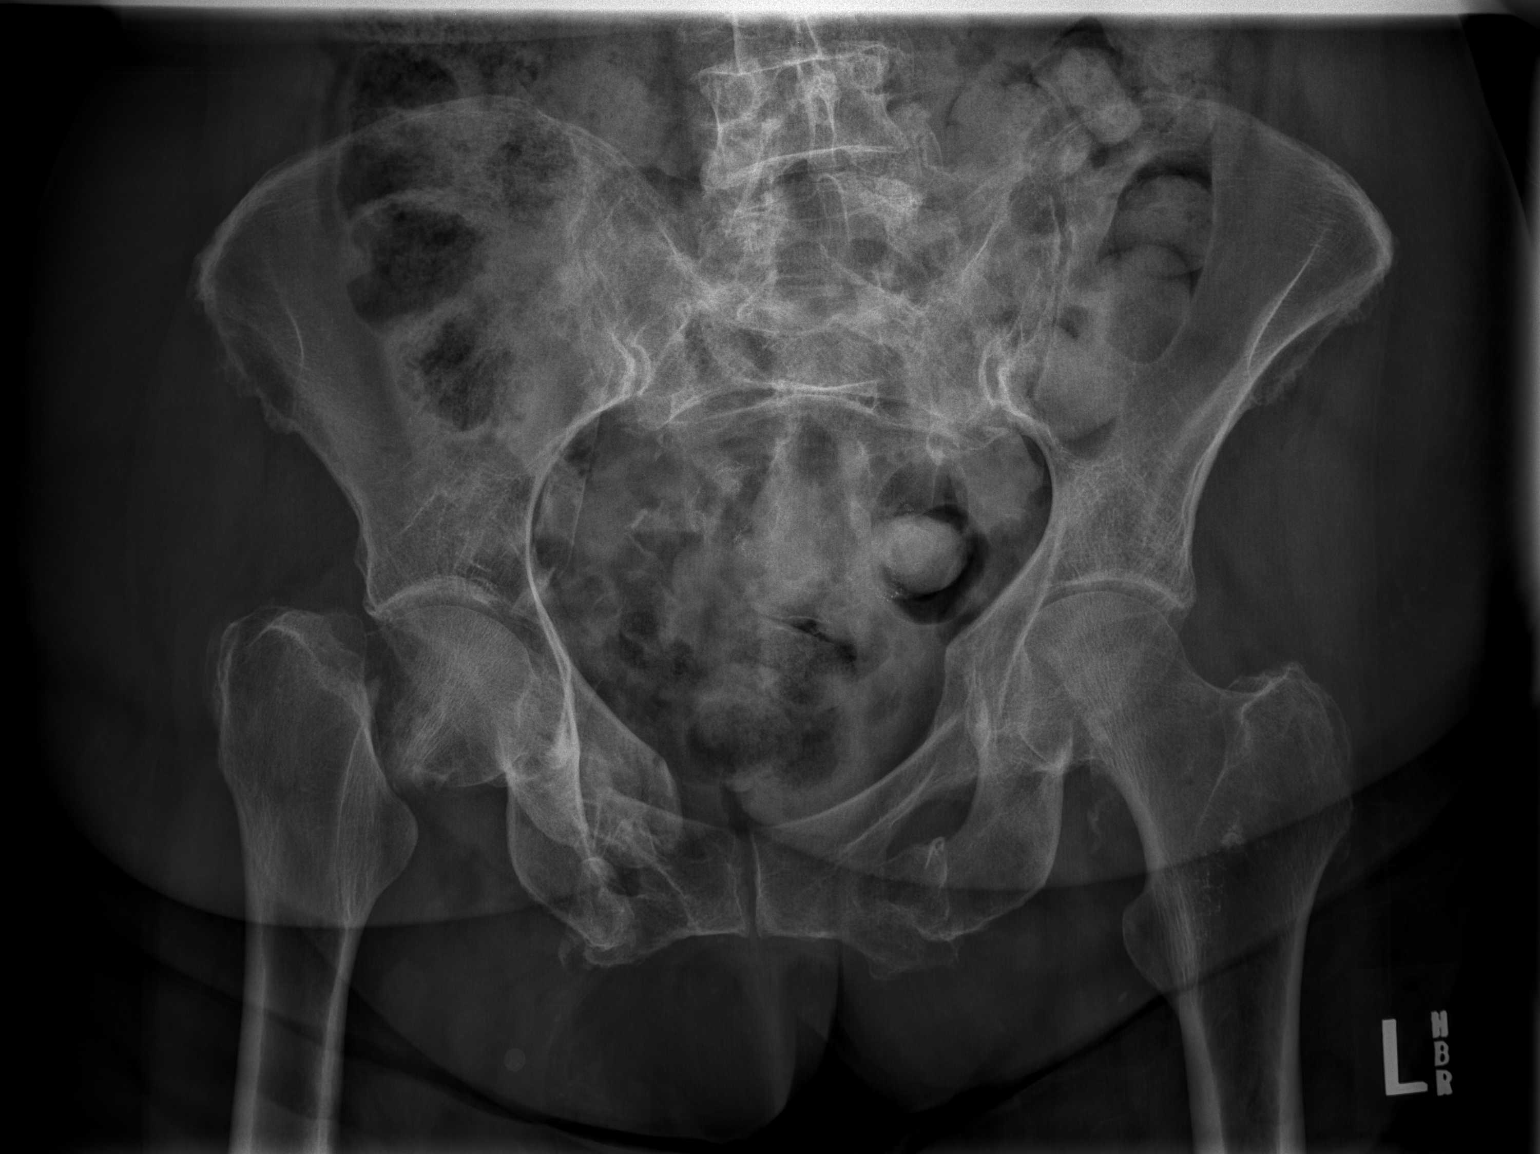

[t hip ap right]
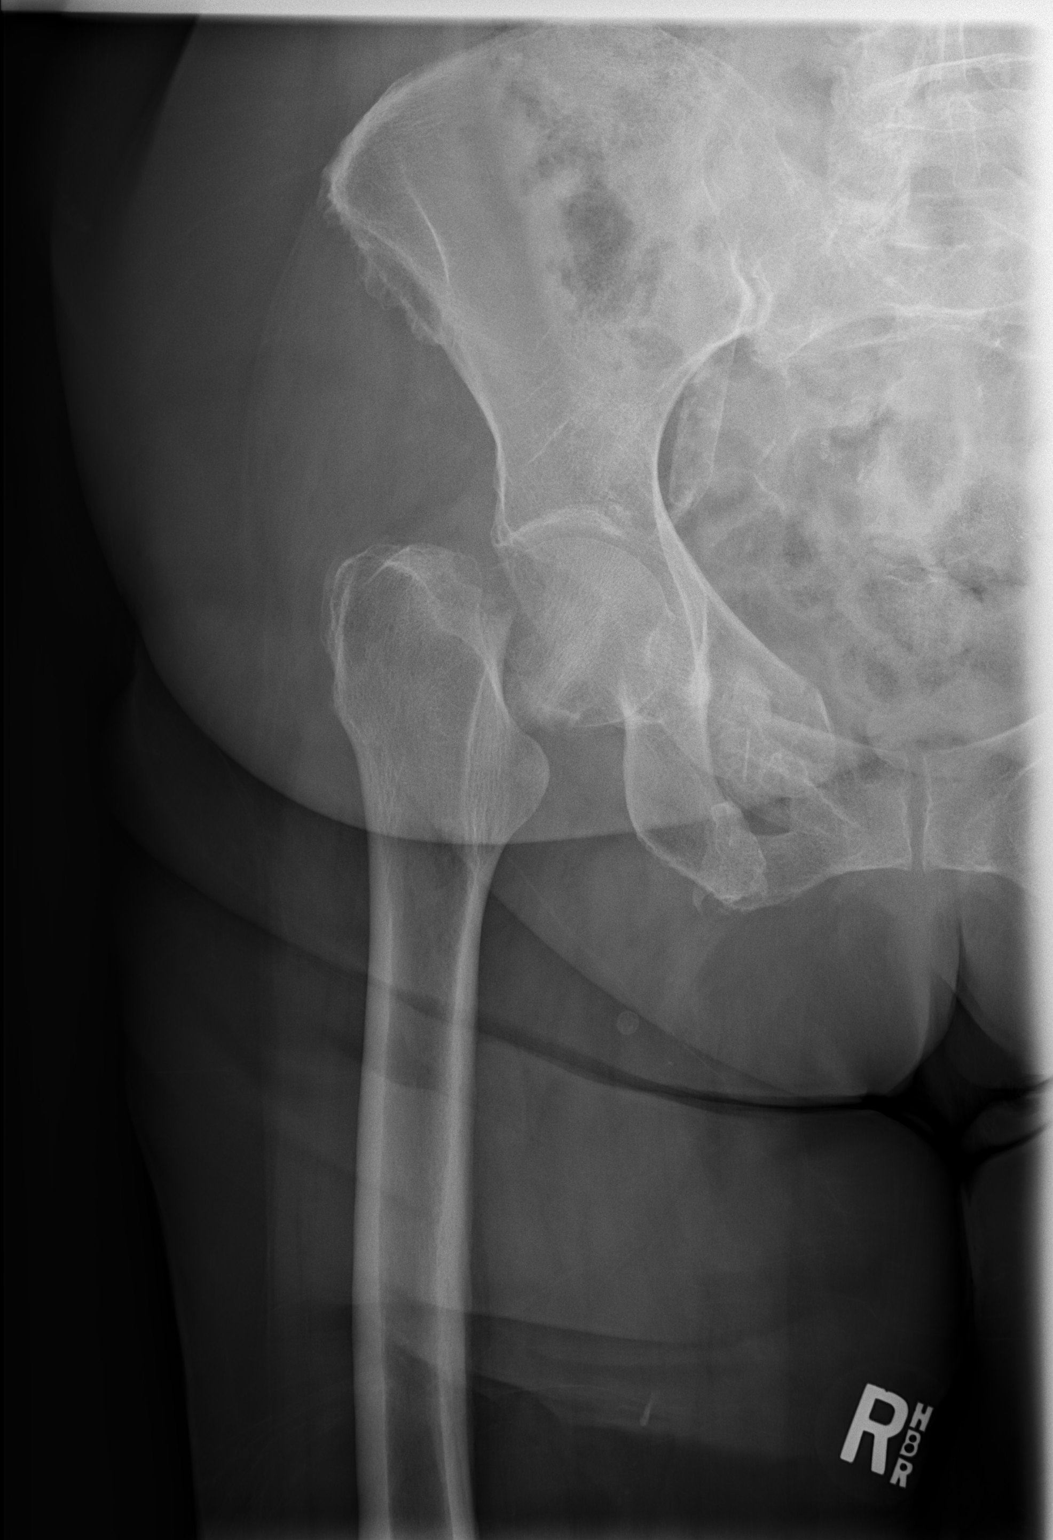

[w hip lat right]
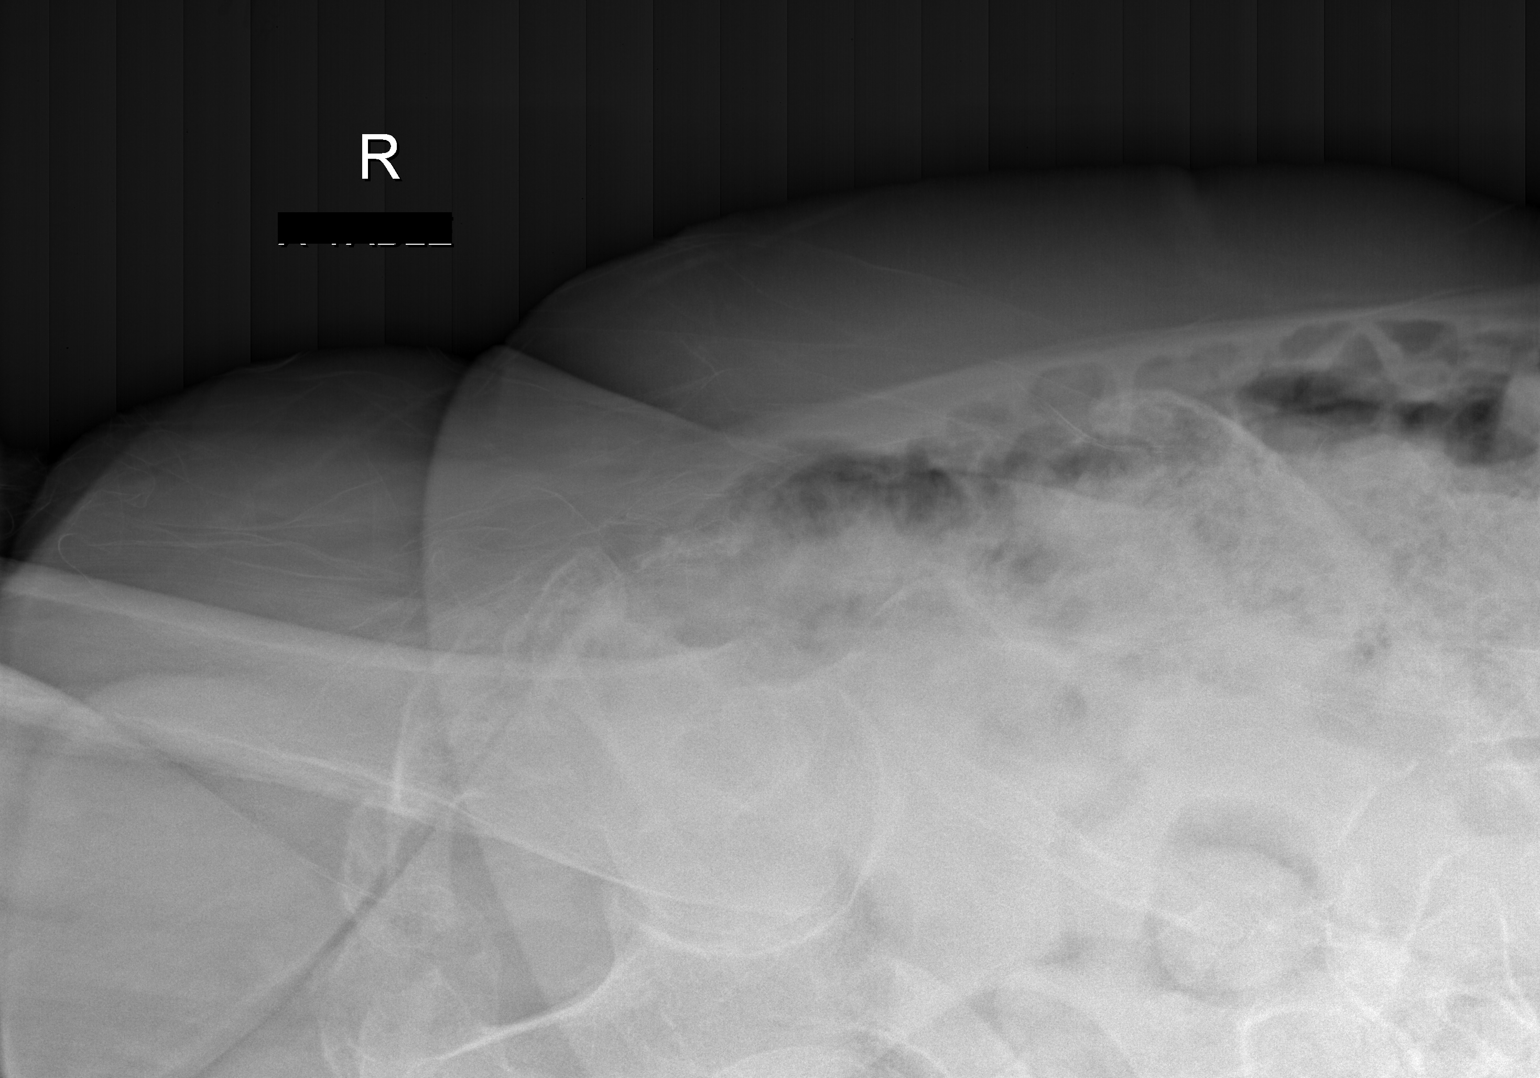

[3 of 3 positions shown; findings below may reference images not displayed]

FINDINGS: A right subcapital femoral neck fracture is displaced superiorly
with varus angulation. Bilateral superior and inferior pubic rami
fractures appear remote. Moderate osteopenia is present. No
additional fractures are present.
IMPRESSION: 1. Acute right subcapital femoral neck fracture.
2. Remote bilateral superior and inferior pubic rami fractures.

## 2018-04-30 IMAGING — CT CT HEAD W/O CM
3 series · 16 of 47 positions shown, 19 images · non-contrast
Comparison: 03/29/2017

CLINICAL DATA: Posttraumatic headache

EXAM:
CT HEAD WITHOUT CONTRAST
TECHNIQUE: Contiguous axial images were obtained from the base of the skull
through the vertex without intravenous contrast.

[Series 2: head wo · axial · 0.43mm/px · z∈[-234,-94]mm · 10 of 34 slices shown, 13 images]
[im 3/34  brain]
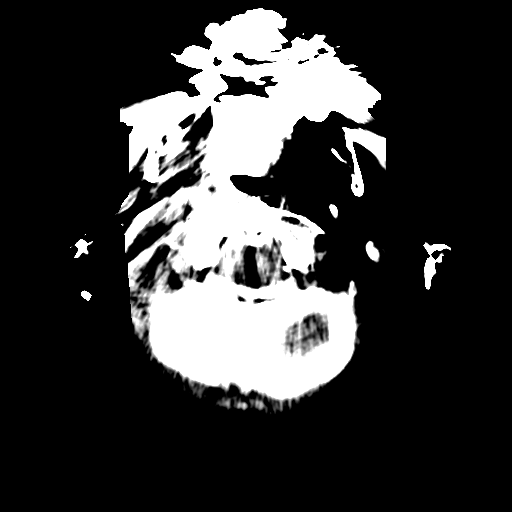
[im 3/34  bone]
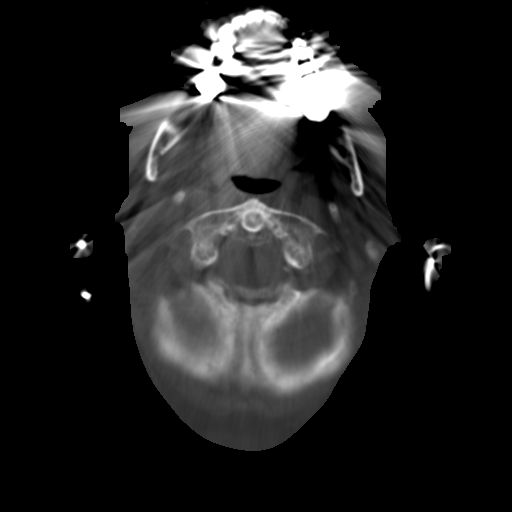
[im 6/34  brain]
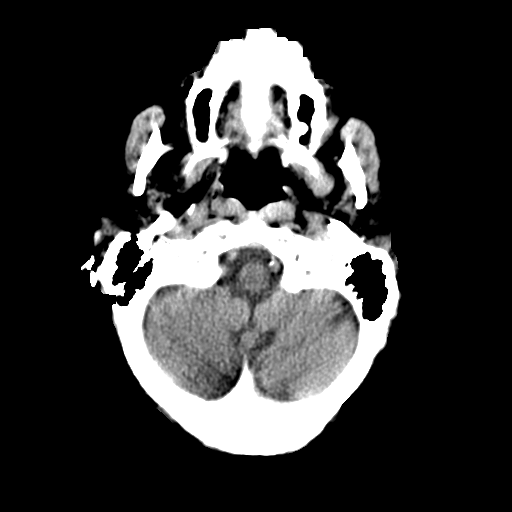
[im 10/34  brain]
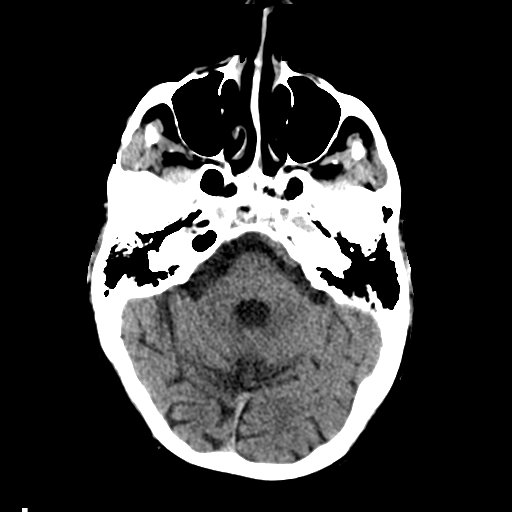
[im 12/34  brain]
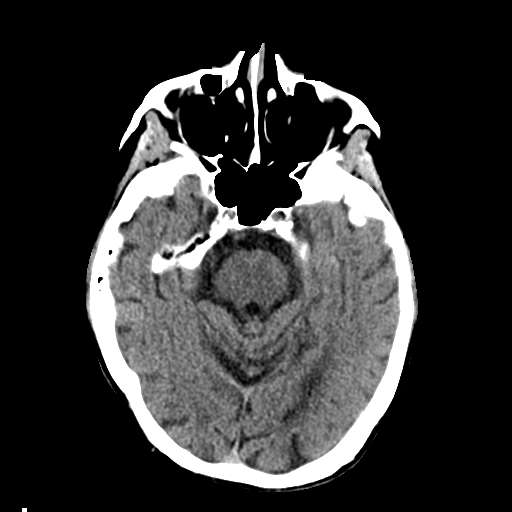
[im 15/34  brain]
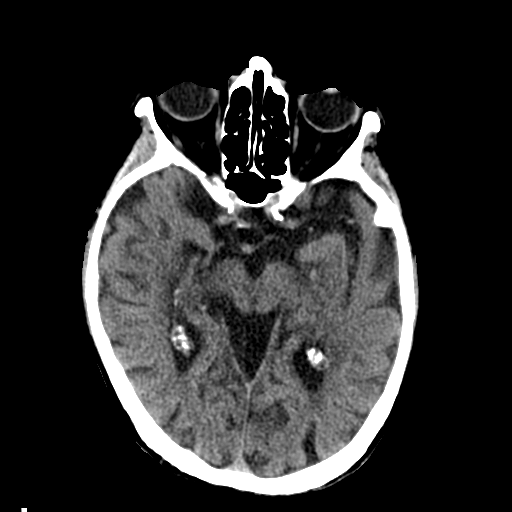
[im 15/34  bone]
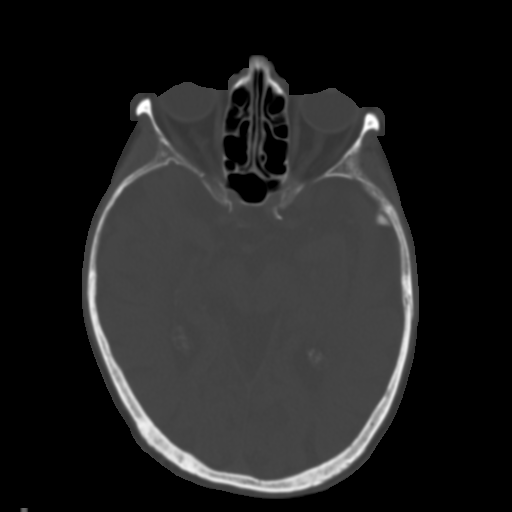
[im 19/34  brain]
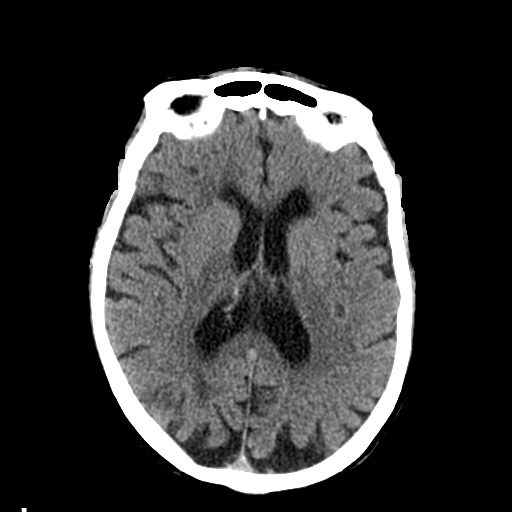
[im 22/34  brain]
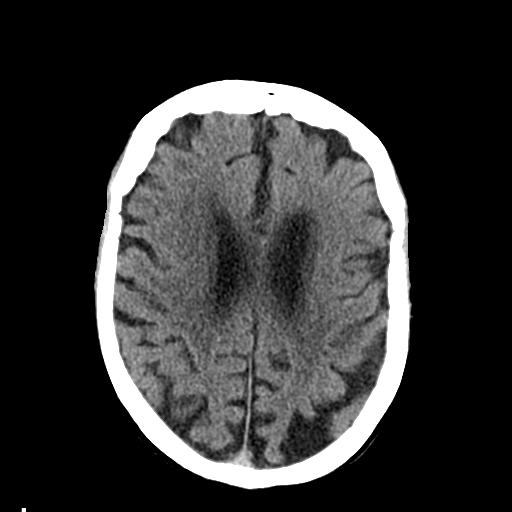
[im 26/34  brain]
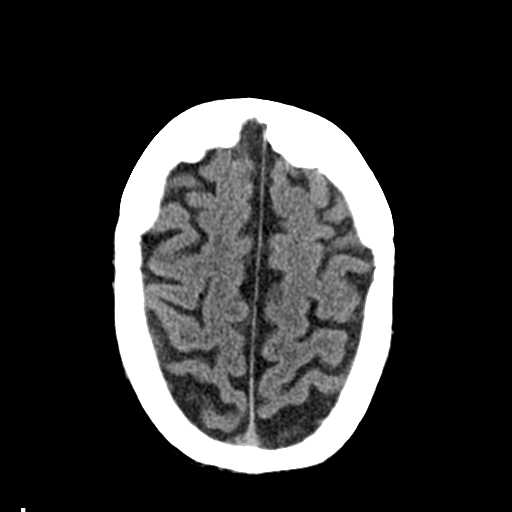
[im 28/34  brain]
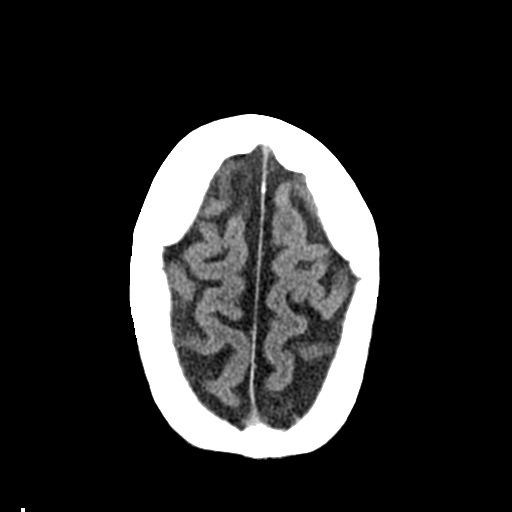
[im 28/34  bone]
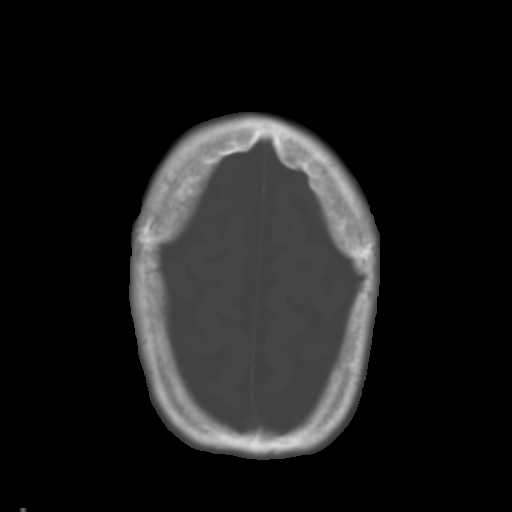
[im 31/34  brain]
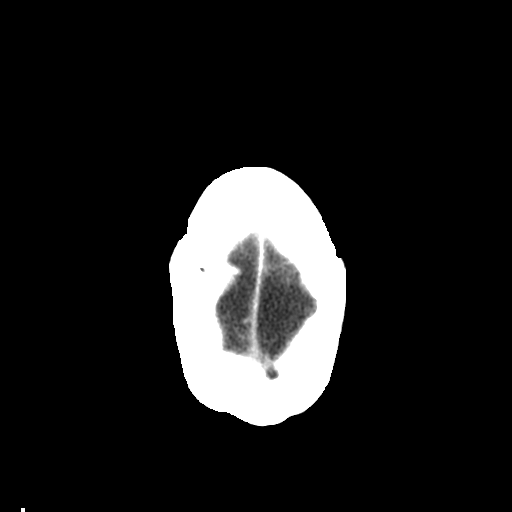

[Series 4: coronal soft tissue · coronal · 0.33mm/px · 3 of 67 slices shown]
[im 23/67  brain]
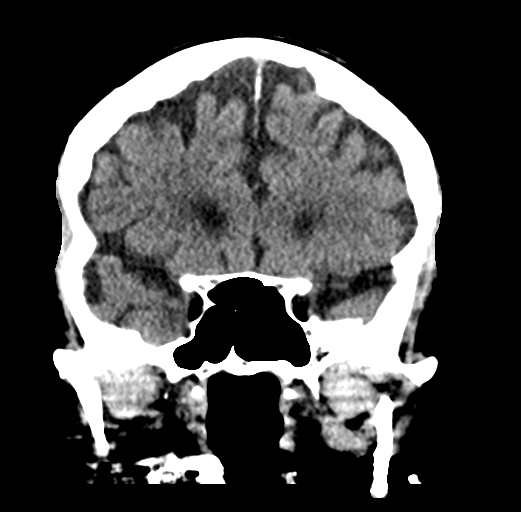
[im 30/67  brain]
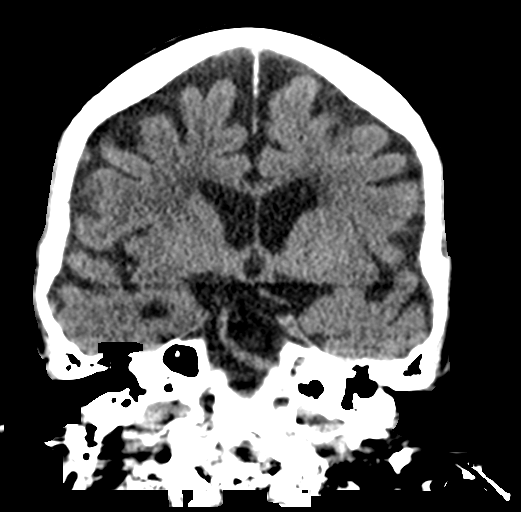
[im 37/67  brain]
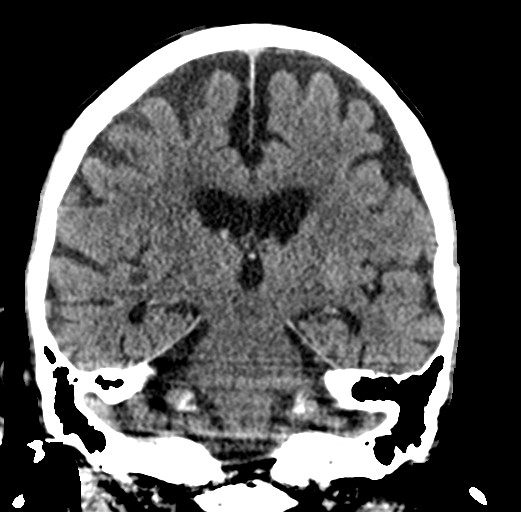

[Series 5: sagittal soft tissue · sagittal · 0.33mm/px · 3 of 54 slices shown]
[im 18/54  brain]
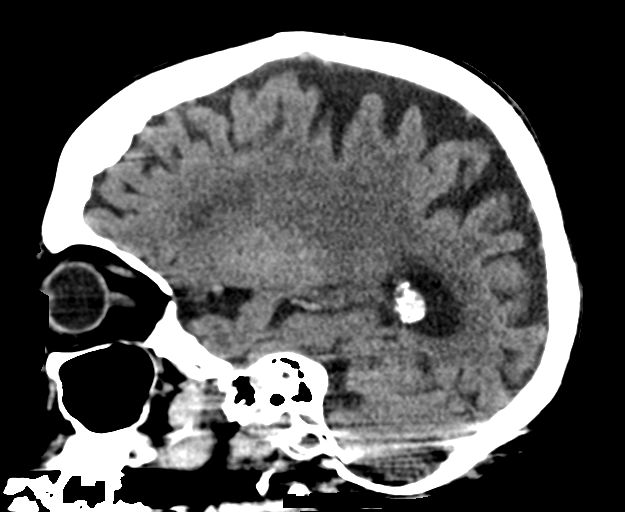
[im 27/54  brain]
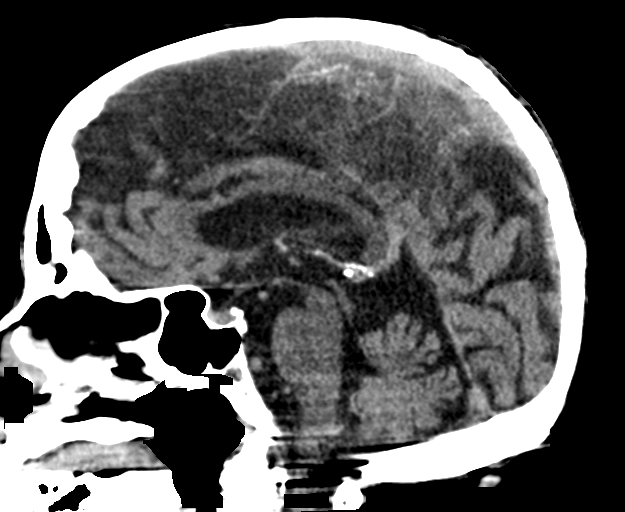
[im 36/54  brain]
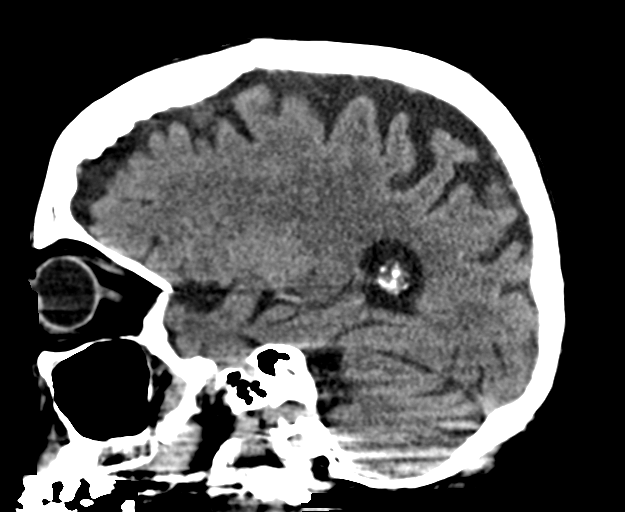

[16 of 47 positions shown; findings below may reference images not displayed]

FINDINGS: Brain: No evidence of acute infarction, hemorrhage, hydrocephalus,
extra-axial collection or mass lesion/mass effect. Generalized
atrophy is mild to moderate for age. Mild chronic small vessel
ischemic change in the cerebral white matter

Vascular: No hyperdense vessel or unexpected calcification.

Skull: Negative for fracture

Sinuses/Orbits: No evidence of injury
IMPRESSION: No evidence of intracranial injury.

## 2018-04-30 MED ORDER — ACETAMINOPHEN 650 MG RE SUPP: 650.0000 mg | Freq: Four times a day (QID) | RECTAL | Status: DC | PRN

## 2018-04-30 MED ORDER — PROBIOTIC PO CAPS: ORAL_CAPSULE | Freq: Every day | ORAL | Status: DC

## 2018-04-30 MED ORDER — HYDROMORPHONE HCL 1 MG/ML IJ SOLN
1.0000 mg | Freq: Once | INTRAMUSCULAR | Status: AC
  Administered 2018-04-30: 1 mg via INTRAVENOUS
  Filled 2018-04-30: qty 1

## 2018-04-30 MED ORDER — ACETAMINOPHEN 325 MG PO TABS
650.0000 mg | ORAL_TABLET | Freq: Four times a day (QID) | ORAL | Status: DC | PRN
  Administered 2018-04-30 – 2018-05-05 (×7): 650 mg via ORAL
  Filled 2018-04-30 (×7): qty 2

## 2018-04-30 MED ORDER — ONDANSETRON HCL 4 MG/2ML IJ SOLN
4.0000 mg | Freq: Four times a day (QID) | INTRAMUSCULAR | Status: DC | PRN
  Administered 2018-05-01: 4 mg via INTRAVENOUS
  Filled 2018-04-30: qty 2

## 2018-04-30 MED ORDER — POLYETHYL GLYCOL-PROPYL GLYCOL 0.4-0.3 % OP SOLN: 1.0000 [drp] | Freq: Two times a day (BID) | OPHTHALMIC | Status: DC

## 2018-04-30 MED ORDER — MORPHINE SULFATE (PF) 2 MG/ML IV SOLN
2.0000 mg | INTRAVENOUS | Status: DC | PRN
  Administered 2018-05-04: 2 mg via INTRAVENOUS
  Filled 2018-04-30 (×2): qty 1

## 2018-04-30 MED ORDER — ACYCLOVIR 800 MG PO TABS
800.0000 mg | ORAL_TABLET | Freq: Two times a day (BID) | ORAL | Status: DC
  Administered 2018-04-30 – 2018-05-05 (×9): 800 mg via ORAL
  Filled 2018-04-30 (×11): qty 1

## 2018-04-30 MED ORDER — METOPROLOL SUCCINATE ER 25 MG PO TB24
12.5000 mg | ORAL_TABLET | Freq: Every day | ORAL | Status: DC
  Administered 2018-04-30 – 2018-05-04 (×5): 12.5 mg via ORAL
  Filled 2018-04-30 (×5): qty 1

## 2018-04-30 MED ORDER — TIMOLOL HEMIHYDRATE 0.5 % OP SOLN: 1.0000 [drp] | Freq: Two times a day (BID) | OPHTHALMIC | Status: DC

## 2018-04-30 MED ORDER — TIMOLOL MALEATE 0.5 % OP SOLN
1.0000 [drp] | Freq: Two times a day (BID) | OPHTHALMIC | Status: DC
  Administered 2018-04-30 – 2018-05-05 (×9): 1 [drp] via OPHTHALMIC
  Filled 2018-04-30: qty 5

## 2018-04-30 MED ORDER — RISAQUAD PO CAPS
1.0000 | ORAL_CAPSULE | Freq: Every day | ORAL | Status: DC
  Administered 2018-05-01 – 2018-05-05 (×4): 1 via ORAL
  Filled 2018-04-30 (×4): qty 1

## 2018-04-30 MED ORDER — CEFAZOLIN SODIUM-DEXTROSE 2-4 GM/100ML-% IV SOLN
2.0000 g | INTRAVENOUS | Status: DC
  Filled 2018-04-30 (×2): qty 100

## 2018-04-30 MED ORDER — AMIODARONE HCL 100 MG PO TABS
200.0000 mg | ORAL_TABLET | Freq: Two times a day (BID) | ORAL | Status: DC
  Administered 2018-04-30 – 2018-05-05 (×9): 200 mg via ORAL
  Filled 2018-04-30 (×9): qty 2

## 2018-04-30 MED ORDER — HYDROCODONE-ACETAMINOPHEN 5-325 MG PO TABS
1.0000 | ORAL_TABLET | Freq: Four times a day (QID) | ORAL | Status: DC | PRN
  Administered 2018-05-01 – 2018-05-02 (×3): 1 via ORAL
  Filled 2018-04-30 (×5): qty 1

## 2018-04-30 MED ORDER — MIRABEGRON ER 25 MG PO TB24
25.0000 mg | ORAL_TABLET | Freq: Every day | ORAL | Status: DC
  Administered 2018-05-01 – 2018-05-05 (×4): 25 mg via ORAL
  Filled 2018-04-30 (×4): qty 1

## 2018-04-30 MED ORDER — CHLORHEXIDINE GLUCONATE 4 % EX LIQD
60.0000 mL | Freq: Once | CUTANEOUS | Status: AC
  Administered 2018-04-30: 4 via TOPICAL

## 2018-04-30 MED ORDER — VITAMIN B-12 1000 MCG PO TABS
1000.0000 ug | ORAL_TABLET | Freq: Every day | ORAL | Status: DC
  Administered 2018-05-01 – 2018-05-05 (×4): 1000 ug via ORAL
  Filled 2018-04-30 (×4): qty 1

## 2018-04-30 MED ORDER — INSULIN ASPART 100 UNIT/ML ~~LOC~~ SOLN
0.0000 [IU] | Freq: Three times a day (TID) | SUBCUTANEOUS | Status: DC
  Administered 2018-05-02 (×2): 1 [IU] via SUBCUTANEOUS
  Administered 2018-05-03 – 2018-05-04 (×2): 2 [IU] via SUBCUTANEOUS

## 2018-04-30 MED ORDER — PANTOPRAZOLE SODIUM 40 MG PO TBEC
40.0000 mg | DELAYED_RELEASE_TABLET | Freq: Every day | ORAL | Status: DC
  Administered 2018-05-01 – 2018-05-05 (×4): 40 mg via ORAL
  Filled 2018-04-30 (×4): qty 1

## 2018-04-30 MED ORDER — POVIDONE-IODINE 10 % EX SWAB: 2.0000 "application " | Freq: Once | CUTANEOUS | Status: DC

## 2018-04-30 MED ORDER — POLYVINYL ALCOHOL 1.4 % OP SOLN
1.0000 [drp] | Freq: Two times a day (BID) | OPHTHALMIC | Status: DC
  Administered 2018-04-30 – 2018-05-05 (×9): 1 [drp] via OPHTHALMIC
  Filled 2018-04-30: qty 15

## 2018-04-30 MED ORDER — SODIUM CHLORIDE 0.9 % IV SOLN
INTRAVENOUS | Status: DC | PRN
  Administered 2018-04-30: 250 mL via INTRAVENOUS

## 2018-04-30 MED ORDER — ONDANSETRON HCL 4 MG PO TABS: 4.0000 mg | ORAL_TABLET | Freq: Four times a day (QID) | ORAL | Status: DC | PRN

## 2018-04-30 MED ORDER — PREDNISOLONE ACETATE 1 % OP SUSP
1.0000 [drp] | Freq: Four times a day (QID) | OPHTHALMIC | Status: DC
  Administered 2018-04-30 – 2018-05-05 (×18): 1 [drp] via OPHTHALMIC
  Filled 2018-04-30: qty 5

## 2018-04-30 NOTE — ED Triage Notes (Signed)
Pt slid out of chair onto floor this afternoon, pt c/o R hip pain 8/10, R LE shortened and externally rotated. Pt with hx of R hip fx.

## 2018-04-30 NOTE — ED Provider Notes (Signed)
[facility]-EMERGENCY DEPT Provider Note   CSN: [id number] Arrival date & time: 04/30/18  1313     History   Chief Complaint Chief Complaint  Patient presents with  . Fall  . Hip Pain    HPI Tracey Porter is a 84 y.o. female.  HPI 84 year old female with past medical history as below including A. fib on Eliquis here with right hip pain.  The patient reportedly slipped out of the chair onto the ground.  She landed on her right hip.  She has a history of osteoporosis and history of pelvic fracture in the past as well from a fall.  She does not believe she hit her head.  She did not lose consciousness.  She sustained immediate onset of aching, throbbing, right hip pain.  The pain is controlled when not moving, but is severe with any kind of movement.  No other alleviating or aggravating factors.  No distal numbness or weakness.  No other complaints of pain.  Past Medical History:  Diagnosis Date  . Diabetes mellitus without complication (HCC)   . Hypertension     Patient Active Problem List   Diagnosis Date Noted  . Hip fracture (HCC) 04/30/2018  . Chronic back pain 10/10/2017  . Dyslipidemia 10/10/2017  . PAF (paroxysmal atrial fibrillation) (HCC) 07/28/2017  . Orthostatic hypotension 07/28/2017  . History of GI bleed 07/28/2017  . Chronic diastolic heart failure (HCC)   . Multiple fractures of pelvis with unstable disruption of pelvic ring, initial encounter for closed fracture (HCC) 03/20/2017  . Atrial fibrillation with RVR (HCC) 03/20/2017  . Aortic atherosclerosis (HCC) 03/20/2017  . Hypotension due to blood loss   . Chronic anticoagulation   . Closed pelvic fracture (HCC) 03/19/2017  . Leukocytosis 03/19/2017  . Fall at home, initial encounter 03/19/2017  . Paroxysmal SVT (supraventricular tachycardia) (HCC)   . Essential hypertension   . Non-insulin treated type 2 diabetes mellitus (HCC) 03/20/2015  . Hypokalemia 03/19/2015    Past  Surgical History:  Procedure Laterality Date  . ABDOMINAL HYSTERECTOMY  1976  . EYE SURGERY       OB History   None      Home Medications    Prior to Admission medications   Medication Sig Start Date End Date Taking? Authorizing Provider  acyclovir (ZOVIRAX) 800 MG tablet Take 800 mg by mouth 2 (two) times daily.   Yes [provider]  amiodarone (PACERONE) 200 MG tablet Take 1 tablet (200 mg total) by mouth daily. Patient taking differently: Take 200 mg by mouth 2 (two) times daily.  07/28/17  Yes Kilroy, Eda Paschal, PA-C  apixaban (ELIQUIS) 5 MG TABS tablet Take 1 tablet (5 mg total) by mouth 2 (two) times daily. Please cancel previous rx's sent; call pt with price before filling 02/24/17  Yes Azalee Course, [place]  Cholecalciferol (D-3-5) 5000 units capsule Take 5,000 Units by mouth daily.   Yes [provider]  GLUCOSAMINE-CHONDROIT-VIT C-MN PO Take 1 tablet by mouth daily.   Yes [provider]  magnesium chloride (SLOW-MAG) 64 MG TBEC SR tablet Take 1 tablet by mouth 2 (two) times daily.   Yes [provider]  metoprolol succinate (TOPROL XL) 25 MG 24 hr tablet Take 0.5 tablets (12.5 mg total) by mouth at bedtime. 08/16/17  Yes Lennette Bihari, MD  Multiple Vitamins-Minerals (ICAPS PLUS) TABS Take 1 tablet by mouth 2 (two) times daily.   Yes [provider]  MYRBETRIQ 25 MG TB24 tablet Take  25 mg by mouth daily. 03/04/17  Yes [provider]  nitroGLYCERIN (NITROSTAT) 0.4 MG SL tablet Place 0.4 mg under the tongue every 5 (five) minutes as needed for chest pain.   Yes [provider]  pantoprazole (PROTONIX) 40 MG tablet Take 40 mg by mouth daily.   Yes [provider]  Polyethyl Glycol-Propyl Glycol (SYSTANE) 0.4-0.3 % SOLN Apply 1 drop to eye 2 (two) times daily.   Yes [provider]  prednisoLONE acetate (PRED FORTE) 1 % ophthalmic suspension Place 1 drop into the right eye 4 (four) times daily.  03/19/17  Yes  [provider]  Probiotic Product (PROBIOTIC PO) Take 1 capsule by mouth daily.   Yes [provider]  Teriparatide, Recombinant, (FORTEO Robbins) Inject 20 mcg daily into the skin.   Yes [provider]  timolol (BETIMOL) 0.5 % ophthalmic solution Place 1 drop into both eyes 2 (two) times daily.   Yes [provider]  vitamin B-12 (CYANOCOBALAMIN) 1000 MCG tablet Take 1,000 mcg by mouth daily.   Yes [provider]  vitamin C (ASCORBIC ACID) 500 MG tablet Take 500 mg by mouth 2 (two) times daily.   Yes [provider]  cephALEXin (KEFLEX) 500 MG capsule Take 1 capsule (500 mg total) by mouth 3 (three) times daily. Patient not taking: Reported on 04/30/2018 02/22/18   Elson Areas, PA-C  diltiazem [facility] CD) 360 MG 24 hr capsule TAKE 1 CAPSULE BY MOUTH  DAILY Patient not taking: Reported on 04/30/2018 03/02/18   Lennette Bihari, MD  ibuprofen (ADVIL,MOTRIN) 600 MG tablet Take 1 tablet (600 mg total) by mouth every 6 (six) hours as needed. Patient taking differently: Take 600 mg by mouth every 6 (six) hours as needed for moderate pain.  03/31/17   Tyrone Nine, MD  labetalol (NORMODYNE) 200 MG tablet TAKE 1.5 TABLETS BY MOUTH  EVERY MORNING AND TAKE 1  TABLET BY MOUTH BY MOUTH  EVERY EVENING. Patient not taking: Reported on 04/30/2018 03/02/18   Lennette Bihari, MD  loperamide (IMODIUM) 2 MG capsule Take 1 capsule (2 mg total) by mouth as needed for diarrhea or loose stools. 03/31/17   Tyrone Nine, MD    Family History Family History  Problem Relation Age of Onset  . Lung cancer Father   . Lymphoma Daughter     Social History Social History   Tobacco Use  . Smoking status: Former Games developer  . Smokeless tobacco: Never Used  Substance Use Topics  . Alcohol use: Yes  . Drug use: No     Allergies   Penicillins   Review of Systems Review of Systems  Constitutional: Negative for chills, fatigue and fever.  HENT: Negative for  congestion and rhinorrhea.   Eyes: Negative for visual disturbance.  Respiratory: Negative for cough, shortness of breath and wheezing.   Cardiovascular: Negative for chest pain and leg swelling.  Gastrointestinal: Negative for abdominal pain, diarrhea, nausea and vomiting.  Genitourinary: Negative for dysuria and flank pain.  Musculoskeletal: Positive for arthralgias and gait problem. Negative for neck pain and neck stiffness.  Skin: Negative for rash and wound.  Allergic/Immunologic: Negative for immunocompromised state.  Neurological: Negative for syncope, weakness and headaches.  All other systems reviewed and are negative.    Physical Exam Updated Vital Signs BP (!) 156/76   Pulse 78   Temp 98.1 F (36.7 C) (Oral)   Resp 18   SpO2 93%   Physical Exam  Constitutional: She  is oriented to person, place, and time. She appears well-developed and well-nourished. No distress.  HENT:  Head: Normocephalic and atraumatic.  Eyes: Conjunctivae are normal.  Neck: Neck supple.  Cardiovascular: Normal rate, regular rhythm and normal heart sounds. Exam reveals no friction rub.  No murmur heard. Pulmonary/Chest: Effort normal and breath sounds normal. No respiratory distress. She has no wheezes. She has no rales.  Abdominal: She exhibits no distension.  Musculoskeletal: She exhibits no edema.  Neurological: She is alert and oriented to person, place, and time. She exhibits normal muscle tone.  Skin: Skin is warm. Capillary refill takes less than 2 seconds. No rash noted.  Psychiatric: She has a normal mood and affect.  Nursing note and vitals reviewed.   LOWER EXTREMITY EXAM: RIGHT  INSPECTION & PALPATION: Mild shortening, external rotation R leg. TTP over hip.  SENSORY: sensation is intact to light touch in:  Superficial peroneal nerve distribution (over dorsum of foot) Deep peroneal nerve distribution (over first dorsal web space) Sural nerve distribution (over lateral aspect  5th metatarsal) Saphenous nerve distribution (over medial instep)  MOTOR:  + Motor EHL (great toe dorsiflexion) + FHL (great toe plantar flexion)  + TA (ankle dorsiflexion)  + GSC (ankle plantar flexion)  VASCULAR: 2+ dorsalis pedis and posterior tibialis pulses Capillary refill < 2 sec, toes warm and well-perfused  COMPARTMENTS: Soft, warm, well-perfused No pain with passive extension No parethesias   ED Treatments / Results  Labs (all labs ordered are listed, but only abnormal results are displayed) Labs Reviewed  BASIC METABOLIC PANEL - Abnormal; Notable for the following components:      Result Value   Glucose, Bld 103 (*)    All other components within normal limits  CBC WITH DIFFERENTIAL/PLATELET - Abnormal; Notable for the following components:   MCV 100.5 (*)    Neutro Abs 8.1 (*)    Abs Immature Granulocytes 0.09 (*)    All other components within normal limits  PROTIME-INR  TYPE AND SCREEN  ABO/RH    EKG EKG Interpretation  Date/Time:  Sunday April 30 2018 15:41:48 EDT Ventricular Rate:  74 PR Interval:    QRS Duration: 93 QT Interval:  412 QTC Calculation: 458 R Axis:   75 Text Interpretation:  Sinus rhythm Borderline prolonged PR interval Confirmed by Rolan Bucco [phone]) on 04/30/2018 3:48:00 PM Also confirmed by Rolan Bucco [phone]), editor Barbette Hair [phone])  on 04/30/2018 4:04:12 PM   Radiology Ct Head Wo Contrast  Result Date: 04/30/2018 CLINICAL DATA:  Posttraumatic headache EXAM: CT HEAD WITHOUT CONTRAST TECHNIQUE: Contiguous axial images were obtained from the base of the skull through the vertex without intravenous contrast. COMPARISON:  03/29/2017 FINDINGS: Brain: No evidence of acute infarction, hemorrhage, hydrocephalus, extra-axial collection or mass lesion/mass effect. Generalized atrophy is mild to moderate for age. Mild chronic small vessel ischemic change in the cerebral white matter Vascular: No hyperdense vessel or  unexpected calcification. Skull: Negative for fracture Sinuses/Orbits: No evidence of injury IMPRESSION: No evidence of intracranial injury. Electronically Signed   By: Marnee Spring M.D.   On: 04/30/2018 15:40   Dg Hip Unilat  With Pelvis 2-3 Views Right  Result Date: 04/30/2018 CLINICAL DATA:  Slid out of chair. Fall. Right hip pain. Deformity to the right lower extremity. EXAM: DG HIP (WITH OR WITHOUT PELVIS) 2-3V RIGHT COMPARISON:  None. FINDINGS: A right subcapital femoral neck fracture is displaced superiorly with varus angulation. Bilateral superior and inferior pubic rami fractures appear remote. Moderate osteopenia is present.  No additional fractures are present. IMPRESSION: 1. Acute right subcapital femoral neck fracture. 2. Remote bilateral superior and inferior pubic rami fractures. Electronically Signed   By: Marin Roberts M.D.   On: 04/30/2018 14:37    Procedures Procedures (including critical care time)  Medications Ordered in ED Medications - No data to display   Initial Impression / Assessment and Plan / ED Course  I have reviewed the triage vital signs and the nursing notes.  Pertinent labs & imaging results that were available during my care of the patient were reviewed by me and considered in my medical decision making (see chart for details).     84 yo F here with R hip pain s/p fall. Imaging shows IT hip fx. CT head neg. Labs o/w reassuring. No syncope. Discussed with Ortho, will transfer to [facility] for likely OR in AM with Dr. Jena Gauss. Pt to be NPO at [facility].  Final Clinical Impressions(s) / ED Diagnoses   Final diagnoses:  Closed fracture of right hip, initial encounter [facility])    ED Discharge Orders    None       Shaune Pollack, MD 04/30/18 1722

## 2018-04-30 NOTE — ED Notes (Signed)
Bed: ZO10 Expected date: 04/30/18 Expected time: 1:17 PM Means of arrival: Ambulance Comments: Fall hip pain

## 2018-04-30 NOTE — Progress Notes (Signed)
Patient with a right hip fracture, [facility], hold eliquis, Dr. Jena Gauss to operate tomorrow.   Full consult to follow.   Eulas Post, MD

## 2018-04-30 NOTE — ED Notes (Signed)
Attempted to call report at this time but nurse state she is busy right now and what report call after 7 pm.

## 2018-04-30 NOTE — ED Notes (Signed)
ED TO INPATIENT HANDOFF REPORT  Name/Age/Gender Tracey Porter 84 y.o. female  Code Status Code Status History    Date Active Date Inactive Code Status Order ID Comments User Context   03/20/2017 0220 03/31/2017 1547 Full Code [id number]  Toy Baker, MD Inpatient   03/19/2015 2359 03/20/2015 1934 Full Code [id number]  Etta Quill, DO Inpatient      Home/SNF/Other Skilled nursing facility  Chief Complaint fall  Level of Care/Admitting Diagnosis ED Disposition    ED Disposition Condition [place]: [place] [[id number]]  Level of Care: Telemetry [5]  Diagnosis: Hip fracture [facility]) [[id number]]  Admitting Physician: Caren Griffins [phone]  Attending Physician: Caren Griffins [phone]  Estimated length of stay: past midnight tomorrow  Certification:: I certify this patient will need inpatient services for at least 2 midnights  PT Class (Do Not Modify): Inpatient [101]  PT Acc Code (Do Not Modify): Private [1]       Medical History Past Medical History:  Diagnosis Date  . Diabetes mellitus without complication ([place])   . Hypertension     Allergies Allergies  Allergen Reactions  . Penicillins Rash    IV Location/Drains/Wounds Patient Lines/Drains/Airways Status   Active Line/Drains/Airways    Name:   Placement date:   Placement time:   Site:   Days:   Peripheral IV 04/30/18 Right Wrist   04/30/18    1445    Wrist   less than 1          Labs/Imaging Results for orders placed or performed during the hospital encounter of 04/30/18 (from the past 48 hour(s))  Type and screen [place]     Status: None   Collection Time: 04/30/18  2:37 PM  Result Value Ref Range   ABO/RH(D) A POS    Antibody Screen NEG    Sample Expiration      05/03/2018 Performed at [facility], [place] [address], [place], [place] [address]   Basic metabolic panel     Status: Abnormal   Collection Time:  04/30/18  2:37 PM  Result Value Ref Range   Sodium 137 135 - 145 mmol/L   Potassium 4.2 3.5 - 5.1 mmol/L   Chloride 101 98 - 111 mmol/L   CO2 26 22 - 32 mmol/L   Glucose, Bld 103 (H) 70 - 99 mg/dL   BUN 21 8 - 23 mg/dL   Creatinine, Ser 0.65 0.44 - 1.00 mg/dL   Calcium 9.3 8.9 - 10.3 mg/dL   GFR calc non Af Amer >60 >60 mL/min   GFR calc Af Amer >60 >60 mL/min    Comment: (NOTE) The eGFR has been calculated using the CKD EPI equation. This calculation has not been validated in all clinical situations. eGFR's persistently <60 mL/min signify possible Chronic Kidney Disease.    Anion gap 10 5 - 15    Comment: Performed at [facility], [place] [address], [place], [place] [address]  CBC with Differential     Status: Abnormal   Collection Time: 04/30/18  2:37 PM  Result Value Ref Range   WBC 9.6 4.0 - 10.5 K/uL   RBC 4.02 3.87 - 5.11 MIL/uL   Hemoglobin 12.8 12.0 - 15.0 g/dL   HCT 40.4 36.0 - 46.0 %   MCV 100.5 (H) 80.0 - 100.0 fL   MCH 31.8 26.0 - 34.0 pg   MCHC 31.7 30.0 - 36.0 g/dL   RDW 13.8  11.5 - 15.5 %   Platelets 272 150 - 400 K/uL   nRBC 0.0 0.0 - 0.2 %   Neutrophils Relative % 84 %   Neutro Abs 8.1 (H) 1.7 - 7.7 K/uL   Lymphocytes Relative 8 %   Lymphs Abs 0.7 0.7 - 4.0 K/uL   Monocytes Relative 7 %   Monocytes Absolute 0.6 0.1 - 1.0 K/uL   Eosinophils Relative 0 %   Eosinophils Absolute 0.0 0.0 - 0.5 K/uL   Basophils Relative 0 %   Basophils Absolute 0.0 0.0 - 0.1 K/uL   Immature Granulocytes 1 %   Abs Immature Granulocytes 0.09 (H) 0.00 - 0.07 K/uL    Comment: Performed at [facility], [place] [address], [place], [place] [address]  Protime-INR     Status: None   Collection Time: 04/30/18  4:05 PM  Result Value Ref Range   Prothrombin Time 15.1 11.4 - 15.2 seconds   INR 1.20     Comment: Performed at [facility], [place] [address] [place], [place] [address]   Ct Head Wo Contrast  Result Date:  04/30/2018 CLINICAL DATA:  Posttraumatic headache EXAM: CT HEAD WITHOUT CONTRAST TECHNIQUE: Contiguous axial images were obtained from the base of the skull through the vertex without intravenous contrast. COMPARISON:  03/29/2017 FINDINGS: Brain: No evidence of acute infarction, hemorrhage, hydrocephalus, extra-axial collection or mass lesion/mass effect. Generalized atrophy is mild to moderate for age. Mild chronic small vessel ischemic change in the cerebral white matter Vascular: No hyperdense vessel or unexpected calcification. Skull: Negative for fracture Sinuses/Orbits: No evidence of injury IMPRESSION: No evidence of intracranial injury. Electronically Signed   By: Monte Fantasia M.D.   On: 04/30/2018 15:40   Dg Hip Unilat  With Pelvis 2-3 Views Right  Result Date: 04/30/2018 CLINICAL DATA:  Slid out of chair. Fall. Right hip pain. Deformity to the right lower extremity. EXAM: DG HIP (WITH OR WITHOUT PELVIS) 2-3V RIGHT COMPARISON:  None. FINDINGS: A right subcapital femoral neck fracture is displaced superiorly with varus angulation. Bilateral superior and inferior pubic rami fractures appear remote. Moderate osteopenia is present. No additional fractures are present. IMPRESSION: 1. Acute right subcapital femoral neck fracture. 2. Remote bilateral superior and inferior pubic rami fractures. Electronically Signed   By: San Morelle M.D.   On: 04/30/2018 14:37    Pending Labs Unresulted Labs (From admission, onward)    Start     Ordered   04/30/18 1437  ABO/Rh  Once,   R     04/30/18 1437   Signed and Held  Comprehensive metabolic panel  Tomorrow morning,   R     Signed and Held   Signed and Held  Basic metabolic panel  Tomorrow morning,   R     Signed and Held          Vitals/Pain Today's Vitals   04/30/18 1610 04/30/18 1634 04/30/18 1635 04/30/18 1636  BP: (!) 145/74 (!) 156/76    Pulse: 74 75 78 78  Resp: '15 18 20 18  ' Temp:      TempSrc:      SpO2: 93% 92% 94% 93%   PainSc:        Isolation Precautions No active isolations  Medications Medications - No data to display  Mobility walks with device

## 2018-04-30 NOTE — H&P (Addendum)
History and Physical    Tracey Porter ZOX:[id number] DOB: April 07, 1930 DOA: 04/30/2018  I have briefly reviewed the patient's prior medical records in [facility]  PCP: Florentina Jenny, MD  Patient coming from: home  Chief Complaint: hip pain  HPI: Tracey Porter is a 84 y.o. female with medical history significant of hypertension, hyperlipidemia, paroxysmal A. fib on Eliquis, chronic diastolic CHF, paroxysmal SVT who presents to the hospital with chief complaint of hip pain.  Patient was sitting in a chair, try to get up and slid off of the chair and fell to the ground.  She immediately experienced pain on the right side.  She denies any syncopal episodes.  Prior to the fall she has been in her normal state of health, with no chest pain, no shortness of breath, no palpitations, no abdominal pain, nausea vomiting or diarrhea.  Denies any fever or chills.  Denies any lightheadedness/dizziness/numbness or tingling.  She denies any dyspnea or chest pain with exertion.  ED Course: In the ED her vital signs are stable, she is afebrile satting well on room air.  Blood work is unremarkable.  She had a head CT without acute findings.  Hip x-ray showed acute right subcapital femoral neck fracture as well as old inferior pubic rami fractures.  Orthopedic surgery was consulted, we were asked to admit and transfer patient from [facility] ED to [facility] for operative repair.  Review of Systems: As per HPI otherwise 10 point review of systems negative.   Past Medical History:  Diagnosis Date  . Diabetes mellitus without complication (HCC)   . Hypertension     Past Surgical History:  Procedure Laterality Date  . ABDOMINAL HYSTERECTOMY  1976  . EYE SURGERY       reports that she has quit smoking. She has never used smokeless tobacco. She reports that she drinks alcohol. She reports that she does not use drugs.  Allergies  Allergen Reactions  . Penicillins Rash    Family History  Problem  Relation Age of Onset  . Lung cancer Father   . Lymphoma Daughter     Prior to Admission medications   Medication Sig Start Date End Date Taking? Authorizing Provider  acyclovir (ZOVIRAX) 400 MG tablet Take 800 mg by mouth 2 (two) times daily. 03/04/17  Yes [provider]  amiodarone (PACERONE) 200 MG tablet Take 1 tablet (200 mg total) by mouth daily. 07/28/17  Yes Kilroy, Eda Paschal, PA-C  apixaban (ELIQUIS) 5 MG TABS tablet Take 1 tablet (5 mg total) by mouth 2 (two) times daily. Please cancel previous rx's sent; call pt with price before filling 02/24/17  Yes Meng, Fraser, PA  AZO-CRANBERRY PO Take 1 tablet by mouth 2 (two) times daily.   Yes [provider]  Cholecalciferol (D-3-5) 5000 units capsule Take 5,000 Units by mouth daily.   Yes [provider]  diltiazem (CARDIZEM CD) 360 MG 24 hr capsule TAKE 1 CAPSULE BY MOUTH  DAILY 03/02/18  Yes Lennette Bihari, MD  GLUCOSAMINE-CHONDROIT-VIT C-MN PO Take 1 tablet by mouth daily.   Yes [provider]  labetalol (NORMODYNE) 200 MG tablet TAKE 1.5 TABLETS BY MOUTH  EVERY MORNING AND TAKE 1  TABLET BY MOUTH BY MOUTH  EVERY EVENING. 03/02/18  Yes Lennette Bihari, MD  magnesium chloride (SLOW-MAG) 64 MG TBEC SR tablet Take 1 tablet by mouth 2 (two) times daily.   Yes [provider]  Multiple Vitamins-Minerals (ICAPS PLUS) TABS Take 1 tablet by mouth  2 (two) times daily.   Yes [provider]  MYRBETRIQ 25 MG TB24 tablet Take 25 mg by mouth daily. 03/04/17  Yes [provider]  nitroGLYCERIN (NITROSTAT) 0.4 MG SL tablet Place 0.4 mg under the tongue every 5 (five) minutes as needed for chest pain.   Yes [provider]  pantoprazole (PROTONIX) 40 MG tablet Take 40 mg by mouth daily.   Yes [provider]  prednisoLONE acetate (PRED FORTE) 1 % ophthalmic suspension Place 1 drop into the right eye 4 (four) times daily.  03/19/17  Yes [provider]  Probiotic Product  (PROBIOTIC PO) Take 1 capsule by mouth daily.   Yes [provider]  ranitidine (ZANTAC) 150 MG tablet Take 150 mg by mouth daily.   Yes [provider]  Teriparatide, Recombinant, (FORTEO [place]) Inject 20 mcg daily into the skin.   Yes [provider]  timolol (BETIMOL) 0.5 % ophthalmic solution Place 1 drop into both eyes 2 (two) times daily.   Yes [provider]  vitamin B-12 (CYANOCOBALAMIN) 1000 MCG tablet Take 1,000 mcg by mouth daily.   Yes [provider]  cephALEXin (KEFLEX) 500 MG capsule Take 1 capsule (500 mg total) by mouth 3 (three) times daily. 02/22/18   Elson Areas, PA-C  ibuprofen (ADVIL,MOTRIN) 600 MG tablet Take 1 tablet (600 mg total) by mouth every 6 (six) hours as needed. 03/31/17   Tyrone Nine, MD  loperamide (IMODIUM) 2 MG capsule Take 1 capsule (2 mg total) by mouth as needed for diarrhea or loose stools. 03/31/17   Tyrone Nine, MD  metoprolol succinate (TOPROL XL) 25 MG 24 hr tablet Take 0.5 tablets (12.5 mg total) by mouth at bedtime. 08/16/17   Lennette Bihari, MD  Polyethyl Glycol-Propyl Glycol (SYSTANE) 0.4-0.3 % SOLN Apply 1 drop to eye 2 (two) times daily.    [provider]  vitamin C (ASCORBIC ACID) 500 MG tablet Take 500 mg by mouth 2 (two) times daily.    [provider]    Physical Exam: Vitals:   04/30/18 1610 04/30/18 1634 04/30/18 1635 04/30/18 1636  BP: (!) 145/74 (!) 156/76    Pulse: 74 75 78 78  Resp: 15 18 20 18   Temp:      TempSrc:      SpO2: 93% 92% 94% 93%    Constitutional: NAD, calm, comfortable Eyes: PERRL, lids and conjunctivae normal ENMT: Mucous membranes are moist.  Neck: normal, supple Respiratory: clear to auscultation bilaterally, no wheezing, no crackles. Normal respiratory effort. No accessory muscle use.  Cardiovascular: Regular rate and rhythm, soft SEM heard. No extremity edema. 2+ pedal pulses.  Abdomen: no tenderness, no masses palpated. Bowel sounds positive.   Musculoskeletal: no clubbing / cyanosis. Normal muscle tone.  Skin: no rashes, lesions, ulcers. No induration Neurologic: CN 2-12 grossly intact. Strength 5/5 in all 4.  Psychiatric: Normal judgment and insight. Alert and oriented x 3. Normal mood.   Labs on Admission: I have personally reviewed following labs and imaging studies  CBC: Recent Labs  Lab 04/30/18 1437  WBC 9.6  NEUTROABS 8.1*  HGB 12.8  HCT 40.4  MCV 100.5*  PLT 272   Basic Metabolic Panel: Recent Labs  Lab 04/30/18 1437  NA 137  K 4.2  CL 101  CO2 26  GLUCOSE 103*  BUN 21  CREATININE 0.65  CALCIUM 9.3   GFR: CrCl cannot be calculated (Unknown ideal weight.). Liver Function Tests: No results for input(s):  AST, ALT, ALKPHOS, BILITOT, PROT, ALBUMIN in the last 168 hours. No results for input(s): LIPASE, AMYLASE in the last 168 hours. No results for input(s): AMMONIA in the last 168 hours. Coagulation Profile: Recent Labs  Lab 04/30/18 1605  INR 1.20   Cardiac Enzymes: No results for input(s): CKTOTAL, CKMB, CKMBINDEX, TROPONINI in the last 168 hours. BNP (last 3 results) No results for input(s): PROBNP in the last 8760 hours. HbA1C: No results for input(s): HGBA1C in the last 72 hours. CBG: No results for input(s): GLUCAP in the last 168 hours. Lipid Profile: No results for input(s): CHOL, HDL, LDLCALC, TRIG, CHOLHDL, LDLDIRECT in the last 72 hours. Thyroid Function Tests: No results for input(s): TSH, T4TOTAL, FREET4, T3FREE, THYROIDAB in the last 72 hours. Anemia Panel: No results for input(s): VITAMINB12, FOLATE, FERRITIN, TIBC, IRON, RETICCTPCT in the last 72 hours. Urine analysis:    Component Value Date/Time   COLORURINE YELLOW 02/22/2018 2003   APPEARANCEUR CLEAR 02/22/2018 2003   LABSPEC 1.011 02/22/2018 2003   PHURINE 6.0 02/22/2018 2003   GLUCOSEU NEGATIVE 02/22/2018 2003   HGBUR NEGATIVE 02/22/2018 2003   BILIRUBINUR NEGATIVE 02/22/2018 2003   KETONESUR NEGATIVE 02/22/2018  2003   PROTEINUR NEGATIVE 02/22/2018 2003   UROBILINOGEN 0.2 03/19/2015 1945   NITRITE NEGATIVE 02/22/2018 2003   LEUKOCYTESUR SMALL (A) 02/22/2018 2003     Radiological Exams on Admission: Ct Head Wo Contrast  Result Date: 04/30/2018 CLINICAL DATA:  Posttraumatic headache EXAM: CT HEAD WITHOUT CONTRAST TECHNIQUE: Contiguous axial images were obtained from the base of the skull through the vertex without intravenous contrast. COMPARISON:  03/29/2017 FINDINGS: Brain: No evidence of acute infarction, hemorrhage, hydrocephalus, extra-axial collection or mass lesion/mass effect. Generalized atrophy is mild to moderate for age. Mild chronic small vessel ischemic change in the cerebral white matter Vascular: No hyperdense vessel or unexpected calcification. Skull: Negative for fracture Sinuses/Orbits: No evidence of injury IMPRESSION: No evidence of intracranial injury. Electronically Signed   By: Marnee Spring M.D.   On: 04/30/2018 15:40   Dg Hip Unilat  With Pelvis 2-3 Views Right  Result Date: 04/30/2018 CLINICAL DATA:  Slid out of chair. Fall. Right hip pain. Deformity to the right lower extremity. EXAM: DG HIP (WITH OR WITHOUT PELVIS) 2-3V RIGHT COMPARISON:  None. FINDINGS: A right subcapital femoral neck fracture is displaced superiorly with varus angulation. Bilateral superior and inferior pubic rami fractures appear remote. Moderate osteopenia is present. No additional fractures are present. IMPRESSION: 1. Acute right subcapital femoral neck fracture. 2. Remote bilateral superior and inferior pubic rami fractures. Electronically Signed   By: Marin Roberts M.D.   On: 04/30/2018 14:37    EKG: Independently reviewed.  Sinus rhythm  Assessment/Plan Principal Problem:   Hip fracture (HCC) Active Problems:   Non-insulin treated type 2 diabetes mellitus (HCC)   Paroxysmal SVT (supraventricular tachycardia) (HCC)   Essential hypertension   Chronic anticoagulation   Chronic diastolic  heart failure (HCC)   PAF (paroxysmal atrial fibrillation) (HCC)   Dyslipidemia   Right hip fracture -Orthopedic surgery consulted, Dr. Jena Gauss, admit patient to [facility] from [place] ED per the request -N.p.o. after midnight, hold Eliquis -Gupta perioperative risk 0.8%, she was quite active and at baseline prior to the fall, no additional preop tests needed at this point  Paroxysmal atrial fibrillation - patient's CHA2DS2-VASc Score for Stroke Risk is > 2, she is on Eliquis.  Continue amiodarone and beta-blockers, hold Eliquis, resume per orthopedic surgery postop -Currently in sinus rhythm, will keep on  telemetry perioperative -resume home medications once reconciled by pharmacy   Diet-controlled diabetes mellitus -Most recent A1c 6.2, placed on sliding scale  HTN -resume home meds pending pharmacy reconciliation   Chronic diastolic CHF -euvolemic  Viral infection, right eye -per patient, on Acyclovir, continue. Not sure of exact diagnosis   DVT prophylaxis: SCDs, Eliquis on hold Code Status: Full code per patient and daughter's wishes Family Communication: Daughter was present at the bedside Disposition Plan: Admit to telemetry, probably needs SNF on discharge Consults called: Orthopedic surgery    Admission status: Inpatient   At the time of admission, it appears that the appropriate admission status for this patient is INPATIENT. This is judged to be reasonable and necessary in order to provide the required high service intensity to ensure the patient's safety given the presenting symptoms, physical exam findings, and initial radiographic and laboratory data in the context of their chronic comorbidities. Current circumstances are hip fracture requiring surgical repair, and it is felt to place patient at high risk for further clinical deterioration threatening life, limb, or organ. Moreover, it is my clinical judgment that the patient will require inpatient hospital care  spanning beyond 2 midnights from the point of admission and that early discharge would result in unnecessary risk of decompensation and readmission or threat to life, limb or bodily function.   Pamella Pert, MD [facility] Pager [phone]  If 7PM-7AM, please contact night-coverage [email or web address] Password [email or web address]  04/30/2018, 4:45 PM

## 2018-04-30 NOTE — ED Notes (Signed)
[facility] dispatch notified for need of transport.  

## 2018-05-01 ENCOUNTER — Encounter (HOSPITAL_COMMUNITY): Payer: Self-pay | Admitting: Certified Registered Nurse Anesthetist

## 2018-05-01 ENCOUNTER — Encounter (HOSPITAL_COMMUNITY)
Admission: EM | Disposition: A | Discharge status: Skilled Nursing Facility | Payer: Self-pay | Source: Skilled Nursing Facility | Attending: Internal Medicine

## 2018-05-01 LAB — COMPREHENSIVE METABOLIC PANEL
ALBUMIN: 3.2 g/dL — AB (ref 3.5–5.0)
ALT: 21 U/L (ref 0–44)
AST: 21 U/L (ref 15–41)
Alkaline Phosphatase: 65 U/L (ref 38–126)
Anion gap: 7 (ref 5–15)
BILIRUBIN TOTAL: 0.6 mg/dL (ref 0.3–1.2)
BUN: 13 mg/dL (ref 8–23)
CALCIUM: 8.9 mg/dL (ref 8.9–10.3)
CO2: 28 mmol/L (ref 22–32)
CREATININE: 0.6 mg/dL (ref 0.44–1.00)
Chloride: 100 mmol/L (ref 98–111)
GFR calc Af Amer: >60 mL/min (ref >60)
GLUCOSE: 124 mg/dL — AB (ref 70–99)
Potassium: 3.8 mmol/L (ref 3.5–5.1)
Sodium: 135 mmol/L (ref 135–145)
Total Protein: 6.4 g/dL — ABNORMAL LOW (ref 6.5–8.1)

## 2018-05-01 LAB — GLUCOSE, CAPILLARY
GLUCOSE-CAPILLARY: 109 mg/dL — AB (ref 70–99)
GLUCOSE-CAPILLARY: 115 mg/dL — AB (ref 70–99)
Glucose-Capillary: 118 mg/dL — ABNORMAL HIGH (ref 70–99)
Glucose-Capillary: 111 mg/dL — ABNORMAL HIGH (ref 70–99)
Glucose-Capillary: 147 mg/dL — ABNORMAL HIGH (ref 70–99)

## 2018-05-01 LAB — ABO/RH: ABO/RH(D): A POS

## 2018-05-01 SURGERY — HEMIARTHROPLASTY, HIP, DIRECT ANTERIOR APPROACH, FOR FRACTURE
Anesthesia: General | Laterality: Right

## 2018-05-01 MED ORDER — PROPOFOL 10 MG/ML IV BOLUS
INTRAVENOUS | Status: AC
  Filled 2018-05-01: qty 20

## 2018-05-01 MED ORDER — FENTANYL CITRATE (PF) 250 MCG/5ML IJ SOLN
INTRAMUSCULAR | Status: AC
  Filled 2018-05-01: qty 5

## 2018-05-01 NOTE — Plan of Care (Signed)
  Problem: Elimination: Goal: Will not experience complications related to urinary retention Outcome: Completed/Met   Problem: Pain Managment: Goal: General experience of comfort will improve Outcome: Progressing

## 2018-05-01 NOTE — Progress Notes (Addendum)
ANTICOAGULATION CONSULT NOTE  Pharmacy Consult for apixaban Indication: atrial fibrillation  Labs: Recent Labs    04/30/18 1437 04/30/18 1605 05/01/18 0502  HGB 12.8  --   --   HCT 40.4  --   --   PLT 272  --   --   LABPROT  --  15.1  --   INR  --  1.20  --   CREATININE 0.65  --  0.60    Assessment: 88 yof on apixaban PTA for afib presenting s/p R R femoral neck fracture, scheduled for OR today but pushed back surgery to 10/23 due to last apixaban dose on 10/20 AM PTA. Pharmacy consulted to resume apixaban 24hrs post-op - continuing to hold for now in anticipation of surgery. CT head negative. CBC and SCr wnl. No bleed issues documented.  Goal of Therapy:  Stroke prevention Monitor platelets by anticoagulation protocol: Yes   Plan:  Resume apixaban 5mg  PO BID once 24hrs post-op per consult > continuing to hold for now with OR now scheduled for 10/23 AM Monitor CBC, s/sx bleeding  Babs Bertin, PharmD, BCPS Clinical Pharmacist Clinical phone [phone] Please check AMION for all [facility] contact numbers 05/01/2018 1:28 PM

## 2018-05-01 NOTE — Progress Notes (Addendum)
[facility] PROGRESS NOTE  Tracey Porter ZOX:[id number] DOB: December 15, 1929 DOA: 04/30/2018   PCP: Florentina Jenny, MD     Assessment/Plan: Principal Problem:   Hip fracture [facility]) Active Problems:   Non-insulin treated type 2 diabetes mellitus (HCC)   Paroxysmal SVT (supraventricular tachycardia) (HCC)   Essential hypertension   Chronic anticoagulation   Chronic diastolic heart failure (HCC)   PAF (paroxysmal atrial fibrillation) (HCC)   Dyslipidemia   84 y.o. female with medical history significant of hypertension, hyperlipidemia, paroxysmal A. fib on Eliquis, chronic diastolic CHF, paroxysmal SVT who presents to the hospital with chief complaint of hip pain. Hip x-ray showed acute right subcapital femoral neck fracture as well as old inferior pubic rami fractures.  Orthopedic surgery was consulted ,  with Dr. Jena Gauss.  Assessment and plan Displaced Right hip fracture -Orthopedic surgery consulted, Dr. Jena Gauss,   Surgery delayed until 10/23 as the patient was  on Eliquis  hold Eliquis, resume postop . Chales Abrahams perioperative risk 0.8%, she was quite active and at baseline prior to the fall, no additional preop tests needed at this point   Paroxysmal atrial fibrillation - patient's CHA2DS2-VASc Score for Stroke Risk is > 2, she is on Eliquis.  Continue amiodarone and beta-blockers, hold Eliquis, resume per orthopedic surgery postop -Currently in sinus rhythm, will keep on telemetry perioperative    Diet-controlled diabetes mellitus -Most recent A1c 6.2, placed on sliding scale  HTN -resume home meds pending pharmacy reconciliation   Chronic diastolic CHF -without exacerbation, most recent 2-D echo 05/12/17, EF of 65-70% with moderate tricuspid regurgitation  Viral infection, right eye -per patient, on Acyclovir, continue. Not sure of exact diagnosis  Gastroesophageal reflux disease Continue PPI     DVT prophylaxsis SCDs  Code Status:  Full code    Family Communication: Discussed in detail with the patient, all imaging results, lab results explained to the patient   Disposition Plan: likely need SNF post surgery     Consultants:   orthopedics  Procedures:  none  Antibiotics: Anti-infectives (From admission, onward)   Start     Dose/Rate Route Frequency Ordered Stop   05/01/18 0600  ceFAZolin (ANCEF) IVPB 2g/100 mL premix     2 g 200 mL/hr over 30 Minutes Intravenous On call to O.R. 04/30/18 2043 05/02/18 0559   04/30/18 2200  acyclovir (ZOVIRAX) tablet 800 mg     800 mg Oral 2 times daily 04/30/18 2048           HPI/Subjective: Patient lying comfortably, patient started at the bedside states that she took her Eliquis yesterday morning  Objective: Vitals:   04/30/18 2300 05/01/18 0206 05/01/18 0423 05/01/18 0600  BP:   (!) 124/58   Pulse:   69   Resp:      Temp:   98.2 F (36.8 C)   TempSrc:   Axillary   SpO2:  91% 99% 98%  Weight: 61.2 kg     Height: 5' (1.524 m)       Intake/Output Summary (Last 24 hours) at 05/01/2018 0911 Last data filed at 05/01/2018 0752 Gross per 24 hour  Intake 290.54 ml  Output 250 ml  Net 40.54 ml    Exam:  Examination:  General exam: Appears calm and comfortable  Respiratory system: Clear to auscultation. Respiratory effort normal. Cardiovascular system: S1 & S2 heard, RRR. No JVD, murmurs, rubs, gallops or clicks. No pedal edema. Gastrointestinal system: Abdomen is nondistended, soft and nontender. No organomegaly or masses felt.  Normal bowel sounds heard. Central nervous system: Alert and oriented. No focal neurological deficits. Extremities: shortening and external rotation of the right leg. Skin: No rashes, lesions or ulcers Psychiatry: Judgement and insight appear normal. Mood & affect appropriate.     Data Reviewed: I have personally reviewed following labs and imaging studies  Micro Results Recent Results (from the past 240 hour(s))  Surgical pcr screen      Status: None   Collection Time: 04/30/18 10:04 PM  Result Value Ref Range Status   MRSA, PCR NEGATIVE NEGATIVE Final   Staphylococcus aureus NEGATIVE NEGATIVE Final    Comment: (NOTE) The Xpert SA Assay (FDA approved for NASAL specimens in patients 39 years of age and older), is one component of a comprehensive surveillance program. It is not intended to diagnose infection nor to guide or monitor treatment. Performed at [facility], [address] [place], [place] [address]     Radiology Reports Ct Head Wo Contrast  Result Date: 04/30/2018 CLINICAL DATA:  Posttraumatic headache EXAM: CT HEAD WITHOUT CONTRAST TECHNIQUE: Contiguous axial images were obtained from the base of the skull through the vertex without intravenous contrast. COMPARISON:  03/29/2017 FINDINGS: Brain: No evidence of acute infarction, hemorrhage, hydrocephalus, extra-axial collection or mass lesion/mass effect. Generalized atrophy is mild to moderate for age. Mild chronic small vessel ischemic change in the cerebral white matter Vascular: No hyperdense vessel or unexpected calcification. Skull: Negative for fracture Sinuses/Orbits: No evidence of injury IMPRESSION: No evidence of intracranial injury. Electronically Signed   By: Marnee Spring M.D.   On: 04/30/2018 15:40   Dg Hip Unilat  With Pelvis 2-3 Views Right  Result Date: 04/30/2018 CLINICAL DATA:  Slid out of chair. Fall. Right hip pain. Deformity to the right lower extremity. EXAM: DG HIP (WITH OR WITHOUT PELVIS) 2-3V RIGHT COMPARISON:  None. FINDINGS: A right subcapital femoral neck fracture is displaced superiorly with varus angulation. Bilateral superior and inferior pubic rami fractures appear remote. Moderate osteopenia is present. No additional fractures are present. IMPRESSION: 1. Acute right subcapital femoral neck fracture. 2. Remote bilateral superior and inferior pubic rami fractures. Electronically Signed   By: Marin Roberts M.D.   On:  04/30/2018 14:37     CBC Recent Labs  Lab 04/30/18 1437  WBC 9.6  HGB 12.8  HCT 40.4  PLT 272  MCV 100.5*  MCH 31.8  MCHC 31.7  RDW 13.8  LYMPHSABS 0.7  MONOABS 0.6  EOSABS 0.0  BASOSABS 0.0    Chemistries  Recent Labs  Lab 04/30/18 1437 05/01/18 0502  NA 137 135  K 4.2 3.8  CL 101 100  CO2 26 28  GLUCOSE 103* 124*  BUN 21 13  CREATININE 0.65 0.60  CALCIUM 9.3 8.9  AST  --  21  ALT  --  21  ALKPHOS  --  65  BILITOT  --  0.6   ------------------------------------------------------------------------------------------------------------------ estimated creatinine clearance is 41.3 mL/min (by C-G formula based on SCr of 0.6 mg/dL). ------------------------------------------------------------------------------------------------------------------ No results for input(s): HGBA1C in the last 72 hours. ------------------------------------------------------------------------------------------------------------------ No results for input(s): CHOL, HDL, LDLCALC, TRIG, CHOLHDL, LDLDIRECT in the last 72 hours. ------------------------------------------------------------------------------------------------------------------ No results for input(s): TSH, T4TOTAL, T3FREE, THYROIDAB in the last 72 hours.  Invalid input(s): FREET3 ------------------------------------------------------------------------------------------------------------------ No results for input(s): VITAMINB12, FOLATE, FERRITIN, TIBC, IRON, RETICCTPCT in the last 72 hours.  Coagulation profile Recent Labs  Lab 04/30/18 1605  INR 1.20    No results for input(s): DDIMER in the last 72 hours.  Cardiac Enzymes No results for input(s): CKMB, TROPONINI, MYOGLOBIN in the last 168 hours.  Invalid input(s): CK ------------------------------------------------------------------------------------------------------------------ Invalid input(s): POCBNP   CBG: Recent Labs  Lab 04/30/18 2113 05/01/18 0426  05/01/18 0807  GLUCAP 120* 118* 109*       Studies: Ct Head Wo Contrast  Result Date: 04/30/2018 CLINICAL DATA:  Posttraumatic headache EXAM: CT HEAD WITHOUT CONTRAST TECHNIQUE: Contiguous axial images were obtained from the base of the skull through the vertex without intravenous contrast. COMPARISON:  03/29/2017 FINDINGS: Brain: No evidence of acute infarction, hemorrhage, hydrocephalus, extra-axial collection or mass lesion/mass effect. Generalized atrophy is mild to moderate for age. Mild chronic small vessel ischemic change in the cerebral white matter Vascular: No hyperdense vessel or unexpected calcification. Skull: Negative for fracture Sinuses/Orbits: No evidence of injury IMPRESSION: No evidence of intracranial injury. Electronically Signed   By: Marnee Spring M.D.   On: 04/30/2018 15:40   Dg Hip Unilat  With Pelvis 2-3 Views Right  Result Date: 04/30/2018 CLINICAL DATA:  Slid out of chair. Fall. Right hip pain. Deformity to the right lower extremity. EXAM: DG HIP (WITH OR WITHOUT PELVIS) 2-3V RIGHT COMPARISON:  None. FINDINGS: A right subcapital femoral neck fracture is displaced superiorly with varus angulation. Bilateral superior and inferior pubic rami fractures appear remote. Moderate osteopenia is present. No additional fractures are present. IMPRESSION: 1. Acute right subcapital femoral neck fracture. 2. Remote bilateral superior and inferior pubic rami fractures. Electronically Signed   By: Marin Roberts M.D.   On: 04/30/2018 14:37      Lab Results  Component Value Date   HGBA1C 6.2 (H) 03/20/2017   Lab Results  Component Value Date   CREATININE 0.60 05/01/2018       Scheduled Meds: . acidophilus  1 capsule Oral Daily  . acyclovir  800 mg Oral BID  . amiodarone  200 mg Oral BID  . insulin aspart  0-9 Units Subcutaneous TID WC  . metoprolol succinate  12.5 mg Oral QHS  . mirabegron ER  25 mg Oral Daily  . pantoprazole  40 mg Oral Daily  . polyvinyl  alcohol  1 drop Both Eyes BID  . povidone-iodine  2 application Topical Once  . prednisoLONE acetate  1 drop Right Eye QID  . timolol  1 drop Both Eyes BID  . vitamin B-12  1,000 mcg Oral Daily   Continuous Infusions: . sodium chloride 250 mL (04/30/18 2313)  .  ceFAZolin (ANCEF) IV       LOS: 1 day    Time spent: >30 MINS    Richarda Overlie  [facility] Pager [phone]. If 7PM-7AM, please contact night-coverage at [email or web address], password [facility] 05/01/2018, 9:11 AM  LOS: 1 day

## 2018-05-01 NOTE — Progress Notes (Signed)
Chaplain responded to spiritual consult "Patient Requests Prayer."  Visited pt who said "she was tired right now" "I don't remember asking for it."  Chaplain saw pt not open for a visit but will return if asked. Lynnell Chad [phone] pager

## 2018-05-01 NOTE — H&P (View-Only) (Signed)
Orthopaedic Trauma Service (OTS) Consult   Patient ID: Tracey Porter MRN: [id number] DOB/AGE: 03/11/1930 84 y.o.  Reason for Consult:Right femoral neck fracture Referring Physician: Terance Ice, MD [facility]  HPI: Tracey Porter is an 84 y.o. female who is being seen in consultation at the request of Dr. Cruzita Lederer for evaluation of right femoral neck fracture.  This patient is well-known to me from last year when she had sustained a complex pelvic ring injury.  We planned on treating her nonoperatively.  Unfortunately she went on to a delayed union but eventually was subsequently doing well and ambulating at her prior capabilities.  She had fallen and slipped down and landed on her hip and had immediate pain deformity and shortening of the leg.  She is brought to the emergency room where x-rays showed a right femoral neck fracture.  I was asked to consult due to her pre-existing relationship and asked that she be transferred over to [facility] for possible surgical management.  Patient has a history of A. fib with RVR she is on Eliquis.  Her last dose was yesterday morning.  She takes it twice a day.  She has no other issues of note.  Her pain in her pelvis has resolved.  Past Medical History:  Diagnosis Date  . Diabetes mellitus without complication ([place])   . Hypertension     Past Surgical History:  Procedure Laterality Date  . ABDOMINAL HYSTERECTOMY  1976  . EYE SURGERY      Family History  Problem Relation Age of Onset  . Lung cancer Father   . Lymphoma Daughter     Social History:  reports that she has quit smoking. She has never used smokeless tobacco. She reports that she drinks alcohol. She reports that she does not use drugs.  Allergies:  Allergies  Allergen Reactions  . Penicillins Rash    Medications:  No current facility-administered medications on file prior to encounter.    Current Outpatient Medications on File Prior to Encounter  Medication  Sig Dispense Refill  . acyclovir (ZOVIRAX) 800 MG tablet Take 800 mg by mouth 2 (two) times daily.    [name] amiodarone (PACERONE) 200 MG tablet Take 1 tablet (200 mg total) by mouth daily. (Patient taking differently: Take 200 mg by mouth 2 (two) times daily. ) 90 tablet 3  . apixaban (ELIQUIS) 5 MG TABS tablet Take 1 tablet (5 mg total) by mouth 2 (two) times daily. Please cancel previous rx's sent; call pt with price before filling 180 tablet 3  . Cholecalciferol (D-3-5) 5000 units capsule Take 5,000 Units by mouth daily.    [name] GLUCOSAMINE-CHONDROIT-VIT C-MN PO Take 1 tablet by mouth daily.    . magnesium chloride (SLOW-MAG) 64 MG TBEC SR tablet Take 1 tablet by mouth 2 (two) times daily.    . metoprolol succinate (TOPROL XL) 25 MG 24 hr tablet Take 0.5 tablets (12.5 mg total) by mouth at bedtime. 30 tablet 6  . Multiple Vitamins-Minerals (ICAPS PLUS) TABS Take 1 tablet by mouth 2 (two) times daily.    [name] MYRBETRIQ 25 MG TB24 tablet Take 25 mg by mouth daily.    . nitroGLYCERIN (NITROSTAT) 0.4 MG SL tablet Place 0.4 mg under the tongue every 5 (five) minutes as needed for chest pain.    . pantoprazole (PROTONIX) 40 MG tablet Take 40 mg by mouth daily.    Vladimir Faster Glycol-Propyl Glycol (SYSTANE) 0.4-0.3 % SOLN Apply 1 drop to eye 2 (two) times daily.    [name]  prednisoLONE acetate (PRED FORTE) 1 % ophthalmic suspension Place 1 drop into the right eye 4 (four) times daily.     . Probiotic Product (PROBIOTIC PO) Take 1 capsule by mouth daily.    . Teriparatide, Recombinant, (FORTEO [place]) Inject 20 mcg daily into the skin.    [name] timolol (BETIMOL) 0.5 % ophthalmic solution Place 1 drop into both eyes 2 (two) times daily.    . vitamin B-12 (CYANOCOBALAMIN) 1000 MCG tablet Take 1,000 mcg by mouth daily.    . vitamin C (ASCORBIC ACID) 500 MG tablet Take 500 mg by mouth 2 (two) times daily.    . cephALEXin (KEFLEX) 500 MG capsule Take 1 capsule (500 mg total) by mouth 3 (three) times daily. (Patient not taking:  Reported on 04/30/2018) 40 capsule 0  . diltiazem (CARDIZEM CD) 360 MG 24 hr capsule TAKE 1 CAPSULE BY MOUTH  DAILY (Patient not taking: Reported on 04/30/2018) 90 capsule 3  . ibuprofen (ADVIL,MOTRIN) 600 MG tablet Take 1 tablet (600 mg total) by mouth every 6 (six) hours as needed. (Patient taking differently: Take 600 mg by mouth every 6 (six) hours as needed for moderate pain. ) 30 tablet 0  . labetalol (NORMODYNE) 200 MG tablet TAKE 1.5 TABLETS BY MOUTH  EVERY MORNING AND TAKE 1  TABLET BY MOUTH BY MOUTH  EVERY EVENING. (Patient not taking: Reported on 04/30/2018) 225 tablet 3  . loperamide (IMODIUM) 2 MG capsule Take 1 capsule (2 mg total) by mouth as needed for diarrhea or loose stools. 30 capsule 0    ROS: Constitutional: No fever or chills Vision: No changes in vision ENT: No difficulty swallowing CV: No chest pain Pulm: No SOB or wheezing GI: No nausea or vomiting GU: No urgency or inability to hold urine Skin: No poor wound healing Neurologic: No numbness or tingling Psychiatric: No depression or anxiety Heme: No bruising Allergic: No reaction to medications or food   Exam: Blood pressure (!) 124/58, pulse 69, temperature 98.2 F (36.8 C), temperature source Axillary, resp. rate 17, height 5' (1.524 m), weight 61.2 kg, SpO2 98 %. General: No acute distress Orientation: Awake alert and oriented x3 Mood and Affect: Cooperative and pleasant Gait: Unable to ambulate Coordination and balance: Within normal limits  Right lower extremity: Leg is shortened and externally rotated.  Any pain with any attempted range of motion.  No deformity otherwise noted.  No skin lesions.  Compartments are soft and compressible.  Motor and sensory function is intact.  Warm well-perfused foot.  2+ DP pulse.  Left lower extremity skin without lesions. No tenderness to palpation. Full painless ROM, full strength in each muscle groups without evidence of instability.   Medical Decision  Making: Imaging: X-rays show a displaced right femoral neck fracture.  The pelvic ring injury appears to be healed without any interval displacement from x-rays that were obtained in my office.  Labs:  Results for orders placed or performed during the hospital encounter of 04/30/18 (from the past 48 hour(s))  Type and screen [place]     Status: None   Collection Time: 04/30/18  2:37 PM  Result Value Ref Range   ABO/RH(D) A POS    Antibody Screen NEG    Sample Expiration      05/03/2018 Performed at [facility], [place] [address] [place], [place] [address]   Basic metabolic panel     Status: Abnormal   Collection Time: 04/30/18  2:37 PM  Result Value Ref  Range   Sodium 137 135 - 145 mmol/L   Potassium 4.2 3.5 - 5.1 mmol/L   Chloride 101 98 - 111 mmol/L   CO2 26 22 - 32 mmol/L   Glucose, Bld 103 (H) 70 - 99 mg/dL   BUN 21 8 - 23 mg/dL   Creatinine, Ser 0.65 0.44 - 1.00 mg/dL   Calcium 9.3 8.9 - 10.3 mg/dL   GFR calc non Af Amer >60 >60 mL/min   GFR calc Af Amer >60 >60 mL/min    Comment: (NOTE) The eGFR has been calculated using the CKD EPI equation. This calculation has not been validated in all clinical situations. eGFR's persistently <60 mL/min signify possible Chronic Kidney Disease.    Anion gap 10 5 - 15    Comment: Performed at [facility], [place] [address] [place], [place] [address]  CBC with Differential     Status: Abnormal   Collection Time: 04/30/18  2:37 PM  Result Value Ref Range   WBC 9.6 4.0 - 10.5 K/uL   RBC 4.02 3.87 - 5.11 MIL/uL   Hemoglobin 12.8 12.0 - 15.0 g/dL   HCT 40.4 36.0 - 46.0 %   MCV 100.5 (H) 80.0 - 100.0 fL   MCH 31.8 26.0 - 34.0 pg   MCHC 31.7 30.0 - 36.0 g/dL   RDW 13.8 11.5 - 15.5 %   Platelets 272 150 - 400 K/uL   nRBC 0.0 0.0 - 0.2 %   Neutrophils Relative % 84 %   Neutro Abs 8.1 (H) 1.7 - 7.7 K/uL   Lymphocytes Relative 8 %   Lymphs Abs 0.7 0.7 - 4.0 K/uL   Monocytes  Relative 7 %   Monocytes Absolute 0.6 0.1 - 1.0 K/uL   Eosinophils Relative 0 %   Eosinophils Absolute 0.0 0.0 - 0.5 K/uL   Basophils Relative 0 %   Basophils Absolute 0.0 0.0 - 0.1 K/uL   Immature Granulocytes 1 %   Abs Immature Granulocytes 0.09 (H) 0.00 - 0.07 K/uL    Comment: Performed at [facility], [place] [address] [place], [place] [address]  ABO/Rh     Status: None   Collection Time: 04/30/18  2:37 PM  Result Value Ref Range   ABO/RH(D)      A POS Performed at [facility], [place] [address], [place], [place] [address]   Protime-INR     Status: None   Collection Time: 04/30/18  4:05 PM  Result Value Ref Range   Prothrombin Time 15.1 11.4 - 15.2 seconds   INR 1.20     Comment: Performed at [facility], [place] [address], [place], [place] [address]  Glucose, capillary     Status: Abnormal   Collection Time: 04/30/18  9:13 PM  Result Value Ref Range   Glucose-Capillary 120 (H) 70 - 99 mg/dL  Type and screen [place]     Status: None   Collection Time: 04/30/18  9:55 PM  Result Value Ref Range   ABO/RH(D) A POS    Antibody Screen NEG    Sample Expiration      05/03/2018 Performed at [facility], [place] [address], [place], [place] [address]   Surgical pcr screen     Status: None   Collection Time: 04/30/18 10:04 PM  Result Value Ref Range   MRSA, PCR NEGATIVE NEGATIVE   Staphylococcus aureus NEGATIVE NEGATIVE    Comment: (NOTE) The Xpert SA Assay (FDA approved for  NASAL specimens in patients 7 years of age and older), is one component of a comprehensive surveillance program. It is not intended to diagnose infection nor to guide or monitor treatment. Performed at [facility], [place] [address], [place], [place] [address]   Glucose, capillary     Status: Abnormal   Collection Time: 05/01/18  4:26 AM  Result Value Ref Range   Glucose-Capillary 118 (H) 70 - 99 mg/dL   Comment 1  Document in Chart   Comprehensive metabolic panel     Status: Abnormal   Collection Time: 05/01/18  5:02 AM  Result Value Ref Range   Sodium 135 135 - 145 mmol/L   Potassium 3.8 3.5 - 5.1 mmol/L   Chloride 100 98 - 111 mmol/L   CO2 28 22 - 32 mmol/L   Glucose, Bld 124 (H) 70 - 99 mg/dL   BUN 13 8 - 23 mg/dL   Creatinine, Ser 0.60 0.44 - 1.00 mg/dL   Calcium 8.9 8.9 - 10.3 mg/dL   Total Protein 6.4 (L) 6.5 - 8.1 g/dL   Albumin 3.2 (L) 3.5 - 5.0 g/dL   AST 21 15 - 41 U/L   ALT 21 0 - 44 U/L   Alkaline Phosphatase 65 38 - 126 U/L   Total Bilirubin 0.6 0.3 - 1.2 mg/dL   GFR calc non Af Amer >60 >60 mL/min   GFR calc Af Amer >60 >60 mL/min    Comment: (NOTE) The eGFR has been calculated using the CKD EPI equation. This calculation has not been validated in all clinical situations. eGFR's persistently <60 mL/min signify possible Chronic Kidney Disease.    Anion gap 7 5 - 15    Comment: Performed at [place] [address], [place], [place] [address]  Glucose, capillary     Status: Abnormal   Collection Time: 05/01/18  8:07 AM  Result Value Ref Range   Glucose-Capillary 109 (H) 70 - 99 mg/dL  Glucose, capillary     Status: Abnormal   Collection Time: 05/01/18 12:01 PM  Result Value Ref Range   Glucose-Capillary 115 (H) 70 - 99 mg/dL    Medical history and chart was reviewed  Assessment/Plan: 84 year old female with a history of A. fib and RVR with previous pelvic ring injury that is now healed now presents with a displaced right femoral neck fracture.  Patient will need to undergo a right hip hemiarthroplasty.  I discussed risks and benefits with the patient and her daughter.  Unfortunately she her last dose was yesterday morning of her Eliquis.  I feel that she needs at least 48 hours prior to proceeding with hip hemiarthroplasty.  I discussed the increased risks of pursuing surgery on anticoagulation including bleeding, hematoma formation, high risk of infection,  nerve or blood vessel injury, and the even the loss of life.  In light of these risks they wish to proceed with surgery but will do it on a delayed fashion.  Will place on the surgery schedule on 05/03/2018 in the morning.   Shona Needles, MD [facility] [phone] (phone)

## 2018-05-01 NOTE — Consult Note (Signed)
Orthopaedic Trauma Service (OTS) Consult   Patient ID: Tracey Porter MRN: [id number] DOB/AGE: 02/08/1930 84 y.o.  Reason for Consult:Right femoral neck fracture Referring Physician: Constin Gherghe, MD [facility]  HPI: Tracey Porter is an 84 y.o. female who is being seen in consultation at the request of Dr. Gherghe for evaluation of right femoral neck fracture.  This patient is well-known to me from last year when she had sustained a complex pelvic ring injury.  We planned on treating her nonoperatively.  Unfortunately she went on to a delayed union but eventually was subsequently doing well and ambulating at her prior capabilities.  She had fallen and slipped down and landed on her hip and had immediate pain deformity and shortening of the leg.  She is brought to the emergency room where x-rays showed a right femoral neck fracture.  I was asked to consult due to her pre-existing relationship and asked that she be transferred over to [facility] for possible surgical management.  Patient has a history of A. fib with RVR she is on Eliquis.  Her last dose was yesterday morning.  She takes it twice a day.  She has no other issues of note.  Her pain in her pelvis has resolved.  Past Medical History:  Diagnosis Date  . Diabetes mellitus without complication ([facility])   . Hypertension     Past Surgical History:  Procedure Laterality Date  . ABDOMINAL HYSTERECTOMY  1976  . EYE SURGERY      Family History  Problem Relation Age of Onset  . Lung cancer Father   . Lymphoma Daughter     Social History:  reports that she has quit smoking. She has never used smokeless tobacco. She reports that she drinks alcohol. She reports that she does not use drugs.  Allergies:  Allergies  Allergen Reactions  . Penicillins Rash    Medications:  No current facility-administered medications on file prior to encounter.    Current Outpatient Medications on File Prior to Encounter  Medication  Sig Dispense Refill  . acyclovir (ZOVIRAX) 800 MG tablet Take 800 mg by mouth 2 (two) times daily.    . amiodarone (PACERONE) 200 MG tablet Take 1 tablet (200 mg total) by mouth daily. (Patient taking differently: Take 200 mg by mouth 2 (two) times daily. ) 90 tablet 3  . apixaban (ELIQUIS) 5 MG TABS tablet Take 1 tablet (5 mg total) by mouth 2 (two) times daily. Please cancel previous rx's sent; call pt with price before filling 180 tablet 3  . Cholecalciferol (D-3-5) 5000 units capsule Take 5,000 Units by mouth daily.    . GLUCOSAMINE-CHONDROIT-VIT C-MN PO Take 1 tablet by mouth daily.    . magnesium chloride (SLOW-MAG) 64 MG TBEC SR tablet Take 1 tablet by mouth 2 (two) times daily.    . metoprolol succinate (TOPROL XL) 25 MG 24 hr tablet Take 0.5 tablets (12.5 mg total) by mouth at bedtime. 30 tablet 6  . Multiple Vitamins-Minerals (ICAPS PLUS) TABS Take 1 tablet by mouth 2 (two) times daily.    . MYRBETRIQ 25 MG TB24 tablet Take 25 mg by mouth daily.    . nitroGLYCERIN (NITROSTAT) 0.4 MG SL tablet Place 0.4 mg under the tongue every 5 (five) minutes as needed for chest pain.    . pantoprazole (PROTONIX) 40 MG tablet Take 40 mg by mouth daily.    . Polyethyl Glycol-Propyl Glycol (SYSTANE) 0.4-0.3 % SOLN Apply 1 drop to eye 2 (two) times daily.    .   prednisoLONE acetate (PRED FORTE) 1 % ophthalmic suspension Place 1 drop into the right eye 4 (four) times daily.     . Probiotic Product (PROBIOTIC PO) Take 1 capsule by mouth daily.    . Teriparatide, Recombinant, (FORTEO [place]) Inject 20 mcg daily into the skin.    . timolol (BETIMOL) 0.5 % ophthalmic solution Place 1 drop into both eyes 2 (two) times daily.    . vitamin B-12 (CYANOCOBALAMIN) 1000 MCG tablet Take 1,000 mcg by mouth daily.    . vitamin C (ASCORBIC ACID) 500 MG tablet Take 500 mg by mouth 2 (two) times daily.    . cephALEXin (KEFLEX) 500 MG capsule Take 1 capsule (500 mg total) by mouth 3 (three) times daily. (Patient not taking:  Reported on 04/30/2018) 40 capsule 0  . diltiazem (CARDIZEM CD) 360 MG 24 hr capsule TAKE 1 CAPSULE BY MOUTH  DAILY (Patient not taking: Reported on 04/30/2018) 90 capsule 3  . ibuprofen (ADVIL,MOTRIN) 600 MG tablet Take 1 tablet (600 mg total) by mouth every 6 (six) hours as needed. (Patient taking differently: Take 600 mg by mouth every 6 (six) hours as needed for moderate pain. ) 30 tablet 0  . labetalol (NORMODYNE) 200 MG tablet TAKE 1.5 TABLETS BY MOUTH  EVERY MORNING AND TAKE 1  TABLET BY MOUTH BY MOUTH  EVERY EVENING. (Patient not taking: Reported on 04/30/2018) 225 tablet 3  . loperamide (IMODIUM) 2 MG capsule Take 1 capsule (2 mg total) by mouth as needed for diarrhea or loose stools. 30 capsule 0    ROS: Constitutional: No fever or chills Vision: No changes in vision ENT: No difficulty swallowing CV: No chest pain Pulm: No SOB or wheezing GI: No nausea or vomiting GU: No urgency or inability to hold urine Skin: No poor wound healing Neurologic: No numbness or tingling Psychiatric: No depression or anxiety Heme: No bruising Allergic: No reaction to medications or food   Exam: Blood pressure (!) 124/58, pulse 69, temperature 98.2 F (36.8 C), temperature source Axillary, resp. rate 17, height 5' (1.524 m), weight 61.2 kg, SpO2 98 %. General: No acute distress Orientation: Awake alert and oriented x3 Mood and Affect: Cooperative and pleasant Gait: Unable to ambulate Coordination and balance: Within normal limits  Right lower extremity: Leg is shortened and externally rotated.  Any pain with any attempted range of motion.  No deformity otherwise noted.  No skin lesions.  Compartments are soft and compressible.  Motor and sensory function is intact.  Warm well-perfused foot.  2+ DP pulse.  Left lower extremity skin without lesions. No tenderness to palpation. Full painless ROM, full strength in each muscle groups without evidence of instability.   Medical Decision  Making: Imaging: X-rays show a displaced right femoral neck fracture.  The pelvic ring injury appears to be healed without any interval displacement from x-rays that were obtained in my office.  Labs:  Results for orders placed or performed during the hospital encounter of 04/30/18 (from the past 48 hour(s))  Type and screen [facility]     Status: None   Collection Time: 04/30/18  2:37 PM  Result Value Ref Range   ABO/RH(D) A POS    Antibody Screen NEG    Sample Expiration      05/03/2018 Performed at  [facility], [address] [place], [place] [address]   Basic metabolic panel     Status: Abnormal   Collection Time: 04/30/18  2:37 PM  Result Value Ref   Range   Sodium 137 135 - 145 mmol/L   Potassium 4.2 3.5 - 5.1 mmol/L   Chloride 101 98 - 111 mmol/L   CO2 26 22 - 32 mmol/L   Glucose, Bld 103 (H) 70 - 99 mg/dL   BUN 21 8 - 23 mg/dL   Creatinine, Ser 0.65 0.44 - 1.00 mg/dL   Calcium 9.3 8.9 - 10.3 mg/dL   GFR calc non Af Amer >60 >60 mL/min   GFR calc Af Amer >60 >60 mL/min    Comment: (NOTE) The eGFR has been calculated using the CKD EPI equation. This calculation has not been validated in all clinical situations. eGFR's persistently <60 mL/min signify possible Chronic Kidney Disease.    Anion gap 10 5 - 15    Comment: Performed at [facility], [address] [place], [place] [address]  CBC with Differential     Status: Abnormal   Collection Time: 04/30/18  2:37 PM  Result Value Ref Range   WBC 9.6 4.0 - 10.5 K/uL   RBC 4.02 3.87 - 5.11 MIL/uL   Hemoglobin 12.8 12.0 - 15.0 g/dL   HCT 40.4 36.0 - 46.0 %   MCV 100.5 (H) 80.0 - 100.0 fL   MCH 31.8 26.0 - 34.0 pg   MCHC 31.7 30.0 - 36.0 g/dL   RDW 13.8 11.5 - 15.5 %   Platelets 272 150 - 400 K/uL   nRBC 0.0 0.0 - 0.2 %   Neutrophils Relative % 84 %   Neutro Abs 8.1 (H) 1.7 - 7.7 K/uL   Lymphocytes Relative 8 %   Lymphs Abs 0.7 0.7 - 4.0 K/uL   Monocytes  Relative 7 %   Monocytes Absolute 0.6 0.1 - 1.0 K/uL   Eosinophils Relative 0 %   Eosinophils Absolute 0.0 0.0 - 0.5 K/uL   Basophils Relative 0 %   Basophils Absolute 0.0 0.0 - 0.1 K/uL   Immature Granulocytes 1 %   Abs Immature Granulocytes 0.09 (H) 0.00 - 0.07 K/uL    Comment: Performed at [facility], [address] [place], [place] [address]  ABO/Rh     Status: None   Collection Time: 04/30/18  2:37 PM  Result Value Ref Range   ABO/RH(D)      A POS Performed at [facility], [address] [place], [place] [address]   Protime-INR     Status: None   Collection Time: 04/30/18  4:05 PM  Result Value Ref Range   Prothrombin Time 15.1 11.4 - 15.2 seconds   INR 1.20     Comment: Performed at [facility], [address] [place], [place] [address]  Glucose, capillary     Status: Abnormal   Collection Time: 04/30/18  9:13 PM  Result Value Ref Range   Glucose-Capillary 120 (H) 70 - 99 mg/dL  Type and screen [facility]     Status: None   Collection Time: 04/30/18  9:55 PM  Result Value Ref Range   ABO/RH(D) A POS    Antibody Screen NEG    Sample Expiration      05/03/2018 Performed at [facility], [address] ,  [address]   Surgical pcr screen     Status: None   Collection Time: 04/30/18 10:04 PM  Result Value Ref Range   MRSA, PCR NEGATIVE NEGATIVE   Staphylococcus aureus NEGATIVE NEGATIVE    Comment: (NOTE) The Xpert SA Assay (FDA approved for   NASAL specimens in patients 22 years of age and older), is one component of a comprehensive surveillance program. It is not intended to diagnose infection nor to guide or monitor treatment. Performed at [facility], [address] [place], [place] [address]   Glucose, capillary     Status: Abnormal   Collection Time: 05/01/18  4:26 AM  Result Value Ref Range   Glucose-Capillary 118 (H) 70 - 99 mg/dL   Comment 1  Document in Chart   Comprehensive metabolic panel     Status: Abnormal   Collection Time: 05/01/18  5:02 AM  Result Value Ref Range   Sodium 135 135 - 145 mmol/L   Potassium 3.8 3.5 - 5.1 mmol/L   Chloride 100 98 - 111 mmol/L   CO2 28 22 - 32 mmol/L   Glucose, Bld 124 (H) 70 - 99 mg/dL   BUN 13 8 - 23 mg/dL   Creatinine, Ser 0.60 0.44 - 1.00 mg/dL   Calcium 8.9 8.9 - 10.3 mg/dL   Total Protein 6.4 (L) 6.5 - 8.1 g/dL   Albumin 3.2 (L) 3.5 - 5.0 g/dL   AST 21 15 - 41 U/L   ALT 21 0 - 44 U/L   Alkaline Phosphatase 65 38 - 126 U/L   Total Bilirubin 0.6 0.3 - 1.2 mg/dL   GFR calc non Af Amer >60 >60 mL/min   GFR calc Af Amer >60 >60 mL/min    Comment: (NOTE) The eGFR has been calculated using the CKD EPI equation. This calculation has not been validated in all clinical situations. eGFR's persistently <60 mL/min signify possible Chronic Kidney Disease.    Anion gap 7 5 - 15    Comment: Performed at [facility], [address] [place], [place] [address]  Glucose, capillary     Status: Abnormal   Collection Time: 05/01/18  8:07 AM  Result Value Ref Range   Glucose-Capillary 109 (H) 70 - 99 mg/dL  Glucose, capillary     Status: Abnormal   Collection Time: 05/01/18 12:01 PM  Result Value Ref Range   Glucose-Capillary 115 (H) 70 - 99 mg/dL    Medical history and chart was reviewed  Assessment/Plan: 88-year-old female with a history of A. fib and RVR with previous pelvic ring injury that is now healed now presents with a displaced right femoral neck fracture.  Patient will need to undergo a right hip hemiarthroplasty.  I discussed risks and benefits with the patient and her daughter.  Unfortunately she her last dose was yesterday morning of her Eliquis.  I feel that she needs at least 48 hours prior to proceeding with hip hemiarthroplasty.  I discussed the increased risks of pursuing surgery on anticoagulation including bleeding, hematoma formation, high risk of infection,  nerve or blood vessel injury, and the even the loss of life.  In light of these risks they wish to proceed with surgery but will do it on a delayed fashion.  Will place on the surgery schedule on 05/03/2018 in the morning.   [place] Hy P. Raylie Maddison, MD [facility] [phone] (phone)   

## 2018-05-01 NOTE — Progress Notes (Signed)
Report given to [place], Charity fundraiser.  Pt not having surgery today due to last dose of Eliquis yesterday AM.  Pt to be transferred back to [facility].

## 2018-05-02 DIAGNOSIS — I480 Paroxysmal atrial fibrillation: Secondary | ICD-10-CM

## 2018-05-02 DIAGNOSIS — I5032 Chronic diastolic (congestive) heart failure: Secondary | ICD-10-CM

## 2018-05-02 DIAGNOSIS — I10 Essential (primary) hypertension: Secondary | ICD-10-CM

## 2018-05-02 DIAGNOSIS — E785 Hyperlipidemia, unspecified: Secondary | ICD-10-CM

## 2018-05-02 DIAGNOSIS — Z7901 Long term (current) use of anticoagulants: Secondary | ICD-10-CM

## 2018-05-02 DIAGNOSIS — S72001K Fracture of unspecified part of neck of right femur, subsequent encounter for closed fracture with nonunion: Secondary | ICD-10-CM

## 2018-05-02 DIAGNOSIS — E119 Type 2 diabetes mellitus without complications: Secondary | ICD-10-CM

## 2018-05-02 LAB — GLUCOSE, CAPILLARY
GLUCOSE-CAPILLARY: 142 mg/dL — AB (ref 70–99)
GLUCOSE-CAPILLARY: 139 mg/dL — AB (ref 70–99)
Glucose-Capillary: 103 mg/dL — ABNORMAL HIGH (ref 70–99)
Glucose-Capillary: 115 mg/dL — ABNORMAL HIGH (ref 70–99)

## 2018-05-02 NOTE — Care Management (Signed)
Patient admitted s/p fall. Resides at [facility]. CM and SW will continue to follow patient for disposition. Patient will have surgery on 05/03/2018.

## 2018-05-02 NOTE — Progress Notes (Signed)
Orthopaedic Trauma Progress Note  S: Resting and sleeping comfortably. No family at bedside  O:  Vitals:   05/01/18 2208 05/02/18 0343  BP: (!) 113/58 (!) 124/51  Pulse: 82 72  Resp:  13  Temp:  98.3 F (36.8 C)  SpO2:  97%   No changes from yesterday exam  Imaging: No new imaging  Labs:  Results for orders placed or performed during the hospital encounter of 04/30/18 (from the past 24 hour(s))  Glucose, capillary     Status: Abnormal   Collection Time: 05/01/18  8:07 AM  Result Value Ref Range   Glucose-Capillary 109 (H) 70 - 99 mg/dL  Glucose, capillary     Status: Abnormal   Collection Time: 05/01/18 12:01 PM  Result Value Ref Range   Glucose-Capillary 115 (H) 70 - 99 mg/dL  Glucose, capillary     Status: Abnormal   Collection Time: 05/01/18  4:41 PM  Result Value Ref Range   Glucose-Capillary 111 (H) 70 - 99 mg/dL  Glucose, capillary     Status: Abnormal   Collection Time: 05/01/18  9:27 PM  Result Value Ref Range   Glucose-Capillary 147 (H) 70 - 99 mg/dL  Glucose, capillary     Status: Abnormal   Collection Time: 05/02/18  7:18 AM  Result Value Ref Range   Glucose-Capillary 103 (H) 70 - 99 mg/dL    Assessment: 84 year old female s/p fall  Injuries: Right femoral neck fracture  Weightbearing: Bedrest for now before surgery  Insicional and dressing care: None  Orthopedic device(s):None  Plan for OR tomorrow AM for hemiarthroplasty. NPO past midnight  CV/Blood loss:Stable, repeat Hgb tomorrow, type and cross 2 units for surgery  Pain management: 1. Norco 1-2 tabs q 6 hours PRN 2. Morphine 2 mg q 4 hours PRN  VTE prophylaxis: Eliquis being held preoperatively  ID: None needed currently  Foley/Lines: KVO IVF  Medical co-morbidities: A fib-Eliquis being held, restart POD1 on Thurs 2. Diabetes-SSI  Dispo: TBD, likely SNF  Follow - up plan: TBD   Roby Lofts, MD [facility] [phone] (phone)

## 2018-05-02 NOTE — Progress Notes (Signed)
PT Cancellation Note  Patient Details Name: Tracey Porter MRN: [id number] DOB: 12/29/1929   Cancelled Treatment:    Reason Eval/Treat Not Completed: Other (comment);Active bedrest order(noted order from internal medicine, pt for OR tomorrow with ortho placing pt on bedrest. will sign off and await an order post op)   [name] B [name] 05/02/2018, 11:55 AM  Delaney Meigs, PT Acute Rehabilitation Services Pager: [phone] Office: [phone]

## 2018-05-02 NOTE — Plan of Care (Signed)

## 2018-05-02 NOTE — Progress Notes (Signed)
PROGRESS NOTE    Tracey Porter  ZOX:[id number] DOB: [birth year] DOA: 04/30/2018 PCP: Florentina Jenny, MD   Brief Narrative:  84 year old with history of essential hypertension, hyperlipidemia, paroxysmal atrial fibrillation on Eliquis, diastolic CHF, paroxysmal SVT came to the hospital with right hip pain.  She was found to have acute right subcapital femoral neck fracture with old inferior pubic rami fracture.  Orthopedic was consulted.  Her Eliquis was held.   Assessment & Plan:   Principal Problem:   Hip fracture (HCC) Active Problems:   Non-insulin treated type 2 diabetes mellitus (HCC)   Paroxysmal SVT (supraventricular tachycardia) (HCC)   Essential hypertension   Chronic anticoagulation   Chronic diastolic heart failure (HCC)   PAF (paroxysmal atrial fibrillation) (HCC)   Dyslipidemia  Displaced right hip fracture - Holding Eliquis at this time.  Plans for right hip arthroplasty tomorrow 05/03/2018 per orthopedic - Postoperatively will require PT/OT -Pain control, incentive spirometry - Chemoprophylaxis per surgery  Paroxysmal atrial fibrillation -Continue patient's amiodarone and beta-blocker.  Eliquis is on hold.  If surgery is delayed any longer than tomorrow, will start patient on heparin drip  Diet-controlled diabetes mellitus type 2 -Most recent hemoglobin A1c 6.2.  Continue Accu-Cheks and sliding scale  History of GERD -Continue Protonix 40 mg daily  Essential hypertension - Continue Toprol-XL.   DVT prophylaxis: SCDs Code Status: Full code Family Communication: Spoke over the phone with the patient's daughter, Victorino Dike Disposition Plan: Maintain inpatient stay for surgical intervention which is planned tomorrow.  Consultants:   Orthopedic  Procedures:   None so far  Antimicrobials:   None   Subjective: States her pain is slightly better today but has not moved around much yet.  She is afraid moving around will cause her pain to come  back.  Review of Systems Otherwise negative except as per HPI, including: General: Denies fever, chills, night sweats or unintended weight loss. Resp: Denies cough, wheezing, shortness of breath. Cardiac: Denies chest pain, palpitations, orthopnea, paroxysmal nocturnal dyspnea. GI: Denies abdominal pain, nausea, vomiting, diarrhea or constipation GU: Denies dysuria, frequency, hesitancy or incontinence MS: Denies muscle aches, joint pain or swelling Neuro: Denies headache, neurologic deficits (focal weakness, numbness, tingling), abnormal gait Psych: Denies anxiety, depression, SI/HI/AVH Skin: Denies new rashes or lesions ID: Denies sick contacts, exotic exposures, travel  Objective: Vitals:   05/01/18 0600 05/01/18 2031 05/01/18 2208 05/02/18 0343  BP:  (!) 125/53 (!) 113/58 (!) 124/51  Pulse:  74 82 72  Resp:  18  13  Temp:  99 F (37.2 C)  98.3 F (36.8 C)  TempSrc:  Oral  Oral  SpO2: 98% 100%  97%  Weight:      Height:        Intake/Output Summary (Last 24 hours) at 05/02/2018 1124 Last data filed at 05/02/2018 0830 Gross per 24 hour  Intake 480 ml  Output -  Net 480 ml   Filed Weights   04/30/18 1931 04/30/18 2300  Weight: 68 kg 61.2 kg    Examination:  General exam: Appears calm and comfortable  Respiratory system: Clear to auscultation. Respiratory effort normal. Cardiovascular system: S1 & S2 heard, RRR. No JVD, murmurs, rubs, gallops or clicks. No pedal edema. Gastrointestinal system: Abdomen is nondistended, soft and nontender. No organomegaly or masses felt. Normal bowel sounds heard. Central nervous system: Alert and oriented. No focal neurological deficits. Extremities: Limited range of motion of her right lower extremity secondary to the hip fracture. Skin: No rashes, lesions or ulcers Psychiatry: Judgement  and insight appear normal. Mood & affect appropriate.     Data Reviewed:   CBC: Recent Labs  Lab 04/30/18 1437  WBC 9.6  NEUTROABS 8.1*   HGB 12.8  HCT 40.4  MCV 100.5*  PLT 272   Basic Metabolic Panel: Recent Labs  Lab 04/30/18 1437 05/01/18 0502  NA 137 135  K 4.2 3.8  CL 101 100  CO2 26 28  GLUCOSE 103* 124*  BUN 21 13  CREATININE 0.65 0.60  CALCIUM 9.3 8.9   GFR: Estimated Creatinine Clearance: 41.3 mL/min (by C-G formula based on SCr of 0.6 mg/dL). Liver Function Tests: Recent Labs  Lab 05/01/18 0502  AST 21  ALT 21  ALKPHOS 65  BILITOT 0.6  PROT 6.4*  ALBUMIN 3.2*   No results for input(s): LIPASE, AMYLASE in the last 168 hours. No results for input(s): AMMONIA in the last 168 hours. Coagulation Profile: Recent Labs  Lab 04/30/18 1605  INR 1.20   Cardiac Enzymes: No results for input(s): CKTOTAL, CKMB, CKMBINDEX, TROPONINI in the last 168 hours. BNP (last 3 results) No results for input(s): PROBNP in the last 8760 hours. HbA1C: No results for input(s): HGBA1C in the last 72 hours. CBG: Recent Labs  Lab 05/01/18 0807 05/01/18 1201 05/01/18 1641 05/01/18 2127 05/02/18 0718  GLUCAP 109* 115* 111* 147* 103*   Lipid Profile: No results for input(s): CHOL, HDL, LDLCALC, TRIG, CHOLHDL, LDLDIRECT in the last 72 hours. Thyroid Function Tests: No results for input(s): TSH, T4TOTAL, FREET4, T3FREE, THYROIDAB in the last 72 hours. Anemia Panel: No results for input(s): VITAMINB12, FOLATE, FERRITIN, TIBC, IRON, RETICCTPCT in the last 72 hours. Sepsis Labs: No results for input(s): PROCALCITON, LATICACIDVEN in the last 168 hours.  Recent Results (from the past 240 hour(s))  Surgical pcr screen     Status: None   Collection Time: 04/30/18 10:04 PM  Result Value Ref Range Status   MRSA, PCR NEGATIVE NEGATIVE Final   Staphylococcus aureus NEGATIVE NEGATIVE Final    Comment: (NOTE) The Xpert SA Assay (FDA approved for NASAL specimens in patients 50 years of age and older), is one component of a comprehensive surveillance program. It is not intended to diagnose infection nor to guide or  monitor treatment. Performed at [facility], [address], [place], [place] [address]          Radiology Studies: Ct Head Wo Contrast  Result Date: 04/30/2018 CLINICAL DATA:  Posttraumatic headache EXAM: CT HEAD WITHOUT CONTRAST TECHNIQUE: Contiguous axial images were obtained from the base of the skull through the vertex without intravenous contrast. COMPARISON:  03/29/2017 FINDINGS: Brain: No evidence of acute infarction, hemorrhage, hydrocephalus, extra-axial collection or mass lesion/mass effect. Generalized atrophy is mild to moderate for age. Mild chronic small vessel ischemic change in the cerebral white matter Vascular: No hyperdense vessel or unexpected calcification. Skull: Negative for fracture Sinuses/Orbits: No evidence of injury IMPRESSION: No evidence of intracranial injury. Electronically Signed   By: Marnee Spring M.D.   On: 04/30/2018 15:40   Dg Hip Unilat  With Pelvis 2-3 Views Right  Result Date: 04/30/2018 CLINICAL DATA:  Slid out of chair. Fall. Right hip pain. Deformity to the right lower extremity. EXAM: DG HIP (WITH OR WITHOUT PELVIS) 2-3V RIGHT COMPARISON:  None. FINDINGS: A right subcapital femoral neck fracture is displaced superiorly with varus angulation. Bilateral superior and inferior pubic rami fractures appear remote. Moderate osteopenia is present. No additional fractures are present. IMPRESSION: 1. Acute right subcapital femoral neck fracture.  2. Remote bilateral superior and inferior pubic rami fractures. Electronically Signed   By: Marin Roberts M.D.   On: 04/30/2018 14:37        Scheduled Meds: . acidophilus  1 capsule Oral Daily  . acyclovir  800 mg Oral BID  . amiodarone  200 mg Oral BID  . insulin aspart  0-9 Units Subcutaneous TID WC  . metoprolol succinate  12.5 mg Oral QHS  . mirabegron ER  25 mg Oral Daily  . pantoprazole  40 mg Oral Daily  . polyvinyl alcohol  1 drop Both Eyes BID  . prednisoLONE acetate  1 drop Right  Eye QID  . timolol  1 drop Both Eyes BID  . vitamin B-12  1,000 mcg Oral Daily   Continuous Infusions: . sodium chloride 250 mL (04/30/18 2313)     LOS: 2 days   Time spent= 22 mins    Tyrone Pautsch Joline Maxcy, MD [facility] Pager [phone]   If 7PM-7AM, please contact night-coverage [email or web address] Password [facility] 05/02/2018, 11:24 AM

## 2018-05-02 NOTE — Anesthesia Preprocedure Evaluation (Addendum)
Anesthesia Evaluation  Patient identified by MRN, date of birth, ID band Patient awake    Reviewed: Allergy & Precautions, NPO status , Patient's Chart, lab work & pertinent test results  Airway Mallampati: II  TM Distance: >3 FB Neck ROM: Full    Dental  (+) Dental Advisory Given   Pulmonary former smoker,    breath sounds clear to auscultation       Cardiovascular hypertension, Pt. on medications and Pt. on home beta blockers + dysrhythmias Atrial Fibrillation  Rhythm:Regular Rate:Normal     Neuro/Psych negative neurological ROS     GI/Hepatic negative GI ROS, Neg liver ROS,   Endo/Other  diabetes, Type 2  Renal/GU negative Renal ROS     Musculoskeletal   Abdominal   Peds  Hematology negative hematology ROS (+)   Anesthesia Other Findings   Reproductive/Obstetrics                            Lab Results  Component Value Date   WBC 9.6 04/30/2018   HGB 12.8 04/30/2018   HCT 40.4 04/30/2018   MCV 100.5 (H) 04/30/2018   PLT 272 04/30/2018   Lab Results  Component Value Date   CREATININE 0.60 05/01/2018   BUN 13 05/01/2018   NA 135 05/01/2018   K 3.8 05/01/2018   CL 100 05/01/2018   CO2 28 05/01/2018    Anesthesia Physical Anesthesia Plan  ASA: III  Anesthesia Plan: General   Post-op Pain Management:    Induction: Intravenous  PONV Risk Score and Plan: 3 and Ondansetron, Dexamethasone and Treatment may vary due to age or medical condition  Airway Management Planned: Oral ETT  Additional Equipment:   Intra-op Plan:   Post-operative Plan: Extubation in OR  Informed Consent: I have reviewed the patients History and Physical, chart, labs and discussed the procedure including the risks, benefits and alternatives for the proposed anesthesia with the patient or authorized representative who has indicated his/her understanding and acceptance.   Dental advisory  given  Plan Discussed with: CRNA  Anesthesia Plan Comments:        Anesthesia Quick Evaluation

## 2018-05-02 NOTE — Progress Notes (Signed)
OT Cancellation Note  Patient Details Name: Tracey Porter MRN: [id number] DOB: 1930/02/12   Cancelled Treatment:    Reason Eval/Treat Not Completed: Patient not medically ready;Active bedrest order(Planning for OR tomorrow. Will return for OT evaluation as schedule allows and pt medically ready.)  Wrenna Saks M Andreka Stucki Venda Dice MSOT, OTR/L Acute Rehab Pager: [phone] Office: [phone] 05/02/2018, 2:40 PM

## 2018-05-03 ENCOUNTER — Encounter (HOSPITAL_COMMUNITY)
Admission: EM | Disposition: A | Discharge status: Skilled Nursing Facility | Payer: Self-pay | Source: Skilled Nursing Facility | Attending: Internal Medicine

## 2018-05-03 ENCOUNTER — Inpatient Hospital Stay (HOSPITAL_COMMUNITY): Payer: Medicare Other | Admitting: Anesthesiology

## 2018-05-03 ENCOUNTER — Encounter (HOSPITAL_COMMUNITY): Payer: Self-pay | Admitting: Certified Registered Nurse Anesthetist

## 2018-05-03 ENCOUNTER — Inpatient Hospital Stay (HOSPITAL_COMMUNITY): Payer: Medicare Other

## 2018-05-03 HISTORY — PX: HIP ARTHROPLASTY: SHX981

## 2018-05-03 LAB — CBC
HCT: 33.8 % — ABNORMAL LOW (ref 36.0–46.0)
Hemoglobin: 11.1 g/dL — ABNORMAL LOW (ref 12.0–15.0)
MCH: 32 pg (ref 26.0–34.0)
MCHC: 32.8 g/dL (ref 30.0–36.0)
MCV: 97.4 fL (ref 80.0–100.0)
NRBC: 0 % (ref 0.0–0.2)
Platelets: 243 10*3/uL (ref 150–400)
RBC: 3.47 MIL/uL — ABNORMAL LOW (ref 3.87–5.11)
RDW: 13.4 % (ref 11.5–15.5)
WBC: 12.2 10*3/uL — AB (ref 4.0–10.5)

## 2018-05-03 LAB — GLUCOSE, CAPILLARY
GLUCOSE-CAPILLARY: 113 mg/dL — AB (ref 70–99)
GLUCOSE-CAPILLARY: 150 mg/dL — AB (ref 70–99)
Glucose-Capillary: 99 mg/dL (ref 70–99)
Glucose-Capillary: 177 mg/dL — ABNORMAL HIGH (ref 70–99)

## 2018-05-03 LAB — BASIC METABOLIC PANEL
ANION GAP: 7 (ref 5–15)
BUN: 18 mg/dL (ref 8–23)
CALCIUM: 8.6 mg/dL — AB (ref 8.9–10.3)
CO2: 27 mmol/L (ref 22–32)
Chloride: 99 mmol/L (ref 98–111)
Creatinine, Ser: 0.71 mg/dL (ref 0.44–1.00)
GFR calc non Af Amer: >60 mL/min (ref >60)
GLUCOSE: 120 mg/dL — AB (ref 70–99)
Potassium: 4 mmol/L (ref 3.5–5.1)
Sodium: 133 mmol/L — ABNORMAL LOW (ref 135–145)

## 2018-05-03 LAB — MAGNESIUM: Magnesium: 1.8 mg/dL (ref 1.7–2.4)

## 2018-05-03 IMAGING — DX DG PORTABLE PELVIS
1 series · 2 of 2 positions shown · non-contrast
Comparison: 03/23/2017

CLINICAL DATA: Right hip hemiarthroplasty.

EXAM:
PORTABLE PELVIS 1-2 VIEWS

[Series 1: pelvis · 0.14mm/px · 2 of 2 slices shown]
[im 1/2]
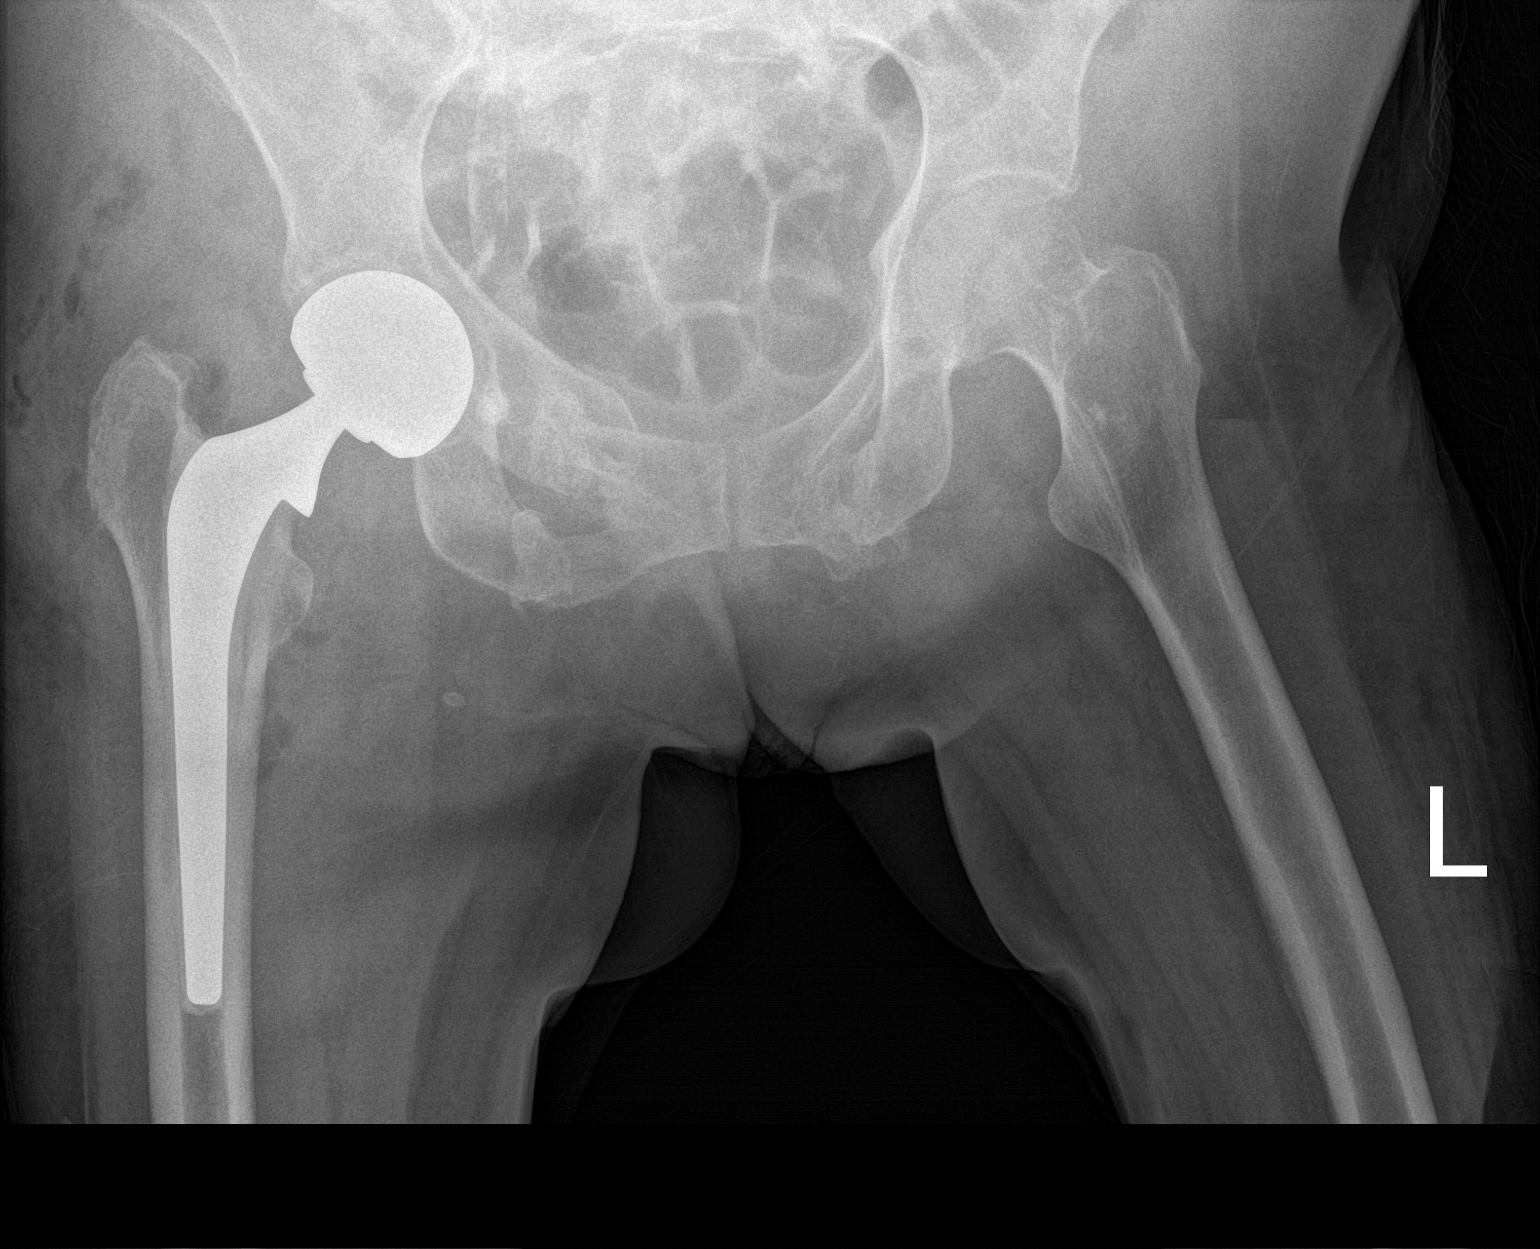
[im 2/2]
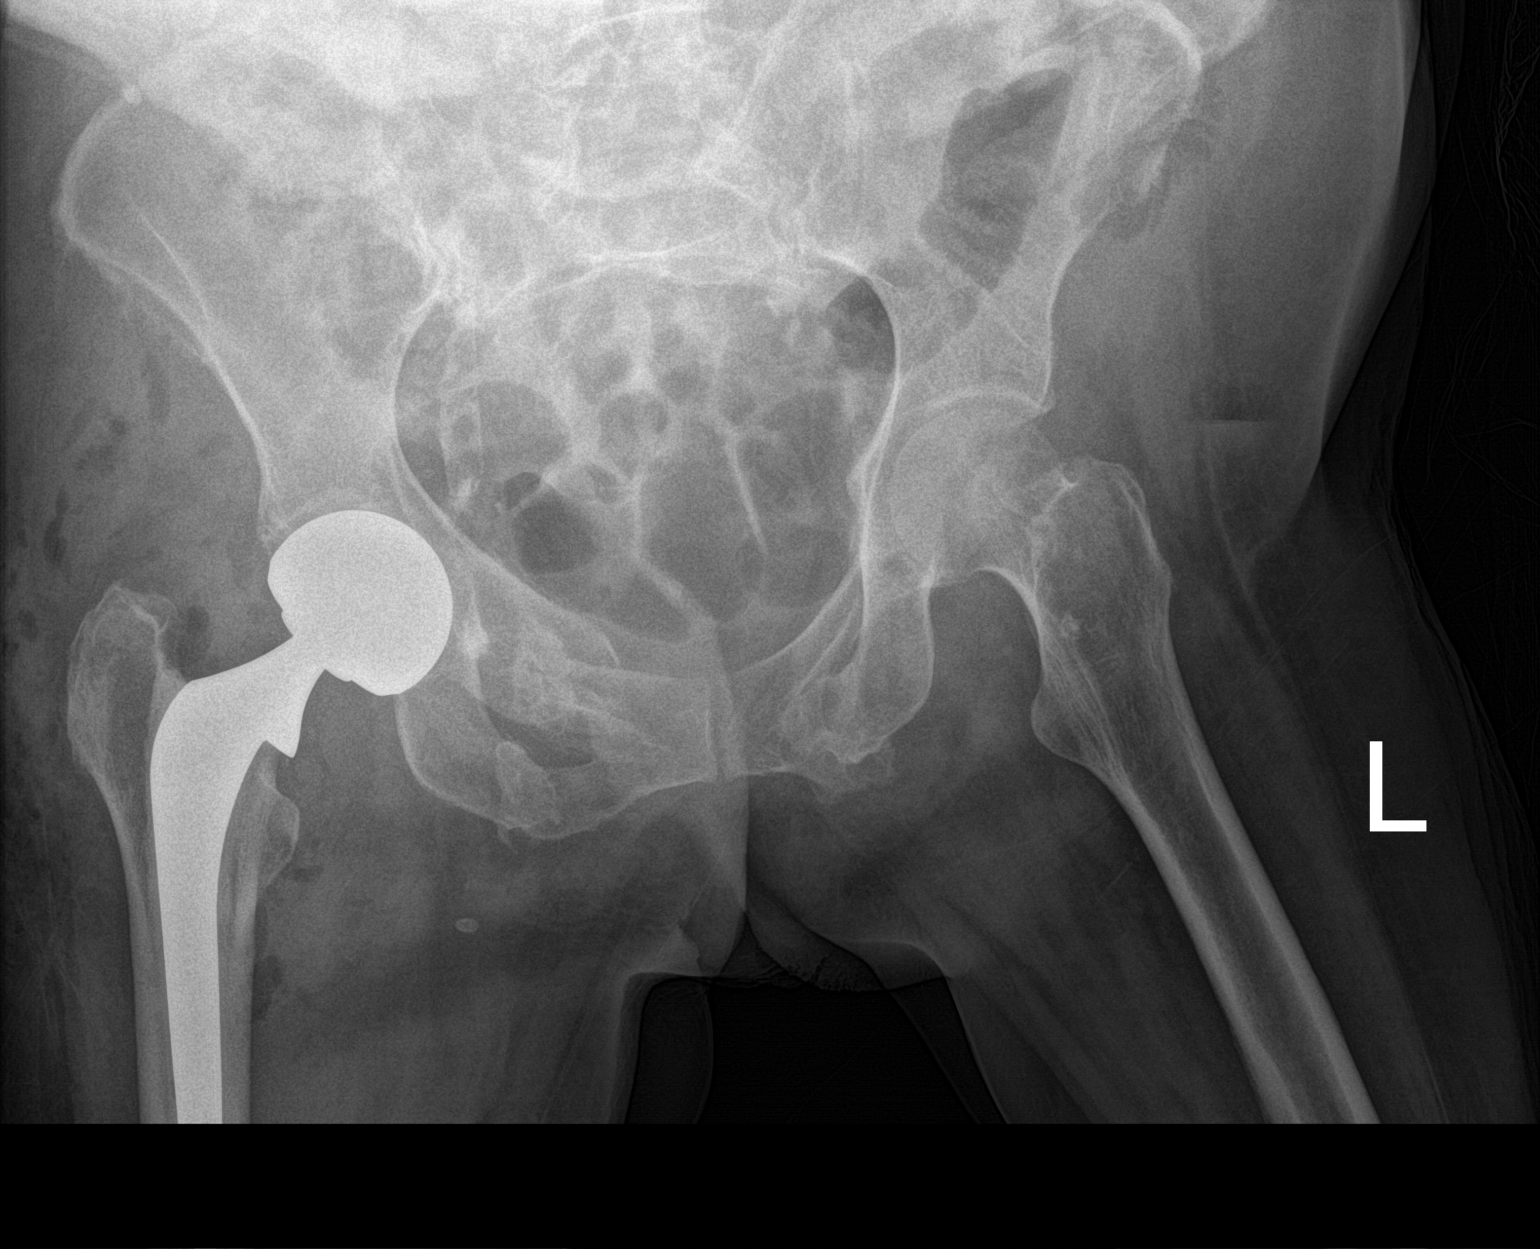

[2 of 2 positions shown; findings below may reference images not displayed]

FINDINGS: New right hip hemiarthroplasty appears well-seated and aligned.

No acute fracture or evidence of an operative complication. Old
pubic rami fractures bilaterally.
IMPRESSION: 1. Well-positioned right hip hemiarthroplasty.

## 2018-05-03 SURGERY — HEMIARTHROPLASTY, HIP, DIRECT ANTERIOR APPROACH, FOR FRACTURE
Anesthesia: General | Site: Hip | Laterality: Right

## 2018-05-03 MED ORDER — ONDANSETRON HCL 4 MG/2ML IJ SOLN
INTRAMUSCULAR | Status: DC | PRN
  Administered 2018-05-03: 4 mg via INTRAVENOUS

## 2018-05-03 MED ORDER — CEFAZOLIN SODIUM-DEXTROSE 2-3 GM-%(50ML) IV SOLR
INTRAVENOUS | Status: DC | PRN
  Administered 2018-05-03: 2 g via INTRAVENOUS

## 2018-05-03 MED ORDER — VANCOMYCIN HCL 1000 MG IV SOLR
INTRAVENOUS | Status: DC | PRN
  Administered 2018-05-03: 1000 mg via TOPICAL

## 2018-05-03 MED ORDER — LIDOCAINE 2% (20 MG/ML) 5 ML SYRINGE
INTRAMUSCULAR | Status: DC | PRN
  Administered 2018-05-03: 60 mg via INTRAVENOUS

## 2018-05-03 MED ORDER — 0.9 % SODIUM CHLORIDE (POUR BTL) OPTIME
TOPICAL | Status: DC | PRN
  Administered 2018-05-03: 1000 mL

## 2018-05-03 MED ORDER — DEXAMETHASONE SODIUM PHOSPHATE 10 MG/ML IJ SOLN
INTRAMUSCULAR | Status: AC
  Filled 2018-05-03: qty 1

## 2018-05-03 MED ORDER — CEFAZOLIN SODIUM-DEXTROSE 2-4 GM/100ML-% IV SOLN
INTRAVENOUS | Status: AC
  Filled 2018-05-03: qty 100

## 2018-05-03 MED ORDER — ROCURONIUM BROMIDE 50 MG/5ML IV SOSY
PREFILLED_SYRINGE | INTRAVENOUS | Status: AC
  Filled 2018-05-03: qty 5

## 2018-05-03 MED ORDER — CEFAZOLIN SODIUM-DEXTROSE 2-4 GM/100ML-% IV SOLN
2.0000 g | Freq: Three times a day (TID) | INTRAVENOUS | Status: AC
  Administered 2018-05-03 – 2018-05-04 (×3): 2 g via INTRAVENOUS
  Filled 2018-05-03 (×3): qty 100

## 2018-05-03 MED ORDER — FENTANYL CITRATE (PF) 100 MCG/2ML IJ SOLN: 25.0000 ug | INTRAMUSCULAR | Status: DC | PRN

## 2018-05-03 MED ORDER — VANCOMYCIN HCL 1000 MG IV SOLR
INTRAVENOUS | Status: AC
  Filled 2018-05-03: qty 1000

## 2018-05-03 MED ORDER — LACTATED RINGERS IV SOLN
INTRAVENOUS | Status: DC
  Administered 2018-05-03 (×2): via INTRAVENOUS

## 2018-05-03 MED ORDER — DEXAMETHASONE SODIUM PHOSPHATE 10 MG/ML IJ SOLN
INTRAMUSCULAR | Status: DC | PRN
  Administered 2018-05-03: 10 mg via INTRAVENOUS

## 2018-05-03 MED ORDER — FENTANYL CITRATE (PF) 100 MCG/2ML IJ SOLN
INTRAMUSCULAR | Status: DC | PRN
  Administered 2018-05-03 (×4): 50 ug via INTRAVENOUS
  Administered 2018-05-03 (×2): 25 ug via INTRAVENOUS

## 2018-05-03 MED ORDER — ONDANSETRON HCL 4 MG/2ML IJ SOLN
INTRAMUSCULAR | Status: AC
  Filled 2018-05-03: qty 2

## 2018-05-03 MED ORDER — LIDOCAINE 2% (20 MG/ML) 5 ML SYRINGE
INTRAMUSCULAR | Status: AC
  Filled 2018-05-03: qty 5

## 2018-05-03 MED ORDER — TOBRAMYCIN SULFATE 1.2 G IJ SOLR
INTRAMUSCULAR | Status: AC
  Filled 2018-05-03: qty 1.2

## 2018-05-03 MED ORDER — SUGAMMADEX SODIUM 200 MG/2ML IV SOLN
INTRAVENOUS | Status: DC | PRN
  Administered 2018-05-03: 200 mg via INTRAVENOUS

## 2018-05-03 MED ORDER — ROCURONIUM BROMIDE 50 MG/5ML IV SOSY
PREFILLED_SYRINGE | INTRAVENOUS | Status: DC | PRN
  Administered 2018-05-03: 15 mg via INTRAVENOUS
  Administered 2018-05-03: 35 mg via INTRAVENOUS

## 2018-05-03 MED ORDER — PROMETHAZINE HCL 25 MG/ML IJ SOLN: 6.2500 mg | INTRAMUSCULAR | Status: DC | PRN

## 2018-05-03 MED ORDER — FENTANYL CITRATE (PF) 250 MCG/5ML IJ SOLN
INTRAMUSCULAR | Status: AC
  Filled 2018-05-03: qty 5

## 2018-05-03 MED ORDER — PHENYLEPHRINE 40 MCG/ML (10ML) SYRINGE FOR IV PUSH (FOR BLOOD PRESSURE SUPPORT)
PREFILLED_SYRINGE | INTRAVENOUS | Status: DC | PRN
  Administered 2018-05-03 (×2): 40 ug via INTRAVENOUS
  Administered 2018-05-03 (×2): 80 ug via INTRAVENOUS
  Administered 2018-05-03: 40 ug via INTRAVENOUS

## 2018-05-03 MED ORDER — PHENYLEPHRINE 40 MCG/ML (10ML) SYRINGE FOR IV PUSH (FOR BLOOD PRESSURE SUPPORT)
PREFILLED_SYRINGE | INTRAVENOUS | Status: AC
  Filled 2018-05-03: qty 10

## 2018-05-03 MED ORDER — PROPOFOL 10 MG/ML IV BOLUS
INTRAVENOUS | Status: DC | PRN
  Administered 2018-05-03: 100 mg via INTRAVENOUS

## 2018-05-03 MED ORDER — SODIUM CHLORIDE 0.9 % IR SOLN
Status: DC | PRN
  Administered 2018-05-03: 3000 mL

## 2018-05-03 MED ORDER — PROPOFOL 10 MG/ML IV BOLUS
INTRAVENOUS | Status: AC
  Filled 2018-05-03: qty 40

## 2018-05-03 SURGICAL SUPPLY — 60 items
BLADE SAW SGTL 73X25 THK (BLADE) ×2 IMPLANT
BNDG COHESIVE 6X5 TAN STRL LF (GAUZE/BANDAGES/DRESSINGS) ×2 IMPLANT
BRUSH FEMORAL CANAL (MISCELLANEOUS) ×3 IMPLANT
BRUSH SCRUB SURG 4.25 DISP (MISCELLANEOUS) ×4 IMPLANT
CEMENT BONE SIMPLEX SPEEDSET (Cement) ×2 IMPLANT
CEMENT RESTRICTOR BONE PREP ST (KITS) ×1 IMPLANT
CHLORAPREP W/TINT 26ML (MISCELLANEOUS) ×2 IMPLANT
COVER SURGICAL LIGHT HANDLE (MISCELLANEOUS) ×2 IMPLANT
COVER WAND RF STERILE (DRAPES) ×2 IMPLANT
DERMABOND ADVANCED (GAUZE/BANDAGES/DRESSINGS) ×1
DERMABOND ADVANCED .7 DNX12 (GAUZE/BANDAGES/DRESSINGS) ×1 IMPLANT
DRAPE HIP W/POCKET STRL (DRAPE) ×2 IMPLANT
DRAPE INCISE IOBAN 85X60 (DRAPES) ×4 IMPLANT
DRAPE ORTHO SPLIT 77X108 STRL (DRAPES) ×2
DRAPE SURG 17X23 STRL (DRAPES) ×2 IMPLANT
DRAPE SURG ORHT 6 SPLT 77X108 (DRAPES) ×2 IMPLANT
DRAPE U-SHAPE 47X51 STRL (DRAPES) ×2 IMPLANT
DRSG MEPILEX BORDER 4X12 (GAUZE/BANDAGES/DRESSINGS) ×1 IMPLANT
DRSG MEPILEX BORDER 4X8 (GAUZE/BANDAGES/DRESSINGS) ×2 IMPLANT
ELECT BLADE 6.5 EXT (BLADE) ×1 IMPLANT
ELECT CAUTERY BLADE 6.4 (BLADE) IMPLANT
ELECT REM PT RETURN 9FT ADLT (ELECTROSURGICAL) ×2
ELECTRODE REM PT RTRN 9FT ADLT (ELECTROSURGICAL) ×1 IMPLANT
GLOVE BIO SURGEON STRL SZ7.5 (GLOVE) ×6 IMPLANT
GLOVE BIOGEL PI IND STRL 7.5 (GLOVE) ×1 IMPLANT
GLOVE BIOGEL PI INDICATOR 7.5 (GLOVE) ×1
GOWN STRL REUS W/ TWL LRG LVL3 (GOWN DISPOSABLE) ×2 IMPLANT
GOWN STRL REUS W/TWL LRG LVL3 (GOWN DISPOSABLE) ×2
HANDPIECE INTERPULSE COAX TIP (DISPOSABLE) ×1
HEAD MODULAR ENDO (Orthopedic Implant) ×1 IMPLANT
HEAD UNPLR 48XMDLR STRL HIP (Orthopedic Implant) IMPLANT
KIT BASIN OR (CUSTOM PROCEDURE TRAY) ×2 IMPLANT
KIT TURNOVER KIT B (KITS) ×2 IMPLANT
MANIFOLD NEPTUNE II (INSTRUMENTS) ×2 IMPLANT
NDL 1/2 CIR MAYO (NEEDLE) ×1 IMPLANT
NEEDLE 1/2 CIR MAYO (NEEDLE) ×2 IMPLANT
NS IRRIG 1000ML POUR BTL (IV SOLUTION) ×2 IMPLANT
PACK TOTAL JOINT (CUSTOM PROCEDURE TRAY) ×2 IMPLANT
PAD ARMBOARD 7.5X6 YLW CONV (MISCELLANEOUS) ×4 IMPLANT
PRESSURIZER FEMORAL UNIV (MISCELLANEOUS) ×2 IMPLANT
RETRIEVER SUT HEWSON (MISCELLANEOUS) ×1 IMPLANT
SET HNDPC FAN SPRY TIP SCT (DISPOSABLE) ×1 IMPLANT
SLEEVE UNITRAX V40 (Orthopedic Implant) ×1 IMPLANT
SLEEVE UNITRAX V40 +4 (Orthopedic Implant) IMPLANT
SPONGE LAP 18X18 RF (DISPOSABLE) ×3 IMPLANT
STAPLER VISISTAT 35W (STAPLE) ×2 IMPLANT
STEM HIP ACCOLADE SZ5 37X145 (Stem) ×1 IMPLANT
STOCKINETTE IMPERVIOUS LG (DRAPES) ×2 IMPLANT
SUT ETHILON 3 0 PS 1 (SUTURE) ×2 IMPLANT
SUT MNCRL AB 3-0 PS2 18 (SUTURE) ×2 IMPLANT
SUT VIC AB 0 CT1 27 (SUTURE) ×2
SUT VIC AB 0 CT1 27XBRD ANBCTR (SUTURE) ×2 IMPLANT
SUT VIC AB 1 CT1 27 (SUTURE) ×2
SUT VIC AB 1 CT1 27XBRD ANBCTR (SUTURE) IMPLANT
SUT VIC AB 1 CTX 18 (SUTURE) ×2 IMPLANT
SUT VIC AB 2-0 CT1 27 (SUTURE) ×4
SUT VIC AB 2-0 CT1 TAPERPNT 27 (SUTURE) ×2 IMPLANT
TOWEL OR 17X26 10 PK STRL BLUE (TOWEL DISPOSABLE) ×2 IMPLANT
TOWER CARTRIDGE SMART MIX (DISPOSABLE) ×2 IMPLANT
WATER STERILE IRR 1000ML POUR (IV SOLUTION) ×2 IMPLANT

## 2018-05-03 NOTE — Interval H&P Note (Signed)
History and Physical Interval Note:  05/03/2018 8:42 AM  Tracey Porter  has presented today for surgery, with the diagnosis of right femoral neck fracture  The various methods of treatment have been discussed with the patient and family. After consideration of risks, benefits and other options for treatment, the patient has consented to  Procedure(s): ARTHROPLASTY BIPOLAR HIP (HEMIARTHROPLASTY) (Right) as a surgical intervention .  The patient's history has been reviewed, patient examined, no change in status, stable for surgery.  I have reviewed the patient's chart and labs.  Questions were answered to the patient's satisfaction.     Caryn Bee P Olie Scaffidi

## 2018-05-03 NOTE — Anesthesia Procedure Notes (Signed)
Procedure Name: Intubation Date/Time: 05/03/2018 8:54 AM Performed by: Pearson Grippe, CRNA Pre-anesthesia Checklist: Patient identified, Emergency Drugs available, Suction available and Patient being monitored Patient Re-evaluated:Patient Re-evaluated prior to induction Oxygen Delivery Method: Circle system utilized Preoxygenation: Pre-oxygenation with 100% oxygen Induction Type: IV induction Ventilation: Mask ventilation without difficulty Laryngoscope Size: Miller and 2 Grade View: Grade III Tube type: Oral Tube size: 7.0 mm Number of attempts: 1 Airway Equipment and Method: Stylet and Oral airway Placement Confirmation: ETT inserted through vocal cords under direct vision,  positive ETCO2 and breath sounds checked- equal and bilateral Secured at: 21 cm Tube secured with: Tape Dental Injury: Teeth and Oropharynx as per pre-operative assessment  Difficulty Due To: Difficult Airway- due to anterior larynx

## 2018-05-03 NOTE — Evaluation (Signed)
Physical Therapy Evaluation Patient Details Name: Tracey Porter MRN: [id number] DOB: 05/11/1930 Today's Date: 05/03/2018   History of Present Illness  Pt is 84 y/o female s/p posterior approach hemiarthroplasty of R hip (05/03/2018) secondary to femoral neck fracture sustained (04/30/18) from a fall in ALF. Pt has history of complex pelvic ring injury 1 year prior, hypertension, hyperlipidemia, paroxysmal A. fib on Eliquis, chronic diastolic CHF, paroxysmal SVT.     Clinical Impression  Pt is s/p THA resulting in the deficits listed below (see PT Problem List). Pt requires modA for bed mobility and maxAx2 for stand pivot to recliner. Pt unable to recall precautions despite increased education and cuing. Pt left with pillow between legs to maintain precautions.  Pt will benefit from skilled PT to increase their independence and safety with mobility to allow discharge to the venue listed below.      Follow Up Recommendations SNF    Equipment Recommendations  None recommended by PT    Recommendations for Other Services OT consult     Precautions / Restrictions Precautions Precautions: Posterior Hip Precaution Booklet Issued: Yes (comment) Precaution Comments: despite education x3 pt with 0/3 precaution recall at end of session  Restrictions Weight Bearing Restrictions: Yes RLE Weight Bearing: Weight bearing as tolerated      Mobility  Bed Mobility Overal bed mobility: Needs Assistance Bed Mobility: Supine to Sit     Supine to sit: Mod assist;+2 for physical assistance;HOB elevated     General bed mobility comments: modA for moving LE across bed while maintaining posterior hip precautions, vc for need to keep hip abducted, ER, and not to flex past 90 degrees  Transfers Overall transfer level: Needs assistance Equipment used: Rolling walker (2 wheeled) Transfers: Sit to/from [employer] Sit to Stand: Mod assist;From elevated surface;+2 physical  assistance Stand pivot transfers: Max assist;+2 physical assistance       General transfer comment: modA to power up from elevated surface to RW, despite maximal verbal and tactile cuing pt unable to sequnce stepping with RW to achieve transfer to recliner, pt sat back down and was transferred with stand pivot transfer to recliner  Ambulation/Gait             General Gait Details: unable to attempt         Balance Overall balance assessment: Needs assistance Sitting-balance support: Feet supported;No upper extremity supported Sitting balance-Leahy Scale: Fair     Standing balance support: Bilateral upper extremity supported Standing balance-Leahy Scale: Poor                               Pertinent Vitals/Pain Pain Assessment: 0-10 Pain Score: 3  Pain Location: R hip Pain Descriptors / Indicators: Guarding;Grimacing;Operative site guarding Pain Intervention(s): Limited activity within patient's tolerance;Monitored during session;Repositioned    Home Living Family/patient expects to be discharged to:: Skilled nursing facility                      Prior Function Level of Independence: Needs assistance   Gait / Transfers Assistance Needed: ambulates with RW around apartment and down to dining room for diner  ADL's / Homemaking Assistance Needed: prepares breakfast and lunch, has assists for showers MWF  Comments: Pt resident of ALF      Hand Dominance        Extremity/Trunk Assessment   Upper Extremity Assessment Upper Extremity Assessment: Overall WFL for tasks assessed  Lower Extremity Assessment Lower Extremity Assessment: RLE deficits/detail;LLE deficits/detail RLE Deficits / Details: R LE ROM and strength difficult to assess due to surgical pain and posterior hip precautions RLE: Unable to fully assess due to pain;Unable to fully assess due to immobilization       Communication   Communication: No difficulties  Cognition  Arousal/Alertness: Awake/alert Behavior During Therapy: WFL for tasks assessed/performed Overall Cognitive Status: No family/caregiver present to determine baseline cognitive functioning                                 General Comments: no recall of precautions, repeated herself several times during session,       General Comments General comments (skin integrity, edema, etc.): SaO2 on RA >90%O2 throughout session        Assessment/Plan    PT Assessment Patient needs continued PT services  PT Problem List Decreased strength;Decreased range of motion;Decreased activity tolerance;Decreased balance;Decreased mobility;Decreased cognition;Decreased knowledge of use of DME;Decreased safety awareness;Decreased knowledge of precautions;Pain       PT Treatment Interventions DME instruction;Gait training;Functional mobility training;Therapeutic activities;Therapeutic exercise;Balance training;Cognitive remediation;Patient/family education    PT Goals (Current goals can be found in the Care Plan section)  Acute Rehab PT Goals Patient Stated Goal: get back to water exercise PT Goal Formulation: With patient Time For Goal Achievement: 05/17/18    Frequency Min 5X/week    AM-PAC PT "6 Clicks" Daily Activity  Outcome Measure Difficulty turning over in bed (including adjusting bedclothes, sheets and blankets)?: Unable Difficulty moving from lying on back to sitting on the side of the bed? : Unable Difficulty sitting down on and standing up from a chair with arms (e.g., wheelchair, bedside commode, etc,.)?: Unable Help needed moving to and from a bed to chair (including a wheelchair)?: A Lot Help needed walking in hospital room?: Total Help needed climbing 3-5 steps with a railing? : Total 6 Click Score: 7    End of Session Equipment Utilized During Treatment: Gait belt Activity Tolerance: Patient limited by pain;Patient limited by fatigue Patient left: in chair;with call  bell/phone within reach;with chair alarm set Nurse Communication: Mobility status;Precautions PT Visit Diagnosis: Unsteadiness on feet (R26.81);Other abnormalities of gait and mobility (R26.89);History of falling (Z91.81);Muscle weakness (generalized) (M62.81);Difficulty in walking, not elsewhere classified (R26.2);Pain Pain - Right/Left: Right Pain - part of body: Hip    Time: 1610-9604 PT Time Calculation (min) (ACUTE ONLY): 22 min   Charges:   PT Evaluation $PT Eval Moderate Complexity: 1 Mod          Giorgia Wahler B. Beverely Risen PT, DPT Acute Rehabilitation Services Pager [phone] Office [phone]   Elon Alas Fleet 05/03/2018, 4:54 PM

## 2018-05-03 NOTE — Op Note (Signed)
OrthopaedicSurgeryOperativeNote (ZOX:[id number]) Date of Surgery: 05/03/2018  Admit Date: 04/30/2018   Diagnoses: Pre-Op Diagnoses: Right femoral neck fracture  Post-Op Diagnosis: Same  Procedures: CPT 27125-Right hip hemiarthroplasty  Surgeons: Primary: Roby Lofts, MD   Location:[facility] OR ROOM 07   AnesthesiaGeneral   Antibiotics:Ancef 2g preop   Tourniquettime:* No tourniquets in log * .  EstimatedBloodLoss:150 mL   Complications:None  Specimens: ID Type Source Tests Collected by Time Destination  1 : femoral neck and head, right Tissue Bone SURGICAL PATHOLOGY Akshita Italiano, Gillie Manners, MD 05/03/2018 0940     Implants: Implant Name Type Inv. Item Serial No. Manufacturer Lot No. LRB No. Used Action  CEMENT BONE SIMPLEX SPEEDSET - [id number] Cement CEMENT BONE SIMPLEX SPEEDSET  [employer] [id number] Right 2 Implanted  STEM HIP ACCOLADE SZ5 [id number] - [id number] Stem STEM HIP ACCOLADE SZ5 [id number]  [employer] [id number] Right 1 Implanted  SLEEVE UNITRAX V40 - [id number] Orthopedic Implant SLEEVE UNITRAX V40  [employer] [id number] Right 1 Implanted  HEAD MODULAR ENDO - [id number] Orthopedic Implant HEAD MODULAR ENDO  [employer] [id number] Right 1 Implanted    IndicationsforSurgery: 84 year old female with a history of A. fib and RVR with previous pelvic ring injury that is now healed now presents with a displaced right femoral neck fracture.Patient will need to undergo a right hip hemiarthroplasty.  I discussed risks and benefits with the patient and her daughter.  We delayed surgery to allow clearance of Eliquis.  Risks discussed included bleeding requiring blood transfusion, bleeding causing a hematoma, infection, malunion, nonunion, damage to surrounding nerves and blood vessels, pain, hardware prominence or irritation, hardware failure, stiffness, post-traumatic arthritis, DVT/PE, compartment syndrome, and even death.  Operative Findings: Right hip  hemiarthroplasty, cemented with above implants.  Procedure: The patient was identified in the preoperative holding area. Consent was confirmed with the patient and their family and all questions were answered. The operative extremity was marked after confirmation with the patient. They were then brought back to the operating room by our anesthesia colleagues. General anesthesia was induced and they were transferred carefully over to a regular OR table. The patient was placed in the lateral decubitus position with the operative side up. All bony prominences were well padded. The bean bag was inflated. The operative extremity was prepped and draped in usual sterile fashion. A timeout was performed to verify the patient, the procedure and the extremity. Preoperative antibiotics were dosed.  A posterolateral approach was made. The skin incised and the subcutaneous tissue was incised along the incision. The IT band was split inline with the incision, as well. A Charnley retractor was then placed under the IT band. The sciatic nerve was identified and protected through the whole portion of the procedure. The piriformis and short external rotators were reflected off the greater trochanter and tagged for later repair. The inferior and superior portion of the capsule was reflected off as flaps. These were tagged for later repair. The capsule was incised all the way to the acetabular labrum to exposed the femoral head and femoral neck fracture.  An oscillating saw was used to make a femoral neck cut distal to the fracture. The femoral head was removed with a corkscrew and it was measured for sizing of the femoral prothesis. The acetabulum was cleared of the ligamentum teres. The femoral head sizer was used and a 48 mm head was chosen.  I then proceeded to focus on broaching the femur. A box osteotomy was used to remove the lateral portion of the  femoral neck. A canal finder was used to enter the canal. A lateralizer  was used to ream a portion of the lateral femoral neck to prevent varus alignment of the prothesis. Sequential broaching was performed to size the canal and prothesis. I chose a #5 stem as it had good fit with the broach. A calcar planer was used to level the femoral neck cut. The broach was removed and the canal was prepped for cementing.  A canal restrictor was placed in the femoral shaft. The canal was irrigated and cleaned. A lap was placed in the acetabulum to prevent cement from entering the joint. Cement was mixed and once it was ready it was slowly put into the canal. The cement was pressurized and the prosthesis was placed in her natural version. It was held in position while the cement hardened. Once it was hard the femoral stem was trialed off of and I chose +4 stem. The trial showed that her leg lengths were stable and her hip was stable with inability to dislocated in normal hip positions. She had nearly 90 degrees of internal rotation with the hip flexed to 90 degrees. The final prosthesis and head was was positioned and the hip was relocated.  The incision was irrigated with normal saline. 1 gram of vancomycin powder was placed in the wound. The capsule was closed with #1 vicryl in interrupted fashion. The short external rotators were repaired with #1 vicryl. The IT band was closed with 0 vicryl suture. The skin was closed with 2-0 vicryl, 3-0 nylon. A mepilex dressing was placed. The patient was placed supine, extubated and taken to the PACU in stable condition.   Post Op Plan/Instructions: Patient was weightbearing as tolerated to right lower extremity. Posterior hip precautions. Ancef for surgical prophylaxis. Restart Eliquis postoperative day 1. Physical and occupational therapy eval.  I was present and performed the entire surgery.  Truitt Merle, MD [facility]

## 2018-05-03 NOTE — Transfer of Care (Signed)
Immediate Anesthesia Transfer of Care Note  Patient: Tracey Porter  Procedure(s) Performed: ARTHROPLASTY BIPOLAR HIP (HEMIARTHROPLASTY) (Right Hip)  Patient Location: PACU  Anesthesia Type:General  Level of Consciousness: awake, drowsy and patient cooperative  Airway & Oxygen Therapy: Patient Spontanous Breathing and Patient connected to face mask oxygen  Post-op Assessment: Report given to RN and Post -op Vital signs reviewed and stable  Post vital signs: Reviewed and stable  Last Vitals:  Vitals Value Taken Time  BP 126/54 05/03/2018 11:41 AM  Temp    Pulse 74 05/03/2018 11:42 AM  Resp 16 05/03/2018 11:42 AM  SpO2 93 % 05/03/2018 11:42 AM  Vitals shown include unvalidated device data.  Last Pain:  Vitals:   05/03/18 0429  TempSrc: Oral  PainSc:       Patients Stated Pain Goal: 3 (05/02/18 2239)  Complications: No apparent anesthesia complications

## 2018-05-03 NOTE — Progress Notes (Signed)
PROGRESS NOTE    Tracey Porter  ZOX:[id number] DOB: 1930/06/03 DOA: 04/30/2018 PCP: Florentina Jenny, MD   Brief Narrative:  84 year old with history of essential hypertension, hyperlipidemia, paroxysmal atrial fibrillation on Eliquis, diastolic CHF, paroxysmal SVT came to the hospital with right hip pain.  She was found to have acute right subcapital femoral neck fracture with old inferior pubic rami fracture.  Orthopedic was consulted.  Her Eliquis was held. Right hip arthroplasty on 10/23   Assessment & Plan:   Principal Problem:   Hip fracture (HCC) Active Problems:   Non-insulin treated type 2 diabetes mellitus (HCC)   Paroxysmal SVT (supraventricular tachycardia) (HCC)   Essential hypertension   Chronic anticoagulation   Chronic diastolic heart failure (HCC)   PAF (paroxysmal atrial fibrillation) (HCC)   Dyslipidemia  Displaced right hip fracture - Plans for right hip arthroplasty today, currently Eliquis on hold.  Will resume once cleared by surgery. - PT/OT starting tomorrow -Pain control, incentive spirometry - Chemoprophylaxis per surgery  Paroxysmal atrial fibrillation -Continue patient's amiodarone and beta-blocker.  Eliquis can be resumed once cleared by orthopedic  Diet-controlled diabetes mellitus type 2 -Most recent hemoglobin A1c 6.2.  Continue Accu-Cheks and sliding scale  History of GERD -Continue Protonix 40 mg daily  Essential hypertension - Continue Toprol-XL.   DVT prophylaxis: Plans to resume Eliquis Code Status: Full code Family Communication: None at bedside Disposition Plan: She will require rehab facility placement after her surgery.  Evaluation can take place tomorrow for safe disposition.  Consultants:   Orthopedic  Procedures:   Right hip arthroplasty/23  Antimicrobials:   None   Subjective: No complaints, eager for surgery  Review of Systems Otherwise negative except as per HPI, including: General = no fevers, chills,  dizziness, malaise, fatigue HEENT/EYES = negative for pain, redness, loss of vision, double vision, blurred vision, loss of hearing, sore throat, hoarseness, dysphagia Cardiovascular= negative for chest pain, palpitation, murmurs, lower extremity swelling Respiratory/lungs= negative for shortness of breath, cough, hemoptysis, wheezing, mucus production Gastrointestinal= negative for nausea, vomiting,, abdominal pain, melena, hematemesis Genitourinary= negative for Dysuria, Hematuria, Change in Urinary Frequency MSK = Negative for arthralgia, myalgias, Back Pain, Joint swelling  Neurology= Negative for headache, seizures, numbness, tingling  Psychiatry= Negative for anxiety, depression, suicidal and homocidal ideation Allergy/Immunology= Medication/Food allergy as listed  Skin= Negative for Rash, lesions, ulcers, itching   Objective: Vitals:   05/03/18 1141 05/03/18 1143 05/03/18 1156 05/03/18 1211  BP: (!) 126/54  133/60 (!) 129/59  Pulse: 76  72 72  Resp: 15  15 16   Temp:  98.1 F (36.7 C)    TempSrc:      SpO2: 94%  99% 99%  Weight:      Height:        Intake/Output Summary (Last 24 hours) at 05/03/2018 1214 Last data filed at 05/03/2018 1114 Gross per 24 hour  Intake 1840 ml  Output 1200 ml  Net 640 ml   Filed Weights   04/30/18 1931 04/30/18 2300 05/03/18 0749  Weight: 68 kg 61.2 kg 61 kg    Examination: Constitutional: NAD, calm, comfortable Eyes: PERRL, lids and conjunctivae normal ENMT: Mucous membranes are moist. Posterior pharynx clear of any exudate or lesions.Normal dentition.  Neck: normal, supple, no masses, no thyromegaly Respiratory: clear to auscultation bilaterally, no wheezing, no crackles. Normal respiratory effort. No accessory muscle use.  Cardiovascular: Regular rate and rhythm, no murmurs / rubs / gallops. No extremity edema. 2+ pedal pulses. No carotid bruits.  Abdomen: no tenderness, no  masses palpated. No hepatosplenomegaly. Bowel sounds  positive.  Musculoskeletal: Limited range of motion of her right lower extremity due to pain Skin: no rashes, lesions, ulcers. No induration Neurologic: CN 2-12 grossly intact. Sensation intact, DTR normal. Strength 5/5 in all 4.  Psychiatric: Normal judgment and insight. Alert and oriented x 3. Normal mood.     Data Reviewed:   CBC: Recent Labs  Lab 04/30/18 1437 05/02/18 2343  WBC 9.6 12.2*  NEUTROABS 8.1*  --   HGB 12.8 11.1*  HCT 40.4 33.8*  MCV 100.5* 97.4  PLT 272 243   Basic Metabolic Panel: Recent Labs  Lab 04/30/18 1437 05/01/18 0502 05/03/18 0000  NA 137 135 133*  K 4.2 3.8 4.0  CL 101 100 99  CO2 26 28 27   GLUCOSE 103* 124* 120*  BUN 21 13 18   CREATININE 0.65 0.60 0.71  CALCIUM 9.3 8.9 8.6*  MG  --   --  1.8   GFR: Estimated Creatinine Clearance: 41.2 mL/min (by C-G formula based on SCr of 0.71 mg/dL). Liver Function Tests: Recent Labs  Lab 05/01/18 0502  AST 21  ALT 21  ALKPHOS 65  BILITOT 0.6  PROT 6.4*  ALBUMIN 3.2*   No results for input(s): LIPASE, AMYLASE in the last 168 hours. No results for input(s): AMMONIA in the last 168 hours. Coagulation Profile: Recent Labs  Lab 04/30/18 1605  INR 1.20   Cardiac Enzymes: No results for input(s): CKTOTAL, CKMB, CKMBINDEX, TROPONINI in the last 168 hours. BNP (last 3 results) No results for input(s): PROBNP in the last 8760 hours. HbA1C: No results for input(s): HGBA1C in the last 72 hours. CBG: Recent Labs  Lab 05/02/18 1133 05/02/18 1617 05/02/18 2214 05/03/18 0624 05/03/18 1144  GLUCAP 142* 139* 115* 99 113*   Lipid Profile: No results for input(s): CHOL, HDL, LDLCALC, TRIG, CHOLHDL, LDLDIRECT in the last 72 hours. Thyroid Function Tests: No results for input(s): TSH, T4TOTAL, FREET4, T3FREE, THYROIDAB in the last 72 hours. Anemia Panel: No results for input(s): VITAMINB12, FOLATE, FERRITIN, TIBC, IRON, RETICCTPCT in the last 72 hours. Sepsis Labs: No results for input(s):  PROCALCITON, LATICACIDVEN in the last 168 hours.  Recent Results (from the past 240 hour(s))  Surgical pcr screen     Status: None   Collection Time: 04/30/18 10:04 PM  Result Value Ref Range Status   MRSA, PCR NEGATIVE NEGATIVE Final   Staphylococcus aureus NEGATIVE NEGATIVE Final    Comment: (NOTE) The Xpert SA Assay (FDA approved for NASAL specimens in patients 3 years of age and older), is one component of a comprehensive surveillance program. It is not intended to diagnose infection nor to guide or monitor treatment. Performed at [facility], [address], [place], [place] [address]          Radiology Studies: No results found.      Scheduled Meds: . [MAR Hold] acidophilus  1 capsule Oral Daily  . [MAR Hold] acyclovir  800 mg Oral BID  . [MAR Hold] amiodarone  200 mg Oral BID  . [MAR Hold] insulin aspart  0-9 Units Subcutaneous TID WC  . [MAR Hold] metoprolol succinate  12.5 mg Oral QHS  . [MAR Hold] mirabegron ER  25 mg Oral Daily  . [MAR Hold] pantoprazole  40 mg Oral Daily  . [MAR Hold] polyvinyl alcohol  1 drop Both Eyes BID  . [MAR Hold] prednisoLONE acetate  1 drop Right Eye QID  . [MAR Hold] timolol  1 drop Both  Eyes BID  . [MAR Hold] vitamin B-12  1,000 mcg Oral Daily   Continuous Infusions: . [MAR Hold] sodium chloride 250 mL (04/30/18 2313)  . ceFAZolin    . lactated ringers 10 mL/hr at 05/03/18 0752     LOS: 3 days   Time spent= 20 mins    Jericha Bryden Joline Maxcy, MD [facility] Pager [phone]   If 7PM-7AM, please contact night-coverage [email or web address] Password [email or web address] 05/03/2018, 12:14 PM

## 2018-05-03 NOTE — Progress Notes (Signed)
   05/03/18 0900  OT Visit Information  Last OT Received On 05/03/18  Reason Eval/Treat Not Completed Patient at procedure or test/ unavailable. Pt is in OR. Plan to reattempt tomorrow.      Raynald Kemp, OT Acute Rehabilitation Services Pager: [phone] Office: [phone]

## 2018-05-03 NOTE — Anesthesia Postprocedure Evaluation (Signed)
Anesthesia Post Note  Patient: Tracey Porter  Procedure(s) Performed: ARTHROPLASTY BIPOLAR HIP (HEMIARTHROPLASTY) (Right Hip)     Patient location during evaluation: PACU Anesthesia Type: General Level of consciousness: awake and alert Pain management: pain level controlled Vital Signs Assessment: post-procedure vital signs reviewed and stable Respiratory status: spontaneous breathing, nonlabored ventilation, respiratory function stable and patient connected to nasal cannula oxygen Cardiovascular status: blood pressure returned to baseline and stable Postop Assessment: no apparent nausea or vomiting Anesthetic complications: no    Last Vitals:  Vitals:   05/03/18 1211 05/03/18 1223  BP: (!) 129/59   Pulse: 72   Resp: 16   Temp:  (!) 36.3 C  SpO2: 99%     Last Pain:  Vitals:   05/03/18 1223  TempSrc:   PainSc: 0-No pain                 Kennieth Rad

## 2018-05-04 ENCOUNTER — Encounter (HOSPITAL_COMMUNITY): Payer: Self-pay | Admitting: Student

## 2018-05-04 DIAGNOSIS — I471 Supraventricular tachycardia: Secondary | ICD-10-CM

## 2018-05-04 LAB — CBC
HEMATOCRIT: 33.1 % — AB (ref 36.0–46.0)
HEMOGLOBIN: 10.5 g/dL — AB (ref 12.0–15.0)
MCH: 31.3 pg (ref 26.0–34.0)
MCHC: 31.7 g/dL (ref 30.0–36.0)
MCV: 98.5 fL (ref 80.0–100.0)
Platelets: 288 10*3/uL (ref 150–400)
RBC: 3.36 MIL/uL — AB (ref 3.87–5.11)
RDW: 13.2 % (ref 11.5–15.5)
WBC: 14.4 10*3/uL — ABNORMAL HIGH (ref 4.0–10.5)
nRBC: 0 % (ref 0.0–0.2)

## 2018-05-04 LAB — BASIC METABOLIC PANEL
Anion gap: 10 (ref 5–15)
BUN: 14 mg/dL (ref 8–23)
CHLORIDE: 97 mmol/L — AB (ref 98–111)
CO2: 27 mmol/L (ref 22–32)
CREATININE: 0.64 mg/dL (ref 0.44–1.00)
Calcium: 8.5 mg/dL — ABNORMAL LOW (ref 8.9–10.3)
GFR calc Af Amer: >60 mL/min (ref >60)
GFR calc non Af Amer: >60 mL/min (ref >60)
GLUCOSE: 136 mg/dL — AB (ref 70–99)
POTASSIUM: 3.5 mmol/L (ref 3.5–5.1)
SODIUM: 134 mmol/L — AB (ref 135–145)

## 2018-05-04 LAB — GLUCOSE, CAPILLARY
GLUCOSE-CAPILLARY: 106 mg/dL — AB (ref 70–99)
Glucose-Capillary: 159 mg/dL — ABNORMAL HIGH (ref 70–99)
Glucose-Capillary: 120 mg/dL — ABNORMAL HIGH (ref 70–99)
Glucose-Capillary: 106 mg/dL — ABNORMAL HIGH (ref 70–99)

## 2018-05-04 LAB — MAGNESIUM: Magnesium: 1.8 mg/dL (ref 1.7–2.4)

## 2018-05-04 MED ORDER — SENNOSIDES-DOCUSATE SODIUM 8.6-50 MG PO TABS: 2.0000 | ORAL_TABLET | Freq: Every day | ORAL | 0 refills | Status: AC | PRN

## 2018-05-04 MED ORDER — APIXABAN 5 MG PO TABS
5.0000 mg | ORAL_TABLET | Freq: Two times a day (BID) | ORAL | Status: DC
  Administered 2018-05-04 – 2018-05-05 (×2): 5 mg via ORAL
  Filled 2018-05-04 (×2): qty 1

## 2018-05-04 MED ORDER — POLYETHYLENE GLYCOL 3350 17 G PO PACK: 17.0000 g | PACK | Freq: Every day | ORAL | 0 refills | Status: AC

## 2018-05-04 MED ORDER — HYDROCODONE-ACETAMINOPHEN 5-325 MG PO TABS: 1.0000 | ORAL_TABLET | Freq: Four times a day (QID) | ORAL | 0 refills | Status: AC | PRN

## 2018-05-04 NOTE — Progress Notes (Signed)
Physical Therapy Treatment Patient Details Name: Tracey Porter MRN: [id number] DOB: [birth year] Today's Date: 05/04/2018    History of Present Illness Pt is 84 y/o female s/p posterior approach hemiarthroplasty of R hip (05/03/2018) secondary to femoral neck fracture sustained (04/30/18) from a fall in ALF. Pt has history of complex pelvic ring injury 1 year prior, hypertension, hyperlipidemia, paroxysmal A. fib on Eliquis, chronic diastolic CHF, paroxysmal SVT.       PT Comments    Pt is making good progress towards her goals today, however continues to be limited in her safe mobility by decreased safety awareness (unable to recall precautions), R hip pain with movement, and decreased strength and endurance. Pt currently requires mod A for bed mobility, modAx 2 for transfers and minAx2 for ambulation of 12 feet with RW. Discussed possibility of CIR with daughter and after watching treatment session agreed that SNF level placement would be appropriate.    Follow Up Recommendations  SNF     Equipment Recommendations  None recommended by PT    Recommendations for Other Services OT consult     Precautions / Restrictions Precautions Precautions: Posterior Hip Precaution Booklet Issued: Yes (comment) Precaution Comments: despite education x3 pt with 0/3 precaution recall at end of session  Restrictions Weight Bearing Restrictions: Yes RLE Weight Bearing: Weight bearing as tolerated    Mobility  Bed Mobility Overal bed mobility: Needs Assistance Bed Mobility: Supine to Sit     Supine to sit: Mod assist;HOB elevated     General bed mobility comments: modA for moving LE across bed while maintaining posterior hip precautions, vc for need to keep hip abducted, ER, and not to flex past 90 degrees, hand hold assist to bring hips to EoB  Transfers Overall transfer level: Needs assistance Equipment used: Rolling walker (2 wheeled) Transfers: Sit to/from Stand Sit to Stand: Mod  assist;+2 physical assistance         General transfer comment: modAx2 for power up to RW, able to utilize RW correctly today, vc for hand placement   Ambulation/Gait Ambulation/Gait assistance: Min assist;+2 safety/equipment Gait Distance (Feet): 12 Feet Assistive device: Rolling walker (2 wheeled) Gait Pattern/deviations: Step-to pattern;Decreased weight shift to right;Trunk flexed Gait velocity: slowed Gait velocity interpretation: <1.31 ft/sec, indicative of household ambulator General Gait Details: minAx2 for steadying with RW transitioning to minAx1 with close chair follow, as she fatigued pt began to exhibit R knee buckling.      Balance Overall balance assessment: Needs assistance Sitting-balance support: Feet supported;No upper extremity supported Sitting balance-Leahy Scale: Fair     Standing balance support: Bilateral upper extremity supported Standing balance-Leahy Scale: Poor                              Cognition Arousal/Alertness: Awake/alert Behavior During Therapy: WFL for tasks assessed/performed Overall Cognitive Status: No family/caregiver present to determine baseline cognitive functioning                                 General Comments: re educated on posterior hip precautions, no carry over from yesterday          General Comments General comments (skin integrity, edema, etc.): VSS, daughter present during session inquiring about CIR placement, after witnessing pt mobility daughter reluctantly agrees to SNF      Pertinent Vitals/Pain Pain Assessment: 0-10 Pain Score: 0-No pain Pain Location: R hip Pain  Descriptors / Indicators: Guarding;Grimacing;Operative site guarding Pain Intervention(s): Limited activity within patient's tolerance;Monitored during session;Repositioned           PT Goals (current goals can now be found in the care plan section) Acute Rehab PT Goals Patient Stated Goal: get back to water  exercise PT Goal Formulation: With patient Time For Goal Achievement: 05/17/18 Progress towards PT goals: Progressing toward goals    Frequency    Min 5X/week      PT Plan Current plan remains appropriate       AM-PAC PT "6 Clicks" Daily Activity  Outcome Measure  Difficulty turning over in bed (including adjusting bedclothes, sheets and blankets)?: Unable Difficulty moving from lying on back to sitting on the side of the bed? : Unable Difficulty sitting down on and standing up from a chair with arms (e.g., wheelchair, bedside commode, etc,.)?: Unable Help needed moving to and from a bed to chair (including a wheelchair)?: A Lot Help needed walking in hospital room?: A Little Help needed climbing 3-5 steps with a railing? : Total 6 Click Score: 9    End of Session Equipment Utilized During Treatment: Gait belt Activity Tolerance: Patient limited by pain;Patient limited by fatigue Patient left: in chair;with call bell/phone within reach;with chair alarm set;Other (comment)(with pillow inbetween legs to maintain posterior hip precaut) Nurse Communication: Mobility status;Precautions PT Visit Diagnosis: Unsteadiness on feet (R26.81);Other abnormalities of gait and mobility (R26.89);History of falling (Z91.81);Muscle weakness (generalized) (M62.81);Difficulty in walking, not elsewhere classified (R26.2);Pain Pain - Right/Left: Right Pain - part of body: Hip     Time: 4098-1191 PT Time Calculation (min) (ACUTE ONLY): 28 min  Charges:  $Gait Training: 8-22 mins $Therapeutic Activity: 8-22 mins                     Garan Frappier B. Beverely Risen PT, DPT Acute Rehabilitation Services Pager [phone] Office [phone]    Elon Alas Fleet 05/04/2018, 3:20 PM

## 2018-05-04 NOTE — Progress Notes (Signed)
CSW called [facility], lvm with admissions. CSW inquiring of information regarding services provided, if facility offers PT/OT at the ALF side, and if patient would be able to return to receive those services when medically stable to discharge.   CSW will continue to follow up.   [place], [place] [phone]

## 2018-05-04 NOTE — Progress Notes (Signed)
OT Cancellation Note  Patient Details Name: Tracey Porter MRN: [id number] DOB: 10-14-1929   Cancelled Treatment:    Reason Eval/Treat Not Completed: Other (comment). Pt has discharge orders and will be going to SNF - will defer OT evaluation to next venue of care. Should the situation change please feel free to re-order.   Evern Bio Delan Ksiazek 05/04/2018, 5:41 PM  Sherryl Manges OTR/L Acute Rehabilitation Services Pager: [phone] Office: [phone]

## 2018-05-04 NOTE — Care Management Important Message (Signed)
Important Message  Patient Details  Name: Tracey Porter MRN: [id number] Date of Birth: 06-10-1930   Medicare Important Message Given:  Yes    Leanore Biggers 05/04/2018, 11:55 AM

## 2018-05-04 NOTE — Discharge Summary (Signed)
Physician Discharge Summary  Tracey Porter UYQ:[id number] DOB: 1930-06-04 DOA: 04/30/2018  PCP: Florentina Jenny, MD  Admit date: 04/30/2018 Discharge date: 05/04/2018  Admitted From: ALF- [facility] Disposition:  SNF  Recommendations for Outpatient Follow-up:  1. Follow up with PCP in 1-2 weeks 2. Please obtain BMP/CBC in one week your next doctors visit.  3. Follow-up with orthopedic, Dr. Jena Gauss in 1-2 weeks 4. Resume taking Eliquis 5. Pain medication-Norco has been prescribed to be taken for moderate to severe pain along with bowel regimen to have 1-2 soft bowel movements daily.  Discharge Condition: Stable CODE STATUS: Full code Diet recommendation: 2 g salt/cardiac  Brief/Interim Summary: 84 year old with history of essential hypertension, hyperlipidemia, paroxysmal atrial fibrillation on Eliquis, diastolic CHF, paroxysmal SVT came to the hospital with right hip pain.  She was found to have acute right subcapital femoral neck fracture with old inferior pubic rami fracture.  Orthopedic was consulted.  Her Eliquis was held. Right hip arthroplasty on 10/23, she tolerated this procedure well.  She was also evaluated by physical therapy who recommended skilled nursing facility therefore arrangements were made with the assistance of the social worker.  She is cleared to resume her Eliquis.  She will be discharged today to the skilled nursing facility for further rehabilitation. She has reached maximum benefit from hospital stay and stable to be discharged.  Discharge Diagnoses:  Principal Problem:   Hip fracture (HCC) Active Problems:   Non-insulin treated type 2 diabetes mellitus (HCC)   Paroxysmal SVT (supraventricular tachycardia) (HCC)   Essential hypertension   Chronic anticoagulation   Chronic diastolic heart failure (HCC)   PAF (paroxysmal atrial fibrillation) (HCC)   Dyslipidemia  Displaced right hip fracture -  Status post right hip arthroplasty 05/03/2018.   Tolerated the procedure well.  Plan to resume Eliquis starting today. -Divided by physical therapy who recommended skilled nursing facility therefore arrangements are to be made. -Follow-up outpatient with orthopedic in 2-3 weeks for x-ray and suture removal. Plans for right hip arthroplasty today, currently Eliquis on hold.  Will resume once cleared by surgery. - PT/OT starting tomorrow -Pain control, incentive spirometry - Chemoprophylaxis per surgery  Paroxysmal atrial fibrillation -Continue patient's amiodarone and beta-blocker.  Eliquis can be resumed once cleared by orthopedic  Diet-controlled diabetes mellitus type 2 -Most recent hemoglobin A1c 6.2.  Continue Accu-Cheks and sliding scale  History of GERD -Continue Protonix 40 mg daily  Essential hypertension - Continue Toprol-XL.  Eliquis to be resumed Discharge to skilled nursing facility Full code.  Discharge Instructions   Allergies as of 05/04/2018      Reactions   Penicillins Rash      Medication List    TAKE these medications   acyclovir 800 MG tablet Commonly known as:  ZOVIRAX Take 800 mg by mouth 2 (two) times daily.   amiodarone 200 MG tablet Commonly known as:  PACERONE Take 1 tablet (200 mg total) by mouth daily. What changed:  when to take this   apixaban 5 MG Tabs tablet Commonly known as:  ELIQUIS Take 1 tablet (5 mg total) by mouth 2 (two) times daily. Please cancel previous rx's sent; call pt with price before filling   cephALEXin 500 MG capsule Commonly known as:  KEFLEX Take 1 capsule (500 mg total) by mouth 3 (three) times daily.   D-3-5 5000 units capsule Generic drug:  Cholecalciferol Take 5,000 Units by mouth daily.   diltiazem 360 MG 24 hr capsule Commonly known as:  CARDIZEM CD TAKE 1 CAPSULE  BY MOUTH  DAILY   FORTEO [place] Inject 20 mcg daily into the skin.   GLUCOSAMINE-CHONDROIT-VIT C-MN PO Take 1 tablet by mouth daily.   HYDROcodone-acetaminophen 5-325 MG  tablet Commonly known as:  NORCO/VICODIN Take 1-2 tablets by mouth every 6 (six) hours as needed for up to 5 days for moderate pain or severe pain.   ibuprofen 600 MG tablet Commonly known as:  ADVIL,MOTRIN Take 1 tablet (600 mg total) by mouth every 6 (six) hours as needed. What changed:  reasons to take this   ICAPS PLUS Tabs Take 1 tablet by mouth 2 (two) times daily.   labetalol 200 MG tablet Commonly known as:  NORMODYNE TAKE 1.5 TABLETS BY MOUTH  EVERY MORNING AND TAKE 1  TABLET BY MOUTH BY MOUTH  EVERY EVENING.   loperamide 2 MG capsule Commonly known as:  IMODIUM Take 1 capsule (2 mg total) by mouth as needed for diarrhea or loose stools.   metoprolol succinate 25 MG 24 hr tablet Commonly known as:  TOPROL-XL Take 0.5 tablets (12.5 mg total) by mouth at bedtime.   MYRBETRIQ 25 MG Tb24 tablet Generic drug:  mirabegron ER Take 25 mg by mouth daily.   nitroGLYCERIN 0.4 MG SL tablet Commonly known as:  NITROSTAT Place 0.4 mg under the tongue every 5 (five) minutes as needed for chest pain.   pantoprazole 40 MG tablet Commonly known as:  PROTONIX Take 40 mg by mouth daily.   polyethylene glycol packet Commonly known as:  MIRALAX / GLYCOLAX Take 17 g by mouth daily.   prednisoLONE acetate 1 % ophthalmic suspension Commonly known as:  PRED FORTE Place 1 drop into the right eye 4 (four) times daily.   PROBIOTIC PO Take 1 capsule by mouth daily.   senna-docusate 8.6-50 MG tablet Commonly known as:  Senokot-S Take 2 tablets by mouth daily as needed for mild constipation.   SLOW-MAG 64 MG Tbec SR tablet Generic drug:  magnesium chloride Take 1 tablet by mouth 2 (two) times daily.   SYSTANE 0.4-0.3 % Soln Generic drug:  Polyethyl Glycol-Propyl Glycol Apply 1 drop to eye 2 (two) times daily.   timolol 0.5 % ophthalmic solution Commonly known as:  BETIMOL Place 1 drop into both eyes 2 (two) times daily.   vitamin B-12 1000 MCG tablet Commonly known as:   CYANOCOBALAMIN Take 1,000 mcg by mouth daily.   vitamin C 500 MG tablet Commonly known as:  ASCORBIC ACID Take 500 mg by mouth 2 (two) times daily.      Follow-up Information    Florentina Jenny, MD. Schedule an appointment as soon as possible for a visit in 1 week(s).   Specialty:  Family Medicine Contact information: [address] [place] [address] [phone]        HaddixGillie Manners, MD. Schedule an appointment as soon as possible for a visit in 2 week(s).   Specialty:  Orthopedic Surgery Contact information: [address] [place] [address] [phone]          Allergies  Allergen Reactions  . Penicillins Rash    You were cared for by a hospitalist during your hospital stay. If you have any questions about your discharge medications or the care you received while you were in the hospital after you are discharged, you can call the unit and asked to speak with the hospitalist on call if the hospitalist that took care of you is not available. Once you are discharged,  your primary care physician will handle any further medical issues. Please note that no refills for any discharge medications will be authorized once you are discharged, as it is imperative that you return to your primary care physician (or establish a relationship with a primary care physician if you do not have one) for your aftercare needs so that they can reassess your need for medications and monitor your lab values.  Consultations:  Orthopedic    Procedures/Studies: Ct Head Wo Contrast  Result Date: 04/30/2018 CLINICAL DATA:  Posttraumatic headache EXAM: CT HEAD WITHOUT CONTRAST TECHNIQUE: Contiguous axial images were obtained from the base of the skull through the vertex without intravenous contrast. COMPARISON:  03/29/2017 FINDINGS: Brain: No evidence of acute infarction, hemorrhage, hydrocephalus, extra-axial collection or mass lesion/mass effect. Generalized atrophy  is mild to moderate for age. Mild chronic small vessel ischemic change in the cerebral white matter Vascular: No hyperdense vessel or unexpected calcification. Skull: Negative for fracture Sinuses/Orbits: No evidence of injury IMPRESSION: No evidence of intracranial injury. Electronically Signed   By: Marnee Spring M.D.   On: 04/30/2018 15:40   Dg Pelvis Portable  Result Date: 05/03/2018 CLINICAL DATA:  Right hip hemiarthroplasty. EXAM: PORTABLE PELVIS 1-2 VIEWS COMPARISON:  03/23/2017 FINDINGS: New right hip hemiarthroplasty appears well-seated and aligned. No acute fracture or evidence of an operative complication. Old pubic rami fractures bilaterally. IMPRESSION: 1. Well-positioned right hip hemiarthroplasty. Electronically Signed   By: Amie Portland M.D.   On: 05/03/2018 14:12   Dg Hip Unilat  With Pelvis 2-3 Views Right  Result Date: 04/30/2018 CLINICAL DATA:  Slid out of chair. Fall. Right hip pain. Deformity to the right lower extremity. EXAM: DG HIP (WITH OR WITHOUT PELVIS) 2-3V RIGHT COMPARISON:  None. FINDINGS: A right subcapital femoral neck fracture is displaced superiorly with varus angulation. Bilateral superior and inferior pubic rami fractures appear remote. Moderate osteopenia is present. No additional fractures are present. IMPRESSION: 1. Acute right subcapital femoral neck fracture. 2. Remote bilateral superior and inferior pubic rami fractures. Electronically Signed   By: Marin Roberts M.D.   On: 04/30/2018 14:37      Subjective: No complaints besides weakness post op. Tolerating PO  General = no fevers, chills, dizziness, malaise, fatigue HEENT/EYES = negative for pain, redness, loss of vision, double vision, blurred vision, loss of hearing, sore throat, hoarseness, dysphagia Cardiovascular= negative for chest pain, palpitation, murmurs, lower extremity swelling Respiratory/lungs= negative for shortness of breath, cough, hemoptysis, wheezing, mucus  production Gastrointestinal= negative for nausea, vomiting,, abdominal pain, melena, hematemesis Genitourinary= negative for Dysuria, Hematuria, Change in Urinary Frequency MSK = Negative for arthralgia, myalgias, Back Pain, Joint swelling  Neurology= Negative for headache, seizures, numbness, tingling  Psychiatry= Negative for anxiety, depression, suicidal and homocidal ideation Allergy/Immunology= Medication/Food allergy as listed  Skin= Negative for Rash, lesions, ulcers, itching   Discharge Exam: Vitals:   05/04/18 0355 05/04/18 0850  BP: (!) 132/57 (!) 111/50  Pulse: 71 76  Resp:  16  Temp: 97.7 F (36.5 C) 99.3 F (37.4 C)  SpO2: 98% 100%   Vitals:   05/03/18 2002 05/04/18 0003 05/04/18 0355 05/04/18 0850  BP: (!) 113/51 (!) 124/52 (!) 132/57 (!) 111/50  Pulse: 72 68 71 76  Resp: 18 18  16   Temp: 98.1 F (36.7 C) 98.3 F (36.8 C) 97.7 F (36.5 C) 99.3 F (37.4 C)  TempSrc: Oral Oral Oral Oral  SpO2: 100% 96% 98% 100%  Weight:      Height:  General: Pt is alert, awake, not in acute distress, elderly frail-appearing Cardiovascular: RRR, S1/S2 +, no rubs, no gallops Respiratory: CTA bilaterally, no wheezing, no rhonchi Abdominal: Soft, NT, ND, bowel sounds + Extremities: no edema, no cyanosis Left hip dressing noted without any signs of active bleeding or infection.   The results of significant diagnostics from this hospitalization (including imaging, microbiology, ancillary and laboratory) are listed below for reference.     Microbiology: Recent Results (from the past 240 hour(s))  Surgical pcr screen     Status: None   Collection Time: 04/30/18 10:04 PM  Result Value Ref Range Status   MRSA, PCR NEGATIVE NEGATIVE Final   Staphylococcus aureus NEGATIVE NEGATIVE Final    Comment: (NOTE) The Xpert SA Assay (FDA approved for NASAL specimens in patients 71 years of age and older), is one component of a comprehensive surveillance program. It is not  intended to diagnose infection nor to guide or monitor treatment. Performed at [facility], [address] [place], [place] [address]      Labs: BNP (last 3 results) No results for input(s): BNP in the last 8760 hours. Basic Metabolic Panel: Recent Labs  Lab 04/30/18 1437 05/01/18 0502 05/03/18 0000 05/04/18 0238  NA 137 135 133* 134*  K 4.2 3.8 4.0 3.5  CL 101 100 99 97*  CO2 26 28 27 27   GLUCOSE 103* 124* 120* 136*  BUN 21 13 18 14   CREATININE 0.65 0.60 0.71 0.64  CALCIUM 9.3 8.9 8.6* 8.5*  MG  --   --  1.8 1.8   Liver Function Tests: Recent Labs  Lab 05/01/18 0502  AST 21  ALT 21  ALKPHOS 65  BILITOT 0.6  PROT 6.4*  ALBUMIN 3.2*   No results for input(s): LIPASE, AMYLASE in the last 168 hours. No results for input(s): AMMONIA in the last 168 hours. CBC: Recent Labs  Lab 04/30/18 1437 05/02/18 2343 05/04/18 0238  WBC 9.6 12.2* 14.4*  NEUTROABS 8.1*  --   --   HGB 12.8 11.1* 10.5*  HCT 40.4 33.8* 33.1*  MCV 100.5* 97.4 98.5  PLT 272 243 288   Cardiac Enzymes: No results for input(s): CKTOTAL, CKMB, CKMBINDEX, TROPONINI in the last 168 hours. BNP: Invalid input(s): POCBNP CBG: Recent Labs  Lab 05/03/18 0624 05/03/18 1144 05/03/18 1644 05/03/18 2216 05/04/18 0626  GLUCAP 99 113* 177* 150* 159*   D-Dimer No results for input(s): DDIMER in the last 72 hours. Hgb A1c No results for input(s): HGBA1C in the last 72 hours. Lipid Profile No results for input(s): CHOL, HDL, LDLCALC, TRIG, CHOLHDL, LDLDIRECT in the last 72 hours. Thyroid function studies No results for input(s): TSH, T4TOTAL, T3FREE, THYROIDAB in the last 72 hours.  Invalid input(s): FREET3 Anemia work up No results for input(s): VITAMINB12, FOLATE, FERRITIN, TIBC, IRON, RETICCTPCT in the last 72 hours. Urinalysis    Component Value Date/Time   COLORURINE YELLOW 02/22/2018 2003   APPEARANCEUR CLEAR 02/22/2018 2003   LABSPEC 1.011 02/22/2018 2003   PHURINE 6.0  02/22/2018 2003   GLUCOSEU NEGATIVE 02/22/2018 2003   HGBUR NEGATIVE 02/22/2018 2003   BILIRUBINUR NEGATIVE 02/22/2018 2003   KETONESUR NEGATIVE 02/22/2018 2003   PROTEINUR NEGATIVE 02/22/2018 2003   UROBILINOGEN 0.2 03/19/2015 1945   NITRITE NEGATIVE 02/22/2018 2003   LEUKOCYTESUR SMALL (A) 02/22/2018 2003   Sepsis Labs Invalid input(s): PROCALCITONIN,  WBC,  LACTICIDVEN Microbiology Recent Results (from the past 240 hour(s))  Surgical pcr screen     Status: None  Collection Time: 04/30/18 10:04 PM  Result Value Ref Range Status   MRSA, PCR NEGATIVE NEGATIVE Final   Staphylococcus aureus NEGATIVE NEGATIVE Final    Comment: (NOTE) The Xpert SA Assay (FDA approved for NASAL specimens in patients 75 years of age and older), is one component of a comprehensive surveillance program. It is not intended to diagnose infection nor to guide or monitor treatment. Performed at [facility], [address], [place], [place] [address]      Time coordinating discharge:  I have spent 35 minutes face to face with the patient and on the ward discussing the patients care, assessment, plan and disposition with other care givers. >50% of the time was devoted counseling the patient about the risks and benefits of treatment/Discharge disposition and coordinating care.   SIGNED:   Dimple Nanas, MD  [facility] 05/04/2018, 11:37 AM Pager   If 7PM-7AM, please contact night-coverage [email or web address] Password [email or web address]

## 2018-05-04 NOTE — Progress Notes (Signed)
Orthopaedic Trauma Progress Note  S: Doing well, pain controlled. Confused this AM.  O:  Vitals:   05/04/18 0003 05/04/18 0355  BP: (!) 124/52 (!) 132/57  Pulse: 68 71  Resp: 18   Temp: 98.3 F (36.8 C) 97.7 F (36.5 C)  SpO2: 96% 98%   NAD, Awake and alert, slightly confused RLE: Hip abduction pillow in place. Swelling appropriate. Dressing clean, dry and intact. +TA/EHL/GSC. Warm and well perfused foot.  Imaging: Stable postop imaging  Labs:  Results for orders placed or performed during the hospital encounter of 04/30/18 (from the past 24 hour(s))  Glucose, capillary     Status: Abnormal   Collection Time: 05/03/18 11:44 AM  Result Value Ref Range   Glucose-Capillary 113 (H) 70 - 99 mg/dL   Comment 1 Notify RN    Comment 2 Document in Chart   Glucose, capillary     Status: Abnormal   Collection Time: 05/03/18  4:44 PM  Result Value Ref Range   Glucose-Capillary 177 (H) 70 - 99 mg/dL  Glucose, capillary     Status: Abnormal   Collection Time: 05/03/18 10:16 PM  Result Value Ref Range   Glucose-Capillary 150 (H) 70 - 99 mg/dL  Basic metabolic panel     Status: Abnormal   Collection Time: 05/04/18  2:38 AM  Result Value Ref Range   Sodium 134 (L) 135 - 145 mmol/L   Potassium 3.5 3.5 - 5.1 mmol/L   Chloride 97 (L) 98 - 111 mmol/L   CO2 27 22 - 32 mmol/L   Glucose, Bld 136 (H) 70 - 99 mg/dL   BUN 14 8 - 23 mg/dL   Creatinine, Ser 1.30 0.44 - 1.00 mg/dL   Calcium 8.5 (L) 8.9 - 10.3 mg/dL   GFR calc non Af Amer >60 >60 mL/min   GFR calc Af Amer >60 >60 mL/min   Anion gap 10 5 - 15  Magnesium     Status: None   Collection Time: 05/04/18  2:38 AM  Result Value Ref Range   Magnesium 1.8 1.7 - 2.4 mg/dL  CBC     Status: Abnormal   Collection Time: 05/04/18  2:38 AM  Result Value Ref Range   WBC 14.4 (H) 4.0 - 10.5 K/uL   RBC 3.36 (L) 3.87 - 5.11 MIL/uL   Hemoglobin 10.5 (L) 12.0 - 15.0 g/dL   HCT 86.5 (L) 78.4 - 69.6 %   MCV 98.5 80.0 - 100.0 fL   MCH 31.3 26.0 -  34.0 pg   MCHC 31.7 30.0 - 36.0 g/dL   RDW 29.5 28.4 - 13.2 %   Platelets 288 150 - 400 K/uL   nRBC 0.0 0.0 - 0.2 %  Glucose, capillary     Status: Abnormal   Collection Time: 05/04/18  6:26 AM  Result Value Ref Range   Glucose-Capillary 159 (H) 70 - 99 mg/dL    Assessment: 84 year old female s/p fall  Injuries: Right femoral neck fracture s/p hemiarthropalsty   Weightbearing: Weightbearing as tolerated right lower extremity with posterior hip precautions  Insicional and dressing care: Dressing change POD2 or 3  Orthopedic device(s):Hip abduction pillow while in bed  CV/Blood loss:Stable, Hgb 10.4 this AM. No need for current transfusion  Pain management: 1. Norco 1-2 tabs q 6 hours PRN 2. Morphine 2 mg q 4 hours PRN  VTE prophylaxis: Okay to restart Eliquis today  ID: Ancef 2 gm postoperatively  Foley/Lines: KVO IVF  Medical co-morbidities: A fib-Eliquis being held,  restart Today 2. Diabetes-SSI  Dispo: TBD, likely SNF  Follow - up plan: Follow up in 2-3 weeks for x-rays and suture removal   Roby Lofts, MD [facility] [phone] (phone)

## 2018-05-05 LAB — CBC
HCT: 29.7 % — ABNORMAL LOW (ref 36.0–46.0)
Hemoglobin: 9.6 g/dL — ABNORMAL LOW (ref 12.0–15.0)
MCH: 31.3 pg (ref 26.0–34.0)
MCHC: 32.3 g/dL (ref 30.0–36.0)
MCV: 96.7 fL (ref 80.0–100.0)
Platelets: 346 10*3/uL (ref 150–400)
RBC: 3.07 MIL/uL — AB (ref 3.87–5.11)
RDW: 13.1 % (ref 11.5–15.5)
WBC: 14 10*3/uL — ABNORMAL HIGH (ref 4.0–10.5)
nRBC: 0 % (ref 0.0–0.2)

## 2018-05-05 LAB — BASIC METABOLIC PANEL
Anion gap: 10 (ref 5–15)
BUN: 13 mg/dL (ref 8–23)
CALCIUM: 8.6 mg/dL — AB (ref 8.9–10.3)
CO2: 27 mmol/L (ref 22–32)
CREATININE: 0.63 mg/dL (ref 0.44–1.00)
Chloride: 98 mmol/L (ref 98–111)
GFR calc Af Amer: >60 mL/min (ref >60)
GLUCOSE: 118 mg/dL — AB (ref 70–99)
Potassium: 3.8 mmol/L (ref 3.5–5.1)
SODIUM: 135 mmol/L (ref 135–145)

## 2018-05-05 LAB — GLUCOSE, CAPILLARY
GLUCOSE-CAPILLARY: 95 mg/dL (ref 70–99)
Glucose-Capillary: 108 mg/dL — ABNORMAL HIGH (ref 70–99)
Glucose-Capillary: 87 mg/dL (ref 70–99)

## 2018-05-05 LAB — MAGNESIUM: MAGNESIUM: 1.7 mg/dL (ref 1.7–2.4)

## 2018-05-05 MED ORDER — SENNOSIDES-DOCUSATE SODIUM 8.6-50 MG PO TABS
1.0000 | ORAL_TABLET | Freq: Two times a day (BID) | ORAL | Status: DC
  Administered 2018-05-05: 1 via ORAL
  Filled 2018-05-05: qty 1

## 2018-05-05 MED ORDER — POLYETHYLENE GLYCOL 3350 17 G PO PACK
17.0000 g | PACK | Freq: Every day | ORAL | Status: DC
  Administered 2018-05-05: 17 g via ORAL
  Filled 2018-05-05: qty 1

## 2018-05-05 MED ORDER — DOCUSATE SODIUM 100 MG PO CAPS
100.0000 mg | ORAL_CAPSULE | Freq: Two times a day (BID) | ORAL | Status: DC
  Administered 2018-05-05: 100 mg via ORAL
  Filled 2018-05-05: qty 1

## 2018-05-05 NOTE — Discharge Instructions (Signed)
Orthopaedic Trauma Service Discharge Instructions   General Discharge Instructions  WEIGHT BEARING STATUS: Weightbearing as tolerated R leg   RANGE OF MOTION/ACTIVITY: posterior hip precautions R hip   Wound Care: daily dressing changes starting on 05/06/2018. See below   Discharge Wound Care Instructions  Do NOT apply any ointments, solutions or lotions to pin sites or surgical wounds.  These prevent needed drainage and even though solutions like hydrogen peroxide kill bacteria, they also damage cells lining the pin sites that help fight infection.  Applying lotions or ointments can keep the wounds moist and can cause them to breakdown and open up as well. This can increase the risk for infection. When in doubt call the office.  Surgical incisions should be dressed daily.  If any drainage is noted, use one layer of adaptic, then gauze, Kerlix, and an ace wrap.  Once the incision is completely dry and without drainage, it may be left open to air out.  Showering may begin 36-48 hours later.  Cleaning gently with soap and water.  Traumatic wounds should be dressed daily as well.    One layer of adaptic, gauze, Kerlix, then ace wrap.  The adaptic can be discontinued once the draining has ceased    If you have a wet to dry dressing: wet the gauze with saline the squeeze as much saline out so the gauze is moist (not soaking wet), place moistened gauze over wound, then place a dry gauze over the moist one, followed by Kerlix wrap, then ace wrap.   DVT/PE prophylaxis: on eliquis chronically   Diet: as you were eating previously.  Can use over the counter stool softeners and bowel preparations, such as Miralax, to help with bowel movements.  Narcotics can be constipating.  Be sure to drink plenty of fluids  PAIN MEDICATION USE AND EXPECTATIONS  You have likely been given narcotic medications to help control your pain.  After a traumatic event that results in an fracture (broken bone) with or  without surgery, it is ok to use narcotic pain medications to help control one's pain.  We understand that everyone responds to pain differently and each individual patient will be evaluated on a regular basis for the continued need for narcotic medications. Ideally, narcotic medication use should last no more than 6-8 weeks (coinciding with fracture healing).   As a patient it is your responsibility as well to monitor narcotic medication use and report the amount and frequency you use these medications when you come to your office visit.   We would also advise that if you are using narcotic medications, you should take a dose prior to therapy to maximize you participation.  IF YOU ARE ON NARCOTIC MEDICATIONS IT IS NOT PERMISSIBLE TO OPERATE A MOTOR VEHICLE (MOTORCYCLE/CAR/TRUCK/MOPED) OR HEAVY MACHINERY DO NOT MIX NARCOTICS WITH OTHER CNS (CENTRAL NERVOUS SYSTEM) DEPRESSANTS SUCH AS ALCOHOL   STOP SMOKING OR USING NICOTINE PRODUCTS!!!!  As discussed nicotine severely impairs your body's ability to heal surgical and traumatic wounds but also impairs bone healing.  Wounds and bone heal by forming microscopic blood vessels (angiogenesis) and nicotine is a vasoconstrictor (essentially, shrinks blood vessels).  Therefore, if vasoconstriction occurs to these microscopic blood vessels they essentially disappear and are unable to deliver necessary nutrients to the healing tissue.  This is one modifiable factor that you can do to dramatically increase your chances of healing your injury.    (This means no smoking, no nicotine gum, patches, etc)  DO NOT USE NONSTEROIDAL  ANTI-INFLAMMATORY DRUGS (NSAID'S)  Using products such as Advil (ibuprofen), Aleve (naproxen), Motrin (ibuprofen) for additional pain control during fracture healing can delay and/or prevent the healing response.  If you would like to take over the counter (OTC) medication, Tylenol (acetaminophen) is ok.  However, some narcotic medications that  are given for pain control contain acetaminophen as well. Therefore, you should not exceed more than 4000 mg of tylenol in a day if you do not have liver disease.  Also note that there are may OTC medicines, such as cold medicines and allergy medicines that my contain tylenol as well.  If you have any questions about medications and/or interactions please ask your doctor/PA or your pharmacist.      ICE AND ELEVATE INJURED/OPERATIVE EXTREMITY  Using ice and elevating the injured extremity above your heart can help with swelling and pain control.  Icing in a pulsatile fashion, such as 20 minutes on and 20 minutes off, can be followed.    Do not place ice directly on skin. Make sure there is a barrier between to skin and the ice pack.    Using frozen items such as frozen peas works well as the conform nicely to the are that needs to be iced.  USE AN ACE WRAP OR TED HOSE FOR SWELLING CONTROL  In addition to icing and elevation, Ace wraps or TED hose are used to help limit and resolve swelling.  It is recommended to use Ace wraps or TED hose until you are informed to stop.    When using Ace Wraps start the wrapping distally (farthest away from the body) and wrap proximally (closer to the body)   Example: If you had surgery on your leg or thing and you do not have a splint on, start the ace wrap at the toes and work your way up to the thigh        If you had surgery on your upper extremity and do not have a splint on, start the ace wrap at your fingers and work your way up to the upper arm  IF YOU ARE IN A SPLINT OR CAST DO NOT REMOVE IT FOR ANY REASON   If your splint gets wet for any reason please contact the office immediately. You may shower in your splint or cast as long as you keep it dry.  This can be done by wrapping in a cast cover or garbage back (or similar)  Do Not stick any thing down your splint or cast such as pencils, money, or hangers to try and scratch yourself with.  If you feel itchy  take benadryl as prescribed on the bottle for itching  IF YOU ARE IN A CAM BOOT (BLACK BOOT)  You may remove boot periodically. Perform daily dressing changes as noted below.  Wash the liner of the boot regularly and wear a sock when wearing the boot. It is recommended that you sleep in the boot until told otherwise  CALL THE OFFICE WITH ANY QUESTIONS OR CONCERNS: [phone]

## 2018-05-05 NOTE — Progress Notes (Signed)
PROGRESS NOTE    Tracey Porter  ZOX:[id number] DOB: 1930/07/12 DOA: 04/30/2018 PCP: Florentina Jenny, MD   Brief Narrative:  84 year old with history of essential hypertension, hyperlipidemia, paroxysmal atrial fibrillation on Eliquis, diastolic CHF, paroxysmal SVT came to the hospital with right hip pain. She was found to have acute right subcapital femoral neck fracture with old inferior pubic rami fracture. Orthopedic was consulted. Her Eliquis was held. Right hip arthroplasty on 10/23, she tolerated this procedure well.  She was also evaluated by physical therapy who recommended skilled nursing facility therefore arrangements were made with the assistance of the social worker.  She is cleared to resume her Eliquis.  She will be discharged today to the skilled nursing facility for further rehabilitation.   Assessment & Plan:   Principal Problem:   Hip fracture (HCC) Active Problems:   Non-insulin treated type 2 diabetes mellitus (HCC)   Paroxysmal SVT (supraventricular tachycardia) (HCC)   Essential hypertension   Chronic anticoagulation   Chronic diastolic heart failure (HCC)   PAF (paroxysmal atrial fibrillation) (HCC)   Dyslipidemia  Displaced right hip status post arthroplasty 05/03/2018 -Patient tolerated procedure well, back on Eliquis now.  Physical therapy recommended skilled nursing facility therefore arrangements made. -Follow-up outpatient with orthopedic in 2-3 weeks for x-ray and suture removal  Paroxysmal atrial fibrillation -Continue amiodarone and beta-blocker.  Continue Eliquis  Diet-controlled diabetes mellitus type 2 -Recent hemoglobin A1c 6.2, continue Accu-Cheks and sliding scale  GERD -PPI daily  Essential hypertension -Continue Toprol-XL  DVT prophylaxis: Eliquis Code Status: Full code Family Communication: None at bedside Disposition Plan: Discharge today  Consultants:   Orthopedic  Procedures:   Right hip arthroplasty  05/03/2018  Antimicrobials:   Postoperative Ancef   Subjective: No complaints besides mild feeling of nausea.  No acute events overnight.  Tolerating oral diet as best she can  Review of Systems Otherwise negative except as per HPI, including: General = no fevers, chills, dizziness, malaise, fatigue HEENT/EYES = negative for pain, redness, loss of vision, double vision, blurred vision, loss of hearing, sore throat, hoarseness, dysphagia Cardiovascular= negative for chest pain, palpitation, murmurs, lower extremity swelling Respiratory/lungs= negative for shortness of breath, cough, hemoptysis, wheezing, mucus production Gastrointestinal= negative for nausea, vomiting,, abdominal pain, melena, hematemesis Genitourinary= negative for Dysuria, Hematuria, Change in Urinary Frequency MSK = Negative for arthralgia, myalgias, Back Pain, Joint swelling  Neurology= Negative for headache, seizures, numbness, tingling  Psychiatry= Negative for anxiety, depression, suicidal and homocidal ideation Allergy/Immunology= Medication/Food allergy as listed  Skin= Negative for Rash, lesions, ulcers, itching   Objective: Vitals:   05/04/18 0850 05/04/18 1443 05/04/18 2004 05/05/18 0429  BP: (!) 111/50 123/64 139/71 134/73  Pulse: 76 77 78 72  Resp: 16 17 14 16   Temp: 99.3 F (37.4 C) 98.3 F (36.8 C) 98.4 F (36.9 C) 97.6 F (36.4 C)  TempSrc: Oral Oral Oral Oral  SpO2: 100% 99% 98% 100%  Weight:      Height:        Intake/Output Summary (Last 24 hours) at 05/05/2018 1059 Last data filed at 05/04/2018 1500 Gross per 24 hour  Intake 240 ml  Output 650 ml  Net -410 ml   Filed Weights   04/30/18 1931 04/30/18 2300 05/03/18 0749  Weight: 68 kg 61.2 kg 61 kg    Examination:  Constitutional: NAD, calm, comfortable Eyes: PERRL, lids and conjunctivae normal ENMT: Mucous membranes are moist. Posterior pharynx clear of any exudate or lesions.Normal dentition.  Neck: normal, supple, no  masses, no  thyromegaly Respiratory: clear to auscultation bilaterally, no wheezing, no crackles. Normal respiratory effort. No accessory muscle use.  Cardiovascular: Regular rate and rhythm, no murmurs / rubs / gallops. No extremity edema. 2+ pedal pulses. No carotid bruits.  Abdomen: no tenderness, no masses palpated. No hepatosplenomegaly. Bowel sounds positive.  Musculoskeletal: no clubbing / cyanosis. No joint deformity upper and lower extremities. Good ROM, no contractures. Normal muscle tone.  Limited range of motion of the right lower extremity recent surgery Skin: Dressing in the right lower extremity at the surgical site appears clean and intact without any evidence of bleeding or infection. Neurologic: CN 2-12 grossly intact. Sensation intact, DTR normal. Strength 4/5 in all 4.  Psychiatric: Normal judgment and insight. Alert and oriented x 3. Normal mood.      Data Reviewed:   CBC: Recent Labs  Lab 04/30/18 1437 05/02/18 2343 05/04/18 0238 05/04/18 2328  WBC 9.6 12.2* 14.4* 14.0*  NEUTROABS 8.1*  --   --   --   HGB 12.8 11.1* 10.5* 9.6*  HCT 40.4 33.8* 33.1* 29.7*  MCV 100.5* 97.4 98.5 96.7  PLT 272 243 288 346   Basic Metabolic Panel: Recent Labs  Lab 04/30/18 1437 05/01/18 0502 05/03/18 0000 05/04/18 0238 05/05/18 0005  NA 137 135 133* 134* 135  K 4.2 3.8 4.0 3.5 3.8  CL 101 100 99 97* 98  CO2 26 28 27 27 27   GLUCOSE 103* 124* 120* 136* 118*  BUN 21 13 18 14 13   CREATININE 0.65 0.60 0.71 0.64 0.63  CALCIUM 9.3 8.9 8.6* 8.5* 8.6*  MG  --   --  1.8 1.8 1.7   GFR: Estimated Creatinine Clearance: 41.2 mL/min (by C-G formula based on SCr of 0.63 mg/dL). Liver Function Tests: Recent Labs  Lab 05/01/18 0502  AST 21  ALT 21  ALKPHOS 65  BILITOT 0.6  PROT 6.4*  ALBUMIN 3.2*   No results for input(s): LIPASE, AMYLASE in the last 168 hours. No results for input(s): AMMONIA in the last 168 hours. Coagulation Profile: Recent Labs  Lab 04/30/18 1605   INR 1.20   Cardiac Enzymes: No results for input(s): CKTOTAL, CKMB, CKMBINDEX, TROPONINI in the last 168 hours. BNP (last 3 results) No results for input(s): PROBNP in the last 8760 hours. HbA1C: No results for input(s): HGBA1C in the last 72 hours. CBG: Recent Labs  Lab 05/04/18 1134 05/04/18 1633 05/04/18 2041 05/05/18 0645 05/05/18 0648  GLUCAP 120* 106* 106* 87 108*   Lipid Profile: No results for input(s): CHOL, HDL, LDLCALC, TRIG, CHOLHDL, LDLDIRECT in the last 72 hours. Thyroid Function Tests: No results for input(s): TSH, T4TOTAL, FREET4, T3FREE, THYROIDAB in the last 72 hours. Anemia Panel: No results for input(s): VITAMINB12, FOLATE, FERRITIN, TIBC, IRON, RETICCTPCT in the last 72 hours. Sepsis Labs: No results for input(s): PROCALCITON, LATICACIDVEN in the last 168 hours.  Recent Results (from the past 240 hour(s))  Surgical pcr screen     Status: None   Collection Time: 04/30/18 10:04 PM  Result Value Ref Range Status   MRSA, PCR NEGATIVE NEGATIVE Final   Staphylococcus aureus NEGATIVE NEGATIVE Final    Comment: (NOTE) The Xpert SA Assay (FDA approved for NASAL specimens in patients 19 years of age and older), is one component of a comprehensive surveillance program. It is not intended to diagnose infection nor to guide or monitor treatment. Performed at [facility], [address], [place], [place] [address]  Radiology Studies: Dg Pelvis Portable  Result Date: 05/03/2018 CLINICAL DATA:  Right hip hemiarthroplasty. EXAM: PORTABLE PELVIS 1-2 VIEWS COMPARISON:  03/23/2017 FINDINGS: New right hip hemiarthroplasty appears well-seated and aligned. No acute fracture or evidence of an operative complication. Old pubic rami fractures bilaterally. IMPRESSION: 1. Well-positioned right hip hemiarthroplasty. Electronically Signed   By: Amie Portland M.D.   On: 05/03/2018 14:12        Scheduled Meds: . acidophilus  1 capsule Oral Daily  .  acyclovir  800 mg Oral BID  . amiodarone  200 mg Oral BID  . apixaban  5 mg Oral BID  . insulin aspart  0-9 Units Subcutaneous TID WC  . metoprolol succinate  12.5 mg Oral QHS  . mirabegron ER  25 mg Oral Daily  . pantoprazole  40 mg Oral Daily  . polyvinyl alcohol  1 drop Both Eyes BID  . prednisoLONE acetate  1 drop Right Eye QID  . timolol  1 drop Both Eyes BID  . vitamin B-12  1,000 mcg Oral Daily   Continuous Infusions: . sodium chloride 250 mL (04/30/18 2313)  . lactated ringers 10 mL/hr at 05/03/18 0752     LOS: 5 days   Time spent= 20 mins    Ankit Joline Maxcy, MD [facility] Pager [phone]   If 7PM-7AM, please contact night-coverage [email or web address] Password [email or web address] 05/05/2018, 10:59 AM

## 2018-05-05 NOTE — Clinical Social Work Note (Signed)
Clinical Social Work Assessment  Patient Details  Name: Tracey Porter MRN: [id number] Date of Birth: [birth year]  Date of referral:  05/05/18               Reason for consult:  Discharge Planning                Permission sought to share information with:  Case Manager, Facility Medical sales representative, Family Supports Permission granted to share information::  Yes, Verbal Permission Granted  Name::     Conservation officer, historic buildings::  SNFs  Relationship::  daughter  Contact Information:  [phone]  Housing/Transportation Living arrangements for the past 2 months:  Independent Living Facility Source of Information:  Patient Patient Interpreter Needed:  None Criminal Activity/Legal Involvement Pertinent to Current Situation/Hospitalization:  No - Comment as needed Significant Relationships:    Lives with:  Adult Children Do you feel safe going back to the place where you live?  No Need for family participation in patient care:  Yes (Comment)  Care giving concerns:  CSW received referral for possible SNF placement at time of discharge. Spoke with patient regarding possibility of SNF placement . [facility] due to PT/OT and superversion needs. Patient can return once stronger. Patient and daughter Victorino Dike   expressed understanding of PT recommendation and are agreeable to SNF placement at time of discharge. CSW to continue to follow and assist with discharge planning needs.     Social Worker assessment / plan:  Spoke with patient and  Daughter Victorino Dike concerning possibility of rehab at [facility] before returning home.    Employment status:  Retired Health and safety inspector:  Medicare PT Recommendations:  Skilled Nursing Facility Information / Referral to community resources:  Skilled Nursing Facility  Patient/Family's Response to care:  Patient and  daughter recognize need for rehab before returning home and are agreeable to a SNF in [place]. They  report preference for  [facility]. CSW explained insurance authorization process. Patient's family reported that they want patient to get stronger to be able to come back home.    Patient/Family's Understanding of and Emotional Response to Diagnosis, Current Treatment, and Prognosis:  Patient/family is realistic regarding therapy needs and expressed being hopeful for SSNF placement. Patient expressed understanding of CSW role and discharge process as well as medical condition. No questions/concerns about plan or treatment.    Emotional Assessment Appearance:  Appears stated age Attitude/Demeanor/Rapport:  Gracious Affect (typically observed):  Accepting Orientation:  Oriented to Self, Oriented to  Time, Oriented to Place, Oriented to Situation Alcohol / Substance use:  Not Applicable Psych involvement (Current and /or in the community):  No (Comment)  Discharge Needs  Concerns to be addressed:  Discharge Planning Concerns Readmission within the last 30 days:  No Current discharge risk:  Dependent with Mobility Barriers to Discharge:  Continued Medical Work up   [employer], LCSW 05/05/2018, 11:07 AM

## 2018-05-05 NOTE — Clinical Social Work Placement (Signed)
   CLINICAL SOCIAL WORK PLACEMENT  NOTE  Date:  05/05/2018  Patient Details  Name: Tracey Porter MRN: [id number] Date of Birth: [birth year]  Clinical Social Work is seeking post-discharge placement for this patient at the Skilled  Nursing Facility level of care (*CSW will initial, date and re-position this form in  chart as items are completed):  Yes   Patient/family provided with [place] Clinical Social Work Department's list of facilities offering this level of care within the geographic area requested by the patient (or if unable, by the patient's family).  Yes   Patient/family informed of their freedom to choose among providers that offer the needed level of care, that participate in Medicare, Medicaid or managed care program needed by the patient, have an available bed and are willing to accept the patient.      Patient/family informed of [facility]'s ownership interest in [facility] and [facility], as well as of the fact that they are under no obligation to receive care at these facilities.  PASRR submitted to EDS on       PASRR number received on 05/05/18     Existing PASRR number confirmed on       FL2 transmitted to all facilities in geographic area requested by pt/family on 05/05/18     FL2 transmitted to all facilities within larger geographic area on 05/05/18     Patient informed that his/her managed care company has contracts with or will negotiate with certain facilities, including the following:        Yes   Patient/family informed of bed offers received.  Patient chooses bed at [facility]     Physician recommends and patient chooses bed at      Patient to be transferred to [facility] on 05/05/18.  Patient to be transferred to facility by PTAR     Patient family notified on 05/05/18 of transfer.  Name of family member notified:  Victorino Dike (daughter)      PHYSICIAN       Additional Comment:     _______________________________________________ Gildardo Griffes, LCSW 05/05/2018, 12:06 PM

## 2018-05-05 NOTE — Progress Notes (Signed)
Orthopedic Trauma Service Progress Note   Patient ID: Tracey Porter MRN: [id number] DOB/AGE: 1929/11/04 84 y.o.  Subjective:  Doing well No complaints snf today   ROS As above  Objective:   VITALS:   Vitals:   05/04/18 1443 05/04/18 2004 05/05/18 0429 05/05/18 1325  BP: 123/64 139/71 134/73 (!) 107/56  Pulse: 77 78 72 75  Resp: 17 14 16 16   Temp: 98.3 F (36.8 C) 98.4 F (36.9 C) 97.6 F (36.4 C) 98.5 F (36.9 C)  TempSrc: Oral Oral Oral Oral  SpO2: 99% 98% 100% 98%  Weight:      Height:        Estimated body mass index is 26.26 kg/m as calculated from the following:   Height as of this encounter: 5' (1.524 m).   Weight as of this encounter: 61 kg.   Intake/Output      10/24 0701 - 10/25 0700 10/25 0701 - 10/26 0700   P.O. 480    I.V. (mL/kg)     Total Intake(mL/kg) 480 (7.9)    Urine (mL/kg/hr) 650 (0.4)    Blood     Total Output 650    Net -170         Urine Occurrence 1 x      LABS  Results for orders placed or performed during the hospital encounter of 04/30/18 (from the past 24 hour(s))  Glucose, capillary     Status: Abnormal   Collection Time: 05/04/18  4:33 PM  Result Value Ref Range   Glucose-Capillary 106 (H) 70 - 99 mg/dL  Glucose, capillary     Status: Abnormal   Collection Time: 05/04/18  8:41 PM  Result Value Ref Range   Glucose-Capillary 106 (H) 70 - 99 mg/dL  CBC     Status: Abnormal   Collection Time: 05/04/18 11:28 PM  Result Value Ref Range   WBC 14.0 (H) 4.0 - 10.5 K/uL   RBC 3.07 (L) 3.87 - 5.11 MIL/uL   Hemoglobin 9.6 (L) 12.0 - 15.0 g/dL   HCT 40.9 (L) 81.1 - 91.4 %   MCV 96.7 80.0 - 100.0 fL   MCH 31.3 26.0 - 34.0 pg   MCHC 32.3 30.0 - 36.0 g/dL   RDW 78.2 95.6 - 21.3 %   Platelets 346 150 - 400 K/uL   nRBC 0.0 0.0 - 0.2 %  Basic metabolic panel     Status: Abnormal   Collection Time: 05/05/18 12:05 AM  Result Value Ref Range   Sodium 135 135 - 145 mmol/L   Potassium 3.8 3.5 - 5.1  mmol/L   Chloride 98 98 - 111 mmol/L   CO2 27 22 - 32 mmol/L   Glucose, Bld 118 (H) 70 - 99 mg/dL   BUN 13 8 - 23 mg/dL   Creatinine, Ser 0.86 0.44 - 1.00 mg/dL   Calcium 8.6 (L) 8.9 - 10.3 mg/dL   GFR calc non Af Amer >60 >60 mL/min   GFR calc Af Amer >60 >60 mL/min   Anion gap 10 5 - 15  Magnesium     Status: None   Collection Time: 05/05/18 12:05 AM  Result Value Ref Range   Magnesium 1.7 1.7 - 2.4 mg/dL  Glucose, capillary     Status: None   Collection Time: 05/05/18  6:45 AM  Result Value Ref Range   Glucose-Capillary 87 70 - 99 mg/dL  Glucose, capillary     Status: Abnormal   Collection Time: 05/05/18  6:48 AM  Result Value  Ref Range   Glucose-Capillary 108 (H) 70 - 99 mg/dL  Glucose, capillary     Status: None   Collection Time: 05/05/18 12:00 PM  Result Value Ref Range   Glucose-Capillary 95 70 - 99 mg/dL     PHYSICAL EXAM:   Gen: appears well, working with PT Ext:       Right Lower Extremity   Dressing c/d/i  No drainage on dressing noted at all   Distal motor and sensory functions intact  Ext warm   + DP pulse  No DCT  Compartments are soft  No significant swelling distally   Assessment/Plan: 2 Days Post-Op   Principal Problem:   Hip fracture (HCC) Active Problems:   Non-insulin treated type 2 diabetes mellitus (HCC)   Paroxysmal SVT (supraventricular tachycardia) (HCC)   Essential hypertension   Chronic anticoagulation   Chronic diastolic heart failure (HCC)   PAF (paroxysmal atrial fibrillation) (HCC)   Dyslipidemia   Anti-infectives (From admission, onward)   Start     Dose/Rate Route Frequency Ordered Stop   05/03/18 1700  ceFAZolin (ANCEF) IVPB 2g/100 mL premix     2 g 200 mL/hr over 30 Minutes Intravenous Every 8 hours 05/03/18 1235 05/04/18 0742   05/03/18 0944  vancomycin (VANCOCIN) powder  Status:  Discontinued       As needed 05/03/18 0944 05/03/18 1135   05/03/18 0841  ceFAZolin (ANCEF) 2-4 GM/100ML-% IVPB    Note to Pharmacy:   Aquilla Hacker   : cabinet override      05/03/18 0841 05/03/18 2044   05/01/18 0600  ceFAZolin (ANCEF) IVPB 2g/100 mL premix  Status:  Discontinued     2 g 200 mL/hr over 30 Minutes Intravenous On call to O.R. 04/30/18 2043 05/01/18 1158   04/30/18 2200  acyclovir (ZOVIRAX) tablet 800 mg     800 mg Oral 2 times daily 04/30/18 2048      .  POD/HD#: 2  84 y/o female s/p fall with R femoral neck fracture   -fall  - R femoral neck fracture s/p R hip hemiarthroplasty   WBAT   Posterior hip precautions  Dressing changes as needed starting on arrival to SNF  Ice prn   Continue with therapies   Follow up with OTS in 2-3 weeks   Hip abduction pillow when in bed    - Pain management:  Minimize narcotics  Continue with current regimen   - ABL anemia/Hemodynamics  Stable   - Medical issues   Per medicine   DM    A-fib   - DVT/PE prophylaxis:  eliquis    - Dispo:  Follow up with ortho in 2-3 weeks     Mearl Latin, PA-C [facility] [phone] Traci Sermon (C) 05/05/2018, 1:50 PM

## 2018-05-05 NOTE — Progress Notes (Signed)
Physical Therapy Treatment Patient Details Name: Tracey Porter MRN: [id number] DOB: 18-Sep-1929 Today's Date: 05/05/2018    History of Present Illness Pt is 84 y/o female s/p posterior approach hemiarthroplasty of R hip (05/03/2018) secondary to femoral neck fracture sustained (04/30/18) from a fall in ALF. Pt has history of complex pelvic ring injury 1 year prior, hypertension, hyperlipidemia, paroxysmal A. fib on Eliquis, chronic diastolic CHF, paroxysmal SVT.       PT Comments    PTAR in transit for imminent DC to SNF. In order to ensure some level of therapy today, pt completed bed exercises to improve hip mobility and decrease stiffness. DC plans still appropriate.   Follow Up Recommendations  SNF     Equipment Recommendations  None recommended by PT       Precautions / Restrictions Precautions Precautions: Posterior Hip Precaution Booklet Issued: Yes (comment) Precaution Comments: despite education,  pt with 0/3 precaution recall at beginning and end of session  Restrictions Weight Bearing Restrictions: Yes RLE Weight Bearing: Weight bearing as tolerated          Cognition Arousal/Alertness: Awake/alert Behavior During Therapy: WFL for tasks assessed/performed Overall Cognitive Status: No family/caregiver present to determine baseline cognitive functioning                                 General Comments: re educated on posterior hip precautions, no carry over from yesterday . Pt was able to recall not to bend over to pick something up, but unable to recall other 2 precautions.       Exercises  All exercises performed in supine: Ankle Pumps: AROM, 5 reps, (B) Heel Slides: AROM, 5 reps, (R) Hip Abduction: AAROM, 3 reps, (R) Quad Set: AROM, 5 reps (R) SAQ: AROM, 3 reps, (R), stopped d/t compensatory use of back musculature and pn  Pt educated on the importance of moving her R LE regularly to improve strength and decrease stiffness for better function.  Encouraged pt to complete exercises multiple times per day, as tolerable.       Pertinent Vitals/Pain Pain Assessment: 0-10 Pain Score: 4  Pain Location: R hip Pain Descriptors / Indicators: Guarding;Grimacing;Operative site guarding Pain Intervention(s): Limited activity within patient's tolerance;Monitored during session           PT Goals (current goals can now be found in the care plan section)      Frequency    Min 5X/week      PT Plan Current plan remains appropriate       AM-PAC PT "6 Clicks" Daily Activity  Outcome Measure  Difficulty turning over in bed (including adjusting bedclothes, sheets and blankets)?: Unable Difficulty moving from lying on back to sitting on the side of the bed? : Unable Difficulty sitting down on and standing up from a chair with arms (e.g., wheelchair, bedside commode, etc,.)?: Unable Help needed moving to and from a bed to chair (including a wheelchair)?: A Lot Help needed walking in hospital room?: A Little Help needed climbing 3-5 steps with a railing? : Total 6 Click Score: 9    End of Session   Activity Tolerance: Patient limited by pain Patient left: with call bell/phone within reach;in bed;with bed alarm set(with pillow inbetween legs to maintain posterior hip precaut) Nurse Communication: Mobility status;Precautions PT Visit Diagnosis: Unsteadiness on feet (R26.81);Other abnormalities of gait and mobility (R26.89);History of falling (Z91.81);Muscle weakness (generalized) (M62.81);Difficulty in walking, not elsewhere classified (R26.2);Pain Pain -  Right/Left: Right Pain - part of body: Hip     Time: 1341-1351 PT Time Calculation (min) (ACUTE ONLY): 10 min  Charges:  $Therapeutic Exercise: 8-22 mins                     Rinaldo Cloud, SPT Acute Rehabilitation Services Office [phone]    Rinaldo Cloud 05/05/2018, 2:19 PM

## 2018-05-05 NOTE — Progress Notes (Signed)
Report called to [facility] at [facility].  Patient transported via PTAR.  No c/o from patient and no distress noted.

## 2018-05-05 NOTE — [place] FL2 (Signed)
[place] MEDICAID FL2 LEVEL OF CARE SCREENING TOOL     IDENTIFICATION  Patient Name: Tracey Porter Birthdate: 1930/04/23 Sex: female Admission Date (Current Location): 04/30/2018  [facility] [place] and [place] Number:  Producer, television/film/video and Address:  [facility] [place], [address], [place], [place] [address]      Provider Number: [id number]  Attending Physician Name and Address:  Dimple Nanas, MD  Relative Name and Phone Number:  Victorino Dike (daughter) [phone]    Current Level of Care: Hospital Recommended Level of Care: Skilled Nursing Facility Prior Approval Number:    Date Approved/Denied:   PASRR Number: [id number]  Discharge Plan: SNF    Current Diagnoses: Patient Active Problem List   Diagnosis Date Noted  . Hip fracture (HCC) 04/30/2018  . Chronic back pain 10/10/2017  . Dyslipidemia 10/10/2017  . PAF (paroxysmal atrial fibrillation) (HCC) 07/28/2017  . Orthostatic hypotension 07/28/2017  . History of GI bleed 07/28/2017  . Chronic diastolic heart failure (HCC)   . Multiple fractures of pelvis with unstable disruption of pelvic ring, initial encounter for closed fracture (HCC) 03/20/2017  . Atrial fibrillation with RVR (HCC) 03/20/2017  . Aortic atherosclerosis (HCC) 03/20/2017  . Hypotension due to blood loss   . Chronic anticoagulation   . Closed pelvic fracture (HCC) 03/19/2017  . Leukocytosis 03/19/2017  . Fall at home, initial encounter 03/19/2017  . Paroxysmal SVT (supraventricular tachycardia) (HCC)   . Essential hypertension   . Non-insulin treated type 2 diabetes mellitus (HCC) 03/20/2015  . Hypokalemia 03/19/2015    Orientation RESPIRATION BLADDER Height & Weight     Self, Time, Situation, Place  Normal Incontinent Weight: 134 lb 7.7 oz (61 kg) Height:  5' (152.4 cm)  BEHAVIORAL SYMPTOMS/MOOD NEUROLOGICAL BOWEL NUTRITION STATUS      Continent Diet(see discharge)  AMBULATORY STATUS COMMUNICATION OF NEEDS Skin    Extensive Assist Verbally Surgical wounds(hip closed surgical incision)                       Personal Care Assistance Level of Assistance  Bathing, Feeding, Dressing, Total care Bathing Assistance: Maximum assistance Feeding assistance: Independent Dressing Assistance: Maximum assistance Total Care Assistance: Maximum assistance   Functional Limitations Info  Sight, Hearing, Speech Sight Info: Impaired Hearing Info: Adequate Speech Info: Adequate    SPECIAL CARE FACTORS FREQUENCY  PT (By licensed PT), OT (By licensed OT)     PT Frequency: 5x week OT Frequency: 3x week            Contractures Contractures Info: Not present    Additional Factors Info  Code Status, Allergies Code Status Info: full Allergies Info: Allergies: Penicillins           Current Medications (05/05/2018):  This is the current hospital active medication list Current Facility-Administered Medications  Medication Dose Route Frequency Provider Last Rate Last Dose  . 0.9 %  sodium chloride infusion   Intravenous PRN Haddix, Gillie Manners, MD 10 mL/hr at 04/30/18 2313 250 mL at 04/30/18 2313  . acetaminophen (TYLENOL) tablet 650 mg  650 mg Oral Q6H PRN Haddix, Gillie Manners, MD   650 mg at 05/04/18 1006   Or  . acetaminophen (TYLENOL) suppository 650 mg  650 mg Rectal Q6H PRN Haddix, Gillie Manners, MD      . acidophilus (RISAQUAD) capsule 1 capsule  1 capsule Oral Daily Haddix, Gillie Manners, MD   1 capsule at 05/04/18 1010  . acyclovir (ZOVIRAX) tablet 800 mg  800 mg Oral BID Haddix, Gillie Manners, MD   800 mg at 05/04/18 2115  . amiodarone (PACERONE) tablet 200 mg  200 mg Oral BID Haddix, Gillie Manners, MD   200 mg at 05/04/18 2115  . apixaban (ELIQUIS) tablet 5 mg  5 mg Oral BID Dimple Nanas, MD   5 mg at 05/04/18 2113  . HYDROcodone-acetaminophen (NORCO/VICODIN) 5-325 MG per tablet 1-2 tablet  1-2 tablet Oral Q6H PRN Haddix, Gillie Manners, MD   1 tablet at 05/02/18 2239  . insulin aspart (novoLOG) injection 0-9 Units  0-9  Units Subcutaneous TID WC Haddix, Gillie Manners, MD   2 Units at 05/04/18 [phone]  . lactated ringers infusion   Intravenous Continuous Haddix, Gillie Manners, MD 10 mL/hr at 05/03/18 0752    . metoprolol succinate (TOPROL-XL) 24 hr tablet 12.5 mg  12.5 mg Oral QHS Haddix, Gillie Manners, MD   12.5 mg at 05/04/18 2115  . mirabegron ER (MYRBETRIQ) tablet 25 mg  25 mg Oral Daily Haddix, Gillie Manners, MD   25 mg at 05/04/18 1009  . morphine 2 MG/ML injection 2 mg  2 mg Intravenous Q4H PRN Haddix, Gillie Manners, MD   2 mg at 05/04/18 2053  . ondansetron (ZOFRAN) tablet 4 mg  4 mg Oral Q6H PRN Haddix, Gillie Manners, MD       Or  . ondansetron (ZOFRAN) injection 4 mg  4 mg Intravenous Q6H PRN Haddix, Gillie Manners, MD   4 mg at 05/01/18 0214  . pantoprazole (PROTONIX) EC tablet 40 mg  40 mg Oral Daily Haddix, Gillie Manners, MD   40 mg at 05/04/18 1008  . polyvinyl alcohol (LIQUIFILM TEARS) 1.4 % ophthalmic solution 1 drop  1 drop Both Eyes BID Haddix, Gillie Manners, MD   1 drop at 05/04/18 2118  . prednisoLONE acetate (PRED FORTE) 1 % ophthalmic suspension 1 drop  1 drop Right Eye QID Haddix, Gillie Manners, MD   1 drop at 05/04/18 2118  . timolol (TIMOPTIC) 0.5 % ophthalmic solution 1 drop  1 drop Both Eyes BID Haddix, Gillie Manners, MD   1 drop at 05/04/18 2119  . vitamin B-12 (CYANOCOBALAMIN) tablet 1,000 mcg  1,000 mcg Oral Daily Haddix, Gillie Manners, MD   1,000 mcg at 05/04/18 1008     Discharge Medications: Please see discharge summary for a list of discharge medications.  Relevant Imaging Results:  Relevant Lab Results:   Additional Information SSN: [id number]  Gildardo Griffes, LCSW

## 2018-05-05 NOTE — Progress Notes (Addendum)
Patient will DC to: [facility] Anticipated DC date: 05/05/18 Family notified: Victorino Dike Transport by: Sharin Mons  Per MD patient ready for DC to [facility]. RN, patient, patient's family, and facility notified of DC. Discharge Summary sent to facility. RN given number for report [phone] Room 102P. DC packet on chart. Ambulance transport requested for patient.  CSW signing off.  [place], [place] [phone]

## 2018-05-18 ENCOUNTER — Encounter: Payer: Self-pay | Admitting: Student

## 2018-05-30 ENCOUNTER — Encounter: Payer: Self-pay | Admitting: Cardiovascular Disease

## 2018-05-30 ENCOUNTER — Ambulatory Visit (INDEPENDENT_AMBULATORY_CARE_PROVIDER_SITE_OTHER): Payer: Medicare Other | Admitting: Cardiovascular Disease

## 2018-05-30 VITALS — BP 106/62 | HR 71 | Ht 66.0 in | Wt 148.8 lb

## 2018-05-30 DIAGNOSIS — S72001S Fracture of unspecified part of neck of right femur, sequela: Secondary | ICD-10-CM | POA: Diagnosis not present

## 2018-05-30 DIAGNOSIS — I471 Supraventricular tachycardia: Secondary | ICD-10-CM

## 2018-05-30 DIAGNOSIS — I480 Paroxysmal atrial fibrillation: Secondary | ICD-10-CM

## 2018-05-30 DIAGNOSIS — Z7901 Long term (current) use of anticoagulants: Secondary | ICD-10-CM | POA: Diagnosis not present

## 2018-05-30 MED ORDER — LABETALOL HCL 200 MG PO TABS: ORAL_TABLET | ORAL | 3 refills | Status: DC

## 2018-05-30 MED ORDER — DILTIAZEM HCL ER COATED BEADS 120 MG PO CP24: 120.0000 mg | ORAL_CAPSULE | Freq: Every day | ORAL | 3 refills | Status: DC

## 2018-05-30 NOTE — Patient Instructions (Signed)
Medication Instructions:  STOP metoprolol succinate (Toprol XL) DECREASE labetolol to 200 mg in the morning and 100 mg at bedtime  If you need a refill on your cardiac medications before your next appointment, please call your pharmacy.   Follow-Up: At [facility], you and your health needs are our priority.  As part of our continuing mission to provide you with exceptional heart care, we have created designated Provider Care Teams.  These Care Teams include your primary Cardiologist (physician) and Advanced Practice Providers (APPs -  Physician Assistants and Nurse Practitioners) who all work together to provide you with the care you need, when you need it. You will need a follow up appointment in 3-4 months.  Please call our office 2 months in advance to schedule this appointment.  You may see Dr. Tresa EndoKelly or one of the following Advanced Practice Providers on your designated Care Team: Brain[place]ao Meng, [place] . Micah FlesherAngela Duke, PA-C

## 2018-05-30 NOTE — Progress Notes (Signed)
Cardiology Office Note    Date:  06/05/2018   ID:  Tracey Porter, DOB 09-16-1929, MRN [id number]  PCP:  Reymundo Poll, MD  Cardiologist:  Shelva Majestic, MD     History of Present Illness:  Tracey Porter is a 84 y.o. female who was initially referred for cardiology evaluation following an emergency room visit where she was found to be in SVT.  I last saw her in February 2019.  She presents now for a 9 month follow-up cardiology evaluation.  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  She has a history of hypertension, hyperlipidemia, and type 2 diabetes mellitus.  She had been taking metformin 500 mg daily with breakfast, leg, basal, alt to her milligrams twice a day, HCTZ 12.5 g daily, and atorvastatin 40 mg.  She has had issues with urinary urgency and was put on Mirabegron.  On 04/20/2016.  She also he went to [facility] with initial urinary complaints and was found to have SVT.  She was not seen by cardiologist.  However, Dr. Debara Pickett had spoken to the ER physician and it was recommended the addition of diltiazem LA 120 mg daily.  Patient was asymptomatic with reference to this tachycardia and a subsequent ECG showed restoration of normal sinus rhythm.  When I saw her, I further titrated diltiazem from 120 mg to 180 mg and recommended discontinuance of hydrochlorothiazide.  She underwent a 2-D echo Doppler study on 05/19/2016 which showed an EF of 65-70%.  She had normal wall motion.  There was grade 2 diastolic dysfunction.  There was mitral annular calcification with mild to moderate MR.  There was mild aortic valve stenosis with a peak gradient of 20 and mean gradient of 12 mmHg.  She had severe LA dilatation, moderate TR, and mild/moderate pulmonary hypertension with PA pressure 44 mm.  When  I saw her in December 2017, she was hypertensive and I further titrated Cardizem CD to 240 mg daily.  She has  continued to take labetalol 200 mg twice a day.  Does continue to take atorvastatin 40 mg for hyperlipidemia and is on metformin for type 2 diabetes mellitus.  On 10/19/2016 repeat laboratory revealed a total cholesterol of 191, triglycerides 83, HDL 88, and LDL cholesterol 86.  She was admitted to [facility] in July 2018 with acute onset hypertensive pulmonary edema, which improved with diuresis.  An echo, EF was reduced at 45-50% and there was a mild anteroseptal wall motion abnormality with moderate MR, mild-to-moderate TR, an estimated RV pressure at 48%.  Labetolol was further titrated.  She was referred for [facility] study which was done in August 2018, which was low risk.  An EF was 60% without ischemia.  Of note, the patient was in atrial fibrillation during the study.  Ultimately, she was started on eliquis and developed a GI bleed that sustained a fall leading to pelvic fracture.  She was seen in follow-up by me in October 2018.  Repeat echo Doppler study showed an EF of 65-70%.  She was  seen in November by Almyra Deforest, and she remained volume overloaded.  She was on torsemide and she was also found to have low magnesium level, which was replaced.  Apparently, she had been restarted back on eliquis and as tolerated this well without recurrent GI bleed.  She was last seen in our office on 07/28/2017 by Kerin Ransom and he decreased  amiodarone from 200 mg twice a day to 20 mg daily.  Since she was maintaining sinus rhythm he discontinued diltiazem and at that time added low-dose Toprol 25 mg daily.  She had noted some mild dizziness.  She was unaware of recurrent atrial fibrillation.    Since I last saw her, she had fallen and sustained a right subcapital femoral neck fracture was admitted to the hospital on April 30, 2018.  Her Eliquis was held.  She underwent right hip arthroplasty on October 23 and tolerated the procedure well.  Prior to discharge she was cleared to resume Eliquis.  She  ultimately was transferred to rehab and has been in [place] and rehabilitation.  Presently, she is unaware of recurrent episodes of atrial fibrillation and she admits to being fatigued tired.  Review of her occasions suggest that there may be some confusion in her medications.  It appears that she is on amiodarone 200 mg daily and had been on diltiazem 360 mg but actually it appears she has been taking 120 mg daily.  It appears she is also on metoprolol 12.5 mg but also is taking labetalol 300 mg in the morning and 100 mg at night.  She continues to take Eliquis 5 mg twice a day.  She admits to fatigue.  She denies dizziness.  View of recent blood pressure recordings at [facility] and rehabilitation suggest her blood pressure has been low.  She presents for evaluation.  Past Medical History:  Diagnosis Date  . Diabetes mellitus without complication ([place])   . Hypertension     Past Surgical History:  Procedure Laterality Date  . ABDOMINAL HYSTERECTOMY  1976  . EYE SURGERY    . HIP ARTHROPLASTY Right 05/03/2018   Procedure: ARTHROPLASTY BIPOLAR HIP (HEMIARTHROPLASTY);  Surgeon: Shona Needles, MD;  Location: [place];  Service: Orthopedics;  Laterality: Right;    Current Medications: Outpatient Medications Prior to Visit  Medication Sig Dispense Refill  . acetaminophen (TYLENOL) 500 MG tablet Take 1,000 mg by mouth every 8 (eight) hours as needed for moderate pain.    [name] acyclovir (ZOVIRAX) 800 MG tablet Take 800 mg by mouth 2 (two) times daily.    [name] amiodarone (PACERONE) 200 MG tablet Take 200 mg by mouth daily.    [name] apixaban (ELIQUIS) 5 MG TABS tablet Take 1 tablet (5 mg total) by mouth 2 (two) times daily. Please cancel previous rx's sent; call pt with price before filling 180 tablet 3  . Capsaicin (ASPERCREME PAIN RELIEF PATCH EX) Apply 2 patches topically daily.    . Cholecalciferol (D-3-5) 5000 units capsule Take 5,000 Units by mouth daily.    . ferrous sulfate 325 (65 FE) MG tablet  Take 325 mg by mouth daily with breakfast.    . loperamide (IMODIUM) 2 MG capsule Take 1 capsule (2 mg total) by mouth as needed for diarrhea or loose stools. 30 capsule 0  . magnesium chloride (SLOW-MAG) 64 MG TBEC SR tablet Take 1 tablet by mouth 2 (two) times daily.    . Multiple Vitamins-Minerals (ICAPS PLUS) TABS Take 1 tablet by mouth 2 (two) times daily.    [name] MYRBETRIQ 25 MG TB24 tablet Take 25 mg by mouth daily.    . nitroGLYCERIN (NITROSTAT) 0.4 MG SL tablet Place 0.4 mg under the tongue every 5 (five) minutes as needed for chest pain.    . pantoprazole (PROTONIX) 40 MG tablet Take 40 mg by mouth daily.    Vladimir Faster Glycol-Propyl Glycol (SYSTANE)  0.4-0.3 % SOLN Apply 1 drop to eye 2 (two) times daily.    . polyethylene glycol (MIRALAX / GLYCOLAX) packet Take 17 g by mouth daily. 14 each 0  . prednisoLONE acetate (PRED FORTE) 1 % ophthalmic suspension Place 1 drop into the right eye 4 (four) times daily.     . Probiotic Product (PROBIOTIC PO) Take 1 capsule by mouth daily.    [name] senna-docusate (SENOKOT-S) 8.6-50 MG tablet Take 2 tablets by mouth daily as needed for mild constipation. 30 tablet 0  . timolol (BETIMOL) 0.5 % ophthalmic solution Place 1 drop into both eyes 2 (two) times daily.    . traMADol (ULTRAM) 50 MG tablet Take 25 mg by mouth every 4 (four) hours as needed for moderate pain.    . vitamin B-12 (CYANOCOBALAMIN) 1000 MCG tablet Take 1,000 mcg by mouth daily.    . vitamin C (ASCORBIC ACID) 500 MG tablet Take 500 mg by mouth 2 (two) times daily.    [name] amiodarone (PACERONE) 200 MG tablet Take 1 tablet (200 mg total) by mouth daily. (Patient taking differently: Take 200 mg by mouth 2 (two) times daily. ) 90 tablet 3  . diltiazem (CARDIZEM CD) 360 MG 24 hr capsule TAKE 1 CAPSULE BY MOUTH  DAILY 90 capsule 3  . labetalol (NORMODYNE) 200 MG tablet TAKE 1.5 TABLETS BY MOUTH  EVERY MORNING AND TAKE 1  TABLET BY MOUTH BY MOUTH  EVERY EVENING. 225 tablet 3  . metoprolol succinate  (TOPROL XL) 25 MG 24 hr tablet Take 0.5 tablets (12.5 mg total) by mouth at bedtime. 30 tablet 6  . cephALEXin (KEFLEX) 500 MG capsule Take 1 capsule (500 mg total) by mouth 3 (three) times daily. 40 capsule 0  . GLUCOSAMINE-CHONDROIT-VIT C-MN PO Take 1 tablet by mouth daily.    [name] ibuprofen (ADVIL,MOTRIN) 600 MG tablet Take 1 tablet (600 mg total) by mouth every 6 (six) hours as needed. (Patient taking differently: Take 600 mg by mouth every 6 (six) hours as needed for moderate pain. ) 30 tablet 0  . Teriparatide, Recombinant, (FORTEO Shorter) Inject 20 mcg daily into the skin.     No facility-administered medications prior to visit.      Allergies:   Penicillins   Social History   Socioeconomic History  . Marital status: Widowed    Spouse name: Not on file  . Number of children: Not on file  . Years of education: Not on file  . Highest education level: Not on file  Occupational History  . Not on file  Social Needs  . Financial resource strain: Not on file  . Food insecurity:    Worry: Not on file    Inability: Not on file  . Transportation needs:    Medical: Not on file    Non-medical: Not on file  Tobacco Use  . Smoking status: Former Research scientist (life sciences)  . Smokeless tobacco: Never Used  Substance and Sexual Activity  . Alcohol use: Yes  . Drug use: No  . Sexual activity: Not on file  Lifestyle  . Physical activity:    Days per week: Not on file    Minutes per session: Not on file  . Stress: Not on file  Relationships  . Social connections:    Talks on phone: Not on file    Gets together: Not on file    Attends religious service: Not on file    Active member of club or organization: Not on file    Attends  meetings of clubs or organizations: Not on file    Relationship status: Not on file  Other Topics Concern  . Not on file  Social History Narrative  . Not on file    Additional social history is notable that she is from [facility] in [place].  She is widowed for 19 years.   She is 4 children and 6 grandchildren.  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  She had remotely smoked cigarettes for 10 years but quit in 1970.  She usually has one drink per day of either wine or scotch.   Family History:  Her family history is notable in that her mother died at age 31 with liver problems and father died with lung cancer at 23.  She has one sister in her 27s.  Maternal grand mother died with a stroke.  Maternal grandfather died with typhoid fever.  She had 4 children, but unfortunately one died at age 16 in an accident.  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; No changes in hearing, sinus congestion, difficulty swallowing Pulmonary: Negative; No cough, wheezing, shortness of breath, hemoptysis Cardiovascular: See history of present illness GI: Positive for GI bleed GU: Positive for urinary frequency Musculoskeletal: Recent right hip fracture Hematologic/Oncology: Negative; no easy bruising, bleeding Endocrine: Positive for diabetes mellitus Neuro: Negative; no changes in balance, headaches Skin: Negative; No rashes or skin lesions Psychiatric: Negative; No behavioral problems, depression Sleep: Negative; No snoring, daytime sleepiness, hypersomnolence, bruxism, restless legs, hypnogognic hallucinations, no cataplexy Other comprehensive 14 point system review is negative.   PHYSICAL EXAM:   VS:  BP 106/62   Pulse 71   Ht '5\' 6"'  (1.676 m)   Wt 148 lb 12.8 oz (67.5 kg)   BMI 24.02 kg/m     Repeat blood pressure by me 102/60  Wt Readings from Last 3 Encounters:  05/30/18 148 lb 12.8 oz (67.5 kg)  05/03/18 134 lb 7.7 oz (61 kg)  02/22/18 150 lb (68 kg)    General: Alert, oriented, no distress.  Skin: normal turgor, no rashes, warm and dry HEENT: Normocephalic, atraumatic. Pupils equal round and reactive to light;  sclera anicteric; extraocular muscles intact;  Nose without nasal septal hypertrophy Mouth/Parynx benign; Mallinpatti scale 3 Neck: No JVD, no carotid bruits; normal carotid upstroke Lungs: clear to ausculatation and percussion; no wheezing or rales Chest wall: without tenderness to palpitation Heart: PMI not displaced, RRR, s1 s2 normal, 1/6 systolic murmur, no diastolic murmur, no rubs, gallops, thrills, or heaves Abdomen: soft, nontender; no hepatosplenomehaly, BS+; abdominal aorta nontender and not dilated by palpation. Back: no CVA tenderness Pulses 2+ Musculoskeletal: full range of motion, normal strength, no joint deformities Extremities: no clubbing cyanosis or edema, Homan's sign negative  Neurologic: grossly nonfocal; Cranial nerves grossly wnl Psychologic: Normal mood and affect    Studies/Labs Reviewed:   ECG (independently read by me): Normal sinus rhythm at 71 bpm.  First-degree AV block with PR interval 222 ms.  QTc interval 454 ms.  February 2019 ECG (independently read by me): Normal sinus rhythm at 70 bpm.  QTc interval 453 ms.  PR interval 184 ms.  Q wave in lead 3.  No ST segment changes.  ECG (independently read by me): Sinus rhythm at 87 bpm.  Normal intervals.  No significant ST changes.  06/25/2016 ECG (independently read by me): Normal sinus rhythm  at 92 bpm.  PR interval 190 ms.  QTc interval 427 ms.  04/30/2016 ECG (independently read by me): Normal sinus rhythm at 75 bpm.  I personally reviewed the 2 ECGs from her emergency evaluation on 04/20/2016.   Initial ECG showed SVT at 148 with a PR interval at 107 ms and QTc interval at 426 ms.  Subsequent ECG showed sinus tachycardia 109 bpm with a PR interval at 187 ms and QTc interval 419 ms.  Recent Labs: BMP Latest Ref Rng & Units 05/05/2018 05/04/2018 05/03/2018  Glucose 70 - 99 mg/dL 118(H) 136(H) 120(H)  BUN 8 - 23 mg/dL '13 14 18  ' Creatinine 0.44 - 1.00 mg/dL 0.63 0.64 0.71  BUN/Creat Ratio 12 - 28 -  - -  Sodium 135 - 145 mmol/L 135 134(L) 133(L)  Potassium 3.5 - 5.1 mmol/L 3.8 3.5 4.0  Chloride 98 - 111 mmol/L 98 97(L) 99  CO2 22 - 32 mmol/L '27 27 27  ' Calcium 8.9 - 10.3 mg/dL 8.6(L) 8.5(L) 8.6(L)     Hepatic Function Latest Ref Rng & Units 05/01/2018 08/16/2017 05/12/2017  Total Protein 6.5 - 8.1 g/dL 6.4(L) 6.2 6.6  Albumin 3.5 - 5.0 g/dL 3.2(L) 3.4(L) 3.7  AST 15 - 41 U/L 21 52(H) 23  ALT 0 - 44 U/L 21 72(H) 19  Alk Phosphatase 38 - 126 U/L 65 164(H) 134(H)  Total Bilirubin 0.3 - 1.2 mg/dL 0.6 0.5 0.7  Bilirubin, Direct 0.1 - 0.5 mg/dL - - -    CBC Latest Ref Rng & Units 05/04/2018 05/04/2018 05/02/2018  WBC 4.0 - 10.5 K/uL 14.0(H) 14.4(H) 12.2(H)  Hemoglobin 12.0 - 15.0 g/dL 9.6(L) 10.5(L) 11.1(L)  Hematocrit 36.0 - 46.0 % 29.7(L) 33.1(L) 33.8(L)  Platelets 150 - 400 K/uL 346 288 243   Lab Results  Component Value Date   MCV 96.7 05/04/2018   MCV 98.5 05/04/2018   MCV 97.4 05/02/2018   Lab Results  Component Value Date   TSH 3.180 08/16/2017   Lab Results  Component Value Date   HGBA1C 6.2 (H) 03/20/2017     BNP    Component Value Date/Time   BNP 373.3 (H) 03/24/2017 1148    ProBNP No results found for: PROBNP   Lipid Panel  No results found for: CHOL, TRIG, HDL, CHOLHDL, VLDL, LDLCALC, LDLDIRECT   RADIOLOGY: No results found.   Additional studies/ records that were reviewed today include:  I personally reviewed her emergency room records from 04/20/2016, and repeat laboratory.    ASSESSMENT:    1. PAF (paroxysmal atrial fibrillation) ([place])   2. Chronic anticoagulation   3. Closed fracture of right hip, sequela   4. Paroxysmal SVT (supraventricular tachycardia) [facility])      PLAN:  Tracey Porter is an  84 year old female who has a history of hypertension, hyperlipidemia, and type 2 diabetes mellitus.  She went to the emergency room with urinary complaints and was found to have SVT for which she was completely asymptomatic.  During her SVT the  PR interval was short.  A subsequent ECG with restoration of sinus rhythm showed PR interval at 187.  She was started on diltiazem CD 120 mg with subsequent dose titration.  She  developed paroxysmal atrial fibrillation for which she was started on eliquis anticoagulation and developed initial GI blood loss and was found to have esophagitis on EGD.  She also sustained pelvic fractures following a fall.  Following ultimate stabilization after admission to [facility].  medications were decrease in amiodarone was  added for rate control.  I last saw her, she had been on amiodarone 200 mg daily, Toprol-XL 25 mg and had sustained a fracture at L2.  When I last saw her if dural injection was necessary I advised holding Eliquis for 4 days prior to the procedure.  Apparently since I last saw her she was hospitalized after sustaining a hip fracture.  She underwent successful right hip arthroplasty on May 03, 2018.  There has been some confusion in her medications.  ECG today confirms she is maintaining sinus rhythm.  Presently, I am recommending that he continue amiodarone 200 mg daily as well as diltiazem CD 120 mg every morning.  I am recommending that she discontinue metoprolol succinate.  I am reducing labetalol such that she will take 200 mg in the morning and 100 mg at night.  I have recommended that her blood pressure and heart rate be monitored closely.  Further medication adjustments may be necessary.  He is tolerating apixaban without bleeding.  He continues to take pantoprazole for GERD.  I will see her in 3 to 4 months for follow-up evaluation or sooner if problems arise.   Medication Adjustments/Labs and Tests Ordered: Current medicines are reviewed at length with the patient today.  Concerns regarding medicines are outlined above.  Medication changes, Labs and Tests ordered today are listed in the Patient Instructions below.  Patient Instructions  Medication Instructions:  STOP metoprolol  succinate (Toprol XL) DECREASE labetolol to 200 mg in the morning and 100 mg at bedtime  If you need a refill on your cardiac medications before your next appointment, please call your pharmacy.   Follow-Up: At [facility], you and your health needs are our priority.  As part of our continuing mission to provide you with exceptional heart care, we have created designated Provider Care Teams.  These Care Teams include your primary Cardiologist (physician) and Advanced Practice Providers (APPs -  Physician Assistants and Nurse Practitioners) who all work together to provide you with the care you need, when you need it. You will need a follow up appointment in 3-4 months.  Please call our office 2 months in advance to schedule this appointment.  You may see Dr. Claiborne Billings or one of the following Advanced Practice Providers on your designated Care Team: [place], [place] . Fabian Sharp, PA-C       Signed, Shelva Majestic, MD  06/05/2018 6:53 PM    [facility] [address] [place], [place], [place]  [address] Phone: [phone]

## 2018-06-05 ENCOUNTER — Encounter: Payer: Self-pay | Admitting: Cardiovascular Disease

## 2018-06-22 NOTE — Addendum Note (Signed)
Addended by: Lorain ChildesPRESSLEY, Aryana Wonnacott A on: 06/22/2018 07:30 AM   Modules accepted: Orders

## 2018-08-01 ENCOUNTER — Other Ambulatory Visit: Payer: Self-pay | Admitting: Student

## 2018-08-01 DIAGNOSIS — M810 Age-related osteoporosis without current pathological fracture: Secondary | ICD-10-CM

## 2018-08-02 ENCOUNTER — Other Ambulatory Visit: Payer: Medicare Other

## 2018-09-21 ENCOUNTER — Other Ambulatory Visit: Payer: Medicare Other

## 2018-10-02 ENCOUNTER — Ambulatory Visit: Payer: Medicare Other | Admitting: Cardiovascular Disease

## 2018-10-19 ENCOUNTER — Other Ambulatory Visit: Payer: Medicare Other

## 2018-12-21 ENCOUNTER — Other Ambulatory Visit: Payer: Self-pay | Admitting: Student

## 2018-12-21 ENCOUNTER — Other Ambulatory Visit: Payer: Medicare Other

## 2018-12-21 DIAGNOSIS — Z13820 Encounter for screening for osteoporosis: Secondary | ICD-10-CM

## 2018-12-26 ENCOUNTER — Encounter: Payer: Self-pay | Admitting: Cardiovascular Disease

## 2018-12-26 ENCOUNTER — Other Ambulatory Visit: Payer: Self-pay | Admitting: Student

## 2018-12-26 ENCOUNTER — Telehealth (INDEPENDENT_AMBULATORY_CARE_PROVIDER_SITE_OTHER): Payer: Medicare Other | Admitting: Cardiovascular Disease

## 2018-12-26 VITALS — BP 130/80 | HR 80 | Temp 97.1°F | Resp 20 | Ht 66.0 in | Wt 157.8 lb

## 2018-12-26 DIAGNOSIS — Z7901 Long term (current) use of anticoagulants: Secondary | ICD-10-CM

## 2018-12-26 DIAGNOSIS — I10 Essential (primary) hypertension: Secondary | ICD-10-CM | POA: Diagnosis not present

## 2018-12-26 DIAGNOSIS — I471 Supraventricular tachycardia: Secondary | ICD-10-CM | POA: Diagnosis not present

## 2018-12-26 DIAGNOSIS — K2100 Gastro-esophageal reflux disease with esophagitis, without bleeding: Secondary | ICD-10-CM

## 2018-12-26 DIAGNOSIS — E2839 Other primary ovarian failure: Secondary | ICD-10-CM

## 2018-12-26 DIAGNOSIS — I480 Paroxysmal atrial fibrillation: Secondary | ICD-10-CM

## 2018-12-26 NOTE — Progress Notes (Signed)
Virtual Visit via Telephone Note   This visit type was conducted due to national recommendations for restrictions regarding the COVID-19 Pandemic (e.g. social distancing) in an effort to limit this patient's exposure and mitigate transmission in our community.  Due to her co-morbid illnesses, this patient is at least at moderate risk for complications without adequate follow up.  This format is felt to be most appropriate for this patient at this time.  The patient did not have access to video technology/had technical difficulties with video requiring transitioning to audio format only (telephone).  All issues noted in this document were discussed and addressed.  No physical exam could be performed with this format.  Please refer to the patient's chart for her  consent to telehealth for [facility].   Date:  12/26/2018   ID:  Tracey Porter, DOB 11/24/1929, MRN [id number]  Patient Location: Skilled Nursing Facility Provider Location: Office  PCP:  [name], Henry, MD  Cardiologist:  No primary care provider on file.  Electrophysiologist:  None   Evaluation Performed:  Follow-Up Visit  Chief Complaint:  7 month F/U SVT  History of Present Illness:    Tracey PingConstance Dino is a 85 y.o. 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  She has a history of hypertension, hyperlipidemia, and type 2 diabetes mellitus.  She had been taking metformin 500 mg daily with breakfast, leg, basal, alt to her milligrams twice a day, HCTZ 12.5 g daily, and atorvastatin 40 mg.  She has had issues with urinary urgency and was put on Mirabegron.  On 04/20/2016.  She also he went to [facility] with initial urinary complaints and was found to have SVT.  She was not seen by cardiologist.  However, Dr. Rennis GoldenHilty had spoken to the ER physician and it was recommended the addition of diltiazem LA 120 mg daily.  Patient was asymptomatic with reference to this tachycardia  and a subsequent ECG showed restoration of normal sinus rhythm.  When I saw her, I further titrated diltiazem from 120 mg to 180 mg and recommended discontinuance of hydrochlorothiazide.  She underwent a 2-D echo Doppler study on 05/19/2016 which showed an EF of 65-70%.  She had normal wall motion.  There was grade 2 diastolic dysfunction.  There was mitral annular calcification with mild to moderate MR.  There was mild aortic valve stenosis with a peak gradient of 20 and mean gradient of 12 mmHg.  She had severe LA dilatation, moderate TR, and mild/moderate pulmonary hypertension with PA pressure 44 mm.  When  I saw her in December 2017, she was hypertensive and I further titrated Cardizem CD to 240 mg daily.  She has continued to take labetalol 200 mg twice a day.  Does continue to take atorvastatin 40 mg for hyperlipidemia and is on metformin for type 2 diabetes mellitus.  On 10/19/2016 repeat laboratory revealed a total cholesterol of 191, triglycerides 83, HDL 88, and LDL cholesterol 86.  She was [facility] July 2018 with acute onset hypertensive pulmonary edema, which improved with diuresis.  An echo EF was reduced at 45-50% and there was a mild anteroseptal wall motion abnormality with moderate MR, mild-to-moderate TR, an estimated RV pressure at 48%.  Labetolol was further titrated.  [facility] which was done in August 2018, which was low risk.  An EF was 60% without ischemia.  Of note, the patient was in  atrial fibrillation during the study.  Ultimately, she was started on eliquis and developed a GI bleed that sustained a fall leading to pelvic fracture.  She was seen in follow-up by me in October 2018.  Repeat echo Doppler study showed an EF of 65-70%.  She was  seen in November by Azalee Course, and she remained volume overloaded.  She was on torsemide and she was also found to have low magnesium level, which was replaced.  Apparently, she had been  restarted back on eliquis and as tolerated this well without recurrent GI bleed.  She was last seen in our office on 07/28/2017 by Corine Shelter and he decreased amiodarone from 200 mg twice a day to 20 mg daily.  Since she was maintaining sinus rhythm he discontinued diltiazem and at that time added low-dose Toprol 25 mg daily.  She had noted some mild dizziness.  She was unaware of recurrent atrial fibrillation.    She fell and sustained a right subcapital femoral neck fracture was admitted to the hospital on April 30, 2018.  Her Eliquis was held.  She underwent right hip arthroplasty on October 23 and tolerated the procedure well.  Prior to discharge she was cleared to resume Eliquis.  She ultimately was transferred to rehab and has been in [facility].  I saw her in November 2019 at which time she was doing well without recurrent episodes of atrial fibrillation but she admitted to being significantly fatigued.  Her medications were reviewed and it appeared that she was on  amiodarone 200 mg daily and had been on diltiazem 360 mg but was just taking 120 mg daily.  It appears that she was on  metoprolol 12.5 mg but also was taking labetalol 300 mg in the morning and 100 mg at night.  She continues to take Eliquis 5 mg twice a day.  She admits to fatigue.  She denies dizziness.  View of recent blood pressure recordings at [facility] and rehabilitation suggest her blood pressure has been low.  During my evaluation I recommended that she continue to take amiodarone 200 mg daily as well as diltiazem 120 mg in the morning.  I discontinued metoprolol and reduced labetalol to 200 mg in the morning and 100 mg at night.  Over the last several months she has continued to do well.  She has now been at [facility] assisted living.  She tells me she will soon hopefully be going to independent living at [facility].  She denies chest pain or shortness of breath.  Her rhythm has been stable  without recurrent atrial fibrillation.  She denies any swelling.  Her weight has been stable.  Her fatigability improved but she typically takes a short nap in the afternoon.  Prior to the COVID-19 pandemic[facility]  She presents for evaluation.  The patient does not have symptoms concerning for COVID-19 infection (fever, chills, cough, or new shortness of breath).    Past Medical History:  Diagnosis Date   Diabetes mellitus without complication (HCC)    Hypertension    Past Surgical History:  Procedure Laterality Date   ABDOMINAL HYSTERECTOMY  1976   EYE SURGERY     HIP ARTHROPLASTY Right 05/03/2018   Procedure: ARTHROPLASTY BIPOLAR HIP (HEMIARTHROPLASTY);  Surgeon: Roby Lofts, MD;  Location: [facility] OR;  Service: Orthopedics;  Laterality: Right;     Current Meds  Medication Sig   acetaminophen (  TYLENOL) 500 MG tablet Take 1,000 mg by mouth every 8 (eight) hours as needed for moderate pain.   acyclovir (ZOVIRAX) 800 MG tablet Take 800 mg by mouth 2 (two) times daily.   amiodarone (PACERONE) 200 MG tablet Take 200 mg by mouth daily.   apixaban (ELIQUIS) 5 MG TABS tablet Take 1 tablet (5 mg total) by mouth 2 (two) times daily. Please cancel previous rx's sent; call pt with price before filling   Capsaicin (ASPERCREME PAIN RELIEF PATCH EX) Apply 2 patches topically daily.   Cholecalciferol (D-3-5) 5000 units capsule Take 5,000 Units by mouth daily.   diltiazem (CARDIZEM CD) 120 MG 24 hr capsule Take 1 capsule (120 mg total) by mouth daily.   ferrous sulfate 325 (65 FE) MG tablet Take 325 mg by mouth daily with breakfast.   labetalol (NORMODYNE) 200 MG tablet Take 1 tablet (200 mg total) by mouth every morning AND 0.5 tablets (100 mg total) at bedtime.   loperamide (IMODIUM) 2 MG capsule Take 1 capsule (2 mg total) by mouth as needed for diarrhea or loose stools.   magnesium chloride (SLOW-MAG) 64 MG TBEC SR tablet Take  1 tablet by mouth 2 (two) times daily.   Multiple Vitamins-Minerals (ICAPS PLUS) TABS Take 1 tablet by mouth 2 (two) times daily.   MYRBETRIQ 25 MG TB24 tablet Take 25 mg by mouth daily.   nitroGLYCERIN (NITROSTAT) 0.4 MG SL tablet Place 0.4 mg under the tongue every 5 (five) minutes as needed for chest pain.   pantoprazole (PROTONIX) 40 MG tablet Take 40 mg by mouth daily.   Polyethyl Glycol-Propyl Glycol (SYSTANE) 0.4-0.3 % SOLN Apply 1 drop to eye 2 (two) times daily.   polyethylene glycol (MIRALAX / GLYCOLAX) packet Take 17 g by mouth daily.   prednisoLONE acetate (PRED FORTE) 1 % ophthalmic suspension Place 1 drop into the right eye 4 (four) times daily.    Probiotic Product (PROBIOTIC PO) Take 1 capsule by mouth daily.   senna-docusate (SENOKOT-S) 8.6-50 MG tablet Take 2 tablets by mouth daily as needed for mild constipation.   timolol (BETIMOL) 0.5 % ophthalmic solution Place 1 drop into both eyes 2 (two) times daily.   traMADol (ULTRAM) 50 MG tablet Take 25 mg by mouth every 4 (four) hours as needed for moderate pain.   vitamin B-12 (CYANOCOBALAMIN) 1000 MCG tablet Take 1,000 mcg by mouth daily.   vitamin C (ASCORBIC ACID) 500 MG tablet Take 500 mg by mouth 2 (two) times daily.     Allergies:   Penicillins   Social History   Tobacco Use   Smoking status: Former Smoker   Smokeless tobacco: Never Used  Substance Use Topics   Alcohol use: Yes   Drug use: No    Additional social history is notable that [place].  She is widowed for 19 years.  She is 4 children and 6 grandchildren.  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  She had remotely smoked cigarettes for 10 years but quit in 1970.  She usually has one drink per day of either wine or scotch.    Family Hx: The patient's family history includes Lung cancer in her father; Lymphoma in her daughter.  Her mother died at age 83 with liver problems and father died with lung  cancer at 3063.  She has one sister in her 3380s.  Maternal grand mother died with a stroke.  Maternal grandfather died with typhoid fever.  She had  4 children, but unfortunately one died at age 28 in an accident.  ROS:   Please see the history of present illness.    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; No changes in hearing, sinus congestion, difficulty swallowing Pulmonary: Negative; No cough, wheezing, shortness of breath, hemoptysis Cardiovascular: See history of present illness GI: Positive for GI bleed GU: Positive for urinary frequency Musculoskeletal: Recent right hip fracture Hematologic/Oncology: Negative; no easy bruising, bleeding Endocrine: Positive for diabetes mellitus Neuro: Negative; no changes in balance, headaches Skin: Negative; No rashes or skin lesions Psychiatric: Negative; No behavioral problems, depression Sleep: Negative; No snoring, daytime sleepiness, hypersomnolence, bruxism, restless legs, hypnogognic hallucinations, no cataplexy Other comprehensive 14 point system review is negative. All other systems reviewed and are negative.    Labs/Other Tests and Data Reviewed:    EKG:  An ECG dated 05/30/2018 was personally reviewed today and demonstrated:  Normal sinus rhythm at 71 bpm.  First-degree AV block with PR interval 222 ms.  QTc interval 454 ms.  Recent Labs: 05/01/2018: ALT 21 05/04/2018: Hemoglobin 9.6; Platelets 346 05/05/2018: BUN 13; Creatinine, Ser 0.63; Magnesium 1.7; Potassium 3.8; Sodium 135   Recent Lipid Panel No results found for: CHOL, TRIG, HDL, CHOLHDL, LDLCALC, LDLDIRECT  Wt Readings from Last 3 Encounters:  05/30/18 148 lb 12.8 oz (67.5 kg)  05/03/18 134 lb 7.7 oz (61 kg)  02/22/18 150 lb (68 kg)     Objective:    Vital Signs:  BP 130/80    Pulse 80    Temp (!) 97.1 F (36.2 C)    Resp 20     Since this was a phone visit I could not visually inspect the patient She was weighed by the assistant and her weight today was 157.8 pounds Her breathing was normal and unlabored. There was no wheezing Her pulse was regular There was no chest wall tenderness or abdominal tenderness to palpation There was no edema There were no neurologic issues She had normal cognition, mood and affect    ASSESSMENT & PLAN:    1. Paroxysmal atrial fibrillation: She has not had any recurrence on amiodarone 200 mg, labetalol, and diltiazem CD 120 at bedtime. She is anticoagulated on Eliquis 2. Essential hypertension: Blood pressure today is stable 3. Anticoagulation: Currently tolerating Eliquis.  When she was initially started on a Eliquis she developed initial GI blood loss and was found to have esophagitis on ECG 4. Right hip fracture status post right hip arthroplasty May 03, 2018.  Completed a course of rehabilitation, now currently at [facility] assisted living and according to the patient she is hopeful to move to independent living at [facility].  5. GERD: Stable on pantoprazole  COVID-19 Education: The signs and symptoms of COVID-19 were discussed with the patient and how to seek care for testing (follow up with PCP or arrange E-visit).  The importance of social distancing was discussed today.  Time:   Today, I have spent 25 minutes with the patient and assistant with telehealth technology discussing the above problems.     Medication Adjustments/Labs and Tests Ordered: Current medicines are reviewed at length with the patient today.  Concerns regarding medicines are outlined above.   Tests Ordered: No orders of the defined types were placed in this encounter.   Medication Changes: No orders of the defined types were placed in this encounter.  Follow Up:  6 months  Signed, Nicki Guadalajarahomas Marchella Hibbard, MD  12/26/2018 2:32 PM    [facility]

## 2018-12-26 NOTE — Patient Instructions (Signed)

## 2019-06-22 ENCOUNTER — Encounter (HOSPITAL_COMMUNITY): Payer: Self-pay | Admitting: Emergency Medicine

## 2019-06-22 ENCOUNTER — Emergency Department (HOSPITAL_COMMUNITY): Payer: Medicare Other

## 2019-06-22 ENCOUNTER — Inpatient Hospital Stay (HOSPITAL_COMMUNITY)
Admission: EM | Admit: 2019-06-22 | Discharge: 2019-06-29 | DRG: 177 | Disposition: A | Discharge status: Home / Self Care | Payer: Medicare Other | Source: Skilled Nursing Facility | Attending: Internal Medicine | Admitting: Internal Medicine

## 2019-06-22 DIAGNOSIS — N281 Cyst of kidney, acquired: Secondary | ICD-10-CM

## 2019-06-22 DIAGNOSIS — Z880 Allergy status to penicillin: Secondary | ICD-10-CM

## 2019-06-22 DIAGNOSIS — Z807 Family history of other malignant neoplasms of lymphoid, hematopoietic and related tissues: Secondary | ICD-10-CM

## 2019-06-22 DIAGNOSIS — E785 Hyperlipidemia, unspecified: Secondary | ICD-10-CM | POA: Diagnosis present

## 2019-06-22 DIAGNOSIS — E119 Type 2 diabetes mellitus without complications: Secondary | ICD-10-CM

## 2019-06-22 DIAGNOSIS — U071 COVID-19: Secondary | ICD-10-CM | POA: Diagnosis not present

## 2019-06-22 DIAGNOSIS — I10 Essential (primary) hypertension: Secondary | ICD-10-CM | POA: Diagnosis present

## 2019-06-22 DIAGNOSIS — J069 Acute upper respiratory infection, unspecified: Secondary | ICD-10-CM | POA: Diagnosis present

## 2019-06-22 DIAGNOSIS — Z7901 Long term (current) use of anticoagulants: Secondary | ICD-10-CM

## 2019-06-22 DIAGNOSIS — Z96641 Presence of right artificial hip joint: Secondary | ICD-10-CM | POA: Diagnosis present

## 2019-06-22 DIAGNOSIS — E1165 Type 2 diabetes mellitus with hyperglycemia: Secondary | ICD-10-CM | POA: Diagnosis not present

## 2019-06-22 DIAGNOSIS — I110 Hypertensive heart disease with heart failure: Secondary | ICD-10-CM | POA: Diagnosis present

## 2019-06-22 DIAGNOSIS — J9601 Acute respiratory failure with hypoxia: Secondary | ICD-10-CM | POA: Diagnosis present

## 2019-06-22 DIAGNOSIS — I5032 Chronic diastolic (congestive) heart failure: Secondary | ICD-10-CM | POA: Diagnosis present

## 2019-06-22 DIAGNOSIS — Z801 Family history of malignant neoplasm of trachea, bronchus and lung: Secondary | ICD-10-CM

## 2019-06-22 DIAGNOSIS — R0602 Shortness of breath: Secondary | ICD-10-CM | POA: Diagnosis not present

## 2019-06-22 DIAGNOSIS — I480 Paroxysmal atrial fibrillation: Secondary | ICD-10-CM | POA: Diagnosis present

## 2019-06-22 DIAGNOSIS — I700 Atherosclerosis of aorta: Secondary | ICD-10-CM | POA: Diagnosis present

## 2019-06-22 IMAGING — DX DG CHEST 1V PORT
1 series · 1 of 1 positions shown · non-contrast
Comparison: 03/26/2017

CLINICAL DATA: [id number] positive, hypoxia

EXAM:
PORTABLE CHEST 1 VIEW

[chest]
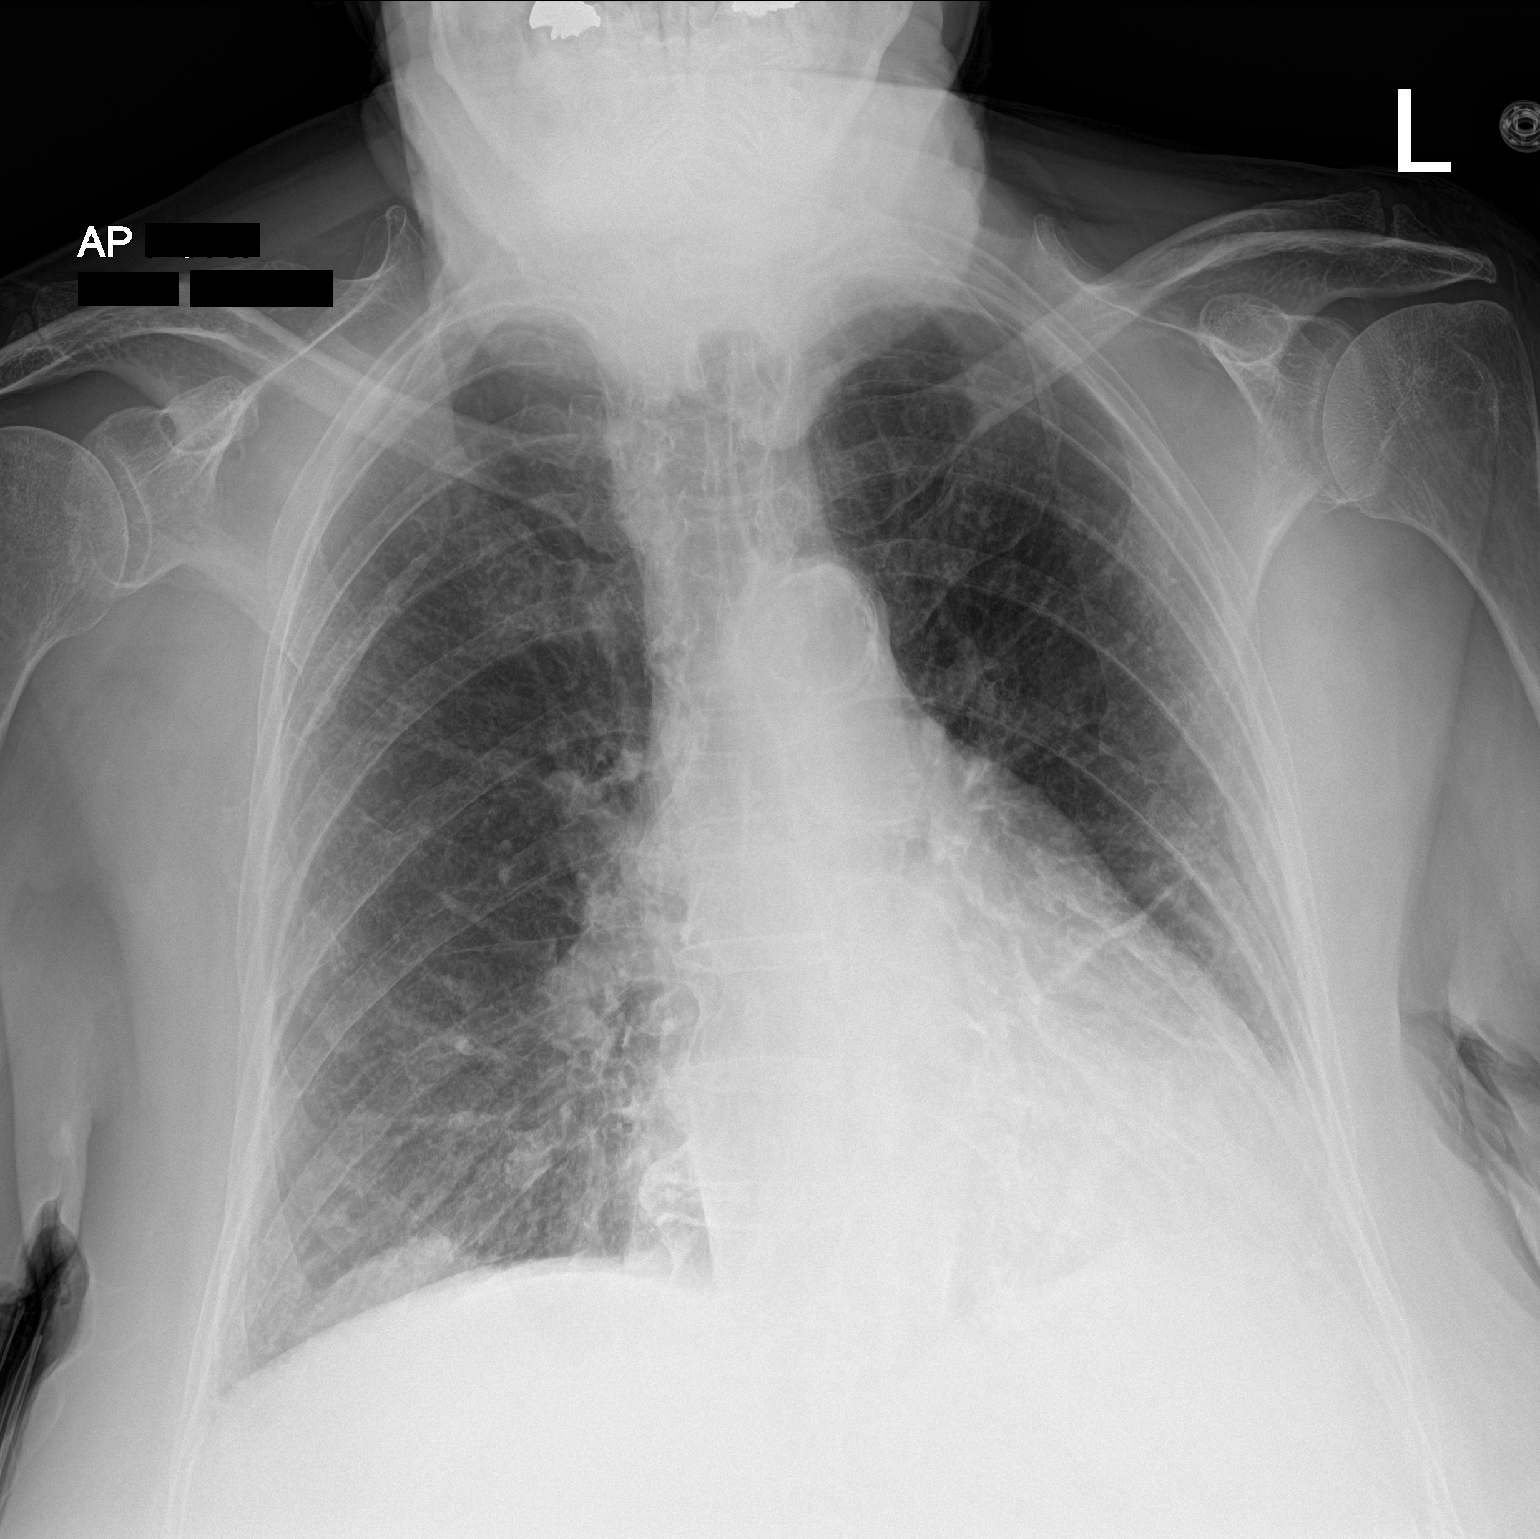

[1 of 1 positions shown; findings below may reference images not displayed]

FINDINGS: Cardiomegaly. Calcified aortic knob. There is a well-defined ovoid
density at the right lung base which may reflect focal eventration
of the right hemidiaphragm. Coarsened interstitial markings
bilaterally. No pleural effusion or pneumothorax.
IMPRESSION: Well-defined ovoid density at the right lung base may reflect focal
eventration of the right hemidiaphragm. Repeat PA and lateral chest
radiographs could be confirmed to further evaluate this finding.

## 2019-06-22 NOTE — ED Triage Notes (Signed)
Pt arrives to ED via [facility] from [facility]- per ems the patient tested positive for covid today and when they went to check her o2 it was in the 80's. However pt denies any sob or cp just states over the last 1 week she has felt tired. Pt states she only got tested as part of routine testing where she lives. Pts room air sat on arrival is 95%. Pt is alert and ox4, has no complaints

## 2019-06-22 NOTE — ED Notes (Signed)
Patient daughter Anderson Malta [phone] asking for a call back, would like an update on patient

## 2019-06-22 NOTE — ED Provider Notes (Signed)
[facility] Provider Note   CSN: [id number] Arrival date & time: 06/22/19  1809     History Chief Complaint  Patient presents with  . covid +    Tracey PingConstance Guedes is a 85 y.o. female.  HPI Patient presents to the emergency department with Covid positive and low pulse ox at her facility.  They felt that her pulse oximetry was in the 80s.  Patient does not have any complaints for me.  Patient states that she was tested as part of the the routine at her facility she is not had any other issues.  Patient states she is not had any shortness of breath or chest pain.  Patient states she is had some cough but no other issues of upper respiratory type symptoms.  The patient denies chest pain, shortness of breath, headache,blurred vision, neck pain, fever,weakness, numbness, dizziness, anorexia, edema, abdominal pain, nausea, vomiting, diarrhea, rash, back pain, dysuria, hematemesis, bloody stool, near syncope, or syncope.    Past Medical History:  Diagnosis Date  . Diabetes mellitus without complication (HCC)   . Hypertension     Patient Active Problem List   Diagnosis Date Noted  . Displaced fracture of right femoral neck (HCC) 04/30/2018  . Chronic back pain 10/10/2017  . Dyslipidemia 10/10/2017  . PAF (paroxysmal atrial fibrillation) (HCC) 07/28/2017  . Orthostatic hypotension 07/28/2017  . History of GI bleed 07/28/2017  . Chronic diastolic heart failure (HCC)   . Multiple fractures of pelvis with unstable disruption of pelvic ring, initial encounter for closed fracture (HCC) 03/20/2017  . Atrial fibrillation with RVR (HCC) 03/20/2017  . Aortic atherosclerosis (HCC) 03/20/2017  . Hypotension due to blood loss   . Chronic anticoagulation   . Closed pelvic fracture (HCC) 03/19/2017  . Leukocytosis 03/19/2017  . Fall at home, initial encounter 03/19/2017  . Paroxysmal SVT (supraventricular tachycardia) (HCC)   . Essential hypertension   .  Non-insulin treated type 2 diabetes mellitus (HCC) 03/20/2015  . Hypokalemia 03/19/2015    Past Surgical History:  Procedure Laterality Date  . ABDOMINAL HYSTERECTOMY  1976  . EYE SURGERY    . HIP ARTHROPLASTY Right 05/03/2018   Procedure: ARTHROPLASTY BIPOLAR HIP (HEMIARTHROPLASTY);  Surgeon: Roby LoftsHaddix, Kevin P, MD;  Location: [facility] OR;  Service: Orthopedics;  Laterality: Right;     OB History   No obstetric history on file.     Family History  Problem Relation Age of Onset  . Lung cancer Father   . Lymphoma Daughter     Social History   Tobacco Use  . Smoking status: Former Games developermoker  . Smokeless tobacco: Never Used  Substance Use Topics  . Alcohol use: Yes  . Drug use: No    Home Medications Prior to Admission medications   Medication Sig Start Date End Date Taking? Authorizing Provider  acetaminophen (TYLENOL) 500 MG tablet Take 1,000 mg by mouth every 8 (eight) hours as needed for moderate pain.    [provider]  acyclovir (ZOVIRAX) 800 MG tablet Take 800 mg by mouth 2 (two) times daily.    [provider]  amiodarone (PACERONE) 200 MG tablet Take 200 mg by mouth daily.    [provider]  apixaban (ELIQUIS) 5 MG TABS tablet Take 1 tablet (5 mg total) by mouth 2 (two) times daily. Please cancel previous rx's sent; call pt with price before filling 02/24/17   Azalee CourseMeng, Hao, PA  Capsaicin (ASPERCREME PAIN RELIEF PATCH EX) Apply 2 patches topically daily.  [provider]  Cholecalciferol (D-3-5) 5000 units capsule Take 5,000 Units by mouth daily.    [provider]  diltiazem (CARDIZEM CD) 120 MG 24 hr capsule Take 1 capsule (120 mg total) by mouth daily. 05/30/18   Lennette Bihari, MD  ferrous sulfate 325 (65 FE) MG tablet Take 325 mg by mouth daily with breakfast.    [provider]  labetalol (NORMODYNE) 200 MG tablet Take 1 tablet (200 mg total) by mouth every morning AND 0.5 tablets (100 mg total) at bedtime. 05/30/18    Lennette Bihari, MD  loperamide (IMODIUM) 2 MG capsule Take 1 capsule (2 mg total) by mouth as needed for diarrhea or loose stools. 03/31/17   Tyrone Nine, MD  magnesium chloride (SLOW-MAG) 64 MG TBEC SR tablet Take 1 tablet by mouth 2 (two) times daily.    [provider]  Multiple Vitamins-Minerals (ICAPS PLUS) TABS Take 1 tablet by mouth 2 (two) times daily.    [provider]  MYRBETRIQ 25 MG TB24 tablet Take 25 mg by mouth daily. 03/04/17   [provider]  nitroGLYCERIN (NITROSTAT) 0.4 MG SL tablet Place 0.4 mg under the tongue every 5 (five) minutes as needed for chest pain.    [provider]  pantoprazole (PROTONIX) 40 MG tablet Take 40 mg by mouth daily.    [provider]  Polyethyl Glycol-Propyl Glycol (SYSTANE) 0.4-0.3 % SOLN Apply 1 drop to eye 2 (two) times daily.    [provider]  polyethylene glycol (MIRALAX / GLYCOLAX) packet Take 17 g by mouth daily. 05/04/18   Amin, Loura Halt, MD  prednisoLONE acetate (PRED FORTE) 1 % ophthalmic suspension Place 1 drop into the right eye 4 (four) times daily.  03/19/17   [provider]  Probiotic Product (PROBIOTIC PO) Take 1 capsule by mouth daily.    [provider]  timolol (BETIMOL) 0.5 % ophthalmic solution Place 1 drop into both eyes 2 (two) times daily.    [provider]  traMADol (ULTRAM) 50 MG tablet Take 25 mg by mouth every 4 (four) hours as needed for moderate pain.    [provider]  vitamin B-12 (CYANOCOBALAMIN) 1000 MCG tablet Take 1,000 mcg by mouth daily.    [provider]  vitamin C (ASCORBIC ACID) 500 MG tablet Take 500 mg by mouth 2 (two) times daily.    [provider]    Allergies    Penicillins  Review of Systems   Review of Systems All other systems negative except as documented in the HPI. All pertinent positives and negatives as reviewed in the HPI. Physical Exam Updated Vital Signs BP (!) 145/88    Pulse 81   Temp 98.9 F (37.2 C) (Oral)   Resp 16   Ht  (1.676 m)   Wt 71.6 kg   SpO2 92%   BMI 25.48 kg/m   Physical Exam Vitals and nursing note reviewed.  Constitutional:      General: She is not in acute distress.    Appearance: She is well-developed.  HENT:     Head: Normocephalic and atraumatic.  Eyes:     Pupils: Pupils are equal, round, and reactive to light.  Cardiovascular:     Rate and Rhythm: Normal rate and regular rhythm.     Heart sounds: Normal heart sounds. No murmur. No friction rub. No gallop.   Pulmonary:     Effort: Pulmonary effort is normal. No respiratory distress.  Breath sounds: Normal breath sounds. No wheezing.  Abdominal:     General: Bowel sounds are normal. There is no distension.     Palpations: Abdomen is soft.     Tenderness: There is no abdominal tenderness.  Musculoskeletal:     Cervical back: Normal range of motion and neck supple.  Skin:    General: Skin is warm and dry.     Capillary Refill: Capillary refill takes less than 2 seconds.     Findings: No erythema or rash.  Neurological:     Mental Status: She is alert and oriented to person, place, and time.     Motor: No abnormal muscle tone.     Coordination: Coordination normal.  Psychiatric:        Behavior: Behavior normal.     ED Results / Procedures / Treatments   Labs (all labs ordered are listed, but only abnormal results are displayed) Labs Reviewed  SARS CORONAVIRUS 2 (TAT 6-24 HRS)  BASIC METABOLIC PANEL  CBC WITH DIFFERENTIAL/PLATELET  C-REACTIVE PROTEIN  FERRITIN  POC SARS CORONAVIRUS 2 AG -  ED    EKG None  Radiology DG Chest Port 1 View  Result Date: 06/22/2019 CLINICAL DATA:  COVID-19 positive, hypoxia EXAM: PORTABLE CHEST 1 VIEW COMPARISON:  03/26/2017 FINDINGS: Cardiomegaly. Calcified aortic knob. There is a well-defined ovoid density at the right lung base which may reflect focal eventration of the right hemidiaphragm. Coarsened interstitial  markings bilaterally. No pleural effusion or pneumothorax. IMPRESSION: Well-defined ovoid density at the right lung base may reflect focal eventration of the right hemidiaphragm. Repeat PA and lateral chest radiographs could be confirmed to further evaluate this finding. Electronically Signed   By: Davina Poke M.D.   On: 06/22/2019 20:17    Procedures Procedures (including critical care time)  Medications Ordered in ED Medications - No data to display  ED Course  I have reviewed the triage vital signs and the nursing notes.  Pertinent labs & imaging results that were available during my care of the patient were reviewed by me and considered in my medical decision making (see chart for details).    MDM Rules/Calculators/A&P     CHA2DS2/VAS Stroke Risk Points  Current as of 26 minutes ago     7 >= 2 Points: High Risk  1 - 1.99 Points: Medium Risk  0 Points: Low Risk    This is the only CHA2DS2/VAS Stroke Risk Points available for the past  year.: Last Change: N/A     Details    This score determines the patient's risk of having a stroke if the  patient has atrial fibrillation.       Points Metrics  1 Has Congestive Heart Failure:  Yes    Current as of 26 minutes ago  1 Has Vascular Disease:  Yes    Current as of 26 minutes ago  1 Has Hypertension:  Yes    Current as of 26 minutes ago  2 Age:  39    Current as of 26 minutes ago  1 Has Diabetes:  Yes    Current as of 26 minutes ago  0 Had Stroke:  No  Had TIA:  No  Had thromboembolism:  No    Current as of 26 minutes ago  1 Female:  Yes    Current as of 26 minutes ago                         I  spoke with the admitting doctors and the laboratory testing had not returned at that point yet.  There is some issue obtaining and I thought they were already back when I spoke with him.  They request to call back once her laboratory testing has been completed. Final Clinical Impression(s) / ED Diagnoses Final diagnoses:    COVID-19    Rx / DC Orders ED Discharge Orders    None       Charlestine Night, PA-C 06/22/19 2332    Tegeler, Canary Brim, MD 06/22/19 2350

## 2019-06-22 NOTE — ED Notes (Signed)
Ambulated pt in room with SpO2 and walker, ambulated maintained 95-99% throughout ambulation, b ut when pt sat down and stopped moving, SATs went down to 88-89%, initially, currently SATs of 96% in room after sitting for a few mins.

## 2019-06-23 ENCOUNTER — Encounter (HOSPITAL_COMMUNITY): Payer: Self-pay | Admitting: Internal Medicine

## 2019-06-23 ENCOUNTER — Inpatient Hospital Stay (HOSPITAL_COMMUNITY): Payer: Medicare Other

## 2019-06-23 DIAGNOSIS — R0602 Shortness of breath: Secondary | ICD-10-CM | POA: Diagnosis present

## 2019-06-23 DIAGNOSIS — Z7901 Long term (current) use of anticoagulants: Secondary | ICD-10-CM | POA: Diagnosis not present

## 2019-06-23 DIAGNOSIS — J9601 Acute respiratory failure with hypoxia: Secondary | ICD-10-CM | POA: Diagnosis present

## 2019-06-23 DIAGNOSIS — Z807 Family history of other malignant neoplasms of lymphoid, hematopoietic and related tissues: Secondary | ICD-10-CM | POA: Diagnosis not present

## 2019-06-23 DIAGNOSIS — Z801 Family history of malignant neoplasm of trachea, bronchus and lung: Secondary | ICD-10-CM | POA: Diagnosis not present

## 2019-06-23 DIAGNOSIS — Z96641 Presence of right artificial hip joint: Secondary | ICD-10-CM | POA: Diagnosis present

## 2019-06-23 DIAGNOSIS — J069 Acute upper respiratory infection, unspecified: Secondary | ICD-10-CM | POA: Diagnosis present

## 2019-06-23 DIAGNOSIS — U071 COVID-19: Secondary | ICD-10-CM | POA: Diagnosis present

## 2019-06-23 DIAGNOSIS — I110 Hypertensive heart disease with heart failure: Secondary | ICD-10-CM | POA: Diagnosis present

## 2019-06-23 DIAGNOSIS — I480 Paroxysmal atrial fibrillation: Secondary | ICD-10-CM | POA: Diagnosis present

## 2019-06-23 DIAGNOSIS — E1165 Type 2 diabetes mellitus with hyperglycemia: Secondary | ICD-10-CM | POA: Diagnosis not present

## 2019-06-23 DIAGNOSIS — I5032 Chronic diastolic (congestive) heart failure: Secondary | ICD-10-CM | POA: Diagnosis present

## 2019-06-23 DIAGNOSIS — E785 Hyperlipidemia, unspecified: Secondary | ICD-10-CM | POA: Diagnosis present

## 2019-06-23 DIAGNOSIS — Z880 Allergy status to penicillin: Secondary | ICD-10-CM | POA: Diagnosis not present

## 2019-06-23 DIAGNOSIS — I10 Essential (primary) hypertension: Secondary | ICD-10-CM | POA: Diagnosis not present

## 2019-06-23 DIAGNOSIS — I700 Atherosclerosis of aorta: Secondary | ICD-10-CM | POA: Diagnosis present

## 2019-06-23 LAB — CBC WITH DIFFERENTIAL/PLATELET
Abs Immature Granulocytes: 0.05 10*3/uL (ref 0.00–0.07)
Basophils Absolute: 0 10*3/uL (ref 0.0–0.1)
Basophils Relative: 0 %
Eosinophils Absolute: 0.1 10*3/uL (ref 0.0–0.5)
Eosinophils Relative: 2 %
HCT: 36.9 % (ref 36.0–46.0)
Hemoglobin: 12.3 g/dL (ref 12.0–15.0)
Immature Granulocytes: 1 %
Lymphocytes Relative: 13 %
Lymphs Abs: 0.9 10*3/uL (ref 0.7–4.0)
MCH: 33.2 pg (ref 26.0–34.0)
MCHC: 33.3 g/dL (ref 30.0–36.0)
MCV: 99.7 fL (ref 80.0–100.0)
Monocytes Absolute: 0.8 10*3/uL (ref 0.1–1.0)
Monocytes Relative: 12 %
Neutro Abs: 4.8 10*3/uL (ref 1.7–7.7)
Neutrophils Relative %: 72 %
Platelets: 302 10*3/uL (ref 150–400)
RBC: 3.7 MIL/uL — ABNORMAL LOW (ref 3.87–5.11)
RDW: 13.1 % (ref 11.5–15.5)
WBC: 6.7 10*3/uL (ref 4.0–10.5)
nRBC: 0 % (ref 0.0–0.2)

## 2019-06-23 LAB — COMPREHENSIVE METABOLIC PANEL
ALT: 67 U/L — ABNORMAL HIGH (ref 0–44)
AST: 44 U/L — ABNORMAL HIGH (ref 15–41)
Albumin: 3.3 g/dL — ABNORMAL LOW (ref 3.5–5.0)
Alkaline Phosphatase: 74 U/L (ref 38–126)
Anion gap: 11 (ref 5–15)
BUN: 11 mg/dL (ref 8–23)
CO2: 27 mmol/L (ref 22–32)
Calcium: 9.1 mg/dL (ref 8.9–10.3)
Chloride: 97 mmol/L — ABNORMAL LOW (ref 98–111)
Creatinine, Ser: 0.89 mg/dL (ref 0.44–1.00)
GFR calc Af Amer: >60 mL/min (ref >60)
GFR calc non Af Amer: 58 mL/min — ABNORMAL LOW (ref >60)
Glucose, Bld: 96 mg/dL (ref 70–99)
Potassium: 3.6 mmol/L (ref 3.5–5.1)
Sodium: 135 mmol/L (ref 135–145)
Total Bilirubin: 0.7 mg/dL (ref 0.3–1.2)
Total Protein: 7.2 g/dL (ref 6.5–8.1)

## 2019-06-23 LAB — HEPATIC FUNCTION PANEL
ALT: 65 U/L — ABNORMAL HIGH (ref 0–44)
AST: 42 U/L — ABNORMAL HIGH (ref 15–41)
Albumin: 3.3 g/dL — ABNORMAL LOW (ref 3.5–5.0)
Alkaline Phosphatase: 74 U/L (ref 38–126)
Bilirubin, Direct: 0.2 mg/dL (ref 0.0–0.2)
Indirect Bilirubin: 0.5 mg/dL (ref 0.3–0.9)
Total Bilirubin: 0.7 mg/dL (ref 0.3–1.2)
Total Protein: 7.2 g/dL (ref 6.5–8.1)

## 2019-06-23 LAB — CBG MONITORING, ED
Glucose-Capillary: 150 mg/dL — ABNORMAL HIGH (ref 70–99)
Glucose-Capillary: 138 mg/dL — ABNORMAL HIGH (ref 70–99)

## 2019-06-23 LAB — BASIC METABOLIC PANEL
Anion gap: 13 (ref 5–15)
BUN: 14 mg/dL (ref 8–23)
CO2: 24 mmol/L (ref 22–32)
Calcium: 8.9 mg/dL (ref 8.9–10.3)
Chloride: 97 mmol/L — ABNORMAL LOW (ref 98–111)
Creatinine, Ser: 0.66 mg/dL (ref 0.44–1.00)
GFR calc Af Amer: >60 mL/min (ref >60)
GFR calc non Af Amer: >60 mL/min (ref >60)
Glucose, Bld: 100 mg/dL — ABNORMAL HIGH (ref 70–99)
Potassium: 3.6 mmol/L (ref 3.5–5.1)
Sodium: 134 mmol/L — ABNORMAL LOW (ref 135–145)

## 2019-06-23 LAB — POC SARS CORONAVIRUS 2 AG -  ED: SARS Coronavirus 2 Ag: POSITIVE — AB

## 2019-06-23 LAB — FERRITIN: Ferritin: 311 ng/mL — ABNORMAL HIGH (ref 11–307)

## 2019-06-23 LAB — C-REACTIVE PROTEIN: CRP: 6.5 mg/dL — ABNORMAL HIGH (ref <1.0)

## 2019-06-23 IMAGING — CT CT RENAL STONE PROTOCOL
2 of 4 series · 17 of 46 positions shown, 19 images · non-contrast
Comparison: March 22, 2017.

CLINICAL DATA: Flank pain.

EXAM:
CT ABDOMEN AND PELVIS WITHOUT CONTRAST
TECHNIQUE: Multidetector CT imaging of the abdomen and pelvis was performed
following the standard protocol without IV contrast.

[Series 3: stone study 5.0 i30f 2 (person_name) · axial · 0.92mm/px · z∈[+573,+938]mm · 14 of 81 slices shown, 16 images]
[im 4/81  soft-tissue]
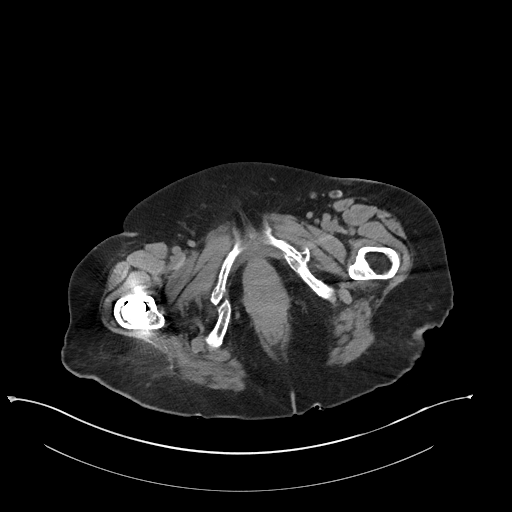
[im 4/81  bone]
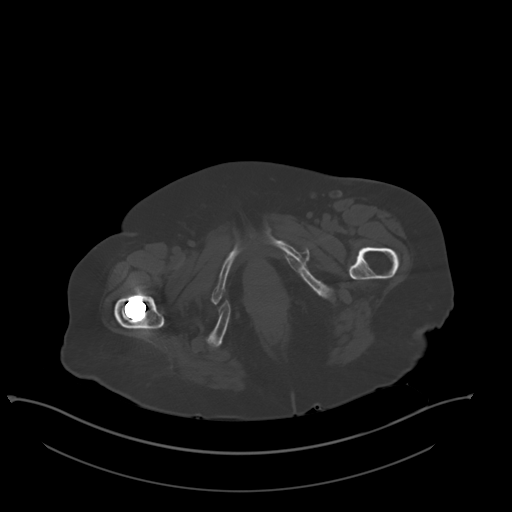
[im 11/81  soft-tissue]
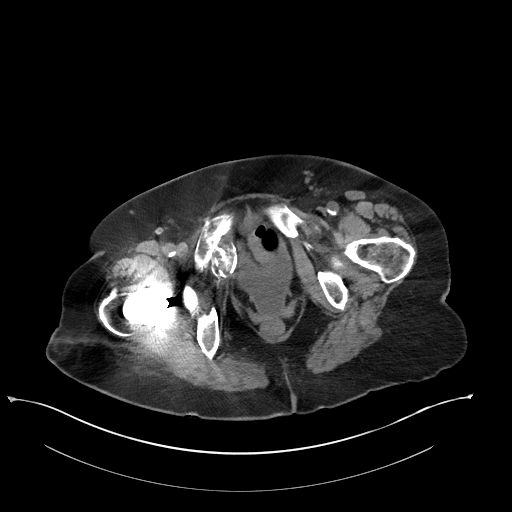
[im 17/81  soft-tissue]
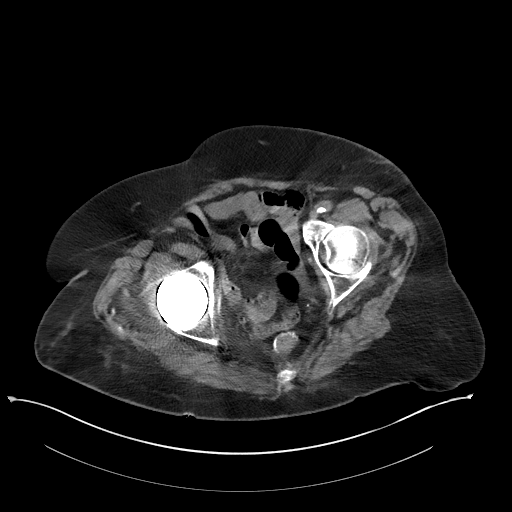
[im 21/81  soft-tissue]
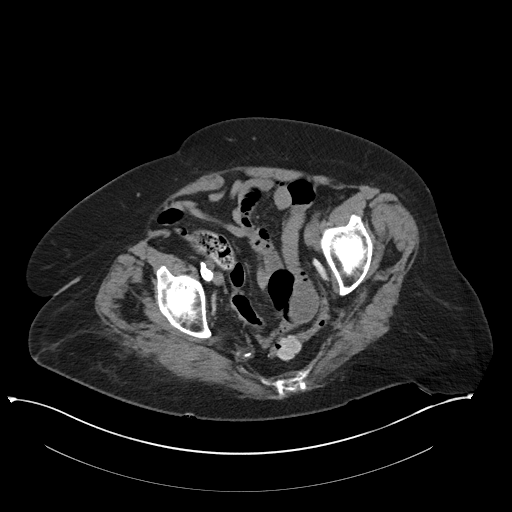
[im 27/81  soft-tissue]
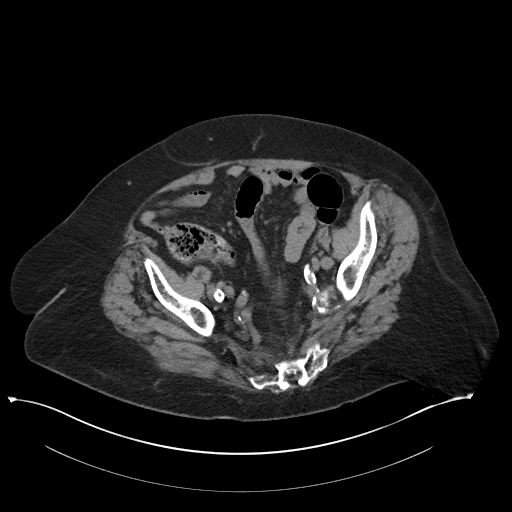
[im 34/81  soft-tissue]
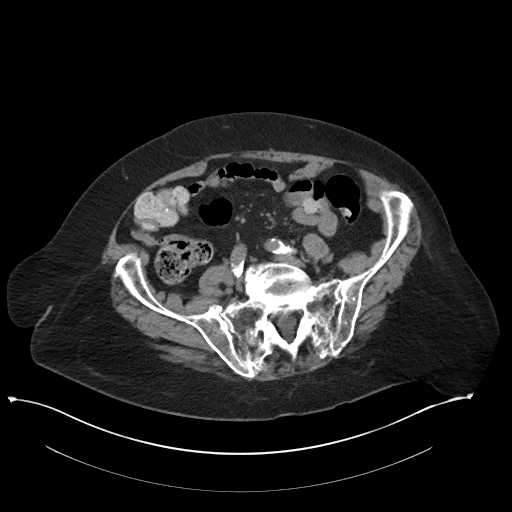
[im 37/81  soft-tissue]
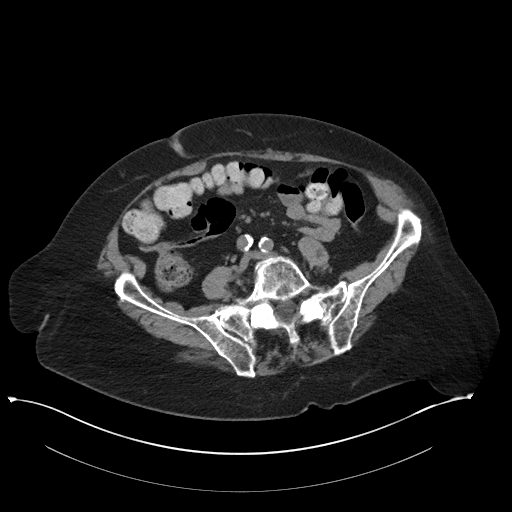
[im 44/81  soft-tissue]
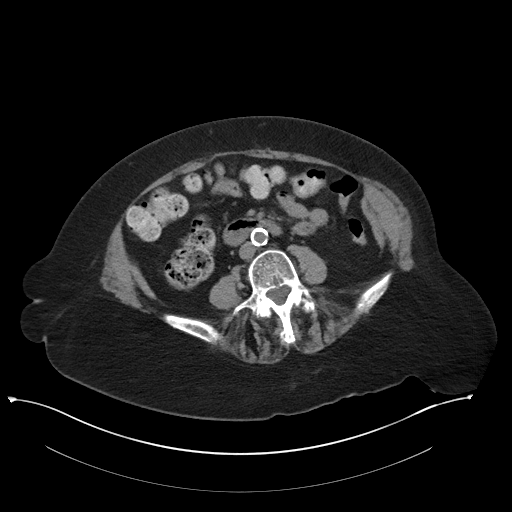
[im 47/81  soft-tissue]
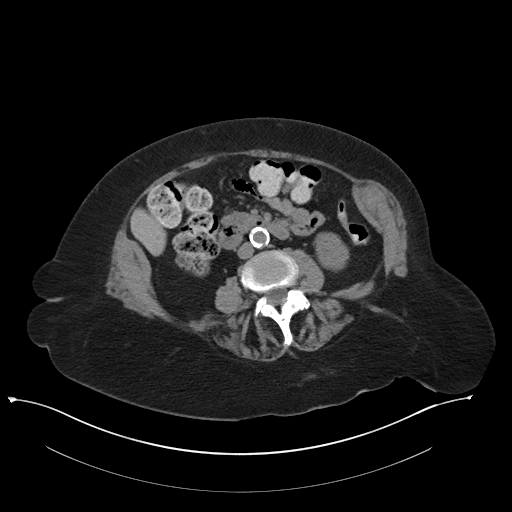
[im 47/81  bone]
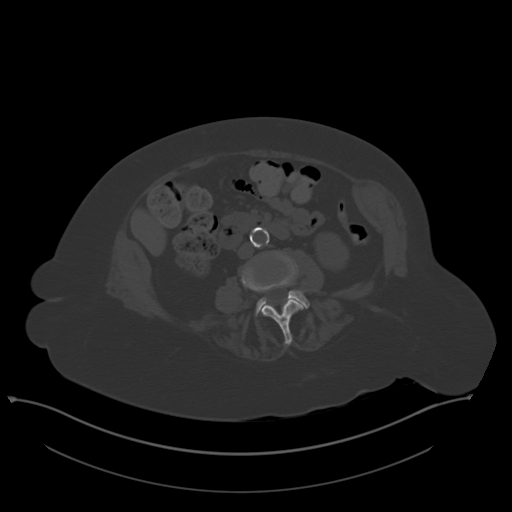
[im 54/81  soft-tissue]
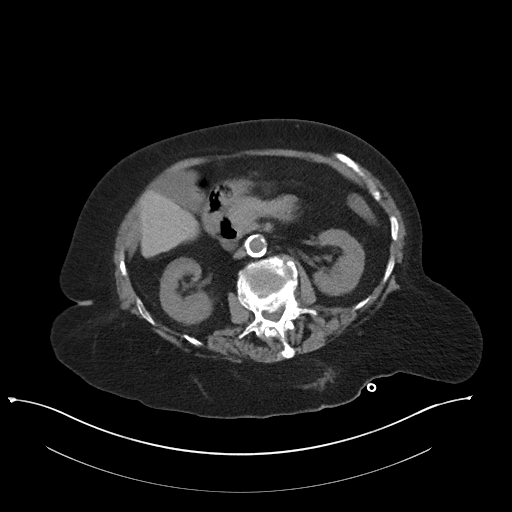
[im 61/81  soft-tissue]
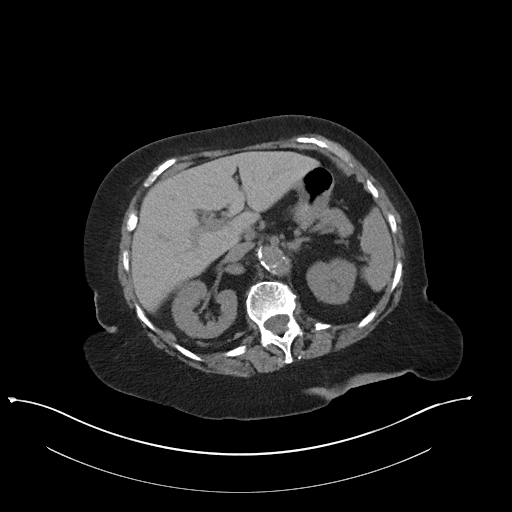
[im 64/81  soft-tissue]
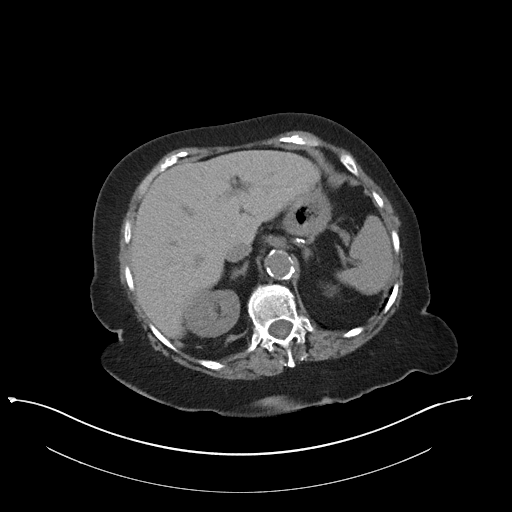
[im 71/81  soft-tissue]
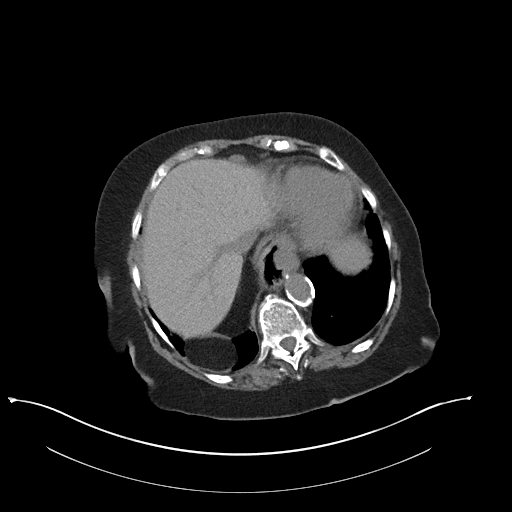
[im 77/81  soft-tissue]
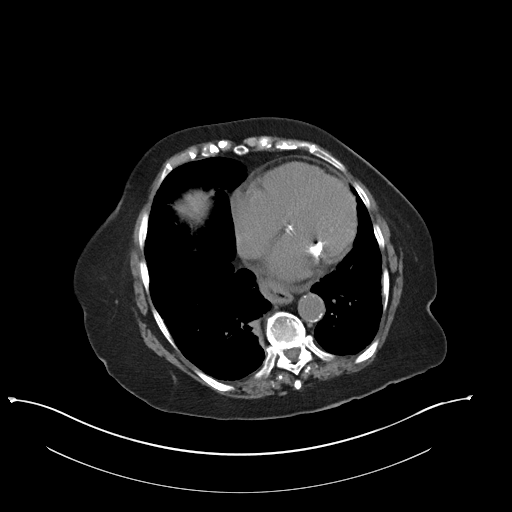

[Series 6: coronal soft tissue · coronal · 0.79mm/px · 3 of 101 slices shown]
[im 34/101  soft-tissue]
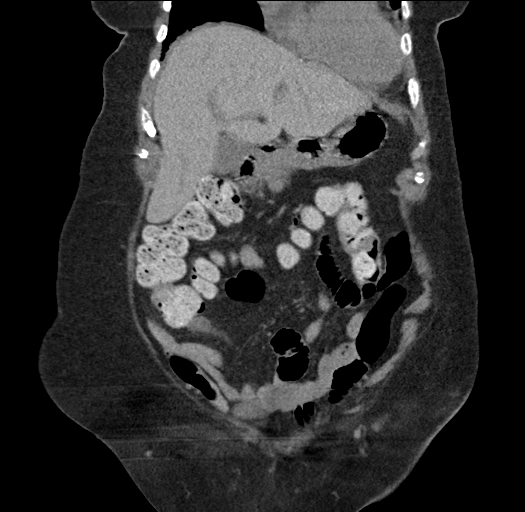
[im 45/101  soft-tissue]
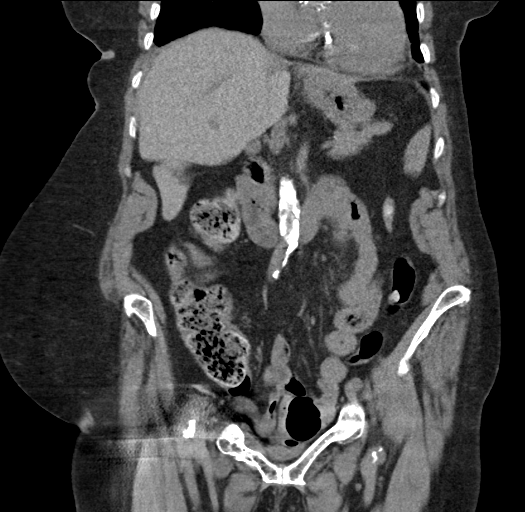
[im 56/101  soft-tissue]
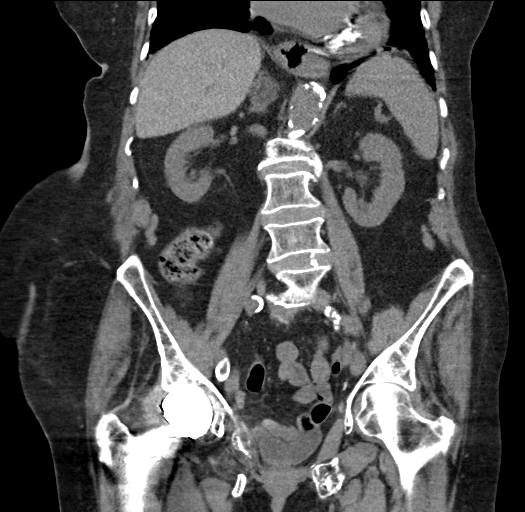

[17 of 46 positions shown; findings below may reference images not displayed]

FINDINGS: Lower chest: No acute abnormality.

Hepatobiliary: No focal liver abnormality is seen. No gallstones,
gallbladder wall thickening, or biliary dilatation.

Pancreas: Unremarkable. No pancreatic ductal dilatation or
surrounding inflammatory changes.

Spleen: Normal in size without focal abnormality.

Adrenals/Urinary Tract: Stable probable right adrenal adenoma. Left
adrenal gland appears normal. No hydronephrosis or renal obstruction
is noted. No hydronephrosis or renal obstruction is noted. No renal
or ureteral calculi are noted. Urinary bladder is decompressed.
cm rounded low density is noted in midpole cortex of right kidney
most consistent with cyst, but ultrasound is recommended for
confirmation and to rule out mass.

Stomach/Bowel: Small sliding-type hiatal hernia is noted. There is
no evidence of bowel obstruction or inflammation. The appendix is
not visualized.

Vascular/Lymphatic: Aortic atherosclerosis. No enlarged abdominal or
pelvic lymph nodes.

Reproductive: Status post hysterectomy. No adnexal masses.

Other: No abdominal wall hernia or abnormality. No abdominopelvic
ascites.

Musculoskeletal: No acute or significant osseous findings.
IMPRESSION: 1. Aortic atherosclerosis.
2. Small sliding-type hiatal hernia.
3. Stable probable right adrenal adenoma.
4. 1.8 cm rounded low density is noted in midpole cortex of right
kidney most consistent with cyst, but ultrasound is recommended for
confirmation and to rule [DATE]. No hydronephrosis or renal obstruction is noted. No renal or
ureteral calculi are noted.

Aortic Atherosclerosis ([id number].S).

## 2019-06-23 MED ORDER — PANTOPRAZOLE SODIUM 40 MG PO TBEC
40.0000 mg | DELAYED_RELEASE_TABLET | Freq: Every day | ORAL | Status: DC
  Administered 2019-06-23 – 2019-06-29 (×7): 40 mg via ORAL
  Filled 2019-06-23 (×7): qty 1

## 2019-06-23 MED ORDER — SENNOSIDES-DOCUSATE SODIUM 8.6-50 MG PO TABS: 1.0000 | ORAL_TABLET | Freq: Every day | ORAL | Status: DC | PRN

## 2019-06-23 MED ORDER — POLYVINYL ALCOHOL 1.4 % OP SOLN
1.0000 [drp] | Freq: Two times a day (BID) | OPHTHALMIC | Status: DC
  Administered 2019-06-23 – 2019-06-29 (×12): 1 [drp] via OPHTHALMIC
  Filled 2019-06-23 (×2): qty 15

## 2019-06-23 MED ORDER — PROSIGHT PO TABS
1.0000 | ORAL_TABLET | Freq: Two times a day (BID) | ORAL | Status: DC
  Administered 2019-06-23 – 2019-06-29 (×13): 1 via ORAL
  Filled 2019-06-23 (×14): qty 1

## 2019-06-23 MED ORDER — FERROUS SULFATE 325 (65 FE) MG PO TABS
325.0000 mg | ORAL_TABLET | Freq: Every day | ORAL | Status: DC
  Administered 2019-06-23 – 2019-06-29 (×7): 325 mg via ORAL
  Filled 2019-06-23 (×7): qty 1

## 2019-06-23 MED ORDER — INSULIN ASPART 100 UNIT/ML ~~LOC~~ SOLN
0.0000 [IU] | Freq: Three times a day (TID) | SUBCUTANEOUS | Status: DC
  Administered 2019-06-23: 1 [IU] via SUBCUTANEOUS
  Administered 2019-06-25: 2 [IU] via SUBCUTANEOUS
  Administered 2019-06-25 – 2019-06-26 (×3): 1 [IU] via SUBCUTANEOUS
  Administered 2019-06-26: 2 [IU] via SUBCUTANEOUS
  Administered 2019-06-27: 1 [IU] via SUBCUTANEOUS
  Administered 2019-06-27 – 2019-06-28 (×3): 2 [IU] via SUBCUTANEOUS
  Administered 2019-06-28: 1 [IU] via SUBCUTANEOUS
  Administered 2019-06-29: 2 [IU] via SUBCUTANEOUS

## 2019-06-23 MED ORDER — ACETAMINOPHEN 325 MG PO TABS
650.0000 mg | ORAL_TABLET | Freq: Four times a day (QID) | ORAL | Status: DC | PRN
  Administered 2019-06-23 – 2019-06-25 (×3): 650 mg via ORAL
  Filled 2019-06-23 (×3): qty 2

## 2019-06-23 MED ORDER — VITAMIN C 500 MG PO TABS
500.0000 mg | ORAL_TABLET | Freq: Two times a day (BID) | ORAL | Status: DC
  Administered 2019-06-23 – 2019-06-29 (×13): 500 mg via ORAL
  Filled 2019-06-23 (×12): qty 1

## 2019-06-23 MED ORDER — POLYETHYLENE GLYCOL 3350 17 G PO PACK
17.0000 g | PACK | Freq: Every day | ORAL | Status: DC
  Administered 2019-06-23 – 2019-06-28 (×6): 17 g via ORAL
  Filled 2019-06-23 (×7): qty 1

## 2019-06-23 MED ORDER — LABETALOL HCL 100 MG PO TABS
100.0000 mg | ORAL_TABLET | Freq: Every day | ORAL | Status: DC
  Administered 2019-06-24 – 2019-06-28 (×6): 100 mg via ORAL
  Filled 2019-06-23 (×8): qty 1

## 2019-06-23 MED ORDER — LABETALOL HCL 200 MG PO TABS
200.0000 mg | ORAL_TABLET | Freq: Every day | ORAL | Status: DC
  Administered 2019-06-23 – 2019-06-29 (×6): 200 mg via ORAL
  Filled 2019-06-23 (×7): qty 1

## 2019-06-23 MED ORDER — LIDOCAINE 5 % EX PTCH: 1.0000 | MEDICATED_PATCH | Freq: Every day | CUTANEOUS | Status: DC | PRN

## 2019-06-23 MED ORDER — TRAZODONE HCL 50 MG PO TABS
50.0000 mg | ORAL_TABLET | Freq: Every evening | ORAL | Status: DC | PRN
  Administered 2019-06-23 – 2019-06-25 (×3): 50 mg via ORAL
  Filled 2019-06-23 (×3): qty 1

## 2019-06-23 MED ORDER — NITROGLYCERIN 0.4 MG SL SUBL: 0.4000 mg | SUBLINGUAL_TABLET | SUBLINGUAL | Status: DC | PRN

## 2019-06-23 MED ORDER — AMIODARONE HCL 200 MG PO TABS
200.0000 mg | ORAL_TABLET | Freq: Every day | ORAL | Status: DC
  Administered 2019-06-23 – 2019-06-29 (×7): 200 mg via ORAL
  Filled 2019-06-23 (×7): qty 1

## 2019-06-23 MED ORDER — VITAMIN D 25 MCG (1000 UNIT) PO TABS
5000.0000 [IU] | ORAL_TABLET | Freq: Every day | ORAL | Status: DC
  Administered 2019-06-23 – 2019-06-29 (×7): 5000 [IU] via ORAL
  Filled 2019-06-23 (×7): qty 5

## 2019-06-23 MED ORDER — LACTINEX PO CHEW
1.0000 | CHEWABLE_TABLET | Freq: Every day | ORAL | Status: DC
  Administered 2019-06-23 – 2019-06-28 (×6): 1 via ORAL
  Filled 2019-06-23 (×10): qty 1

## 2019-06-23 MED ORDER — SODIUM CHLORIDE 0.9 % IV SOLN
200.0000 mg | Freq: Once | INTRAVENOUS | Status: AC
  Administered 2019-06-23: 200 mg via INTRAVENOUS
  Filled 2019-06-23: qty 40

## 2019-06-23 MED ORDER — PREDNISOLONE ACETATE 1 % OP SUSP
1.0000 [drp] | Freq: Every day | OPHTHALMIC | Status: DC
  Administered 2019-06-23 – 2019-06-29 (×7): 1 [drp] via OPHTHALMIC
  Filled 2019-06-23 (×2): qty 5

## 2019-06-23 MED ORDER — MIRABEGRON ER 25 MG PO TB24
25.0000 mg | ORAL_TABLET | Freq: Every day | ORAL | Status: DC
  Administered 2019-06-23 – 2019-06-29 (×7): 25 mg via ORAL
  Filled 2019-06-23 (×7): qty 1

## 2019-06-23 MED ORDER — DILTIAZEM HCL ER COATED BEADS 120 MG PO CP24
120.0000 mg | ORAL_CAPSULE | Freq: Every day | ORAL | Status: DC
  Administered 2019-06-23 – 2019-06-29 (×7): 120 mg via ORAL
  Filled 2019-06-23 (×7): qty 1

## 2019-06-23 MED ORDER — ACETAMINOPHEN 650 MG RE SUPP: 650.0000 mg | Freq: Four times a day (QID) | RECTAL | Status: DC | PRN

## 2019-06-23 MED ORDER — ACYCLOVIR 400 MG PO TABS
800.0000 mg | ORAL_TABLET | Freq: Two times a day (BID) | ORAL | Status: DC
  Administered 2019-06-24 – 2019-06-29 (×12): 800 mg via ORAL
  Filled 2019-06-23 (×3): qty 2
  Filled 2019-06-23: qty 1
  Filled 2019-06-23 (×3): qty 2
  Filled 2019-06-23: qty 1
  Filled 2019-06-23 (×4): qty 2
  Filled 2019-06-23: qty 1
  Filled 2019-06-23 (×4): qty 2

## 2019-06-23 MED ORDER — APIXABAN 5 MG PO TABS
5.0000 mg | ORAL_TABLET | Freq: Two times a day (BID) | ORAL | Status: DC
  Administered 2019-06-23 – 2019-06-29 (×13): 5 mg via ORAL
  Filled 2019-06-23 (×14): qty 1

## 2019-06-23 MED ORDER — DEXAMETHASONE SODIUM PHOSPHATE 10 MG/ML IJ SOLN
6.0000 mg | INTRAMUSCULAR | Status: DC
  Administered 2019-06-23 – 2019-06-29 (×7): 6 mg via INTRAVENOUS
  Filled 2019-06-23 (×6): qty 1

## 2019-06-23 MED ORDER — SODIUM CHLORIDE 0.9 % IV SOLN
100.0000 mg | Freq: Every day | INTRAVENOUS | Status: AC
  Administered 2019-06-24 – 2019-06-27 (×4): 100 mg via INTRAVENOUS
  Filled 2019-06-23 (×4): qty 20

## 2019-06-23 MED ORDER — AMLODIPINE BESYLATE 5 MG PO TABS
5.0000 mg | ORAL_TABLET | Freq: Every day | ORAL | Status: DC
  Administered 2019-06-23 – 2019-06-29 (×7): 5 mg via ORAL
  Filled 2019-06-23 (×7): qty 1

## 2019-06-23 MED ORDER — MAGNESIUM CHLORIDE 64 MG PO TBEC
1.0000 | DELAYED_RELEASE_TABLET | Freq: Every day | ORAL | Status: DC
  Administered 2019-06-23 – 2019-06-29 (×7): 64 mg via ORAL
  Filled 2019-06-23 (×7): qty 1

## 2019-06-23 MED ORDER — ONDANSETRON HCL 4 MG/2ML IJ SOLN: 4.0000 mg | Freq: Four times a day (QID) | INTRAMUSCULAR | Status: DC | PRN

## 2019-06-23 MED ORDER — LORAZEPAM 2 MG/ML IJ SOLN
0.5000 mg | Freq: Once | INTRAMUSCULAR | Status: AC
  Administered 2019-06-24: 0.5 mg via INTRAVENOUS
  Filled 2019-06-23: qty 1

## 2019-06-23 MED ORDER — ONDANSETRON HCL 4 MG PO TABS: 4.0000 mg | ORAL_TABLET | Freq: Four times a day (QID) | ORAL | Status: DC | PRN

## 2019-06-23 MED ORDER — TIMOLOL MALEATE 0.5 % OP SOLN
1.0000 [drp] | Freq: Two times a day (BID) | OPHTHALMIC | Status: DC
  Administered 2019-06-23 – 2019-06-29 (×13): 1 [drp] via OPHTHALMIC
  Filled 2019-06-23 (×3): qty 5

## 2019-06-23 MED ORDER — VITAMIN B-12 1000 MCG PO TABS
1000.0000 ug | ORAL_TABLET | Freq: Every day | ORAL | Status: DC
  Administered 2019-06-23 – 2019-06-29 (×7): 1000 ug via ORAL
  Filled 2019-06-23 (×7): qty 1

## 2019-06-23 NOTE — ED Notes (Signed)
Lunch tray ordered 

## 2019-06-23 NOTE — ED Notes (Signed)
Dinner meal ordered. 

## 2019-06-23 NOTE — Progress Notes (Signed)
Pt admitted with hypoxia. Found to be COVID positive. See H&P for full details. During interview, requiring 2L [place]. Continue decadron, remdesivir. Trend inflammatory markers.AM labs ordered. She denies complaints. Right renal cyst noted on CT. Rec'd [place] to determine if cyst or mass. Order placed. Otherwise, continue as per H&P.   General: 85 y.o. female resting in bed in NAD Cardiovascular: RRR, +S1, S2, no m/g/r, equal pulses throughout Respiratory: CTABL, no w/r/r, normal WOB GI: BS+, NDNT, no masses noted, no organomegaly noted MSK: No e/c/c Neuro: alert to name, follows commands Psyc: calm/cooperative  .Jonnie Finner, DO

## 2019-06-23 NOTE — ED Notes (Signed)
Heart healthy/ carb modified breakfast tray ordered  

## 2019-06-23 NOTE — ED Notes (Signed)
Paged attending about possible [facility] bed availability

## 2019-06-23 NOTE — ED Notes (Signed)
Call daughter Manuela Schwartz at [phone]

## 2019-06-23 NOTE — ED Notes (Signed)
Pt's oxygen saturation drops to 89-90% on RA. RN placed pt on 2.5 liters [place]. Pt's O2 sat maintains 95% or higher.

## 2019-06-23 NOTE — ED Notes (Signed)
Patient CBG was 150. 

## 2019-06-23 NOTE — H&P (Signed)
History and Physical    Tracey Porter ZOX:[id number] DOB: 07/14/1929 DOA: 06/22/2019  PCP: [name], Henry, MD  Patient coming from: [facility]illed nursing facility.  Chief Complaint: Covid positive with some nausea.  History obtained from patient's daughter who is also the healthcare power of attorney.  HPI: Tracey Porter is a 85 y.o. female with memory issues with undiagnosed dementia, diabetes mellitus on diet, hypertension, paroxysmal atrial fibrillation recently had right hip fracture and is on rehab was found to be positive for Covid and but hypoxic and was transferred to the ER.  Per patient's daughter patient also has been having some nausea last couple of days.  Patient usually has very poor memory and that is at baseline as per the daughter and is not able to contribute much to the history.  ED Course: In the ER patient was found to be hypoxic with minimal exertion and later on became hypoxic when at rest.  Chest x-ray shows a more density concerning for eventration of diaphragm.  Covid test is positive labs show sodium of 134 mildly elevated AST and ALT ferritin of 311 CRP 6.5.  After discussing with patient's daughter patient was started on Decadron and remdesivir.  Review of Systems: As per HPI, rest all negative.   Past Medical History:  Diagnosis Date  . Diabetes mellitus without complication (HCC)   . Hypertension     Past Surgical History:  Procedure Laterality Date  . ABDOMINAL HYSTERECTOMY  1976  . EYE SURGERY    . HIP ARTHROPLASTY Right 05/03/2018   Procedure: ARTHROPLASTY BIPOLAR HIP (HEMIARTHROPLASTY);  Surgeon: Roby LoftsHaddix, Kevin P, MD;  Location: [facility] OR;  Service: Orthopedics;  Laterality: Right;     reports that she has quit smoking. She has never used smokeless tobacco. She reports current alcohol use. She reports that she does not use drugs.  Allergies  Allergen Reactions  . Penicillins Rash    Family History  Problem Relation Age of Onset  . Lung cancer  Father   . Lymphoma Daughter     Prior to Admission medications   Medication Sig Start Date End Date Taking? Authorizing Provider  acetaminophen (TYLENOL) 500 MG tablet Take 1,000 mg by mouth 2 (two) times daily.    Yes [provider]  acetaminophen (TYLENOL) 500 MG tablet Take 500 mg by mouth every 4 (four) hours as needed for mild pain or headache.   Yes [provider]  acyclovir (ZOVIRAX) 800 MG tablet Take 800 mg by mouth 2 (two) times daily.   Yes [provider]  amiodarone (PACERONE) 200 MG tablet Take 200 mg by mouth daily.   Yes [provider]  amLODipine (NORVASC) 5 MG tablet Take 5 mg by mouth daily.   Yes [provider]  apixaban (ELIQUIS) 5 MG TABS tablet Take 1 tablet (5 mg total) by mouth 2 (two) times daily. Please cancel previous rx's sent; call pt with price before filling 02/24/17  Yes Azalee CourseMeng, Hao, [place]  Cholecalciferol (D-3-5) 5000 units capsule Take 5,000 Units by mouth daily.   Yes [provider]  conjugated estrogens (PREMARIN) vaginal cream Place 1 Applicatorful vaginally every other day.   Yes [provider]  diltiazem (CARDIZEM CD) 120 MG 24 hr capsule Take 1 capsule (120 mg total) by mouth daily. 05/30/18  Yes Lennette BihariKelly, Thomas A, MD  ferrous sulfate 325 (65 FE) MG tablet Take 325 mg by mouth daily with breakfast.   Yes [provider]  labetalol (NORMODYNE) 100 MG tablet Take 100-200  mg by mouth See admin instructions. Take 2 tablets every morning and take 1 tablet at bedtime   Yes [provider]  Lactobacillus Rhamnosus, GG, (CULTURELLE KIDS) CHEW Chew 1 tablet by mouth daily.   Yes [provider]  lidocaine (LIDODERM) 5 % Place 1 patch onto the skin as needed (for pain).   Yes [provider]  loperamide (IMODIUM A-D) 2 MG tablet Take 2 mg by mouth as needed for diarrhea or loose stools.   Yes [provider]  magnesium chloride (SLOW-MAG) 64 MG TBEC SR tablet  Take 1 tablet by mouth daily.    Yes [provider]  Multiple Vitamins-Minerals (ICAPS PLUS) TABS Take 1 tablet by mouth 2 (two) times daily.   Yes [provider]  MYRBETRIQ 25 MG TB24 tablet Take 25 mg by mouth daily. 03/04/17  Yes [provider]  nitroGLYCERIN (NITROSTAT) 0.4 MG SL tablet Place 0.4 mg under the tongue every 5 (five) minutes as needed for chest pain.   Yes [provider]  ondansetron (ZOFRAN) 8 MG tablet Take 8 mg by mouth 2 (two) times daily as needed for nausea or vomiting.   Yes [provider]  pantoprazole (PROTONIX) 40 MG tablet Take 40 mg by mouth daily.   Yes [provider]  Polyethyl Glycol-Propyl Glycol (SYSTANE) 0.4-0.3 % SOLN Apply 1 drop to eye 2 (two) times daily.   Yes [provider]  polyethylene glycol (MIRALAX / GLYCOLAX) packet Take 17 g by mouth daily. 05/04/18  Yes Amin, Loura Halt, MD  prednisoLONE acetate (PRED FORTE) 1 % ophthalmic suspension Place 1 drop into the right eye daily.  03/19/17  Yes [provider]  senna-docusate (SENOKOT-S) 8.6-50 MG tablet Take 1 tablet by mouth daily as needed for mild constipation.   Yes [provider]  timolol (BETIMOL) 0.5 % ophthalmic solution Place 1 drop into both eyes 2 (two) times daily.   Yes [provider]  traZODone (DESYREL) 50 MG tablet Take 50 mg by mouth at bedtime as needed for sleep.   Yes [provider]  vitamin B-12 (CYANOCOBALAMIN) 1000 MCG tablet Take 1,000 mcg by mouth daily.   Yes [provider]  vitamin C (ASCORBIC ACID) 500 MG tablet Take 500 mg by mouth 2 (two) times daily.   Yes [provider]  labetalol (NORMODYNE) 200 MG tablet Take 1 tablet (200 mg total) by mouth every morning AND 0.5 tablets (100 mg total) at bedtime. Patient not taking: Reported on 06/23/2019 05/30/18   Lennette Bihari, MD  loperamide (IMODIUM) 2 MG capsule Take 1 capsule (2 mg total) by mouth as  needed for diarrhea or loose stools. Patient not taking: Reported on 06/23/2019 03/31/17   Tyrone Nine, MD    Physical Exam: Constitutional: Moderately built and nourished. Vitals:   06/23/19 0000 06/23/19 0239 06/23/19 0242 06/23/19 0245  BP: (!) 159/85 134/68    Pulse: 88 84 84 81  Resp:  14 (!) 30 (!) 22  Temp:      TempSrc:      SpO2: 94%  95% 97%  Weight:      Height:       Eyes: Anicteric no pallor. ENMT: No discharge from the ears eyes nose or mouth. Neck: No mass felt.  No neck rigidity. Respiratory: No rhonchi or crepitations. Cardiovascular: S1-S2 heard. Abdomen: Soft nontender bowel sounds present. Musculoskeletal: No edema. Skin: No rash. Neurologic: Alert awake oriented to name otherwise confused moves all extremities.  Psychiatric: Appears normal but normal affect.   Labs on Admission: I have personally reviewed following labs and imaging studies  CBC: Recent Labs  Lab 06/23/19 0055  WBC 6.7  NEUTROABS 4.8  HGB 12.3  HCT 36.9  MCV 99.7  PLT 371   Basic Metabolic Panel: Recent Labs  Lab 06/23/19 0055  NA 134*  K 3.6  CL 97*  CO2 24  GLUCOSE 100*  BUN 14  CREATININE 0.66  CALCIUM 8.9   GFR: Estimated Creatinine Clearance: 50.2 mL/min (by C-G formula based on SCr of 0.66 mg/dL). Liver Function Tests: No results for input(s): AST, ALT, ALKPHOS, BILITOT, PROT, ALBUMIN in the last 168 hours. No results for input(s): LIPASE, AMYLASE in the last 168 hours. No results for input(s): AMMONIA in the last 168 hours. Coagulation Profile: No results for input(s): INR, PROTIME in the last 168 hours. Cardiac Enzymes: No results for input(s): CKTOTAL, CKMB, CKMBINDEX, TROPONINI in the last 168 hours. BNP (last 3 results) No results for input(s): PROBNP in the last 8760 hours. HbA1C: No results for input(s): HGBA1C in the last 72 hours. CBG: No results for input(s): GLUCAP in the last 168 hours. Lipid Profile: No results for input(s): CHOL, HDL,  LDLCALC, TRIG, CHOLHDL, LDLDIRECT in the last 72 hours. Thyroid Function Tests: No results for input(s): TSH, T4TOTAL, FREET4, T3FREE, THYROIDAB in the last 72 hours. Anemia Panel: Recent Labs    06/23/19 0055  FERRITIN 311*   Urine analysis:    Component Value Date/Time   COLORURINE YELLOW 02/22/2018 2003   APPEARANCEUR CLEAR 02/22/2018 2003   LABSPEC 1.011 02/22/2018 2003   PHURINE 6.0 02/22/2018 2003   GLUCOSEU NEGATIVE 02/22/2018 2003   HGBUR NEGATIVE 02/22/2018 2003   [place] NEGATIVE 02/22/2018 2003   [place] NEGATIVE 02/22/2018 2003   PROTEINUR NEGATIVE 02/22/2018 2003   UROBILINOGEN 0.2 03/19/2015 1945   NITRITE NEGATIVE 02/22/2018 2003   LEUKOCYTESUR SMALL (A) 02/22/2018 2003   Sepsis Labs: @LABRCNTIP (procalcitonin:4,lacticidven:4) )No results found for this or any previous visit (from the past 240 hour(s)).   Radiological Exams on Admission: DG Chest Port 1 View  Result Date: 06/22/2019 CLINICAL DATA:  COVID-19 positive, hypoxia EXAM: PORTABLE CHEST 1 VIEW COMPARISON:  03/26/2017 FINDINGS: Cardiomegaly. Calcified aortic knob. There is a well-defined ovoid density at the right lung base which may reflect focal eventration of the right hemidiaphragm. Coarsened interstitial markings bilaterally. No pleural effusion or pneumothorax. IMPRESSION: Well-defined ovoid density at the right lung base may reflect focal eventration of the right hemidiaphragm. Repeat PA and lateral chest radiographs could be confirmed to further evaluate this finding. Electronically Signed   By: Davina Poke M.D.   On: 06/22/2019 20:17    Assessment/Plan Principal Problem:   Acute respiratory disease due to COVID-19 virus Active Problems:   Essential hypertension   PAF (paroxysmal atrial fibrillation) (HCC)   Acute respiratory failure with hypoxia (HCC)    1. Acute respiratory failure with hypoxia secondary to Covid pneumonia for which I have discussed with patient's daughter Ms.  Randye Lobo who agrees with starting patient on Decadron and remdesivir.  Closely follow respiratory status inflammatory markers. 2. Nausea with mildly elevated LFTs will check CT abdomen.  Abdomen on exam appears benign.  Note that patient is on amiodarone which may need to be held if LFTs significantly worsens. 3. Paroxysmal atrial fibrillation is on apixaban for anticoagulation and amiodarone Cardizem for heart rate control. 4. Hypertension on amlodipine labetalol patient is on Cardizem also. 5. Memory issues with possible dementia.  6. Recent right hip fracture status post surgery. 7. Diabetes mellitus type 2 on diet.  Given that patient is Covid positive with respiratory failure will need more than 2 midnight stay.  Inpatient status.   DVT prophylaxis: Apixaban. Code Status: Full code confirmed with patient's daughter who is also healthcare power of attorney. Family Communication: Patient's daughter. Disposition Plan: Back to rehab when stable. Consults called: None. Admission status: Inpatient.   Eduard Clos MD [facility] Pager [phone].  If 7PM-7AM, please contact night-coverage [email or web address] Password [facility]  06/23/2019, 3:49 AM

## 2019-06-23 NOTE — ED Notes (Signed)
RN messaged pharmacy about pt's remdesivir. Pharmacy informed RN that they need CMP to result before verifying med. RN attempting to draw CMP now.

## 2019-06-23 NOTE — ED Notes (Signed)
RN educated pt x2 to keep self on monitor. PT continually pulling leads off, taking O2 [place] off. Pt's O2 sat will go down to 88% on RA. Pt educated on the importance of staying on the monitor.

## 2019-06-23 NOTE — ED Notes (Addendum)
Pt placed back on the monitor and O2 after taking self off.

## 2019-06-23 NOTE — ED Notes (Signed)
Pt taken self off monitor again and educated again on keeping self on.

## 2019-06-23 NOTE — ED Notes (Signed)
Pt agitated, getting out of bed, pt continually pulling off lines. Pulled out IV. RN paged MD for medication for anxiety/agitation.

## 2019-06-23 NOTE — ED Notes (Signed)
Dr Leonides Schanz informed of pos covid

## 2019-06-24 ENCOUNTER — Inpatient Hospital Stay (HOSPITAL_COMMUNITY): Payer: Medicare Other

## 2019-06-24 DIAGNOSIS — E119 Type 2 diabetes mellitus without complications: Secondary | ICD-10-CM

## 2019-06-24 LAB — COMPREHENSIVE METABOLIC PANEL
ALT: 83 U/L — ABNORMAL HIGH (ref 0–44)
AST: 64 U/L — ABNORMAL HIGH (ref 15–41)
Albumin: 3.3 g/dL — ABNORMAL LOW (ref 3.5–5.0)
Alkaline Phosphatase: 66 U/L (ref 38–126)
Anion gap: 15 (ref 5–15)
BUN: 24 mg/dL — ABNORMAL HIGH (ref 8–23)
CO2: 22 mmol/L (ref 22–32)
Calcium: 9 mg/dL (ref 8.9–10.3)
Chloride: 98 mmol/L (ref 98–111)
Creatinine, Ser: 1.01 mg/dL — ABNORMAL HIGH (ref 0.44–1.00)
GFR calc Af Amer: 58 mL/min — ABNORMAL LOW (ref >60)
GFR calc non Af Amer: 50 mL/min — ABNORMAL LOW (ref >60)
Glucose, Bld: 124 mg/dL — ABNORMAL HIGH (ref 70–99)
Potassium: 4.1 mmol/L (ref 3.5–5.1)
Sodium: 135 mmol/L (ref 135–145)
Total Bilirubin: 0.8 mg/dL (ref 0.3–1.2)
Total Protein: 7 g/dL (ref 6.5–8.1)

## 2019-06-24 LAB — CBC WITH DIFFERENTIAL/PLATELET
Abs Immature Granulocytes: 0.07 10*3/uL (ref 0.00–0.07)
Basophils Absolute: 0 10*3/uL (ref 0.0–0.1)
Basophils Relative: 0 %
Eosinophils Absolute: 0 10*3/uL (ref 0.0–0.5)
Eosinophils Relative: 0 %
HCT: 37.3 % (ref 36.0–46.0)
Hemoglobin: 12.5 g/dL (ref 12.0–15.0)
Immature Granulocytes: 1 %
Lymphocytes Relative: 11 %
Lymphs Abs: 0.7 10*3/uL (ref 0.7–4.0)
MCH: 33.2 pg (ref 26.0–34.0)
MCHC: 33.5 g/dL (ref 30.0–36.0)
MCV: 98.9 fL (ref 80.0–100.0)
Monocytes Absolute: 0.9 10*3/uL (ref 0.1–1.0)
Monocytes Relative: 14 %
Neutro Abs: 4.7 10*3/uL (ref 1.7–7.7)
Neutrophils Relative %: 74 %
Platelets: 349 10*3/uL (ref 150–400)
RBC: 3.77 MIL/uL — ABNORMAL LOW (ref 3.87–5.11)
RDW: 13.2 % (ref 11.5–15.5)
WBC: 6.3 10*3/uL (ref 4.0–10.5)
nRBC: 0 % (ref 0.0–0.2)

## 2019-06-24 LAB — PHOSPHORUS: Phosphorus: 3.5 mg/dL (ref 2.5–4.6)

## 2019-06-24 LAB — D-DIMER, QUANTITATIVE: D-Dimer, Quant: 0.74 ug/mL-FEU — ABNORMAL HIGH (ref 0.00–0.50)

## 2019-06-24 LAB — CBG MONITORING, ED
Glucose-Capillary: 106 mg/dL — ABNORMAL HIGH (ref 70–99)
Glucose-Capillary: 101 mg/dL — ABNORMAL HIGH (ref 70–99)
Glucose-Capillary: 88 mg/dL (ref 70–99)

## 2019-06-24 LAB — C-REACTIVE PROTEIN: CRP: 6.1 mg/dL — ABNORMAL HIGH (ref <1.0)

## 2019-06-24 LAB — MAGNESIUM: Magnesium: 2.2 mg/dL (ref 1.7–2.4)

## 2019-06-24 LAB — FERRITIN: Ferritin: 363 ng/mL — ABNORMAL HIGH (ref 11–307)

## 2019-06-24 LAB — GLUCOSE, CAPILLARY: Glucose-Capillary: 79 mg/dL (ref 70–99)

## 2019-06-24 IMAGING — CT CT ANGIO CHEST
2 of 6 series · 18 of 46 positions shown · IV contrast (omnipaque)
Comparison: None.

CLINICAL DATA: Shortness of breath, elevated D-dimer

EXAM:
CT ANGIOGRAPHY CHEST WITH CONTRAST
TECHNIQUE: Multidetector CT imaging of the chest was performed using the
standard protocol during bolus administration of intravenous
contrast. Multiplanar CT image reconstructions and MIPs were
obtained to evaluate the vascular anatomy.
CONTRAST:  75mL OMNIPAQUE IOHEXOL 350 MG/ML SOLN

[Series 6: thins · axial · 0.76mm/px · z∈[-266,-10]mm · 15 of 282 slices shown]
[im 13/282  lung]
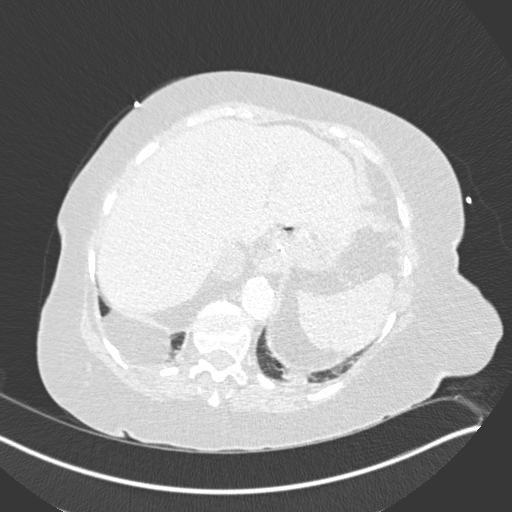
[im 37/282  soft-tissue]
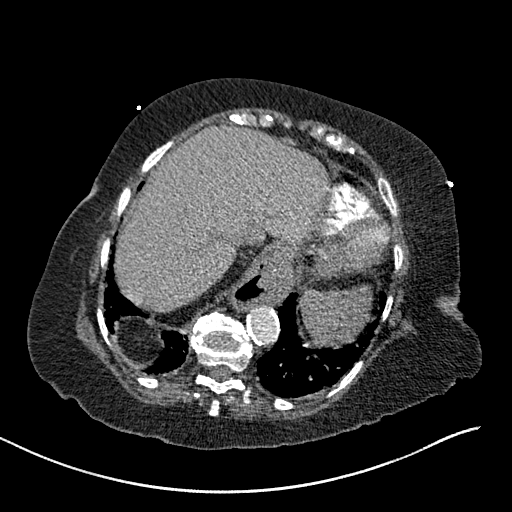
[im 49/282  lung]
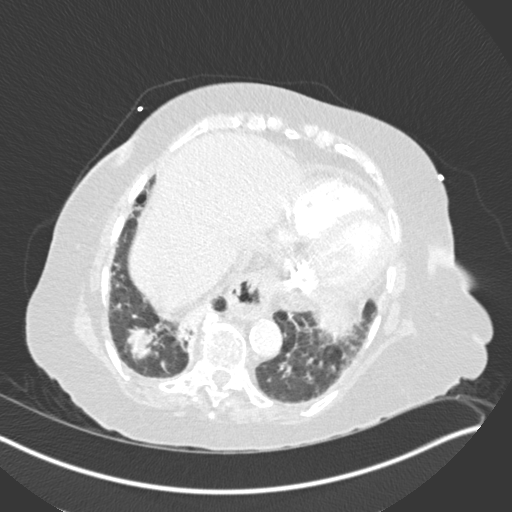
[im 74/282  soft-tissue]
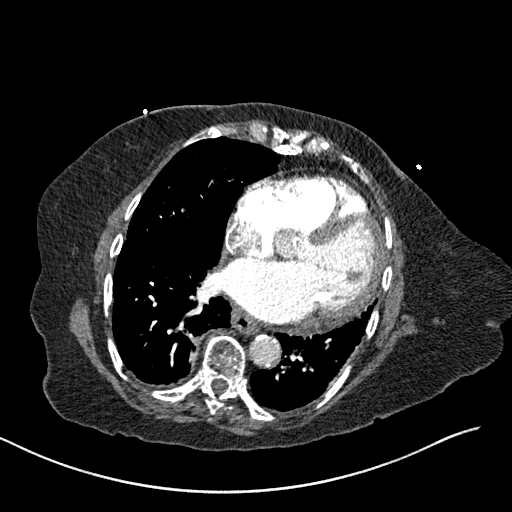
[im 86/282  lung]
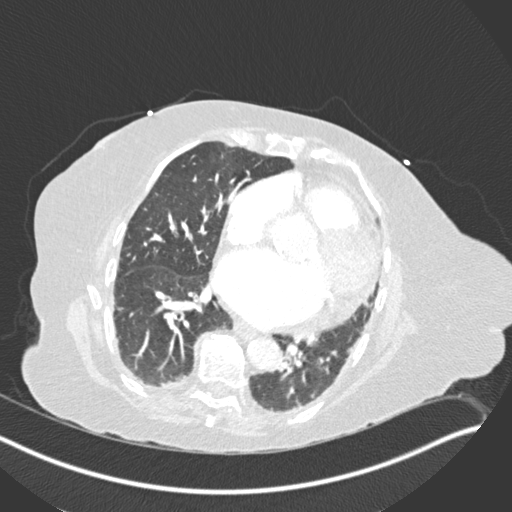
[im 110/282  soft-tissue]
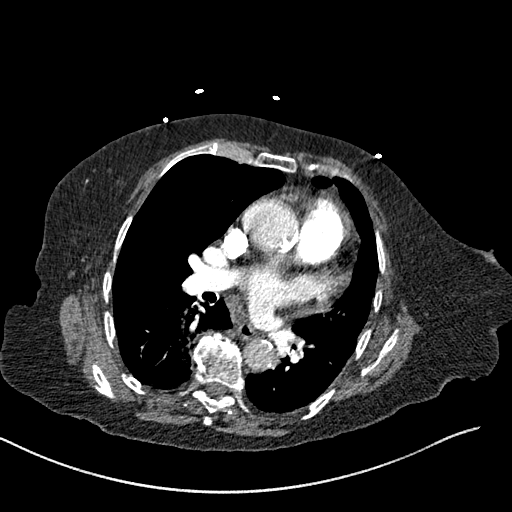
[im 123/282  lung]
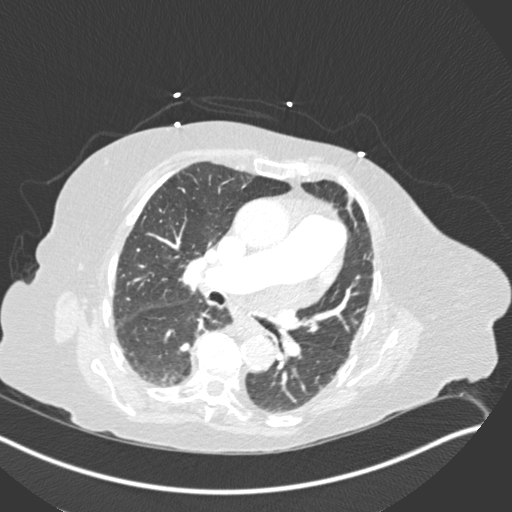
[im 147/282  soft-tissue]
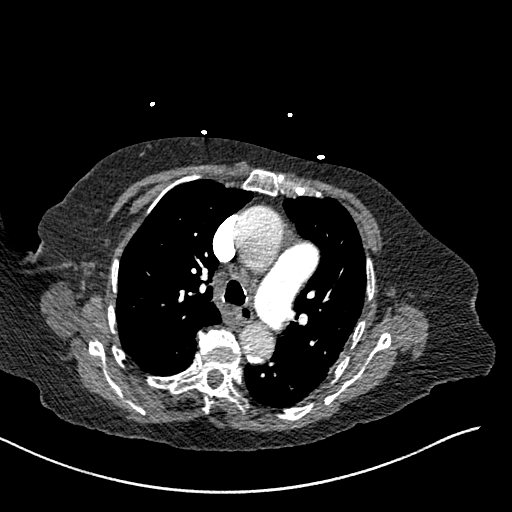
[im 159/282  lung]
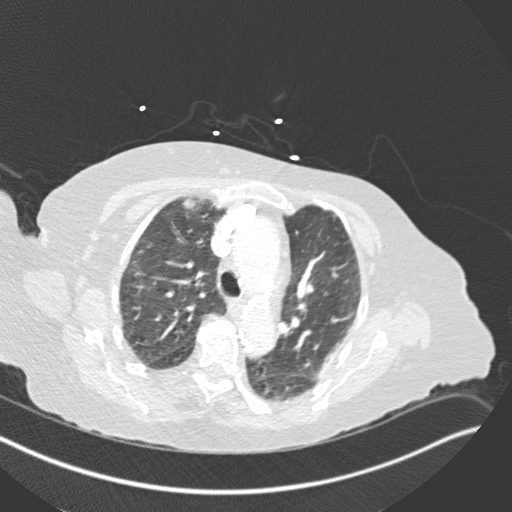
[im 172/282  soft-tissue]
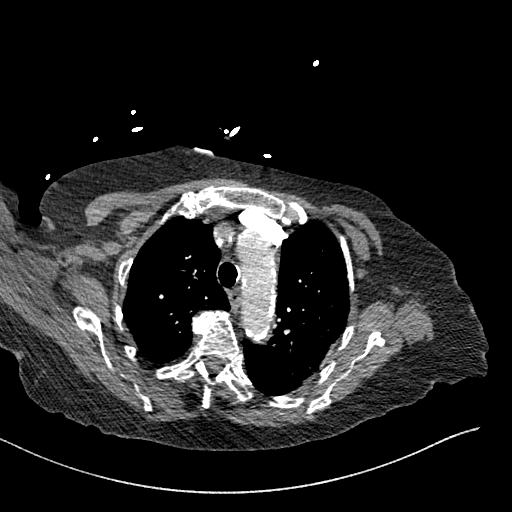
[im 196/282  lung]
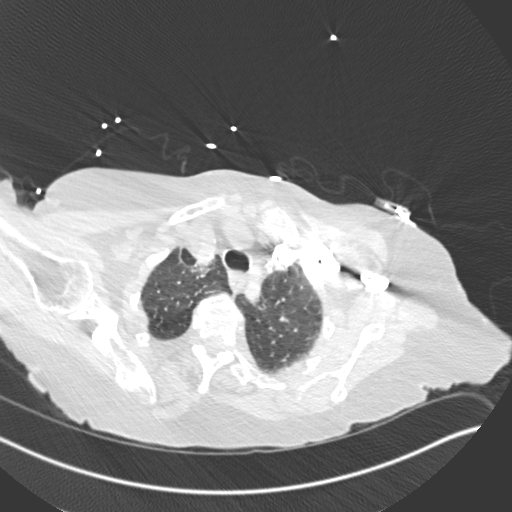
[im 208/282  soft-tissue]
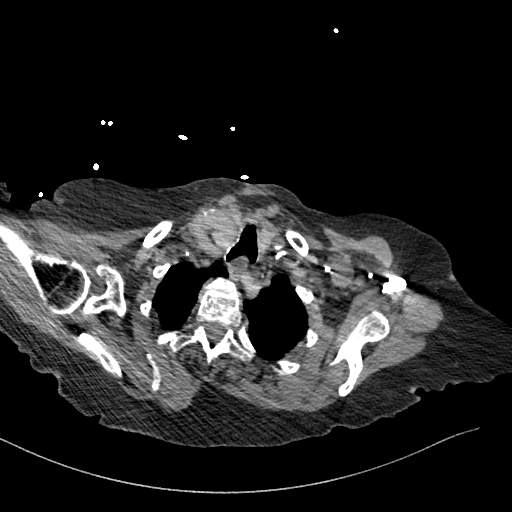
[im 233/282  lung]
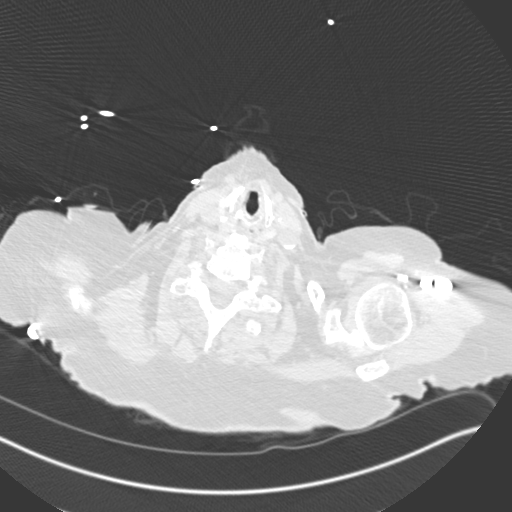
[im 245/282  soft-tissue]
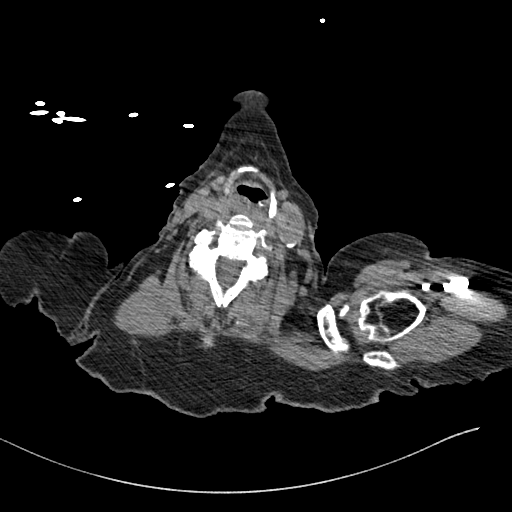
[im 269/282  lung]
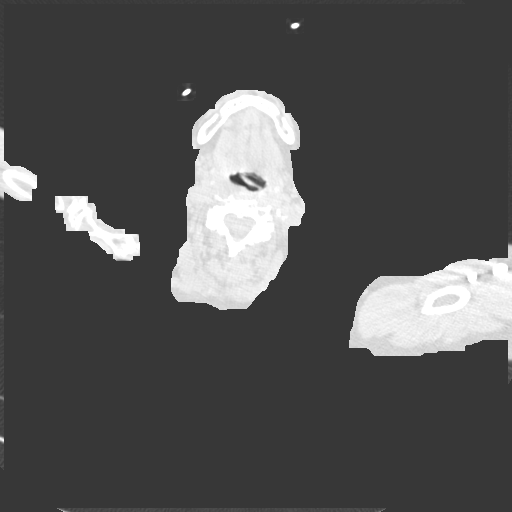

[Series 8: coronal mpr · coronal · 0.49mm/px · 3 of 148 slices shown]
[im 37/148  soft-tissue]
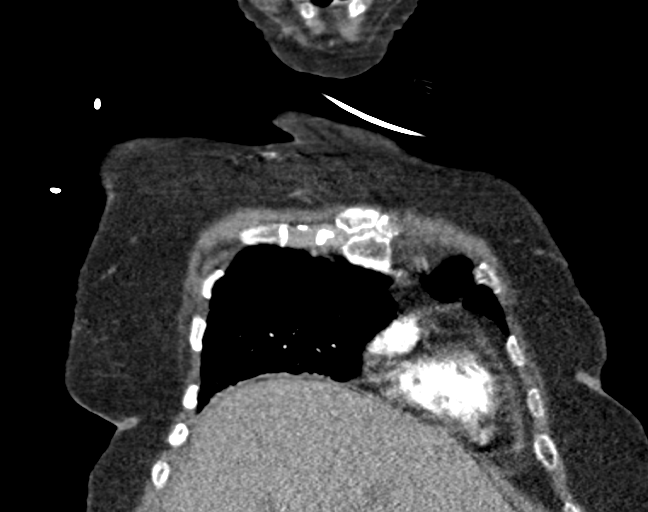
[im 74/148  soft-tissue]
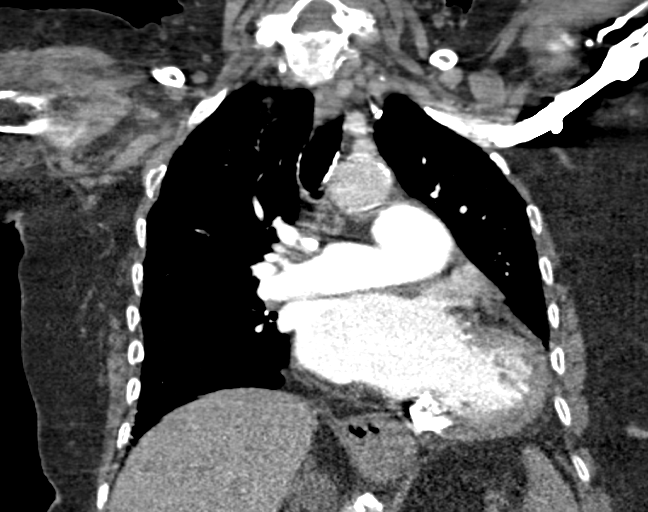
[im 111/148  soft-tissue]
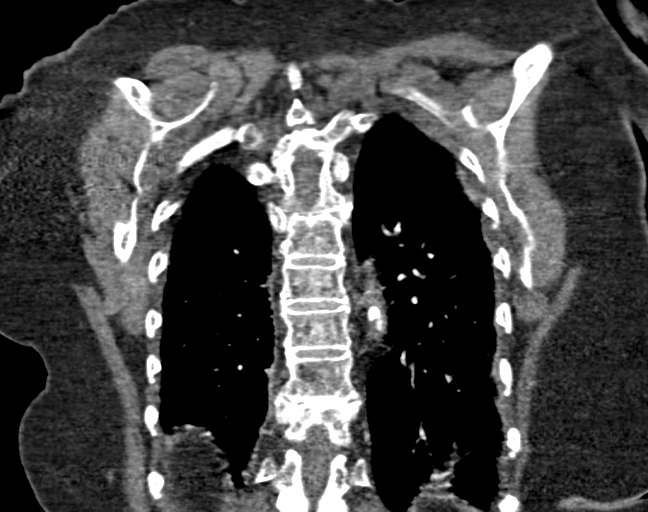

[18 of 46 positions shown; findings below may reference images not displayed]

FINDINGS: Cardiovascular: Satisfactory opacification of the pulmonary arteries
to the segmental level. No evidence of pulmonary embolism. Mild
cardiomegaly. No pericardial effusion. Aortic atherosclerosis.

Mediastinum/Nodes: No enlarged mediastinal, hilar, or axillary lymph
nodes. Thyroid gland, trachea, and esophagus demonstrate no
significant findings.

Lungs/Pleura: Scarring and/or partial atelectasis of the lower
lungs. No pleural effusion or pneumothorax.

Upper Abdomen: No acute abnormality. Partially imaged, fat
containing benign right adrenal adenoma.

Musculoskeletal: No chest wall abnormality. No acute or significant
osseous findings. Disc degenerative disease and ankylosis of the
thoracic spine.

Review of the MIP images confirms the above findings.
IMPRESSION: 1. Negative examination for pulmonary embolism.
2. Scarring and/or partial atelectasis of the lower lungs.
3. Cardiomegaly
4.  Aortic Atherosclerosis ([id number]).

## 2019-06-24 MED ORDER — IOHEXOL 350 MG/ML SOLN
75.0000 mL | Freq: Once | INTRAVENOUS | Status: AC | PRN
  Administered 2019-06-24: 75 mL via INTRAVENOUS

## 2019-06-24 MED ORDER — HYDROCOD POLST-CPM POLST ER 10-8 MG/5ML PO SUER: 5.0000 mL | Freq: Two times a day (BID) | ORAL | Status: DC | PRN

## 2019-06-24 MED ORDER — SODIUM CHLORIDE 0.9 % IV SOLN
50.0000 mL/h | INTRAVENOUS | Status: AC
  Administered 2019-06-24: 50 mL/h via INTRAVENOUS

## 2019-06-24 MED ORDER — GUAIFENESIN-DM 100-10 MG/5ML PO SYRP: 5.0000 mL | ORAL_SOLUTION | ORAL | Status: DC | PRN

## 2019-06-24 NOTE — ED Notes (Signed)
Patient's daughter Jennifer/power of attorney updated on plan of care.

## 2019-06-24 NOTE — ED Notes (Signed)
Attempted to Call Report to 5W

## 2019-06-24 NOTE — ED Notes (Signed)
Pt continues to walk around in room without walker and without assistance after being advised not to. MD paged d/t pt's agitation.

## 2019-06-24 NOTE — ED Notes (Addendum)
Attempted to call report to 5W 

## 2019-06-24 NOTE — ED Notes (Signed)
Tele ordered bfast 

## 2019-06-24 NOTE — ED Notes (Addendum)
Pt off monitor again. Pt taking trash out of the trash can, pulling things off the wall, digging through the drawers in the room. Pt is also walking around in room without walker. Pt advised multiple times to not get out of bed without assistance. Pt is not easily reoriented.

## 2019-06-24 NOTE — ED Notes (Addendum)
CT unable to use line to R upper forearm, pt reports sensation of swelling at site of prior accidental removal of access above this site when R forearm IV flushed. Attempted access to L AC, unsuccessful, IV team consult obtained. Pt lunch delivered, appetite good.

## 2019-06-24 NOTE — Progress Notes (Signed)
[name].  PROGRESS NOTE    Tracey PingConstance Pretlow  ZOX:[id number] DOB: 12/01/1929 DOA: 06/22/2019 PCP: [name], Henry, MD   Brief Narrative:   Tracey Porter is a 85 y.o. female with memory issues with undiagnosed dementia, diabetes mellitus on diet, hypertension, paroxysmal atrial fibrillation recently had right hip fracture and is on rehab was found to be positive for Covid and but hypoxic and was transferred to the ER.  Per patient's daughter patient also has been having some nausea last couple of days.  Patient usually has very poor memory and that is at baseline as per the daughter and is not able to contribute much to the history.  06/24/19: No acute events ON. Denies complaints this AM. Respiratory status good. Inflammatory markers stable. Continue remdes/steroids. Scr trending up; get a little fluids today.    Assessment & Plan:   Principal Problem:   Acute respiratory disease due to COVID-19 virus Active Problems:   Essential hypertension   PAF (paroxysmal atrial fibrillation) (HCC)   Acute respiratory failure with hypoxia (HCC)   Acute respiratory failure with hypoxia secondary to Covid pneumonia     - remdesivir (Day 2), decacron     - PRN robitussin, tussionex  Nausea with mildly elevated LFTs     - denies nausea this AM     - CT renal stone study obtained yesterday; no liver abnormalities seen on exam     - check hepatitis panel     - PRN zofran  Paroxysmal atrial fibrillation     - apixaban, amiodarone, cardizem     - stable, monitor  Hypertension     - amlodipine, labetalol, cardizem  Memory issues with possible dementia     - will need outpt follow up; must be outside of acute illness for dementia w/u  Diabetes mellitus type 2     - diet controlled     - check A1c  Recent right hip fracture status post surgery  DVT prophylaxis: eliquis Code Status: FULL Disposition Plan: TBD  Antimicrobials:  . Remdesivir   ROS:  Denies CP, dyspnea, N, V. Remainder 10-pt  ROS is negative for all not previously mentioned.  Subjective: "You think i'm doing ok?"  Objective: Vitals:   06/24/19 1000 06/24/19 1015 06/24/19 1037 06/24/19 1052  BP: (!) 117/58 124/64 (!) 117/52 (!) 114/47  Pulse: 76 74  80  Resp:      Temp:      TempSrc:      SpO2: 98% 96%    Weight:      Height:        Intake/Output Summary (Last 24 hours) at 06/24/2019 1406 Last data filed at 06/24/2019 1118 Gross per 24 hour  Intake 95.79 ml  Output --  Net 95.79 ml   Filed Weights   06/22/19 1909  Weight: 71.6 kg    Examination:  General: 85 y.o. female resting in bed in NAD Cardiovascular: RRR, +S1, S2, no m/g/r Respiratory: CTABL, no w/r/r, normal WOB GI: BS+, NDNT, soft MSK: No e/c/c Neuro: alert to name, follows commands Psyc: Appropriate interaction and affect, calm/cooperative   Data Reviewed: I have personally reviewed following labs and imaging studies.  CBC: Recent Labs  Lab 06/23/19 0055 06/24/19 0500  WBC 6.7 6.3  NEUTROABS 4.8 4.7  HGB 12.3 12.5  HCT 36.9 37.3  MCV 99.7 98.9  PLT 302 349   Basic Metabolic Panel: Recent Labs  Lab 06/23/19 0055 06/23/19 0634 06/24/19 0415  NA 134* 135 135  K 3.6 3.6  4.1  CL 97* 97* 98  CO2 24 27 22   GLUCOSE 100* 96 124*  BUN 14 11 24*  CREATININE 0.66 0.89 1.01*  CALCIUM 8.9 9.1 9.0  MG  --   --  2.2  PHOS  --   --  3.5   GFR: Estimated Creatinine Clearance: 39.8 mL/min (A) (by C-G formula based on SCr of 1.01 mg/dL (H)). Liver Function Tests: Recent Labs  Lab 06/23/19 0055 06/23/19 0634 06/24/19 0415  AST 42* 44* 64*  ALT 65* 67* 83*  ALKPHOS 74 74 66  BILITOT 0.7 0.7 0.8  PROT 7.2 7.2 7.0  ALBUMIN 3.3* 3.3* 3.3*   No results for input(s): LIPASE, AMYLASE in the last 168 hours. No results for input(s): AMMONIA in the last 168 hours. Coagulation Profile: No results for input(s): INR, PROTIME in the last 168 hours. Cardiac Enzymes: No results for input(s): CKTOTAL, CKMB, CKMBINDEX,  TROPONINI in the last 168 hours. BNP (last 3 results) No results for input(s): PROBNP in the last 8760 hours. HbA1C: No results for input(s): HGBA1C in the last 72 hours. CBG: Recent Labs  Lab 06/23/19 1200 06/23/19 1803 06/24/19 0754 06/24/19 1237  GLUCAP 150* 138* 106* 101*   Lipid Profile: No results for input(s): CHOL, HDL, LDLCALC, TRIG, CHOLHDL, LDLDIRECT in the last 72 hours. Thyroid Function Tests: No results for input(s): TSH, T4TOTAL, FREET4, T3FREE, THYROIDAB in the last 72 hours. Anemia Panel: Recent Labs    06/23/19 0055 06/24/19 0415  FERRITIN 311* 363*   Sepsis Labs: No results for input(s): PROCALCITON, LATICACIDVEN in the last 168 hours.  No results found for this or any previous visit (from the past 240 hour(s)).    Radiology Studies: DG Chest Port 1 View  Result Date: 06/22/2019 CLINICAL DATA:  COVID-19 positive, hypoxia EXAM: PORTABLE CHEST 1 VIEW COMPARISON:  03/26/2017 FINDINGS: Cardiomegaly. Calcified aortic knob. There is a well-defined ovoid density at the right lung base which may reflect focal eventration of the right hemidiaphragm. Coarsened interstitial markings bilaterally. No pleural effusion or pneumothorax. IMPRESSION: Well-defined ovoid density at the right lung base may reflect focal eventration of the right hemidiaphragm. Repeat PA and lateral chest radiographs could be confirmed to further evaluate this finding. Electronically Signed   By: 03/28/2017 M.D.   On: 06/22/2019 20:17   CT RENAL STONE STUDY  Result Date: 06/23/2019 CLINICAL DATA:  Flank pain. EXAM: CT ABDOMEN AND PELVIS WITHOUT CONTRAST TECHNIQUE: Multidetector CT imaging of the abdomen and pelvis was performed following the standard protocol without IV contrast. COMPARISON:  March 22, 2017. FINDINGS: Lower chest: No acute abnormality. Hepatobiliary: No focal liver abnormality is seen. No gallstones, gallbladder wall thickening, or biliary dilatation. Pancreas:  Unremarkable. No pancreatic ductal dilatation or surrounding inflammatory changes. Spleen: Normal in size without focal abnormality. Adrenals/Urinary Tract: Stable probable right adrenal adenoma. Left adrenal gland appears normal. No hydronephrosis or renal obstruction is noted. No hydronephrosis or renal obstruction is noted. No renal or ureteral calculi are noted. Urinary bladder is decompressed. 1.8 cm rounded low density is noted in midpole cortex of right kidney most consistent with cyst, but ultrasound is recommended for confirmation and to rule out mass. Stomach/Bowel: Small sliding-type hiatal hernia is noted. There is no evidence of bowel obstruction or inflammation. The appendix is not visualized. Vascular/Lymphatic: Aortic atherosclerosis. No enlarged abdominal or pelvic lymph nodes. Reproductive: Status post hysterectomy. No adnexal masses. Other: No abdominal wall hernia or abnormality. No abdominopelvic ascites. Musculoskeletal: No acute or significant osseous findings. IMPRESSION:  1. Aortic atherosclerosis. 2. Small sliding-type hiatal hernia. 3. Stable probable right adrenal adenoma. 4. 1.8 cm rounded low density is noted in midpole cortex of right kidney most consistent with cyst, but ultrasound is recommended for confirmation and to rule out mass. 5. No hydronephrosis or renal obstruction is noted. No renal or ureteral calculi are noted. Aortic Atherosclerosis (ICD10-I70.0). Electronically Signed   By: Marijo Conception M.D.   On: 06/23/2019 08:23     Scheduled Meds: . acyclovir  800 mg Oral BID  . amiodarone  200 mg Oral Daily  . amLODipine  5 mg Oral Daily  . apixaban  5 mg Oral BID  . cholecalciferol  5,000 Units Oral Daily  . dexamethasone (DECADRON) injection  6 mg Intravenous Q24H  . diltiazem  120 mg Oral Daily  . ferrous sulfate  325 mg Oral Q breakfast  . insulin aspart  0-9 Units Subcutaneous TID WC  . labetalol  100 mg Oral QHS  . labetalol  200 mg Oral Daily  .  lactobacillus acidophilus & bulgar  1 tablet Oral Daily  . magnesium chloride  1 tablet Oral Daily  . mirabegron ER  25 mg Oral Daily  . multivitamin  1 tablet Oral BID  . pantoprazole  40 mg Oral Daily  . polyethylene glycol  17 g Oral Daily  . polyvinyl alcohol  1 drop Both Eyes BID  . prednisoLONE acetate  1 drop Right Eye Daily  . timolol  1 drop Both Eyes BID  . vitamin B-12  1,000 mcg Oral Daily  . vitamin C  500 mg Oral BID   Continuous Infusions: . remdesivir 100 mg in NS 100 mL Stopped (06/24/19 1118)     LOS: 1 day    Time spent: 25 minutes spent in the coordination of care today.   Jonnie Finner, DO [facility] Pager [phone]  If 7PM-7AM, please contact night-coverage [email or web address] Password [email or web address] 06/24/2019, 2:06 PM

## 2019-06-24 NOTE — ED Notes (Signed)
Patient alert and oriented to person, pt thought the year was 2024 and thinks she is at [facility]. Pt calm and cooperative, able to be redirected.

## 2019-06-24 NOTE — ED Notes (Signed)
Pt currently in [place].

## 2019-06-24 NOTE — Progress Notes (Addendum)
Received report from Andrew,RN in the ED. 

## 2019-06-24 NOTE — ED Notes (Signed)
Patient had removed supplemental O2 during sleep, O2 sats remain 97-98% on room air. Pt assisted to bedside commode, nad upon returning to stretcher.

## 2019-06-25 LAB — CBC WITH DIFFERENTIAL/PLATELET
Abs Immature Granulocytes: 0.05 10*3/uL (ref 0.00–0.07)
Basophils Absolute: 0 10*3/uL (ref 0.0–0.1)
Basophils Relative: 0 %
Eosinophils Absolute: 0.1 10*3/uL (ref 0.0–0.5)
Eosinophils Relative: 1 %
HCT: 33.2 % — ABNORMAL LOW (ref 36.0–46.0)
Hemoglobin: 11 g/dL — ABNORMAL LOW (ref 12.0–15.0)
Immature Granulocytes: 1 %
Lymphocytes Relative: 17 %
Lymphs Abs: 1.2 10*3/uL (ref 0.7–4.0)
MCH: 32.7 pg (ref 26.0–34.0)
MCHC: 33.1 g/dL (ref 30.0–36.0)
MCV: 98.8 fL (ref 80.0–100.0)
Monocytes Absolute: 0.8 10*3/uL (ref 0.1–1.0)
Monocytes Relative: 12 %
Neutro Abs: 4.9 10*3/uL (ref 1.7–7.7)
Neutrophils Relative %: 69 %
Platelets: 334 10*3/uL (ref 150–400)
RBC: 3.36 MIL/uL — ABNORMAL LOW (ref 3.87–5.11)
RDW: 13.2 % (ref 11.5–15.5)
WBC: 7 10*3/uL (ref 4.0–10.5)
nRBC: 0 % (ref 0.0–0.2)

## 2019-06-25 LAB — PHOSPHORUS: Phosphorus: 3.2 mg/dL (ref 2.5–4.6)

## 2019-06-25 LAB — HEPATITIS PANEL, ACUTE
HCV Ab: NONREACTIVE
Hep A IgM: NONREACTIVE
Hep B C IgM: NONREACTIVE
Hepatitis B Surface Ag: NONREACTIVE

## 2019-06-25 LAB — COMPREHENSIVE METABOLIC PANEL
ALT: 96 U/L — ABNORMAL HIGH (ref 0–44)
AST: 64 U/L — ABNORMAL HIGH (ref 15–41)
Albumin: 2.8 g/dL — ABNORMAL LOW (ref 3.5–5.0)
Alkaline Phosphatase: 62 U/L (ref 38–126)
Anion gap: 10 (ref 5–15)
BUN: 19 mg/dL (ref 8–23)
CO2: 25 mmol/L (ref 22–32)
Calcium: 8.6 mg/dL — ABNORMAL LOW (ref 8.9–10.3)
Chloride: 104 mmol/L (ref 98–111)
Creatinine, Ser: 0.93 mg/dL (ref 0.44–1.00)
GFR calc Af Amer: >60 mL/min (ref >60)
GFR calc non Af Amer: 55 mL/min — ABNORMAL LOW (ref >60)
Glucose, Bld: 86 mg/dL (ref 70–99)
Potassium: 3.5 mmol/L (ref 3.5–5.1)
Sodium: 139 mmol/L (ref 135–145)
Total Bilirubin: 0.8 mg/dL (ref 0.3–1.2)
Total Protein: 6.1 g/dL — ABNORMAL LOW (ref 6.5–8.1)

## 2019-06-25 LAB — GLUCOSE, CAPILLARY
Glucose-Capillary: 90 mg/dL (ref 70–99)
Glucose-Capillary: 111 mg/dL — ABNORMAL HIGH (ref 70–99)
Glucose-Capillary: 130 mg/dL — ABNORMAL HIGH (ref 70–99)
Glucose-Capillary: 177 mg/dL — ABNORMAL HIGH (ref 70–99)
Glucose-Capillary: 114 mg/dL — ABNORMAL HIGH (ref 70–99)

## 2019-06-25 LAB — HEMOGLOBIN A1C
Hgb A1c MFr Bld: 5.9 % — ABNORMAL HIGH (ref 4.8–5.6)
Mean Plasma Glucose: 122.63 mg/dL

## 2019-06-25 LAB — D-DIMER, QUANTITATIVE: D-Dimer, Quant: 0.67 ug/mL-FEU — ABNORMAL HIGH (ref 0.00–0.50)

## 2019-06-25 LAB — FERRITIN: Ferritin: 355 ng/mL — ABNORMAL HIGH (ref 11–307)

## 2019-06-25 LAB — C-REACTIVE PROTEIN: CRP: 2.9 mg/dL — ABNORMAL HIGH (ref <1.0)

## 2019-06-25 LAB — MAGNESIUM: Magnesium: 1.8 mg/dL (ref 1.7–2.4)

## 2019-06-25 NOTE — Progress Notes (Signed)
[name]  PROGRESS NOTE    Lavina Resor  EXB:[id number] DOB: 1930-01-23 DOA: 06/22/2019 PCP: Reymundo Poll, MD   Brief Narrative:   Tracey Porter a 85 y.o.femalewithmemory issues with undiagnosed dementia, diabetes mellitus on diet, hypertension, paroxysmal atrial fibrillation recently had right hip fracture and is on rehab was found to be positive for Covid and but hypoxic and was transferred to the ER.Per patient's daughter patient also has been having some nausea last couple of days. Patient usually has very poor memory and that is at baseline as per the daughter and is not able to contribute much to the history.  06/24/19: No acute events ON. Denies complaints this AM. Respiratory status good. Inflammatory markers stable. Continue remdes/steroids. Scr trending up; get a little fluids today.  06/25/19: LFTs trendng up. Check TBili. Denies complaints.    Assessment & Plan:   Principal Problem:   Acute respiratory disease due to COVID-19 virus Active Problems:   Essential hypertension   PAF (paroxysmal atrial fibrillation) (HCC)   Acute respiratory failure with hypoxia (HCC)  Acute respiratory failure with hypoxia secondary to Covid pneumonia     - remdesivir (Day 3), decacron     - PRN robitussin, tussionex     - On 2L [place], wean  Nausea with mildly elevated LFTs     - denies nausea this AM     - CT renal stone study obtained showed no liver abnormalities     - hepatitis panel is negative     - PRN zofran  Paroxysmal atrial fibrillation     - apixaban, amiodarone, cardizem     - stable, monitor  Hypertension     - amlodipine, labetalol, cardizem     - BP is acceptable, monitor  Memory issues with possible dementia     - will need outpt follow up; must be outside of acute illness for dementia w/u  Diabetes mellitus type 2     - diet controlled     - A1c 5.9     - glucose ok  Recent right hip fracture status post surgery  DVT prophylaxis:  eliquis Code Status: FULL   Disposition Plan: TBD  Antimicrobials:  . Remdesivir   ROS:  Denies N, V, ab pain, dyspnea, CP. Reports cough . Remainder 10-pt ROS is negative for all not previously mentioned.  Subjective: "When can I go?"  Objective: Vitals:   06/25/19 0634 06/25/19 0638 06/25/19 0639 06/25/19 1200  BP:    (!) 117/57  Pulse: 66 67 68 67  Resp: 18 18 18 16   Temp:    97.7 F (36.5 C)  TempSrc:    Oral  SpO2: 94% 93% 92% 95%  Weight:      Height:        Intake/Output Summary (Last 24 hours) at 06/25/2019 1409 Last data filed at 06/25/2019 0900 Gross per 24 hour  Intake 835.83 ml  Output --  Net 835.83 ml   Filed Weights   06/22/19 1909  Weight: 71.6 kg    Examination:  General: 85 y.o. female resting in bed in NAD Cardiovascular: RRR, +S1, S2, no m/g/r, equal pulses throughout Respiratory: CTABL, no w/r/r, normal WOB GI: BS+, NDNT, no masses noted MSK: No e/c/c Neuro: alert to name, follows commands Psyc: calm/cooperative   Data Reviewed: I have personally reviewed following labs and imaging studies.  CBC: Recent Labs  Lab 06/23/19 0055 06/24/19 0500 06/25/19 0450  WBC 6.7 6.3 7.0  NEUTROABS 4.8 4.7 4.9  HGB 12.3 12.5  11.0*  HCT 36.9 37.3 33.2*  MCV 99.7 98.9 98.8  PLT 302 349 334   Basic Metabolic Panel: Recent Labs  Lab 06/23/19 0055 06/23/19 0634 06/24/19 0415 06/25/19 0450  NA 134* 135 135 139  K 3.6 3.6 4.1 3.5  CL 97* 97* 98 104  CO2 24 27 22 25   GLUCOSE 100* 96 124* 86  BUN 14 11 24* 19  CREATININE 0.66 0.89 1.01* 0.93  CALCIUM 8.9 9.1 9.0 8.6*  MG  --   --  2.2 1.8  PHOS  --   --  3.5 3.2   GFR: Estimated Creatinine Clearance: 43.2 mL/min (by C-G formula based on SCr of 0.93 mg/dL). Liver Function Tests: Recent Labs  Lab 06/23/19 0055 06/23/19 0634 06/24/19 0415 06/25/19 0450  AST 42* 44* 64* 64*  ALT 65* 67* 83* 96*  ALKPHOS 74 74 66 62  BILITOT 0.7 0.7 0.8 0.8  PROT 7.2 7.2 7.0 6.1*  ALBUMIN 3.3*  3.3* 3.3* 2.8*   No results for input(s): LIPASE, AMYLASE in the last 168 hours. No results for input(s): AMMONIA in the last 168 hours. Coagulation Profile: No results for input(s): INR, PROTIME in the last 168 hours. Cardiac Enzymes: No results for input(s): CKTOTAL, CKMB, CKMBINDEX, TROPONINI in the last 168 hours. BNP (last 3 results) No results for input(s): PROBNP in the last 8760 hours. HbA1C: Recent Labs    06/25/19 0500  HGBA1C 5.9*   CBG: Recent Labs  Lab 06/24/19 1748 06/24/19 2228 06/25/19 0301 06/25/19 0742 06/25/19 1134  GLUCAP 88 79 90 111* 130*   Lipid Profile: No results for input(s): CHOL, HDL, LDLCALC, TRIG, CHOLHDL, LDLDIRECT in the last 72 hours. Thyroid Function Tests: No results for input(s): TSH, T4TOTAL, FREET4, T3FREE, THYROIDAB in the last 72 hours. Anemia Panel: Recent Labs    06/24/19 0415 06/25/19 0450  FERRITIN 363* 355*   Sepsis Labs: No results for input(s): PROCALCITON, LATICACIDVEN in the last 168 hours.  No results found for this or any previous visit (from the past 240 hour(s)).    Radiology Studies: CT ANGIO CHEST PE W OR WO CONTRAST  Result Date: 06/24/2019 CLINICAL DATA:  Shortness of breath, elevated D-dimer EXAM: CT ANGIOGRAPHY CHEST WITH CONTRAST TECHNIQUE: Multidetector CT imaging of the chest was performed using the standard protocol during bolus administration of intravenous contrast. Multiplanar CT image reconstructions and MIPs were obtained to evaluate the vascular anatomy. CONTRAST:  75mL OMNIPAQUE IOHEXOL 350 MG/ML SOLN COMPARISON:  None. FINDINGS: Cardiovascular: Satisfactory opacification of the pulmonary arteries to the segmental level. No evidence of pulmonary embolism. Mild cardiomegaly. No pericardial effusion. Aortic atherosclerosis. Mediastinum/Nodes: No enlarged mediastinal, hilar, or axillary lymph nodes. Thyroid gland, trachea, and esophagus demonstrate no significant findings. Lungs/Pleura: Scarring and/or  partial atelectasis of the lower lungs. No pleural effusion or pneumothorax. Upper Abdomen: No acute abnormality. Partially imaged, fat containing benign right adrenal adenoma. Musculoskeletal: No chest wall abnormality. No acute or significant osseous findings. Disc degenerative disease and ankylosis of the thoracic spine. Review of the MIP images confirms the above findings. IMPRESSION: 1. Negative examination for pulmonary embolism. 2. Scarring and/or partial atelectasis of the lower lungs. 3. Cardiomegaly 4.  Aortic Atherosclerosis (ICD10-I70.0). Electronically Signed   By: Lauralyn PrimesAlex  Bibbey M.D.   On: 06/24/2019 16:51     Scheduled Meds: . acyclovir  800 mg Oral BID  . amiodarone  200 mg Oral Daily  . amLODipine  5 mg Oral Daily  . apixaban  5 mg Oral BID  .  cholecalciferol  5,000 Units Oral Daily  . dexamethasone (DECADRON) injection  6 mg Intravenous Q24H  . diltiazem  120 mg Oral Daily  . ferrous sulfate  325 mg Oral Q breakfast  . insulin aspart  0-9 Units Subcutaneous TID WC  . labetalol  100 mg Oral QHS  . labetalol  200 mg Oral Daily  . lactobacillus acidophilus & bulgar  1 tablet Oral Daily  . magnesium chloride  1 tablet Oral Daily  . mirabegron ER  25 mg Oral Daily  . multivitamin  1 tablet Oral BID  . pantoprazole  40 mg Oral Daily  . polyethylene glycol  17 g Oral Daily  . polyvinyl alcohol  1 drop Both Eyes BID  . prednisoLONE acetate  1 drop Right Eye Daily  . timolol  1 drop Both Eyes BID  . vitamin B-12  1,000 mcg Oral Daily  . vitamin C  500 mg Oral BID   Continuous Infusions: . remdesivir 100 mg in NS 100 mL 100 mg (06/25/19 0927)     LOS: 2 days    Time spent: 25 minutes spent in the coordination of care today.    Teddy Spike, DO [facility] Pager [phone]  If 7PM-7AM, please contact night-coverage [email or web address] Password [email or web address] 06/25/2019, 2:09 PM

## 2019-06-25 NOTE — Progress Notes (Signed)
Upon arrival from the ED, patient had order for restraints. Patient did not have restraints on arrival to the unit. Restraints not applied as patient is able to follow commands and is alert. Will continue to monitor and treat per MD orders.

## 2019-06-25 NOTE — Progress Notes (Addendum)
Patient transferred from ED to 5W01. Patient A&O self. Telemetry applied and second verified by [employer]. Skin assessment completed by this RN and Chanisty M.,RN. Patient instructed how to use the callbell before getting out of bed. Callbell within reach. Vital signs stable with BP of 145/71, HR 73, respirations 16, temperature of 98.6, and 100% on 2 liters. Patient running normal sinus rhythm on the monitor. Will continue to monitor and treat per MD orders.

## 2019-06-26 ENCOUNTER — Other Ambulatory Visit: Payer: Self-pay

## 2019-06-26 LAB — CBC WITH DIFFERENTIAL/PLATELET
Abs Immature Granulocytes: 0.05 10*3/uL (ref 0.00–0.07)
Basophils Absolute: 0 10*3/uL (ref 0.0–0.1)
Basophils Relative: 0 %
Eosinophils Absolute: 0 10*3/uL (ref 0.0–0.5)
Eosinophils Relative: 0 %
HCT: 35.1 % — ABNORMAL LOW (ref 36.0–46.0)
Hemoglobin: 11.9 g/dL — ABNORMAL LOW (ref 12.0–15.0)
Immature Granulocytes: 1 %
Lymphocytes Relative: 11 %
Lymphs Abs: 0.8 10*3/uL (ref 0.7–4.0)
MCH: 33.1 pg (ref 26.0–34.0)
MCHC: 33.9 g/dL (ref 30.0–36.0)
MCV: 97.5 fL (ref 80.0–100.0)
Monocytes Absolute: 0.8 10*3/uL (ref 0.1–1.0)
Monocytes Relative: 11 %
Neutro Abs: 5.4 10*3/uL (ref 1.7–7.7)
Neutrophils Relative %: 77 %
Platelets: 359 10*3/uL (ref 150–400)
RBC: 3.6 MIL/uL — ABNORMAL LOW (ref 3.87–5.11)
RDW: 12.9 % (ref 11.5–15.5)
WBC: 7 10*3/uL (ref 4.0–10.5)
nRBC: 0 % (ref 0.0–0.2)

## 2019-06-26 LAB — COMPREHENSIVE METABOLIC PANEL
ALT: 90 U/L — ABNORMAL HIGH (ref 0–44)
AST: 56 U/L — ABNORMAL HIGH (ref 15–41)
Albumin: 3.1 g/dL — ABNORMAL LOW (ref 3.5–5.0)
Alkaline Phosphatase: 66 U/L (ref 38–126)
Anion gap: 12 (ref 5–15)
BUN: 14 mg/dL (ref 8–23)
CO2: 24 mmol/L (ref 22–32)
Calcium: 8.8 mg/dL — ABNORMAL LOW (ref 8.9–10.3)
Chloride: 99 mmol/L (ref 98–111)
Creatinine, Ser: 0.55 mg/dL (ref 0.44–1.00)
GFR calc Af Amer: >60 mL/min (ref >60)
GFR calc non Af Amer: >60 mL/min (ref >60)
Glucose, Bld: 117 mg/dL — ABNORMAL HIGH (ref 70–99)
Potassium: 4 mmol/L (ref 3.5–5.1)
Sodium: 135 mmol/L (ref 135–145)
Total Bilirubin: 0.8 mg/dL (ref 0.3–1.2)
Total Protein: 6.5 g/dL (ref 6.5–8.1)

## 2019-06-26 LAB — GLUCOSE, CAPILLARY
Glucose-Capillary: 140 mg/dL — ABNORMAL HIGH (ref 70–99)
Glucose-Capillary: 152 mg/dL — ABNORMAL HIGH (ref 70–99)
Glucose-Capillary: 131 mg/dL — ABNORMAL HIGH (ref 70–99)
Glucose-Capillary: 140 mg/dL — ABNORMAL HIGH (ref 70–99)

## 2019-06-26 LAB — MAGNESIUM: Magnesium: 1.8 mg/dL (ref 1.7–2.4)

## 2019-06-26 LAB — C-REACTIVE PROTEIN: CRP: 3.2 mg/dL — ABNORMAL HIGH (ref <1.0)

## 2019-06-26 LAB — MRSA PCR SCREENING: MRSA by PCR: POSITIVE — AB

## 2019-06-26 LAB — FERRITIN: Ferritin: 325 ng/mL — ABNORMAL HIGH (ref 11–307)

## 2019-06-26 LAB — D-DIMER, QUANTITATIVE: D-Dimer, Quant: 0.9 ug/mL-FEU — ABNORMAL HIGH (ref 0.00–0.50)

## 2019-06-26 LAB — PHOSPHORUS: Phosphorus: 2.4 mg/dL — ABNORMAL LOW (ref 2.5–4.6)

## 2019-06-26 MED ORDER — MUPIROCIN 2 % EX OINT
1.0000 "application " | TOPICAL_OINTMENT | Freq: Two times a day (BID) | CUTANEOUS | Status: DC
  Administered 2019-06-26 – 2019-06-29 (×7): 1 via NASAL
  Filled 2019-06-26: qty 22

## 2019-06-26 MED ORDER — CHLORHEXIDINE GLUCONATE CLOTH 2 % EX PADS
6.0000 | MEDICATED_PAD | Freq: Every day | CUTANEOUS | Status: DC
  Administered 2019-06-26 – 2019-06-29 (×3): 6 via TOPICAL

## 2019-06-26 MED ORDER — NON FORMULARY: 3.0000 mg | Freq: Every day | Status: DC

## 2019-06-26 MED ORDER — MELATONIN 3 MG PO TABS
3.0000 mg | ORAL_TABLET | Freq: Every day | ORAL | Status: DC
  Administered 2019-06-26 – 2019-06-28 (×3): 3 mg via ORAL
  Filled 2019-06-26 (×4): qty 1

## 2019-06-26 NOTE — Progress Notes (Signed)
[name].  PROGRESS NOTE    Tracey Porter  ZOX:[id number] DOB: 07/16/1929 DOA: 06/22/2019 PCP: [name], Henry, MD   Brief Narrative:   Tracey LifeConstance Porter a 85 y.o.femalewithmemory issues with undiagnosed dementia, diabetes mellitus on diet, hypertension, paroxysmal atrial fibrillation recently had right hip fracture and is on rehab was found to be positive for Covid and but hypoxic and was transferred to the ER.Per patient's daughter patient also has been having some nausea last couple of days. Patient usually has very poor memory and that is at baseline as per the daughter and is not able to contribute much to the history.  06/24/19: No acute events ON. Denies complaints this AM. Respiratory status good. Inflammatory markers stable. Continue remdes/steroids. Scr trending up; get a little fluids today. 06/25/19: LFTs trendng up. Check TBili. Denies complaints.    Assessment & Plan:   Principal Problem:   Acute respiratory disease due to COVID-19 virus Active Problems:   Essential hypertension   PAF (paroxysmal atrial fibrillation) (HCC)   Acute respiratory failure with hypoxia (HCC)  Acute respiratory failure with hypoxia secondary to Covid pneumonia - remdesivir (Day 4), decacron - PRN robitussin, tussionex     - On RA, doing well  Nausea with mildly elevated LFTs - denies nausea this AM - CT renal stone study obtained showed no liver abnormalities - hepatitis panel is negative - PRN zofran     - stable, monitor  Paroxysmal atrial fibrillation - apixaban, amiodarone, cardizem - continue as above  Hypertension - amlodipine, labetalol, cardizem     - BP is fine; monitor  Memory issues with possible dementia - will need outpt follow up; must be outside of acute illness for dementia w/u  Diabetes mellitus type 2 - diet controlled - A1c 5.9     - glucose looks good today  Recent right hip fracture status post  surgery     - PT/OT eval  DVT prophylaxis: eliquis Code Status: FULL Family Communication: Spoke with dtr by phone   Disposition Plan: TBD  Antimicrobials:  . Remdesivir   Subjective: "Are there any side effects?"  Objective: Vitals:   06/25/19 2036 06/25/19 2115 06/26/19 0445 06/26/19 0504  BP: 125/61  (!) 148/69 (!) 135/50  Pulse: 66 69 77 75  Resp: 18 16 18 15   Temp:   98.2 F (36.8 C) 98.2 F (36.8 C)  TempSrc:   Oral Oral  SpO2: 92% 93% 97% 93%  Weight:      Height:        Intake/Output Summary (Last 24 hours) at 06/26/2019 1331 Last data filed at 06/26/2019 0500 Gross per 24 hour  Intake 360 ml  Output --  Net 360 ml   Filed Weights   06/22/19 1909  Weight: 71.6 kg    Examination:  General: 85 y.o. female resting in bed in NAD Cardiovascular: RRR, +s1s2, no murmurs Respiratory: CTABL, no w/r/r GI: BS+, NDNT, no masses noted, no organomegaly noted MSK: No e/c/c Neuro: Alert to name, follows commands Psyc: Appropriate interaction and affect, calm/cooperative   Data Reviewed: I have personally reviewed following labs and imaging studies.  CBC: Recent Labs  Lab 06/23/19 0055 06/24/19 0500 06/25/19 0450 06/26/19 0535  WBC 6.7 6.3 7.0 7.0  NEUTROABS 4.8 4.7 4.9 5.4  HGB 12.3 12.5 11.0* 11.9*  HCT 36.9 37.3 33.2* 35.1*  MCV 99.7 98.9 98.8 97.5  PLT 302 349 334 359   Basic Metabolic Panel: Recent Labs  Lab 06/23/19 0055 06/23/19 0634 06/24/19 0415 06/25/19 0450 06/26/19  0428  NA 134* 135 135 139 135  K 3.6 3.6 4.1 3.5 4.0  CL 97* 97* 98 104 99  CO2 24 27 22 25 24   GLUCOSE 100* 96 124* 86 117*  BUN 14 11 24* 19 14  CREATININE 0.66 0.89 1.01* 0.93 0.55  CALCIUM 8.9 9.1 9.0 8.6* 8.8*  MG  --   --  2.2 1.8 1.8  PHOS  --   --  3.5 3.2 2.4*   GFR: Estimated Creatinine Clearance: 50.2 mL/min (by C-G formula based on SCr of 0.55 mg/dL). Liver Function Tests: Recent Labs  Lab 06/23/19 0055 06/23/19 0634 06/24/19 0415 06/25/19 0450  06/26/19 0428  AST 42* 44* 64* 64* 56*  ALT 65* 67* 83* 96* 90*  ALKPHOS 74 74 66 62 66  BILITOT 0.7 0.7 0.8 0.8 0.8  PROT 7.2 7.2 7.0 6.1* 6.5  ALBUMIN 3.3* 3.3* 3.3* 2.8* 3.1*   No results for input(s): LIPASE, AMYLASE in the last 168 hours. No results for input(s): AMMONIA in the last 168 hours. Coagulation Profile: No results for input(s): INR, PROTIME in the last 168 hours. Cardiac Enzymes: No results for input(s): CKTOTAL, CKMB, CKMBINDEX, TROPONINI in the last 168 hours. BNP (last 3 results) No results for input(s): PROBNP in the last 8760 hours. HbA1C: Recent Labs    06/25/19 0500  HGBA1C 5.9*   CBG: Recent Labs  Lab 06/25/19 1134 06/25/19 1642 06/25/19 2122 06/26/19 0749 06/26/19 1139  GLUCAP 130* 177* 114* 140* 152*   Lipid Profile: No results for input(s): CHOL, HDL, LDLCALC, TRIG, CHOLHDL, LDLDIRECT in the last 72 hours. Thyroid Function Tests: No results for input(s): TSH, T4TOTAL, FREET4, T3FREE, THYROIDAB in the last 72 hours. Anemia Panel: Recent Labs    06/25/19 0450 06/26/19 0535  FERRITIN 355* 325*   Sepsis Labs: No results for input(s): PROCALCITON, LATICACIDVEN in the last 168 hours.  Recent Results (from the past 240 hour(s))  MRSA PCR Screening     Status: Abnormal   Collection Time: 06/26/19  5:01 AM   Specimen: Nasal Mucosa; Nasopharyngeal  Result Value Ref Range Status   MRSA by PCR POSITIVE (A) NEGATIVE Final    Comment:        The GeneXpert MRSA Assay (FDA approved for NASAL specimens only), is one component of a comprehensive MRSA colonization surveillance program. It is not intended to diagnose MRSA infection nor to guide or monitor treatment for MRSA infections. RESULT CALLED TO, READ BACK BY AND VERIFIED WITH: RN 06/28/19 [phone] FCP Performed at [facility], [address], [place], [place]       Radiology Studies: CT ANGIO CHEST PE W OR WO CONTRAST  Result Date: 06/24/2019 CLINICAL DATA:   Shortness of breath, elevated D-dimer EXAM: CT ANGIOGRAPHY CHEST WITH CONTRAST TECHNIQUE: Multidetector CT imaging of the chest was performed using the standard protocol during bolus administration of intravenous contrast. Multiplanar CT image reconstructions and MIPs were obtained to evaluate the vascular anatomy. CONTRAST:  39mL OMNIPAQUE IOHEXOL 350 MG/ML SOLN COMPARISON:  None. FINDINGS: Cardiovascular: Satisfactory opacification of the pulmonary arteries to the segmental level. No evidence of pulmonary embolism. Mild cardiomegaly. No pericardial effusion. Aortic atherosclerosis. Mediastinum/Nodes: No enlarged mediastinal, hilar, or axillary lymph nodes. Thyroid gland, trachea, and esophagus demonstrate no significant findings. Lungs/Pleura: Scarring and/or partial atelectasis of the lower lungs. No pleural effusion or pneumothorax. Upper Abdomen: No acute abnormality. Partially imaged, fat containing benign right adrenal adenoma. Musculoskeletal: No chest wall abnormality. No acute or  significant osseous findings. Disc degenerative disease and ankylosis of the thoracic spine. Review of the MIP images confirms the above findings. IMPRESSION: 1. Negative examination for pulmonary embolism. 2. Scarring and/or partial atelectasis of the lower lungs. 3. Cardiomegaly 4.  Aortic Atherosclerosis (ICD10-I70.0). Electronically Signed   By: Eddie Candle M.D.   On: 06/24/2019 16:51     Scheduled Meds: . acyclovir  800 mg Oral BID  . amiodarone  200 mg Oral Daily  . amLODipine  5 mg Oral Daily  . apixaban  5 mg Oral BID  . Chlorhexidine Gluconate Cloth  6 each Topical Q0600  . cholecalciferol  5,000 Units Oral Daily  . dexamethasone (DECADRON) injection  6 mg Intravenous Q24H  . diltiazem  120 mg Oral Daily  . ferrous sulfate  325 mg Oral Q breakfast  . insulin aspart  0-9 Units Subcutaneous TID WC  . labetalol  100 mg Oral QHS  . labetalol  200 mg Oral Daily  . lactobacillus acidophilus & bulgar  1 tablet  Oral Daily  . magnesium chloride  1 tablet Oral Daily  . mirabegron ER  25 mg Oral Daily  . multivitamin  1 tablet Oral BID  . mupirocin ointment  1 application Nasal BID  . NON FORMULARY 3 mg  3 mg Oral QHS  . pantoprazole  40 mg Oral Daily  . polyethylene glycol  17 g Oral Daily  . polyvinyl alcohol  1 drop Both Eyes BID  . prednisoLONE acetate  1 drop Right Eye Daily  . timolol  1 drop Both Eyes BID  . vitamin B-12  1,000 mcg Oral Daily  . vitamin C  500 mg Oral BID   Continuous Infusions: . remdesivir 100 mg in NS 100 mL 100 mg (06/26/19 0847)     LOS: 3 days    Time spent: 25 minutes spent in the coordination of care today.    Jonnie Finner, DO [facility] Pager [phone]  If 7PM-7AM, please contact night-coverage [email or web address] Password [email or web address] 06/26/2019, 1:31 PM

## 2019-06-26 NOTE — TOC Initial Note (Signed)
Transition of Care [facility]) - Initial/Assessment Note    Patient Details  Name: Tracey Porter MRN: [id number] Date of Birth: 01-11-1930  [facility] CM/SW Contact:    Cherylann Parr, RN Phone Number: 06/26/2019, 9:40 AM  Clinical Narrative:      PTA from [facility].  Pt has baseline dementia and memory impairments - CM contacted pt's daughter Victorino Dike by phone.  Plan will be for pt to return to facility, pt is a level 2 and currently receives services in facility to assist with ADL and medication assistance. CM also spoke with Iman the Admissions Coordinator at the facility ; facility can accept pt back being covid positive. Pt was ambulatory PTA with a walker.  Facility request PT eval while hospitalized - CM requested from attending.  Facility also request that pts FL2 be completed on day of discharge and faxed to facility along with d/c summary for review and official acceptance before arrangements are made for pt to transport back to facility.  .               Expected Discharge Plan: Assisted Living Barriers to Discharge: Continued Medical Work up   Patient Goals and CMS Choice        Expected Discharge Plan and Services Expected Discharge Plan: Assisted Living     Post Acute Care Choice: (Back to Assisted Living Facility [facility]) Living arrangements for the past 2 months: Assisted Living Facility                                      Prior Living Arrangements/Services Living arrangements for the past 2 months: Assisted Living Facility Lives with:: Self Patient language and need for interpreter reviewed:: Yes        Need for Family Participation in Patient Care: Yes (Comment) Care giver support system in place?: Yes (comment) Current home services: DME(walker) Criminal Activity/Legal Involvement Pertinent to Current Situation/Hospitalization: No - Comment as needed  Activities of Daily Living   ADL Screening (condition at time  of admission) Patient's cognitive ability adequate to safely complete daily activities?: No Is the patient deaf or have difficulty hearing?: No Does the patient have difficulty seeing, even when wearing glasses/contacts?: No Does the patient have difficulty concentrating, remembering, or making decisions?: Yes Patient able to express need for assistance with ADLs?: Yes Does the patient have difficulty dressing or bathing?: Yes Independently performs ADLs?: No Does the patient have difficulty walking or climbing stairs?: Yes Weakness of Legs: Both Weakness of Arms/Hands: None  Permission Sought/Granted   Permission granted to share information with : Yes, Verbal Permission Granted(Pt has baseline dementia and memory impairment - CM spoke with daughter)     Permission granted to share info w AGENCY: [facility]        Emotional Assessment       Orientation: : Fluctuating Orientation (Suspected and/or reported [employer])      Admission diagnosis:  Acute respiratory failure (HCC) [J96.00] Renal cyst [N28.1] Acute respiratory failure with hypoxia (HCC) [J96.01] COVID-19 [U07.1] Patient Active Problem List   Diagnosis Date Noted  . Acute respiratory disease due to COVID-19 virus 06/23/2019  . Acute respiratory failure with hypoxia (HCC) 06/23/2019  . Displaced fracture of right femoral neck (HCC) 04/30/2018  . Chronic back pain 10/10/2017  . Dyslipidemia 10/10/2017  . PAF (paroxysmal atrial fibrillation) (HCC) 07/28/2017  . Orthostatic hypotension 07/28/2017  .  History of GI bleed 07/28/2017  . Chronic diastolic heart failure ([place])   . Multiple fractures of pelvis with unstable disruption of pelvic ring, initial encounter for closed fracture ([place]) 03/20/2017  . Atrial fibrillation with RVR ([place]) 03/20/2017  . Aortic atherosclerosis ([place]) 03/20/2017  . Hypotension due to blood loss   . Chronic anticoagulation   . Closed pelvic fracture ([place]) 03/19/2017  . Leukocytosis  03/19/2017  . Fall at home, initial encounter 03/19/2017  . Paroxysmal SVT (supraventricular tachycardia) ([place])   . Essential hypertension   . Non-insulin treated type 2 diabetes mellitus ([place]) 03/20/2015  . Hypokalemia 03/19/2015   PCP:  Reymundo Poll, MD Pharmacy:   [facility], [place] [address] Phone: [phone] Fax: [phone]     Social Determinants of Health (SDOH) Interventions    Readmission Risk Interventions No flowsheet data found.

## 2019-06-27 ENCOUNTER — Encounter (HOSPITAL_COMMUNITY): Payer: Self-pay | Admitting: Internal Medicine

## 2019-06-27 LAB — CBC WITH DIFFERENTIAL/PLATELET
Abs Immature Granulocytes: 0.09 10*3/uL — ABNORMAL HIGH (ref 0.00–0.07)
Basophils Absolute: 0 10*3/uL (ref 0.0–0.1)
Basophils Relative: 0 %
Eosinophils Absolute: 0 10*3/uL (ref 0.0–0.5)
Eosinophils Relative: 0 %
HCT: 35.5 % — ABNORMAL LOW (ref 36.0–46.0)
Hemoglobin: 12 g/dL (ref 12.0–15.0)
Immature Granulocytes: 1 %
Lymphocytes Relative: 7 %
Lymphs Abs: 0.8 10*3/uL (ref 0.7–4.0)
MCH: 32.7 pg (ref 26.0–34.0)
MCHC: 33.8 g/dL (ref 30.0–36.0)
MCV: 96.7 fL (ref 80.0–100.0)
Monocytes Absolute: 0.8 10*3/uL (ref 0.1–1.0)
Monocytes Relative: 8 %
Neutro Abs: 8.7 10*3/uL — ABNORMAL HIGH (ref 1.7–7.7)
Neutrophils Relative %: 84 %
Platelets: 378 10*3/uL (ref 150–400)
RBC: 3.67 MIL/uL — ABNORMAL LOW (ref 3.87–5.11)
RDW: 12.9 % (ref 11.5–15.5)
WBC: 10.4 10*3/uL (ref 4.0–10.5)
nRBC: 0 % (ref 0.0–0.2)

## 2019-06-27 LAB — GLUCOSE, CAPILLARY
Glucose-Capillary: 121 mg/dL — ABNORMAL HIGH (ref 70–99)
Glucose-Capillary: 182 mg/dL — ABNORMAL HIGH (ref 70–99)
Glucose-Capillary: 175 mg/dL — ABNORMAL HIGH (ref 70–99)
Glucose-Capillary: 138 mg/dL — ABNORMAL HIGH (ref 70–99)

## 2019-06-27 LAB — COMPREHENSIVE METABOLIC PANEL
ALT: 94 U/L — ABNORMAL HIGH (ref 0–44)
AST: 44 U/L — ABNORMAL HIGH (ref 15–41)
Albumin: 3.1 g/dL — ABNORMAL LOW (ref 3.5–5.0)
Alkaline Phosphatase: 64 U/L (ref 38–126)
Anion gap: 11 (ref 5–15)
BUN: 17 mg/dL (ref 8–23)
CO2: 28 mmol/L (ref 22–32)
Calcium: 9.2 mg/dL (ref 8.9–10.3)
Chloride: 99 mmol/L (ref 98–111)
Creatinine, Ser: 0.69 mg/dL (ref 0.44–1.00)
GFR calc Af Amer: >60 mL/min (ref >60)
GFR calc non Af Amer: >60 mL/min (ref >60)
Glucose, Bld: 121 mg/dL — ABNORMAL HIGH (ref 70–99)
Potassium: 3.6 mmol/L (ref 3.5–5.1)
Sodium: 138 mmol/L (ref 135–145)
Total Bilirubin: 0.6 mg/dL (ref 0.3–1.2)
Total Protein: 6.9 g/dL (ref 6.5–8.1)

## 2019-06-27 LAB — PHOSPHORUS: Phosphorus: 3.4 mg/dL (ref 2.5–4.6)

## 2019-06-27 LAB — MAGNESIUM: Magnesium: 1.9 mg/dL (ref 1.7–2.4)

## 2019-06-27 LAB — FERRITIN: Ferritin: 308 ng/mL — ABNORMAL HIGH (ref 11–307)

## 2019-06-27 LAB — D-DIMER, QUANTITATIVE: D-Dimer, Quant: 0.68 ug/mL-FEU — ABNORMAL HIGH (ref 0.00–0.50)

## 2019-06-27 LAB — C-REACTIVE PROTEIN: CRP: 1.7 mg/dL — ABNORMAL HIGH (ref <1.0)

## 2019-06-27 NOTE — Progress Notes (Signed)
Progress Note    Tracey Porter  ZOX:[id number] DOB: 1930-04-28  DOA: 06/22/2019 PCP: Florentina Jenny, MD      Brief Narrative:    Medical records reviewed and are as summarized below:  Tracey Porter is an 85 y.o. female with memory issues with undiagnosed dementia, diabetes mellitus on diet, hypertension, paroxysmal atrial fibrillation recently right hip fracture who was in rehabwas found to be positive for Covid 19 with hypoxemia.  She was subsequently transferred to the emergency room for further management.        Assessment/Plan:   Principal Problem:   Acute respiratory disease due to COVID-19 virus Active Problems:   Essential hypertension   PAF (paroxysmal atrial fibrillation) (HCC)   Acute respiratory failure with hypoxia (HCC)   Body mass index is 25.48 kg/m.    COVID-19 pneumonia: Completed IV remdesivir on 06/27/2019.  Continue IV dexamethasone.  Robitussin as needed for cough.  Acute hypoxemic respiratory failure: Resolved.  Tolerating room air.  Paroxysmal atrial fibrillation: Continue Eliquis, amiodarone and Cardizem  Mildly elevated liver enzymes: Hepatitis panel was negative.  No liver abnormality seen on CT scan.  Suspected right kidney cysts noted on CT scan: Renal ultrasound for further evaluation.  Hypertension: Continue antihypertensives  Memory impairment with possible underlying dementia: supportive care.  Outpatient follow-up  Recent right hip fracture status post surgery: PT and OT.   Family Communication/Anticipated D/C date and plan/Code Status   DVT prophylaxis: Eliquis Code Status: Full code Family Communication: Discussed with the patient Disposition Plan: Likely discharge to SNF within 3 to 4 days      Subjective:   No complaints.  He feels better.  Objective:    Vitals:   06/27/19 0400 06/27/19 0757 06/27/19 0918 06/27/19 0919  BP: (!) 123/56 123/67 (!) 114/55   Pulse: 65 69  70  Resp: 15 19    Temp:   98.8 F (37.1 C)    TempSrc:  Oral    SpO2: 93% 94%    Weight:      Height:       No intake or output data in the 24 hours ending 06/27/19 1043 Filed Weights   06/22/19 1909  Weight: 71.6 kg    Exam:  GEN: NAD SKIN: No rash EYES: anicteric ENT: MMM CV: RRR PULM: no wheezing ABD: soft, ND, NT, +BS CNS: AAO x 3, non focal EXT: No edema or tenderness   Data Reviewed:   I have personally reviewed following labs and imaging studies:  Labs: Labs show the following:   Basic Metabolic Panel: Recent Labs  Lab 06/23/19 0634 06/24/19 0415 06/25/19 0450 06/26/19 0428 06/27/19 0358  NA 135 135 139 135 138  K 3.6 4.1 3.5 4.0 3.6  CL 97* 98 104 99 99  CO2 27 22 25 24 28   GLUCOSE 96 124* 86 117* 121*  BUN 11 24* 19 14 17   CREATININE 0.89 1.01* 0.93 0.55 0.69  CALCIUM 9.1 9.0 8.6* 8.8* 9.2  MG  --  2.2 1.8 1.8 1.9  PHOS  --  3.5 3.2 2.4* 3.4   GFR Estimated Creatinine Clearance: 50.2 mL/min (by C-G formula based on SCr of 0.69 mg/dL). Liver Function Tests: Recent Labs  Lab 06/23/19 0634 06/24/19 0415 06/25/19 0450 06/26/19 0428 06/27/19 0358  AST 44* 64* 64* 56* 44*  ALT 67* 83* 96* 90* 94*  ALKPHOS 74 66 62 66 64  BILITOT 0.7 0.8 0.8 0.8 0.6  PROT 7.2 7.0 6.1* 6.5 6.9  ALBUMIN  3.3* 3.3* 2.8* 3.1* 3.1*   No results for input(s): LIPASE, AMYLASE in the last 168 hours. No results for input(s): AMMONIA in the last 168 hours. Coagulation profile No results for input(s): INR, PROTIME in the last 168 hours.  CBC: Recent Labs  Lab 06/23/19 0055 06/24/19 0500 06/25/19 0450 06/26/19 0535 06/27/19 0358  WBC 6.7 6.3 7.0 7.0 10.4  NEUTROABS 4.8 4.7 4.9 5.4 8.7*  HGB 12.3 12.5 11.0* 11.9* 12.0  HCT 36.9 37.3 33.2* 35.1* 35.5*  MCV 99.7 98.9 98.8 97.5 96.7  PLT 302 349 334 359 378   Cardiac Enzymes: No results for input(s): CKTOTAL, CKMB, CKMBINDEX, TROPONINI in the last 168 hours. BNP (last 3 results) No results for input(s): PROBNP in the last 8760  hours. CBG: Recent Labs  Lab 06/26/19 0749 06/26/19 1139 06/26/19 1704 06/26/19 2136 06/27/19 0745  GLUCAP 140* 152* 131* 140* 121*   D-Dimer: Recent Labs    06/26/19 0600 06/27/19 0358  DDIMER 0.90* 0.68*   Hgb A1c: Recent Labs    06/25/19 0500  HGBA1C 5.9*   Lipid Profile: No results for input(s): CHOL, HDL, LDLCALC, TRIG, CHOLHDL, LDLDIRECT in the last 72 hours. Thyroid function studies: No results for input(s): TSH, T4TOTAL, T3FREE, THYROIDAB in the last 72 hours.  Invalid input(s): FREET3 Anemia work up: Recent Labs    06/26/19 0535 06/27/19 0358  FERRITIN 325* 308*   Sepsis Labs: Recent Labs  Lab 06/24/19 0500 06/25/19 0450 06/26/19 0535 06/27/19 0358  WBC 6.3 7.0 7.0 10.4    Microbiology Recent Results (from the past 240 hour(s))  MRSA PCR Screening     Status: Abnormal   Collection Time: 06/26/19  5:01 AM   Specimen: Nasal Mucosa; Nasopharyngeal  Result Value Ref Range Status   MRSA by PCR POSITIVE (A) NEGATIVE Final    Comment:        The GeneXpert MRSA Assay (FDA approved for NASAL specimens only), is one component of a comprehensive MRSA colonization surveillance program. It is not intended to diagnose MRSA infection nor to guide or monitor treatment for MRSA infections. RESULT CALLED TO, READ BACK BY AND VERIFIED WITH: RN Rex Kras [phone] [id number] FCP Performed at [facility] [address], [place], [place] [address]     Procedures and diagnostic studies:  No results found.  Medications:   . acyclovir  800 mg Oral BID  . amiodarone  200 mg Oral Daily  . amLODipine  5 mg Oral Daily  . apixaban  5 mg Oral BID  . Chlorhexidine Gluconate Cloth  6 each Topical Q0600  . cholecalciferol  5,000 Units Oral Daily  . dexamethasone (DECADRON) injection  6 mg Intravenous Q24H  . diltiazem  120 mg Oral Daily  . ferrous sulfate  325 mg Oral Q breakfast  . insulin aspart  0-9 Units Subcutaneous TID WC  . labetalol  100 mg Oral QHS    . labetalol  200 mg Oral Daily  . lactobacillus acidophilus & bulgar  1 tablet Oral Daily  . magnesium chloride  1 tablet Oral Daily  . Melatonin  3 mg Oral QHS  . mirabegron ER  25 mg Oral Daily  . multivitamin  1 tablet Oral BID  . mupirocin ointment  1 application Nasal BID  . pantoprazole  40 mg Oral Daily  . polyethylene glycol  17 g Oral Daily  . polyvinyl alcohol  1 drop Both Eyes BID  . prednisoLONE acetate  1 drop Right Eye Daily  .  timolol  1 drop Both Eyes BID  . vitamin B-12  1,000 mcg Oral Daily  . vitamin C  500 mg Oral BID   Continuous Infusions:   LOS: 4 days   [place]  [facility]   *Please refer to [email or web address], password [email or web address] to get updated schedule on who will round on this patient, as hospitalists switch teams weekly. If 7PM-7AM, please contact night-coverage at [email or web address], password [email or web address] for any overnight needs.  06/27/2019, 10:43 AM

## 2019-06-27 NOTE — Evaluation (Signed)
Physical Therapy Evaluation Patient Details Name: Tracey Porter MRN: [id number] DOB: 18-Sep-1929 Today's Date: 06/27/2019   History of Present Illness  85 y.o. female with memory issues with undiagnosed dementia, diabetes mellitus on diet, hypertension, paroxysmal atrial fibrillation recently had right hip fracture and is on rehab was found to be positive for Covid and but hypoxic brought to ED. Admitted 06/23/19 afor treatment of Acute respiratory failure with hypoxia secondary to Covid pneumonia.  Clinical Impression  Pt with difficulty relaying sequence of events. Pt is thought to have been living in ALF, experienced fall with R hip fx and was at [facility] rehabbing when she was brought to hospital with complications from COVID. Pt also confused about being at [facility]. In addition to being limited in safe mobility by decreased cognition, pt has generalized weakness and decreased balance. Pt is min guard for bed mobility, and min A for transfers and ambulation with RW. PT recommends return to rehab when medically ready. PT will continue to follow acutely.     Follow Up Recommendations SNF    Equipment Recommendations  Other (comment)(TBD at next venue)    Recommendations for Other Services       Precautions / Restrictions Precautions Precautions: Fall Precaution Comments: hx falls, R hip fx Restrictions Weight Bearing Restrictions: No      Mobility  Bed Mobility Overal bed mobility: Needs Assistance Bed Mobility: Supine to Sit;Sit to Supine     Supine to sit: Min guard Sit to supine: Min guard   General bed mobility comments: min guard for safety to come to EoB and get back into bed  Transfers Overall transfer level: Needs assistance Equipment used: Rolling walker (2 wheeled) Transfers: Sit to/from Stand Sit to Stand: Min assist;+2 safety/equipment         General transfer comment: good power up, minAx2 for steadying  Ambulation/Gait Ambulation/Gait assistance:  Min assist;+2 safety/equipment Gait Distance (Feet): 20 Feet Assistive device: Rolling walker (2 wheeled) Gait Pattern/deviations: Step-through pattern;Decreased step length - right;Decreased step length - left;Trunk flexed;Shuffle Gait velocity: slowed Gait velocity interpretation: <1.8 ft/sec, indicate of risk for recurrent falls General Gait Details: minA for steadying with slow shuffling gait, no overt LoB        Balance Overall balance assessment: Needs assistance Sitting-balance support: Feet supported;No upper extremity supported Sitting balance-Leahy Scale: Fair     Standing balance support: Bilateral upper extremity supported;Single extremity supported Standing balance-Leahy Scale: Poor Standing balance comment: requires at least single UE support to maintain balance while brushing teeth                             Pertinent Vitals/Pain Pain Assessment: No/denies pain    Home Living Family/patient expects to be discharged to:: Assisted living               Home Equipment: Walker - 2 wheels;Shower seat - built in;Grab bars - toilet;Grab bars - tub/shower;Bedside commode      Prior Function Level of Independence: Needs assistance   Gait / Transfers Assistance Needed: ambulates with RW limited community distances   ADL's / Homemaking Assistance Needed: staff assist/observation of bathing and dressing, meals prepared by facility            Extremity/Trunk Assessment   Upper Extremity Assessment Upper Extremity Assessment: Defer to OT evaluation    Lower Extremity Assessment Lower Extremity Assessment: Generalized weakness;Difficult to assess due to impaired cognition       Communication  Communication: No difficulties  Cognition Arousal/Alertness: Awake/alert Behavior During Therapy: WFL for tasks assessed/performed Overall Cognitive Status: Impaired/Different from baseline Area of Impairment: Orientation                  Orientation Level: Disoriented to;Time;Place;Situation             General Comments: chart review indicates undiagnosed dementia, kept trying to clarify where she was and why she was there        General Comments General comments (skin integrity, edema, etc.): C/o of dizziness, BP prior to ambulation 114/55. after ambulation 106/56, able to maintain SaO2 >90% on RA        Assessment/Plan    PT Assessment Patient needs continued PT services  PT Problem List Decreased strength;Decreased activity tolerance;Decreased balance;Decreased mobility;Decreased cognition;Decreased safety awareness       PT Treatment Interventions DME instruction;Gait training;Functional mobility training;Therapeutic activities;Therapeutic exercise;Cognitive remediation;Patient/family education    PT Goals (Current goals can be found in the Care Plan section)  Acute Rehab PT Goals Patient Stated Goal: work on her needlepoint PT Goal Formulation: With patient Time For Goal Achievement: 07/11/19 Potential to Achieve Goals: Good    Frequency Min 2X/week   Barriers to discharge        Co-evaluation PT/OT/SLP Co-Evaluation/Treatment: Yes Reason for Co-Treatment: For patient/therapist safety PT goals addressed during session: Mobility/safety with mobility         AM-PAC PT "6 Clicks" Mobility  Outcome Measure Help needed turning from your back to your side while in a flat bed without using bedrails?: None Help needed moving from lying on your back to sitting on the side of a flat bed without using bedrails?: None Help needed moving to and from a bed to a chair (including a wheelchair)?: A Little Help needed standing up from a chair using your arms (e.g., wheelchair or bedside chair)?: A Little Help needed to walk in hospital room?: A Little Help needed climbing 3-5 steps with a railing? : A Lot 6 Click Score: 19    End of Session Equipment Utilized During Treatment: Gait belt Activity  Tolerance: Patient tolerated treatment well Patient left: in bed;with call bell/phone within reach;with bed alarm set Nurse Communication: Mobility status PT Visit Diagnosis: Unsteadiness on feet (R26.81);Other abnormalities of gait and mobility (R26.89);Muscle weakness (generalized) (M62.81);Difficulty in walking, not elsewhere classified (R26.2)    Time: 3810-1751 PT Time Calculation (min) (ACUTE ONLY): 28 min   Charges:   PT Evaluation $PT Eval Moderate Complexity: 1 Mod          Braxen Dobek B. Migdalia Dk PT, DPT Acute Rehabilitation Services Pager [phone] Office [phone]   [place] 06/27/2019, 1:55 PM

## 2019-06-27 NOTE — Evaluation (Signed)
Occupational Therapy Evaluation Patient Details Name: Tracey Porter MRN: [id number] DOB: October 14, 1929 Today's Date: 06/27/2019    History of Present Illness 85 y.o. female with memory issues with undiagnosed dementia, diabetes mellitus on diet, hypertension, paroxysmal atrial fibrillation recently had right hip fracture and is on rehab was found to be positive for Covid and but hypoxic brought to ED. Admitted 06/23/19 afor treatment of Acute respiratory failure with hypoxia secondary to Covid pneumonia.   Clinical Impression   This 85 y/o female presents with the above. PTA pt residing in ALF, reports receives assist for bathing, intermittently for dressing and use of RW for mobility. Pt requiring minA (+2) for room level mobility using RW this session; requiring minA for toileting and standing grooming ADL, modA for LB ADL. Pt with reports of dizziness initially with sitting upright, vitals monitored and stable throughout with O2 on RA. Pt with cognitive impairments requiring cues for safety and frequent reminders of current location, time. She will benefit from continued acute OT services and recommend follow up therapy services in SNF setting after discharge to maximize her safety and independence with ADL and mobility. Will follow.     Follow Up Recommendations  SNF;Supervision/Assistance - 24 hour    Equipment Recommendations  Other (comment)(defer to next venue)           Precautions / Restrictions Precautions Precautions: Fall Precaution Comments: hx falls, R hip fx Restrictions Weight Bearing Restrictions: No      Mobility Bed Mobility Overal bed mobility: Needs Assistance Bed Mobility: Supine to Sit;Sit to Supine     Supine to sit: Min guard Sit to supine: Min guard   General bed mobility comments: min guard for safety to come to EoB and get back into bed  Transfers Overall transfer level: Needs assistance Equipment used: Rolling walker (2 wheeled) Transfers: Sit  to/from Stand Sit to Stand: Min assist;+2 safety/equipment         General transfer comment: good power up, minAx2 for steadying    Balance Overall balance assessment: Needs assistance Sitting-balance support: Feet supported;No upper extremity supported Sitting balance-Leahy Scale: Fair     Standing balance support: Bilateral upper extremity supported;Single extremity supported Standing balance-Leahy Scale: Poor Standing balance comment: requires at least single UE support to maintain balance while brushing teeth                           ADL either performed or assessed with clinical judgement   ADL Overall ADL's : Needs assistance/impaired Eating/Feeding: Set up;Sitting Eating/Feeding Details (indicate cue type and reason): assist to open contatiners Grooming: Wash/dry hands;Minimal assistance;Standing   Upper Body Bathing: Sitting;Min guard   Lower Body Bathing: Moderate assistance;+2 for safety/equipment;Sit to/from stand   Upper Body Dressing : Min guard;Set up;Sitting   Lower Body Dressing: Moderate assistance;+2 for safety/equipment;Sit to/from stand   Toilet Transfer: Minimal assistance;Ambulation;BSC;RW;+2 for safety/equipment Toilet Transfer Details (indicate cue type and reason): ambulation to [facility] placed in room Toileting- Clothing Manipulation and Hygiene: Minimal assistance;Sit to/from stand Toileting - Clothing Manipulation Details (indicate cue type and reason): cues to perform pericare; assist for balance     Functional mobility during ADLs: Minimal assistance;+2 for safety/equipment;Rolling walker       Vision         Perception     Praxis      Pertinent Vitals/Pain Pain Assessment: No/denies pain     Hand Dominance     Extremity/Trunk Assessment Upper Extremity Assessment Upper  Extremity Assessment: Generalized weakness   Lower Extremity Assessment Lower Extremity Assessment: Defer to PT evaluation       Communication  Communication Communication: No difficulties   Cognition Arousal/Alertness: Awake/alert Behavior During Therapy: WFL for tasks assessed/performed Overall Cognitive Status: Impaired/Different from baseline Area of Impairment: Orientation                 Orientation Level: Disoriented to;Time;Place;Situation             General Comments: chart review indicates undiagnosed dementia, kept trying to clarify where she was and why she was there     General Comments  C/o of dizziness, BP prior to ambulation 114/55. after ambulation 106/56, able to maintain SaO2 >90% on RA    Exercises     Shoulder Instructions      Home Living Family/patient expects to be discharged to:: Assisted living                             Home Equipment: Walker - 2 wheels;Shower seat - built in;Grab bars - toilet;Grab bars - tub/shower;Bedside commode          Prior Functioning/Environment Level of Independence: Needs assistance  Gait / Transfers Assistance Needed: ambulates with RW limited community distances  ADL's / Homemaking Assistance Needed: staff assist/observation of bathing and dressing, meals prepared by facility             OT Problem List: Decreased strength;Decreased range of motion;Decreased activity tolerance;Impaired balance (sitting and/or standing);Decreased cognition;Decreased safety awareness;Decreased knowledge of use of DME or AE;Decreased knowledge of precautions;Cardiopulmonary status limiting activity      OT Treatment/Interventions: Self-care/ADL training;Therapeutic exercise;Energy conservation;DME and/or AE instruction;Therapeutic activities;Patient/family education;Balance training;Cognitive remediation/compensation    OT Goals(Current goals can be found in the care plan section) Acute Rehab OT Goals Patient Stated Goal: work on her needlepoint OT Goal Formulation: With patient Time For Goal Achievement: 07/11/19 Potential to Achieve Goals: Good   OT Frequency: Min 2X/week   Barriers to D/C:            Co-evaluation PT/OT/SLP Co-Evaluation/Treatment: Yes Reason for Co-Treatment: For patient/therapist safety PT goals addressed during session: Mobility/safety with mobility OT goals addressed during session: ADL's and self-care      AM-PAC OT "6 Clicks" Daily Activity     Outcome Measure Help from another person eating meals?: A Little Help from another person taking care of personal grooming?: A Little Help from another person toileting, which includes using toliet, bedpan, or urinal?: A Lot Help from another person bathing (including washing, rinsing, drying)?: A Lot Help from another person to put on and taking off regular upper body clothing?: A Little Help from another person to put on and taking off regular lower body clothing?: A Lot 6 Click Score: 15   End of Session Equipment Utilized During Treatment: Gait belt;Rolling walker Nurse Communication: Mobility status  Activity Tolerance: Patient tolerated treatment well Patient left: in bed;with call bell/phone within reach;with bed alarm set  OT Visit Diagnosis: Unsteadiness on feet (R26.81);Muscle weakness (generalized) (M62.81);Other symptoms and signs involving cognitive function                Time: 3419-6222 OT Time Calculation (min): 28 min Charges:  OT General Charges $OT Visit: 1 Visit OT Evaluation $OT Eval Moderate Complexity: 1 Mod  Marcy Siren, OT [employer] Pager [phone] Office [phone]   [place] 06/27/2019, 4:09 PM

## 2019-06-27 NOTE — TOC Progression Note (Addendum)
Transition of Care [facility]) - Progression Note    Patient Details  Name: Lilyth Lawyer MRN: [id number] Date of Birth: 01/11/1930  [facility] CM/SW Contact  Maryclare Labrador, RN Phone Number: 06/27/2019, 4:05 PM  Clinical Narrative:  SNF recommended by therapy.  CM contacted facility and informed of recommendation. CM confirmed with Iman with [facility] that pt is from the ALF portion and was not at a SNF PTA.   Facility informed CM they will review therapy note and follow back up with CM       Expected Discharge Plan: Assisted Living Barriers to Discharge: Continued Medical Work up  Expected Discharge Plan and Services Expected Discharge Plan: Assisted Living     Post Acute Care Choice: (Back to [place]) Living arrangements for the past 2 months: [place]                                       Social Determinants of Health (SDOH) Interventions    Readmission Risk Interventions No flowsheet data found.

## 2019-06-28 LAB — COMPREHENSIVE METABOLIC PANEL
ALT: 75 U/L — ABNORMAL HIGH (ref 0–44)
AST: 27 U/L (ref 15–41)
Albumin: 2.9 g/dL — ABNORMAL LOW (ref 3.5–5.0)
Alkaline Phosphatase: 65 U/L (ref 38–126)
Anion gap: 11 (ref 5–15)
BUN: 16 mg/dL (ref 8–23)
CO2: 27 mmol/L (ref 22–32)
Calcium: 8.8 mg/dL — ABNORMAL LOW (ref 8.9–10.3)
Chloride: 99 mmol/L (ref 98–111)
Creatinine, Ser: 0.65 mg/dL (ref 0.44–1.00)
GFR calc Af Amer: >60 mL/min (ref >60)
GFR calc non Af Amer: >60 mL/min (ref >60)
Glucose, Bld: 127 mg/dL — ABNORMAL HIGH (ref 70–99)
Potassium: 3.8 mmol/L (ref 3.5–5.1)
Sodium: 137 mmol/L (ref 135–145)
Total Bilirubin: 0.3 mg/dL (ref 0.3–1.2)
Total Protein: 6.1 g/dL — ABNORMAL LOW (ref 6.5–8.1)

## 2019-06-28 LAB — CBC WITH DIFFERENTIAL/PLATELET
Abs Immature Granulocytes: 0.06 10*3/uL (ref 0.00–0.07)
Basophils Absolute: 0 10*3/uL (ref 0.0–0.1)
Basophils Relative: 0 %
Eosinophils Absolute: 0 10*3/uL (ref 0.0–0.5)
Eosinophils Relative: 0 %
HCT: 35.1 % — ABNORMAL LOW (ref 36.0–46.0)
Hemoglobin: 11.6 g/dL — ABNORMAL LOW (ref 12.0–15.0)
Immature Granulocytes: 1 %
Lymphocytes Relative: 8 %
Lymphs Abs: 0.7 10*3/uL (ref 0.7–4.0)
MCH: 32.6 pg (ref 26.0–34.0)
MCHC: 33 g/dL (ref 30.0–36.0)
MCV: 98.6 fL (ref 80.0–100.0)
Monocytes Absolute: 0.7 10*3/uL (ref 0.1–1.0)
Monocytes Relative: 8 %
Neutro Abs: 7.2 10*3/uL (ref 1.7–7.7)
Neutrophils Relative %: 83 %
Platelets: 420 10*3/uL — ABNORMAL HIGH (ref 150–400)
RBC: 3.56 MIL/uL — ABNORMAL LOW (ref 3.87–5.11)
RDW: 13 % (ref 11.5–15.5)
WBC: 8.6 10*3/uL (ref 4.0–10.5)
nRBC: 0 % (ref 0.0–0.2)

## 2019-06-28 LAB — D-DIMER, QUANTITATIVE: D-Dimer, Quant: 0.61 ug/mL-FEU — ABNORMAL HIGH (ref 0.00–0.50)

## 2019-06-28 LAB — PHOSPHORUS: Phosphorus: 2.8 mg/dL (ref 2.5–4.6)

## 2019-06-28 LAB — GLUCOSE, CAPILLARY
Glucose-Capillary: 115 mg/dL — ABNORMAL HIGH (ref 70–99)
Glucose-Capillary: 159 mg/dL — ABNORMAL HIGH (ref 70–99)
Glucose-Capillary: 141 mg/dL — ABNORMAL HIGH (ref 70–99)
Glucose-Capillary: 150 mg/dL — ABNORMAL HIGH (ref 70–99)

## 2019-06-28 LAB — C-REACTIVE PROTEIN: CRP: 1.2 mg/dL — ABNORMAL HIGH (ref <1.0)

## 2019-06-28 LAB — FERRITIN: Ferritin: 285 ng/mL (ref 11–307)

## 2019-06-28 LAB — MAGNESIUM: Magnesium: 2.1 mg/dL (ref 1.7–2.4)

## 2019-06-28 NOTE — Care Management (Addendum)
CM faxed  OT notes to [place] with [facility].  PT note was faxed yesterday and reviewed by facility. CM contacted pts daughter and read aloud therapy notes - daughter feels that current facility can provide what she needs , " what they said was the same as what she was doing before".  Daughter reached out to ALF directly and discussed therapy recommendations with Iman the admission coordinator.  Daughter follwed back up with CM and confirms the plan to discharge back to current facility.  CM also followed back up with the facility to confirm.  Per Judieth Keens;  pts needs can be met at [facility], facility acknowledges recommendation for SNF/ 24 hour supervision per both OT and PT [facility] therapy.    Facility requests on day of discharge for pt to arrive at facility pror to 4pm.  CM will complete FL2 on/close to day of discharge and gain final concurrence from [place] prior to pt leaving [facility] on day of discharge as requested.  Attending aware that ALF is confident they can manage pts needs.     1600: Update:   Iman informed that pt is a tentative discharge tomorrow 12/18.  FL2 completed and awaiting signature.    1700:  Signed FL2 faxed to [phone]

## 2019-06-28 NOTE — NC FL2 (Signed)
[place] LEVEL OF CARE SCREENING TOOL     IDENTIFICATION  Patient Name: Tracey Porter Birthdate: 26-Mar-1930 Sex: female Admission Date (Current Location): 06/22/2019  [facility] and [place] Number:  Herbalist and Address:  [facility] [place], [place] [address], [place], [place] [address]      Provider Number: [id number]  Attending Physician Name and Address:  Jennye Boroughs, MD  Relative Name and Phone Number:  Daughter Anderson Malta  [phone]    Current Level of Care: Hospital Recommended Level of Care: [place] Prior Approval Number:    Date Approved/Denied:   PASRR Number:    Discharge Plan: Other (Comment)(Assisted Living Facility)    Current Diagnoses: Patient Active Problem List   Diagnosis Date Noted  . Acute respiratory disease due to COVID-19 virus 06/23/2019  . Acute respiratory failure with hypoxia ([place]) 06/23/2019  . Displaced fracture of right femoral neck ([place]) 04/30/2018  . Chronic back pain 10/10/2017  . Dyslipidemia 10/10/2017  . PAF (paroxysmal atrial fibrillation) ([place]) 07/28/2017  . Orthostatic hypotension 07/28/2017  . History of GI bleed 07/28/2017  . Chronic diastolic heart failure ([place])   . Multiple fractures of pelvis with unstable disruption of pelvic ring, initial encounter for closed fracture ([place]) 03/20/2017  . Atrial fibrillation with RVR ([place]) 03/20/2017  . Aortic atherosclerosis ([place]) 03/20/2017  . Hypotension due to blood loss   . Chronic anticoagulation   . Closed pelvic fracture ([place]) 03/19/2017  . Leukocytosis 03/19/2017  . Fall at home, initial encounter 03/19/2017  . Paroxysmal SVT (supraventricular tachycardia) ([place])   . Essential hypertension   . Non-insulin treated type 2 diabetes mellitus ([place]) 03/20/2015  . Hypokalemia 03/19/2015    Orientation RESPIRATION BLADDER Height & Weight     (memory issues with undiagnosed dementia)  Normal Continent Weight: 71.6  kg Height:  5\' 6"  (167.6 cm)  BEHAVIORAL SYMPTOMS/MOOD NEUROLOGICAL BOWEL NUTRITION STATUS  (memory issues with undiagnosed dementia)   Continent Diet(heart healthy carb modified)  AMBULATORY STATUS COMMUNICATION OF NEEDS Skin   Limited Assist Verbally Skin abrasions, Bruising(Buttock has abrasion moisture associated, bruising on bilateral arms)                       Personal Care Assistance Level of Assistance  Bathing, Feeding, Dressing Bathing Assistance: Limited assistance Feeding assistance: Limited assistance Dressing Assistance: Limited assistance     Functional Limitations Info             SPECIAL CARE FACTORS FREQUENCY                       Contractures Contractures Info: Not present(not documented)    Additional Factors Info  Code Status, Allergies, Isolation Precautions Code Status Info: FULL Allergies Info: penicillin     Isolation Precautions Info: COVID Positive     Current Medications (06/28/2019):  This is the current hospital active medication list Current Facility-Administered Medications  Medication Dose Route Frequency Provider Last Rate Last Admin  . acetaminophen (TYLENOL) tablet 650 mg  650 mg Oral Q6H PRN Rise Patience, MD   650 mg at 06/25/19 2144   Or  . acetaminophen (TYLENOL) suppository 650 mg  650 mg Rectal Q6H PRN Rise Patience, MD      . acyclovir (ZOVIRAX) tablet 800 mg  800 mg Oral BID Rise Patience, MD   800 mg at 06/28/19 0857  . amiodarone (PACERONE) tablet 200 mg  200 mg Oral  Daily Eduard ClosKakrakandy, Arshad N, MD   200 mg at 06/28/19 [id number]  . amLODipine (NORVASC) tablet 5 mg  5 mg Oral Daily Eduard ClosKakrakandy, Arshad N, MD   5 mg at 06/28/19 0841  . apixaban (ELIQUIS) tablet 5 mg  5 mg Oral BID Eduard ClosKakrakandy, Arshad N, MD   5 mg at 06/28/19 0843  . Chlorhexidine Gluconate Cloth 2 % PADS 6 each  6 each Topical Q0600 Kyle, Tyrone A, DO   6 each at 06/27/19 1105  . chlorpheniramine-HYDROcodone (TUSSIONEX) 10-8 MG/5ML  suspension 5 mL  5 mL Oral Q12H PRN Ronaldo MiyamotoKyle, Tyrone A, DO      . cholecalciferol (VITAMIN D3) tablet 5,000 Units  5,000 Units Oral Daily Eduard ClosKakrakandy, Arshad N, MD   5,000 Units at 06/28/19 [phone]  . dexamethasone (DECADRON) injection 6 mg  6 mg Intravenous Q24H Eduard ClosKakrakandy, Arshad N, MD   6 mg at 06/28/19 0540  . diltiazem (CARDIZEM CD) 24 hr capsule 120 mg  120 mg Oral Daily Eduard ClosKakrakandy, Arshad N, MD   120 mg at 06/28/19 [id number]  . ferrous sulfate tablet 325 mg  325 mg Oral Q breakfast Eduard ClosKakrakandy, Arshad N, MD   325 mg at 06/28/19 0841  . guaiFENesin-dextromethorphan (ROBITUSSIN DM) 100-10 MG/5ML syrup 5 mL  5 mL Oral Q4H PRN Kyle, Tyrone A, DO      . insulin aspart (novoLOG) injection 0-9 Units  0-9 Units Subcutaneous TID WC Eduard ClosKakrakandy, Arshad N, MD   2 Units at 06/28/19 1258  . labetalol (NORMODYNE) tablet 100 mg  100 mg Oral QHS Eduard ClosKakrakandy, Arshad N, MD   100 mg at 06/27/19 2211  . labetalol (NORMODYNE) tablet 200 mg  200 mg Oral Daily Eduard ClosKakrakandy, Arshad N, MD   200 mg at 06/28/19 0841  . lactobacillus acidophilus & bulgar (LACTINEX) chewable tablet 1 tablet  1 tablet Oral Daily Eduard ClosKakrakandy, Arshad N, MD   1 tablet at 06/28/19 0857  . lidocaine (LIDODERM) 5 % 1 patch  1 patch Transdermal Daily PRN Eduard ClosKakrakandy, Arshad N, MD      . magnesium chloride (SLOW-MAG) 64 MG SR tablet 64 mg  1 tablet Oral Daily Eduard ClosKakrakandy, Arshad N, MD   64 mg at 06/28/19 0856  . Melatonin TABS 3 mg  3 mg Oral QHS Kyle, Tyrone A, DO   3 mg at 06/27/19 2211  . mirabegron ER (MYRBETRIQ) tablet 25 mg  25 mg Oral Daily Eduard ClosKakrakandy, Arshad N, MD   25 mg at 06/28/19 0856  . multivitamin (PROSIGHT) tablet 1 tablet  1 tablet Oral BID Eduard ClosKakrakandy, Arshad N, MD   1 tablet at 06/28/19 [phone]  . mupirocin ointment (BACTROBAN) 2 % 1 application  1 application Nasal BID Ronaldo MiyamotoKyle, Tyrone A, DO   1 application at 06/28/19 0847  . nitroGLYCERIN (NITROSTAT) SL tablet 0.4 mg  0.4 mg Sublingual Q5 min PRN Eduard ClosKakrakandy, Arshad N, MD      . ondansetron [facility](ZOFRAN)  tablet 4 mg  4 mg Oral Q6H PRN Eduard ClosKakrakandy, Arshad N, MD       Or  . ondansetron [facility](ZOFRAN) injection 4 mg  4 mg Intravenous Q6H PRN Eduard ClosKakrakandy, Arshad N, MD      . pantoprazole (PROTONIX) EC tablet 40 mg  40 mg Oral Daily Eduard ClosKakrakandy, Arshad N, MD   40 mg at 06/28/19 [id number]  . polyethylene glycol (MIRALAX / GLYCOLAX) packet 17 g  17 g Oral Daily Eduard ClosKakrakandy, Arshad N, MD   17 g at 06/28/19 0843  . polyvinyl alcohol (LIQUIFILM TEARS) 1.4 % ophthalmic solution 1  drop  1 drop Both Eyes BID Eduard Clos, MD   1 drop at 06/28/19 0846  . prednisoLONE acetate (PRED FORTE) 1 % ophthalmic suspension 1 drop  1 drop Right Eye Daily Eduard Clos, MD   1 drop at 06/28/19 0844  . senna-docusate (Senokot-S) tablet 1 tablet  1 tablet Oral Daily PRN Eduard Clos, MD      . timolol (TIMOPTIC) 0.5 % ophthalmic solution 1 drop  1 drop Both Eyes BID Eduard Clos, MD   1 drop at 06/28/19 0848  . traZODone (DESYREL) tablet 50 mg  50 mg Oral QHS PRN Eduard Clos, MD   50 mg at 06/25/19 2144  . vitamin B-12 (CYANOCOBALAMIN) tablet 1,000 mcg  1,000 mcg Oral Daily Eduard Clos, MD   1,000 mcg at 06/28/19 [phone]  . vitamin C (ASCORBIC ACID) tablet 500 mg  500 mg Oral BID Eduard Clos, MD   500 mg at 06/28/19 6270     Discharge Medications: Please see discharge summary for a list of discharge medications.  Relevant Imaging Results:  Relevant Lab Results:   Additional Information Patients Social Security Number [id number]  Cherylann Parr, RN

## 2019-06-28 NOTE — Progress Notes (Addendum)
Progress Note    Tracey Porter  ONG:[id number] DOB: 10/31/1929  DOA: 06/22/2019 PCP: [name], Henry, MD      Brief Narrative:    Medical records reviewed and are as summarized below:  Tracey PingConstance Porter is an 85 y.o. female with memory issues with undiagnosed dementia, diabetes mellitus on diet, hypertension, paroxysmal atrial fibrillation recently right hip fracture who was in rehabwas found to be positive for Covid 19 with hypoxemia.  She was subsequently transferred to the emergency room for further management.        Assessment/Plan:   Principal Problem:   Acute respiratory disease due to COVID-19 virus Active Problems:   Essential hypertension   PAF (paroxysmal atrial fibrillation) (HCC)   Acute respiratory failure with hypoxia (HCC)   Body mass index is 25.48 kg/m.    COVID-19 pneumonia: Completed IV remdesivir on 06/27/2019.  Continue IV dexamethasone.  Dexamethasone to be completed on 07/03/2019 but she can be discharged back to SNF tomorrow if she continues to improve.  Robitussin as needed for cough.  Acute hypoxemic respiratory failure: Resolved.  Tolerating room air.  Paroxysmal atrial fibrillation: Continue Eliquis, amiodarone and Cardizem  Mildly elevated liver enzymes: Hepatitis panel was negative.  No liver abnormality seen on CT scan.  Suspected right kidney cyst, 1.8 cm: Noted on CT scan: Renal ultrasound can be done in the outpatient setting for follow-up.    Hypertension: Continue antihypertensives  Memory impairment with possible underlying dementia: supportive care.  Outpatient follow-up  Recent right hip fracture status post surgery: PT and OT.   Family Communication/Anticipated D/C date and plan/Code Status   DVT prophylaxis: Eliquis Code Status: Full code Family Communication: Discussed with the patient Disposition Plan: Likely discharge to SNF tomorrow      Subjective:   No shortness of breath, chest pain, cough or  fever.  Objective:    Vitals:   06/27/19 2007 06/28/19 0400 06/28/19 0800 06/28/19 1248  BP:  (!) 131/54 132/61 127/60  Pulse:  67 65 67  Resp:  (!) 21 15 17   Temp: 98.4 F (36.9 C) 98.5 F (36.9 C) 98.8 F (37.1 C) 98.5 F (36.9 C)  TempSrc: Oral Oral Oral Oral  SpO2:  93% 94% 95%  Weight:      Height:        Intake/Output Summary (Last 24 hours) at 06/28/2019 1328 Last data filed at 06/27/2019 2027 Gross per 24 hour  Intake 240 ml  Output --  Net 240 ml   Filed Weights   06/22/19 1909  Weight: 71.6 kg    Exam:  GEN: NAD SKIN: No rash EYES: EOMI ENT: MMM CV: RRR PULM: CTA B ABD: soft, ND, NT, +BS CNS: AAO x 3, non focal EXT: No edema or tenderness    Data Reviewed:   I have personally reviewed following labs and imaging studies:  Labs: Labs show the following:   Basic Metabolic Panel: Recent Labs  Lab 06/24/19 0415 06/25/19 0450 06/26/19 0428 06/27/19 0358 06/28/19 0522  NA 135 139 135 138 137  K 4.1 3.5 4.0 3.6 3.8  CL 98 104 99 99 99  CO2 22 25 24 28 27   GLUCOSE 124* 86 117* 121* 127*  BUN 24* 19 14 17 16   CREATININE 1.01* 0.93 0.55 0.69 0.65  CALCIUM 9.0 8.6* 8.8* 9.2 8.8*  MG 2.2 1.8 1.8 1.9 2.1  PHOS 3.5 3.2 2.4* 3.4 2.8   GFR Estimated Creatinine Clearance: 50.2 mL/min (by C-G formula based on SCr of  0.65 mg/dL). Liver Function Tests: Recent Labs  Lab 06/24/19 0415 06/25/19 0450 06/26/19 0428 06/27/19 0358 06/28/19 0522  AST 64* 64* 56* 44* 27  ALT 83* 96* 90* 94* 75*  ALKPHOS 66 62 66 64 65  BILITOT 0.8 0.8 0.8 0.6 0.3  PROT 7.0 6.1* 6.5 6.9 6.1*  ALBUMIN 3.3* 2.8* 3.1* 3.1* 2.9*   No results for input(s): LIPASE, AMYLASE in the last 168 hours. No results for input(s): AMMONIA in the last 168 hours. Coagulation profile No results for input(s): INR, PROTIME in the last 168 hours.  CBC: Recent Labs  Lab 06/24/19 0500 06/25/19 0450 06/26/19 0535 06/27/19 0358 06/28/19 0522  WBC 6.3 7.0 7.0 10.4 8.6  NEUTROABS  4.7 4.9 5.4 8.7* 7.2  HGB 12.5 11.0* 11.9* 12.0 11.6*  HCT 37.3 33.2* 35.1* 35.5* 35.1*  MCV 98.9 98.8 97.5 96.7 98.6  PLT 349 334 359 378 420*   Cardiac Enzymes: No results for input(s): CKTOTAL, CKMB, CKMBINDEX, TROPONINI in the last 168 hours. BNP (last 3 results) No results for input(s): PROBNP in the last 8760 hours. CBG: Recent Labs  Lab 06/27/19 1140 06/27/19 1645 06/27/19 2128 06/28/19 0752 06/28/19 1154  GLUCAP 182* 175* 138* 115* 159*   D-Dimer: Recent Labs    06/27/19 0358 06/28/19 0522  DDIMER 0.68* 0.61*   Hgb A1c: No results for input(s): HGBA1C in the last 72 hours. Lipid Profile: No results for input(s): CHOL, HDL, LDLCALC, TRIG, CHOLHDL, LDLDIRECT in the last 72 hours. Thyroid function studies: No results for input(s): TSH, T4TOTAL, T3FREE, THYROIDAB in the last 72 hours.  Invalid input(s): FREET3 Anemia work up: Recent Labs    06/27/19 0358 06/28/19 0522  FERRITIN 308* 285   Sepsis Labs: Recent Labs  Lab 06/25/19 0450 06/26/19 0535 06/27/19 0358 06/28/19 0522  WBC 7.0 7.0 10.4 8.6    Microbiology Recent Results (from the past 240 hour(s))  MRSA PCR Screening     Status: Abnormal   Collection Time: 06/26/19  5:01 AM   Specimen: Nasal Mucosa; Nasopharyngeal  Result Value Ref Range Status   MRSA by PCR POSITIVE (A) NEGATIVE Final    Comment:        The GeneXpert MRSA Assay (FDA approved for NASAL specimens only), is one component of a comprehensive MRSA colonization surveillance program. It is not intended to diagnose MRSA infection nor to guide or monitor treatment for MRSA infections. RESULT CALLED TO, READ BACK BY AND VERIFIED WITH: RN Rex Kras [phone] [id number] FCP Performed at [facility], [place] [address] [place], [place] [address]     Procedures and diagnostic studies:  No results found.  Medications:   . acyclovir  800 mg Oral BID  . amiodarone  200 mg Oral Daily  . amLODipine  5 mg Oral Daily  . apixaban  5 mg  Oral BID  . Chlorhexidine Gluconate Cloth  6 each Topical Q0600  . cholecalciferol  5,000 Units Oral Daily  . dexamethasone (DECADRON) injection  6 mg Intravenous Q24H  . diltiazem  120 mg Oral Daily  . ferrous sulfate  325 mg Oral Q breakfast  . insulin aspart  0-9 Units Subcutaneous TID WC  . labetalol  100 mg Oral QHS  . labetalol  200 mg Oral Daily  . lactobacillus acidophilus & bulgar  1 tablet Oral Daily  . magnesium chloride  1 tablet Oral Daily  . Melatonin  3 mg Oral QHS  . mirabegron ER  25 mg Oral Daily  . multivitamin  1 tablet Oral BID  . mupirocin ointment  1 application Nasal BID  . pantoprazole  40 mg Oral Daily  . polyethylene glycol  17 g Oral Daily  . polyvinyl alcohol  1 drop Both Eyes BID  . prednisoLONE acetate  1 drop Right Eye Daily  . timolol  1 drop Both Eyes BID  . vitamin B-12  1,000 mcg Oral Daily  . vitamin C  500 mg Oral BID   Continuous Infusions:   LOS: 5 days   Dawsen Krieger  [facility]   *Please refer to [email or web address], password [email or web address] to get updated schedule on who will round on this patient, as hospitalists switch teams weekly. If 7PM-7AM, please contact night-coverage at [email or web address], password [email or web address] for any overnight needs.  06/28/2019, 1:28 PM

## 2019-06-29 DIAGNOSIS — I10 Essential (primary) hypertension: Secondary | ICD-10-CM

## 2019-06-29 DIAGNOSIS — U071 COVID-19: Principal | ICD-10-CM

## 2019-06-29 DIAGNOSIS — I480 Paroxysmal atrial fibrillation: Secondary | ICD-10-CM

## 2019-06-29 DIAGNOSIS — J069 Acute upper respiratory infection, unspecified: Secondary | ICD-10-CM

## 2019-06-29 DIAGNOSIS — J9601 Acute respiratory failure with hypoxia: Secondary | ICD-10-CM

## 2019-06-29 LAB — GLUCOSE, CAPILLARY
Glucose-Capillary: 118 mg/dL — ABNORMAL HIGH (ref 70–99)
Glucose-Capillary: 174 mg/dL — ABNORMAL HIGH (ref 70–99)

## 2019-06-29 MED ORDER — MELATONIN 3 MG PO TABS: 3.0000 mg | ORAL_TABLET | Freq: Every day | ORAL | 0 refills | Status: AC

## 2019-06-29 MED ORDER — DEXAMETHASONE 6 MG PO TABS: 6.0000 mg | ORAL_TABLET | Freq: Every day | ORAL | 0 refills | Status: DC

## 2019-06-29 MED ORDER — GUAIFENESIN-DM 100-10 MG/5ML PO SYRP: 5.0000 mL | ORAL_SOLUTION | ORAL | 0 refills | Status: DC | PRN

## 2019-06-29 NOTE — NC FL2 (Signed)
[place] LEVEL OF CARE SCREENING TOOL     IDENTIFICATION  Patient Name: Tracey Porter Birthdate: [birth year] Sex: female Admission Date (Current Location): 06/22/2019  [facility] and [place] Number:  Herbalist and Address:  [facility] [place]. [facility], [place] [address] [place], [place] [address]      Provider Number: [id number]  Attending Physician Name and Address:  Flora Lipps, MD  Relative Name and Phone Number:  Daughter Anderson Malta  [phone]    Current Level of Care: Hospital Recommended Level of Care: [place] Prior Approval Number:    Date Approved/Denied:   PASRR Number:    Discharge Plan: Other (Comment)(Assisted Living Facility)    Current Diagnoses: Patient Active Problem List   Diagnosis Date Noted  . Acute respiratory disease due to COVID-19 virus 06/23/2019  . Acute respiratory failure with hypoxia ([place]) 06/23/2019  . Displaced fracture of right femoral neck ([place]) 04/30/2018  . Chronic back pain 10/10/2017  . Dyslipidemia 10/10/2017  . PAF (paroxysmal atrial fibrillation) ([place]) 07/28/2017  . Orthostatic hypotension 07/28/2017  . History of GI bleed 07/28/2017  . Chronic diastolic heart failure ([place])   . Multiple fractures of pelvis with unstable disruption of pelvic ring, initial encounter for closed fracture ([place]) 03/20/2017  . Atrial fibrillation with RVR ([place]) 03/20/2017  . Aortic atherosclerosis ([place]) 03/20/2017  . Hypotension due to blood loss   . Chronic anticoagulation   . Closed pelvic fracture ([place]) 03/19/2017  . Leukocytosis 03/19/2017  . Fall at home, initial encounter 03/19/2017  . Paroxysmal SVT (supraventricular tachycardia) ([place])   . Essential hypertension   . Non-insulin treated type 2 diabetes mellitus ([place]) 03/20/2015  . Hypokalemia 03/19/2015    Orientation RESPIRATION BLADDER Height & Weight     (memory issues with undiagnosed dementia)  Normal Continent Weight: 72.7  kg Height:  5\' 6"  (167.6 cm)  BEHAVIORAL SYMPTOMS/MOOD NEUROLOGICAL BOWEL NUTRITION STATUS  (memory issues with undiagnosed dementia)   Continent NCS - Non concentrated sweet  AMBULATORY STATUS COMMUNICATION OF NEEDS Skin   Limited Assist Verbally Skin abrasions, Bruising(Buttock has abrasion moisture associated, bruising on bilateral arms)                       Personal Care Assistance Level of Assistance  Bathing, Feeding, Dressing Bathing Assistance: Limited assistance Feeding assistance: Limited assistance Dressing Assistance: Limited assistance     Functional Limitations Info             SPECIAL CARE FACTORS FREQUENCY                       Contractures Contractures Info: Not present(not documented)    Additional Factors Info  Code Status, Allergies, Isolation Precautions Code Status Info: FULL Allergies Info: penicillin     Isolation Precautions Info: COVID Positive     Current Medications  Discharge Medications: TAKE these medications   acetaminophen 500 MG tablet Commonly known as: TYLENOL Take 1,000 mg by mouth 2 (two) times daily.   acetaminophen 500 MG tablet Commonly known as: TYLENOL Take 500 mg by mouth every 4 (four) hours as needed for mild pain or headache.   acyclovir 800 MG tablet Commonly known as: ZOVIRAX Take 800 mg by mouth 2 (two) times daily.   amiodarone 200 MG tablet Commonly known as: PACERONE Take 200 mg by mouth daily.   amLODipine 5 MG tablet Commonly known as: NORVASC Take 5 mg by mouth daily.  apixaban 5 MG Tabs tablet Commonly known as: ELIQUIS Take 1 tablet (5 mg total) by mouth 2 (two) times daily. Please cancel previous rx's sent; call pt with price before filling   conjugated estrogens vaginal cream Commonly known as: PREMARIN Place 1 Applicatorful vaginally every other day.   Culturelle Kids Chew Chew 1 tablet by mouth daily.   D-3-5 125 MCG (5000 UT) capsule Generic drug:  Cholecalciferol Take 5,000 Units by mouth daily.   dexamethasone 6 MG tablet Commonly known as: DECADRON Take 1 tablet (6 mg total) by mouth daily.   diltiazem 120 MG 24 hr capsule Commonly known as: CARDIZEM CD Take 1 capsule (120 mg total) by mouth daily.   ferrous sulfate 325 (65 FE) MG tablet Take 325 mg by mouth daily with breakfast.   guaiFENesin-dextromethorphan 100-10 MG/5ML syrup Commonly known as: ROBITUSSIN DM Take 5 mLs by mouth every 4 (four) hours as needed for cough.   ICaps Plus Tabs Take 1 tablet by mouth 2 (two) times daily.   labetalol 100 MG tablet Commonly known as: NORMODYNE Take 100-200 mg by mouth See admin instructions. Take 2 tablets every morning and take 1 tablet at bedtime   labetalol 200 MG tablet Commonly known as: NORMODYNE Take 1 tablet (200 mg total) by mouth every morning AND 0.5 tablets (100 mg total) at bedtime.   lidocaine 5 % Commonly known as: LIDODERM Place 1 patch onto the skin as needed (for pain).   loperamide 2 MG tablet Commonly known as: IMODIUM A-D Take 2 mg by mouth as needed for diarrhea or loose stools.   loperamide 2 MG capsule Commonly known as: IMODIUM Take 1 capsule (2 mg total) by mouth as needed for diarrhea or loose stools.   Melatonin 3 MG Tabs Take 1 tablet (3 mg total) by mouth at bedtime.   Myrbetriq 25 MG Tb24 tablet Generic drug: mirabegron ER Take 25 mg by mouth daily.   nitroGLYCERIN 0.4 MG SL tablet Commonly known as: NITROSTAT Place 0.4 mg under the tongue every 5 (five) minutes as needed for chest pain.   ondansetron 8 MG tablet Commonly known as: ZOFRAN Take 8 mg by mouth 2 (two) times daily as needed for nausea or vomiting.   pantoprazole 40 MG tablet Commonly known as: PROTONIX Take 40 mg by mouth daily.   polyethylene glycol 17 g packet Commonly known as: MIRALAX / GLYCOLAX Take 17 g by mouth daily.   prednisoLONE acetate 1 % ophthalmic suspension Commonly known  as: PRED FORTE Place 1 drop into the right eye daily.   senna-docusate 8.6-50 MG tablet Commonly known as: Senokot-S Take 1 tablet by mouth daily as needed for mild constipation.   Slow-Mag 64 MG Tbec SR tablet Generic drug: magnesium chloride Take 1 tablet by mouth daily.   Systane 0.4-0.3 % Soln Generic drug: Polyethyl Glycol-Propyl Glycol Apply 1 drop to eye 2 (two) times daily.   timolol 0.5 % ophthalmic solution Commonly known as: BETIMOL Place 1 drop into both eyes 2 (two) times daily.   traZODone 50 MG tablet Commonly known as: DESYREL Take 50 mg by mouth at bedtime as needed for sleep.   vitamin B-12 1000 MCG tablet Commonly known as: CYANOCOBALAMIN Take 1,000 mcg by mouth daily.   vitamin C 500 MG tablet Commonly known as: ASCORBIC ACID Take 500 mg by mouth 2 (two) times daily.     Relevant Imaging Results:  Relevant Lab Results:   Additional Information Patients Social Security Number [id number]  Page SpiroClaxton,  Manfred Arch, RN

## 2019-06-29 NOTE — Progress Notes (Signed)
Physical Therapy Treatment Patient Details Name: Tracey Porter MRN: [id number] DOB: April 07, 1930 Today's Date: 06/29/2019    History of Present Illness 85 y.o. female with memory issues with undiagnosed dementia, diabetes mellitus on diet, hypertension, paroxysmal atrial fibrillation recently had right hip fracture and is on rehab was found to be positive for Covid and but hypoxic brought to ED. Admitted 06/23/19 afor treatment of Acute respiratory failure with hypoxia secondary to Covid pneumonia.    PT Comments    Pt supine in bed on entry, eager to get out of bed with therapy. Pt unaware of possible d/c today. Pt continues to be limited in safe mobility by decreased cogniton in presence of generalized weakness and associated decreased balance. Pt is min guard for bed mobility, and min A for transfers and ambulation with RW. PT continues to recommend SNF level rehab at discharge. Per notes pt's [facility] will be able to provide that level of support at discharge.    Follow Up Recommendations  SNF(pt ALF reports being able to provide that level of support)     Equipment Recommendations  Other (comment)(TBD at next venue)       Precautions / Restrictions Precautions Precautions: Fall Precaution Comments: hx falls, R hip fx Restrictions Weight Bearing Restrictions: No    Mobility  Bed Mobility Overal bed mobility: Needs Assistance Bed Mobility: Supine to Sit;Sit to Supine     Supine to sit: Min guard Sit to supine: Min guard   General bed mobility comments: min guard for safety to come to EoB and get back into bed  Transfers Overall transfer level: Needs assistance Equipment used: Rolling walker (2 wheeled) Transfers: Sit to/from Stand Sit to Stand: Min assist         General transfer comment: good power up, minA for steadying  Ambulation/Gait Ambulation/Gait assistance: Min assist Gait Distance (Feet): 35 Feet Assistive device: Rolling walker (2  wheeled) Gait Pattern/deviations: Step-through pattern;Decreased step length - right;Decreased step length - left;Trunk flexed;Shuffle Gait velocity: slowed Gait velocity interpretation: <1.8 ft/sec, indicate of risk for recurrent falls General Gait Details: minA for steadying with slow shuffling gait, increased speed and steadiness from last session          Balance Overall balance assessment: Needs assistance Sitting-balance support: Feet supported;No upper extremity supported Sitting balance-Leahy Scale: Fair     Standing balance support: Bilateral upper extremity supported;Single extremity supported Standing balance-Leahy Scale: Poor Standing balance comment: requires single UE support on RW for self pericare at [facility]                            Cognition Arousal/Alertness: Awake/alert Behavior During Therapy: WFL for tasks assessed/performed Overall Cognitive Status: Impaired/Different from baseline Area of Impairment: Orientation                 Orientation Level: Disoriented to;Time;Place;Situation             General Comments: chart review indicates undiagnosed dementia, kept trying to clarify where she was and why she was there           General Comments General comments (skin integrity, edema, etc.): pt with c/o of minor dizziness that quickly dissipated       Pertinent Vitals/Pain Pain Assessment: No/denies pain           PT Goals (current goals can now be found in the care plan section) Acute Rehab PT Goals Patient Stated Goal: work on her needlepoint  PT Goal Formulation: With patient Time For Goal Achievement: 07/11/19 Potential to Achieve Goals: Good Progress towards PT goals: Progressing toward goals    Frequency    Min 2X/week      PT Plan Current plan remains appropriate       AM-PAC PT "6 Clicks" Mobility   Outcome Measure  Help needed turning from your back to your side while in a flat bed without using  bedrails?: None Help needed moving from lying on your back to sitting on the side of a flat bed without using bedrails?: None Help needed moving to and from a bed to a chair (including a wheelchair)?: A Little Help needed standing up from a chair using your arms (e.g., wheelchair or bedside chair)?: A Little Help needed to walk in hospital room?: A Little Help needed climbing 3-5 steps with a railing? : A Lot 6 Click Score: 19    End of Session Equipment Utilized During Treatment: Gait belt Activity Tolerance: Patient tolerated treatment well Patient left: in bed;with call bell/phone within reach;with bed alarm set Nurse Communication: Mobility status PT Visit Diagnosis: Unsteadiness on feet (R26.81);Other abnormalities of gait and mobility (R26.89);Muscle weakness (generalized) (M62.81);Difficulty in walking, not elsewhere classified (R26.2)     Time: 8657-8469 PT Time Calculation (min) (ACUTE ONLY): 18 min  Charges:  $Gait Training: 8-22 mins                     Tracey Porter B. Beverely Risen PT, DPT Acute Rehabilitation Services Pager [phone] Office [phone]    Tracey Porter Fleet 06/29/2019, 3:39 PM

## 2019-06-29 NOTE — TOC Transition Note (Addendum)
Transition of Care [facility]) - CM/SW Discharge Note   Patient Details  Name: Tracey Porter MRN: [id number] Date of Birth: 01-23-1930  Transition of Care [facility]) CM/SW Contact:  Maryclare Labrador, RN Phone Number: 06/29/2019, 1:43 PM   Clinical Narrative:     CM faxed discharge summary to [place] with [facility]. Iman confirms receipt of PT and OT notes from 12/16 - facility remains in agreement to accept pt back.  CM informed daughter that pt will discharge back to ALF today - daughter remains in agreement and continues to decline SNF as recommended.  CM awaiting final review of discharge summary and FL2 by admission coordinator Iman.   Per facility - NCS should be documented for pts diet of carb modified on FL2.  Bedside nurse to ensure pt leaves [facility] with; eye glasses, needle point, cell phone and charger.    1441: Update:  Facility confirms they will accept pt today - facility aware pt may arrive after 4pm due to current time.  PTAR called.  Transport pkg printed to [employer].  Nurse to call report to [phone]    Final next level of care: Assisted Living Barriers to Discharge: Barriers Resolved   Patient Goals and CMS Choice        Discharge Placement              Patient chooses bed at: (Daughter in agreement for pt to return to facility) Patient to be transferred to facility by: [place] Name of family member notified: Daughter Anderson Malta Patient and family notified of of transfer: 06/29/19  Discharge Plan and Services     Post Acute Care Choice: (Back to [place])                               Social Determinants of Health (SDOH) Interventions     Readmission Risk Interventions No flowsheet data found.

## 2019-06-29 NOTE — Care Management Important Message (Addendum)
Important Message  Patient Details  Name: Tracey Porter MRN: [id number] Date of Birth: 1930-05-01   Medicare Important Message Given:  Yes - Important Message mailed due to current National Emergency  Verbal consent obtained due to current National Emergency  Relationship to patient: Child Contact Name: Robie Ridge Call Date: 06/29/19  Time: 5284 Phone number [phone] Outcome: No Answer/Busy Important Message mailed to: Patient address on file    Delorse Lek 06/29/2019, 1:54 PM

## 2019-06-29 NOTE — Discharge Summary (Addendum)
Physician Discharge Summary  Tracey Porter ZOX:[id number] DOB: 09-15-1929 DOA: 06/22/2019  PCP: Florentina Jenny, MD  Admit date: 06/22/2019 Discharge date: 06/29/2019  Admitted From:  ALF  Discharge disposition: ALF   Recommendations for Outpatient Follow-Up:    Follow up with your primary care provider at the  ALF in 3-5 days.  Check CBC BMP at that time.   Discharge Diagnosis:   Principal Problem:   Acute respiratory disease due to COVID-19 virus Active Problems:   Essential hypertension   PAF (paroxysmal atrial fibrillation) (HCC)   Acute respiratory failure with hypoxia (HCC)    Discharge Condition: Improved.  Diet recommendation:  Carbohydrate-modified.   Wound care: None.  Code status: Full.   History of Present Illness:   Tracey Porter is an 85 y.o. female withmemory issues with undiagnosed dementia, diabetes mellitus on diet, hypertension, paroxysmal atrial fibrillation recently right hip fracture who was in rehab was found to be positive for Covid 19 with hypoxemia.  She was subsequently transferred to the emergency room for further management.    Hospital Course:  Following conditions were addressed during hospitalization, follow-up plan in italics,  COVID-19 pneumonia: Completed IV remdesivir on 06/27/2019.  Received IV dexamethasone.  Dexamethasone to be completed on 07/03/2019 but will change to p.o. dexamethasone for the next 4 days. Robitussin as needed for cough.  Hyperglycemia during dexamethasone. diet modification on discharge to diabetic diet..  Last A1c of 5.9.  Could consider switching to regular diet after completion steroids.  Acute hypoxemic respiratory failure secondary to Covid pneumonia Resolved.  On room air.  Paroxysmal atrial fibrillation: Continue Eliquis, amiodarone and Cardizem.  Rate controlled at this time.  Mildly elevated liver enzymes: On presentation hepatitis panel was negative.  No liver abnormality seen on CT  scan.  AST of 27 and ALT of 75 on discharge.  This will need to be monitored as outpatient on a regular basis.  Suspected right kidney cyst, 1.8 cm: Noted on CT scan: Renal ultrasound can be done in the outpatient setting for follow-up.    Hypertension: Continue antihypertensives on discharge.  Blood pressure prior to discharge was 130/65.  Memory impairment with possible underlying dementia: supportive care.  Recent right hip fracture status post recent surgery: PT and OT to be continued after discharge at the skilled nursing facility.  Disposition: At this time, patient is stable for disposition to Assisted living facility. Spoke with the patient's on the phone.   Medical Consultants:    None.  Subjective:   Today, patient feels okay.  Denies any shortness of breath, chest pain, palpitation or fever.  Discharge Exam:   Vitals:   06/29/19 0521 06/29/19 1005  BP: 121/60 138/65  Pulse: 63   Resp: 17   Temp: 98.2 F (36.8 C)   SpO2: 93%    Vitals:   06/28/19 2107 06/29/19 0331 06/29/19 0521 06/29/19 1005  BP: 128/66  121/60 138/65  Pulse: 67  63   Resp: 18  17   Temp: 98.6 F (37 C)  98.2 F (36.8 C)   TempSrc: Oral  Axillary   SpO2: 93%  93%   Weight:  72.7 kg    Height:        General exam: Appears calm and comfortable ,Not in distress HEENT:PERRL,Oral mucosa moist Respiratory system: Diminished breath sounds bilaterally. Cardiovascular system: S1 & S2 heard, RRR.  Gastrointestinal system: Abdomen is nondistended, soft and nontender. No organomegaly or masses felt. Normal bowel sounds heard. Central nervous system: Alert awake and  communicative no focal neurological deficits. Extremities: No edema, no clubbing ,no cyanosis, distal peripheral pulses palpable. Skin: No rashes, lesions or ulcers,no icterus ,no pallor MSK: Normal muscle bulk,tone ,power    Procedures:    None  The results of significant diagnostics from this hospitalization (including  imaging, microbiology, ancillary and laboratory) are listed below for reference.     Diagnostic Studies:   DG Chest Port 1 View  Result Date: 06/22/2019 CLINICAL DATA:  COVID-19 positive, hypoxia EXAM: PORTABLE CHEST 1 VIEW COMPARISON:  03/26/2017 FINDINGS: Cardiomegaly. Calcified aortic knob. There is a well-defined ovoid density at the right lung base which may reflect focal eventration of the right hemidiaphragm. Coarsened interstitial markings bilaterally. No pleural effusion or pneumothorax. IMPRESSION: Well-defined ovoid density at the right lung base may reflect focal eventration of the right hemidiaphragm. Repeat PA and lateral chest radiographs could be confirmed to further evaluate this finding. Electronically Signed   By: Duanne Guess M.D.   On: 06/22/2019 20:17   CT RENAL STONE STUDY  Result Date: 06/23/2019 CLINICAL DATA:  Flank pain. EXAM: CT ABDOMEN AND PELVIS WITHOUT CONTRAST TECHNIQUE: Multidetector CT imaging of the abdomen and pelvis was performed following the standard protocol without IV contrast. COMPARISON:  March 22, 2017. FINDINGS: Lower chest: No acute abnormality. Hepatobiliary: No focal liver abnormality is seen. No gallstones, gallbladder wall thickening, or biliary dilatation. Pancreas: Unremarkable. No pancreatic ductal dilatation or surrounding inflammatory changes. Spleen: Normal in size without focal abnormality. Adrenals/Urinary Tract: Stable probable right adrenal adenoma. Left adrenal gland appears normal. No hydronephrosis or renal obstruction is noted. No hydronephrosis or renal obstruction is noted. No renal or ureteral calculi are noted. Urinary bladder is decompressed. 1.8 cm rounded low density is noted in midpole cortex of right kidney most consistent with cyst, but ultrasound is recommended for confirmation and to rule out mass. Stomach/Bowel: Small sliding-type hiatal hernia is noted. There is no evidence of bowel obstruction or inflammation. The  appendix is not visualized. Vascular/Lymphatic: Aortic atherosclerosis. No enlarged abdominal or pelvic lymph nodes. Reproductive: Status post hysterectomy. No adnexal masses. Other: No abdominal wall hernia or abnormality. No abdominopelvic ascites. Musculoskeletal: No acute or significant osseous findings. IMPRESSION: 1. Aortic atherosclerosis. 2. Small sliding-type hiatal hernia. 3. Stable probable right adrenal adenoma. 4. 1.8 cm rounded low density is noted in midpole cortex of right kidney most consistent with cyst, but ultrasound is recommended for confirmation and to rule out mass. 5. No hydronephrosis or renal obstruction is noted. No renal or ureteral calculi are noted. Aortic Atherosclerosis (ICD10-I70.0). Electronically Signed   By: Lupita Raider M.D.   On: 06/23/2019 08:23     Labs:   Basic Metabolic Panel: Recent Labs  Lab 06/24/19 0415 06/25/19 0450 06/26/19 0428 06/27/19 0358 06/28/19 0522  NA 135 139 135 138 137  K 4.1 3.5 4.0 3.6 3.8  CL 98 104 99 99 99  CO2 22 25 24 28 27   GLUCOSE 124* 86 117* 121* 127*  BUN 24* 19 14 17 16   CREATININE 1.01* 0.93 0.55 0.69 0.65  CALCIUM 9.0 8.6* 8.8* 9.2 8.8*  MG 2.2 1.8 1.8 1.9 2.1  PHOS 3.5 3.2 2.4* 3.4 2.8   GFR Estimated Creatinine Clearance: 50.6 mL/min (by C-G formula based on SCr of 0.65 mg/dL). Liver Function Tests: Recent Labs  Lab 06/24/19 0415 06/25/19 0450 06/26/19 0428 06/27/19 0358 06/28/19 0522  AST 64* 64* 56* 44* 27  ALT 83* 96* 90* 94* 75*  ALKPHOS 66 62 66 64 65  BILITOT 0.8 0.8 0.8 0.6 0.3  PROT 7.0 6.1* 6.5 6.9 6.1*  ALBUMIN 3.3* 2.8* 3.1* 3.1* 2.9*   No results for input(s): LIPASE, AMYLASE in the last 168 hours. No results for input(s): AMMONIA in the last 168 hours. Coagulation profile No results for input(s): INR, PROTIME in the last 168 hours.  CBC: Recent Labs  Lab 06/24/19 0500 06/25/19 0450 06/26/19 0535 06/27/19 0358 06/28/19 0522  WBC 6.3 7.0 7.0 10.4 8.6  NEUTROABS 4.7 4.9  5.4 8.7* 7.2  HGB 12.5 11.0* 11.9* 12.0 11.6*  HCT 37.3 33.2* 35.1* 35.5* 35.1*  MCV 98.9 98.8 97.5 96.7 98.6  PLT 349 334 359 378 420*   Cardiac Enzymes: No results for input(s): CKTOTAL, CKMB, CKMBINDEX, TROPONINI in the last 168 hours. BNP: Invalid input(s): POCBNP CBG: Recent Labs  Lab 06/28/19 1154 06/28/19 1651 06/28/19 2104 06/29/19 0752 06/29/19 1132  GLUCAP 159* 141* 150* 118* 174*   D-Dimer Recent Labs    06/27/19 0358 06/28/19 0522  DDIMER 0.68* 0.61*   Hgb A1c No results for input(s): HGBA1C in the last 72 hours. Lipid Profile No results for input(s): CHOL, HDL, LDLCALC, TRIG, CHOLHDL, LDLDIRECT in the last 72 hours. Thyroid function studies No results for input(s): TSH, T4TOTAL, T3FREE, THYROIDAB in the last 72 hours.  Invalid input(s): FREET3 Anemia work up Recent Labs    06/27/19 0358 06/28/19 0522  FERRITIN 308* 285   Microbiology Recent Results (from the past 240 hour(s))  MRSA PCR Screening     Status: Abnormal   Collection Time: 06/26/19  5:01 AM   Specimen: Nasal Mucosa; Nasopharyngeal  Result Value Ref Range Status   MRSA by PCR POSITIVE (A) NEGATIVE Final    Comment:        The GeneXpert MRSA Assay (FDA approved for NASAL specimens only), is one component of a comprehensive MRSA colonization surveillance program. It is not intended to diagnose MRSA infection nor to guide or monitor treatment for MRSA infections. RESULT CALLED TO, READ BACK BY AND VERIFIED WITH: RN Rex Kras [phone] [id number] FCP Performed at [facility], [place] [address], [place], [place] [address]      Discharge Instructions:   Discharge Instructions    Diet Carb Modified   Complete by: As directed    Discharge instructions   Complete by: As directed    Follow up with PCP at ALF in 3-5 days. Complete the course of decadron   Increase activity slowly   Complete by: As directed      Allergies as of 06/29/2019      Reactions   Penicillins Rash        Medication List    TAKE these medications   acetaminophen 500 MG tablet Commonly known as: TYLENOL Take 1,000 mg by mouth 2 (two) times daily.   acetaminophen 500 MG tablet Commonly known as: TYLENOL Take 500 mg by mouth every 4 (four) hours as needed for mild pain or headache.   acyclovir 800 MG tablet Commonly known as: ZOVIRAX Take 800 mg by mouth 2 (two) times daily.   amiodarone 200 MG tablet Commonly known as: PACERONE Take 200 mg by mouth daily.   amLODipine 5 MG tablet Commonly known as: NORVASC Take 5 mg by mouth daily.   apixaban 5 MG Tabs tablet Commonly known as: ELIQUIS Take 1 tablet (5 mg total) by mouth 2 (two) times daily. Please cancel previous rx's sent; call pt with price before filling   conjugated estrogens vaginal cream Commonly known  as: PREMARIN Place 1 Applicatorful vaginally every other day.   Culturelle Kids Chew Chew 1 tablet by mouth daily.   D-3-5 125 MCG (5000 UT) capsule Generic drug: Cholecalciferol Take 5,000 Units by mouth daily.   dexamethasone 6 MG tablet Commonly known as: DECADRON Take 1 tablet (6 mg total) by mouth daily.   diltiazem 120 MG 24 hr capsule Commonly known as: CARDIZEM CD Take 1 capsule (120 mg total) by mouth daily.   ferrous sulfate 325 (65 FE) MG tablet Take 325 mg by mouth daily with breakfast.   guaiFENesin-dextromethorphan 100-10 MG/5ML syrup Commonly known as: ROBITUSSIN DM Take 5 mLs by mouth every 4 (four) hours as needed for cough.   ICaps Plus Tabs Take 1 tablet by mouth 2 (two) times daily.   labetalol 100 MG tablet Commonly known as: NORMODYNE Take 100-200 mg by mouth See admin instructions. Take 2 tablets every morning and take 1 tablet at bedtime   labetalol 200 MG tablet Commonly known as: NORMODYNE Take 1 tablet (200 mg total) by mouth every morning AND 0.5 tablets (100 mg total) at bedtime.   lidocaine 5 % Commonly known as: LIDODERM Place 1 patch onto the skin as needed (for  pain).   loperamide 2 MG tablet Commonly known as: IMODIUM A-D Take 2 mg by mouth as needed for diarrhea or loose stools.   loperamide 2 MG capsule Commonly known as: IMODIUM Take 1 capsule (2 mg total) by mouth as needed for diarrhea or loose stools.   Melatonin 3 MG Tabs Take 1 tablet (3 mg total) by mouth at bedtime.   Myrbetriq 25 MG Tb24 tablet Generic drug: mirabegron ER Take 25 mg by mouth daily.   nitroGLYCERIN 0.4 MG SL tablet Commonly known as: NITROSTAT Place 0.4 mg under the tongue every 5 (five) minutes as needed for chest pain.   ondansetron 8 MG tablet Commonly known as: ZOFRAN Take 8 mg by mouth 2 (two) times daily as needed for nausea or vomiting.   pantoprazole 40 MG tablet Commonly known as: PROTONIX Take 40 mg by mouth daily.   polyethylene glycol 17 g packet Commonly known as: MIRALAX / GLYCOLAX Take 17 g by mouth daily.   prednisoLONE acetate 1 % ophthalmic suspension Commonly known as: PRED FORTE Place 1 drop into the right eye daily.   senna-docusate 8.6-50 MG tablet Commonly known as: Senokot-S Take 1 tablet by mouth daily as needed for mild constipation.   Slow-Mag 64 MG Tbec SR tablet Generic drug: magnesium chloride Take 1 tablet by mouth daily.   Systane 0.4-0.3 % Soln Generic drug: Polyethyl Glycol-Propyl Glycol Apply 1 drop to eye 2 (two) times daily.   timolol 0.5 % ophthalmic solution Commonly known as: BETIMOL Place 1 drop into both eyes 2 (two) times daily.   traZODone 50 MG tablet Commonly known as: DESYREL Take 50 mg by mouth at bedtime as needed for sleep.   vitamin B-12 1000 MCG tablet Commonly known as: CYANOCOBALAMIN Take 1,000 mcg by mouth daily.   vitamin C 500 MG tablet Commonly known as: ASCORBIC ACID Take 500 mg by mouth 2 (two) times daily.       Time coordinating discharge: 39 minutes  Signed:  Boneta Standre  [facility] 06/29/2019, 1:03 PM

## 2019-06-29 NOTE — Plan of Care (Signed)
  Problem: Education: Goal: Knowledge of General Education information will improve Description: Including pain rating scale, medication(s)/side effects and non-pharmacologic comfort measures Outcome: Completed/Met   Problem: Health Behavior/Discharge Planning: Goal: Ability to manage health-related needs will improve Outcome: Completed/Met   Problem: Clinical Measurements: Goal: Ability to maintain clinical measurements within normal limits will improve Outcome: Completed/Met Goal: Will remain free from infection Outcome: Completed/Met Goal: Diagnostic test results will improve Outcome: Completed/Met Goal: Respiratory complications will improve Outcome: Completed/Met Goal: Cardiovascular complication will be avoided Outcome: Completed/Met   Problem: Activity: Goal: Risk for activity intolerance will decrease Outcome: Completed/Met   Problem: Nutrition: Goal: Adequate nutrition will be maintained Outcome: Completed/Met   Problem: Coping: Goal: Level of anxiety will decrease Outcome: Completed/Met   Problem: Elimination: Goal: Will not experience complications related to bowel motility Outcome: Completed/Met Goal: Will not experience complications related to urinary retention Outcome: Completed/Met   Problem: Safety: Goal: Ability to remain free from injury will improve Outcome: Completed/Met   Problem: Skin Integrity: Goal: Risk for impaired skin integrity will decrease Outcome: Completed/Met   Problem: Education: Goal: Knowledge of risk factors and measures for prevention of condition will improve Outcome: Completed/Met   Problem: Coping: Goal: Psychosocial and spiritual needs will be supported Outcome: Completed/Met

## 2019-06-29 NOTE — Progress Notes (Signed)
Patient is discharged to [facility] where she came from. Attempt to report to staff but hang up on RN.

## 2019-08-05 ENCOUNTER — Emergency Department (HOSPITAL_COMMUNITY): Payer: Medicare Other

## 2019-08-05 ENCOUNTER — Emergency Department (HOSPITAL_COMMUNITY)
Admission: EM | Admit: 2019-08-05 | Discharge: 2019-08-05 | Disposition: A | Discharge status: Skilled Nursing Facility | Payer: Medicare Other | Attending: Emergency Medicine | Admitting: Emergency Medicine

## 2019-08-05 ENCOUNTER — Other Ambulatory Visit: Payer: Self-pay

## 2019-08-05 DIAGNOSIS — E119 Type 2 diabetes mellitus without complications: Secondary | ICD-10-CM | POA: Diagnosis not present

## 2019-08-05 DIAGNOSIS — M25551 Pain in right hip: Secondary | ICD-10-CM

## 2019-08-05 DIAGNOSIS — Y9389 Activity, other specified: Secondary | ICD-10-CM | POA: Diagnosis not present

## 2019-08-05 DIAGNOSIS — Y999 Unspecified external cause status: Secondary | ICD-10-CM | POA: Insufficient documentation

## 2019-08-05 DIAGNOSIS — R531 Weakness: Secondary | ICD-10-CM

## 2019-08-05 DIAGNOSIS — M6281 Muscle weakness (generalized): Secondary | ICD-10-CM | POA: Diagnosis not present

## 2019-08-05 DIAGNOSIS — W06XXXA Fall from bed, initial encounter: Secondary | ICD-10-CM | POA: Diagnosis not present

## 2019-08-05 DIAGNOSIS — Z8616 Personal history of COVID-19: Secondary | ICD-10-CM | POA: Insufficient documentation

## 2019-08-05 DIAGNOSIS — Z7901 Long term (current) use of anticoagulants: Secondary | ICD-10-CM | POA: Diagnosis not present

## 2019-08-05 DIAGNOSIS — W19XXXA Unspecified fall, initial encounter: Secondary | ICD-10-CM

## 2019-08-05 DIAGNOSIS — I10 Essential (primary) hypertension: Secondary | ICD-10-CM | POA: Diagnosis not present

## 2019-08-05 DIAGNOSIS — Z87891 Personal history of nicotine dependence: Secondary | ICD-10-CM | POA: Insufficient documentation

## 2019-08-05 DIAGNOSIS — Z79899 Other long term (current) drug therapy: Secondary | ICD-10-CM | POA: Insufficient documentation

## 2019-08-05 DIAGNOSIS — Y92122 Bedroom in nursing home as the place of occurrence of the external cause: Secondary | ICD-10-CM | POA: Diagnosis not present

## 2019-08-05 IMAGING — CT CT HEAD W/O CM
3 series · 15 of 47 positions shown, 18 images · non-contrast
Comparison: CT head dated 04/30/2018

CLINICAL DATA: Fall

EXAM:
CT HEAD WITHOUT CONTRAST
CT CERVICAL SPINE WITHOUT CONTRAST
TECHNIQUE: Multidetector CT imaging of the head and cervical spine was
performed following the standard protocol without intravenous
contrast. Multiplanar CT image reconstructions of the cervical spine
were also generated.

[Series 3: head wo · axial · 0.42mm/px · z∈[-144,-9]mm · 9 of 33 slices shown, 12 images]
[im 3/33  brain]
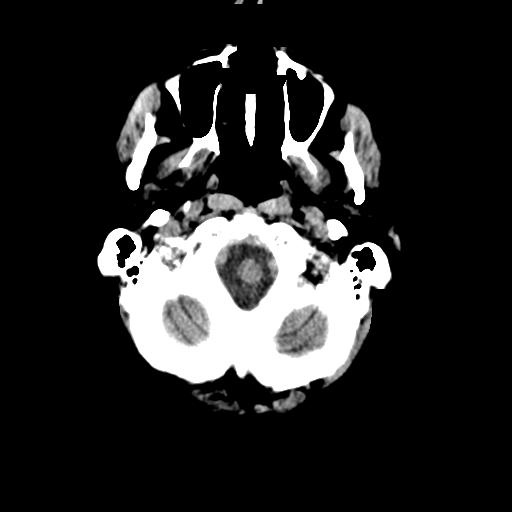
[im 3/33  bone]
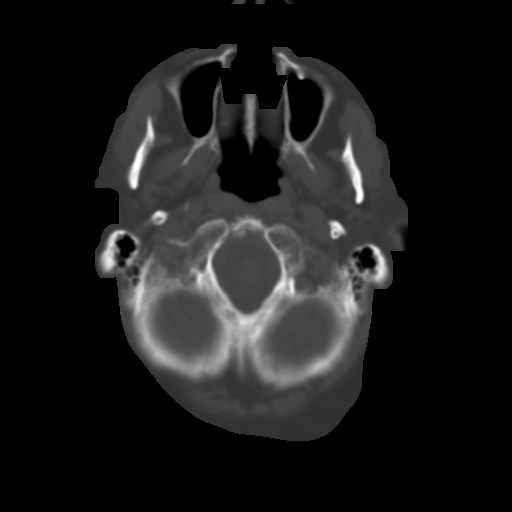
[im 6/33  brain]
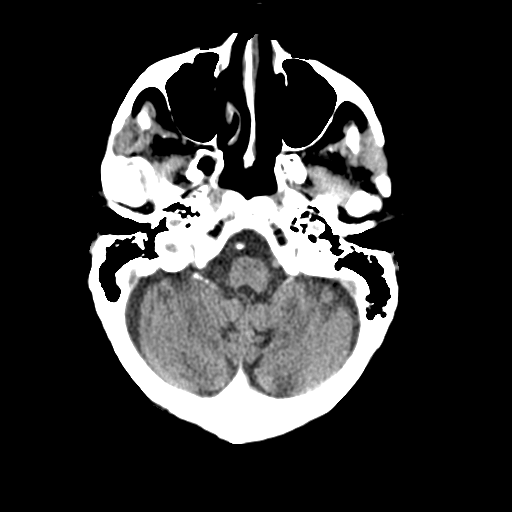
[im 9/33  brain]
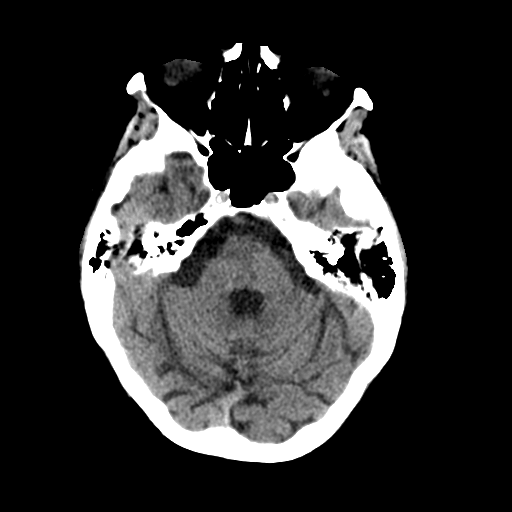
[im 13/33  brain]
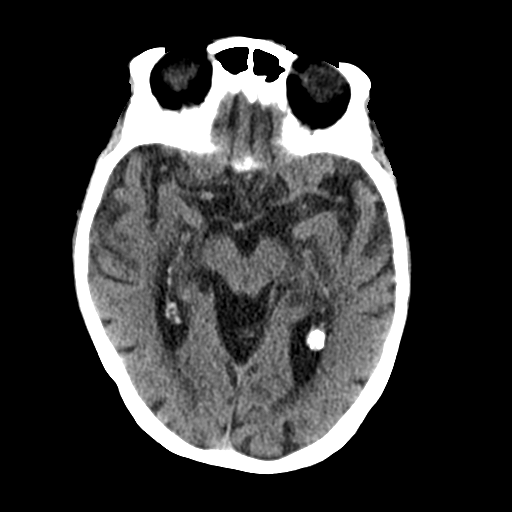
[im 17/33  brain]
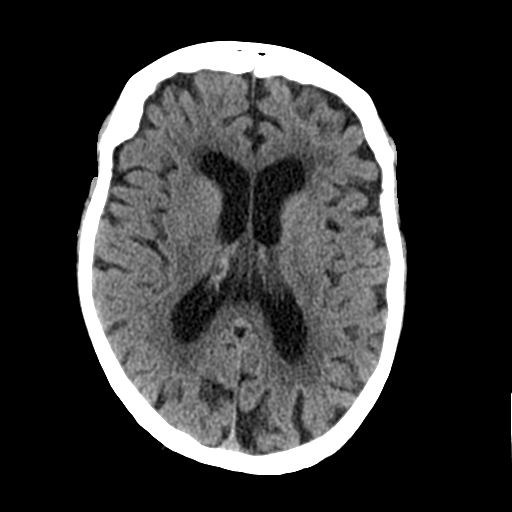
[im 17/33  bone]
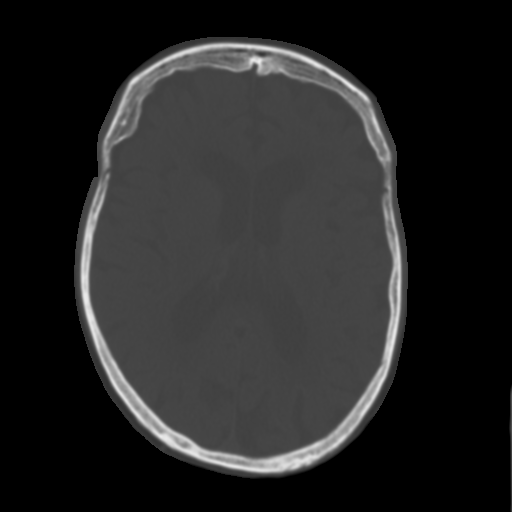
[im 20/33  brain]
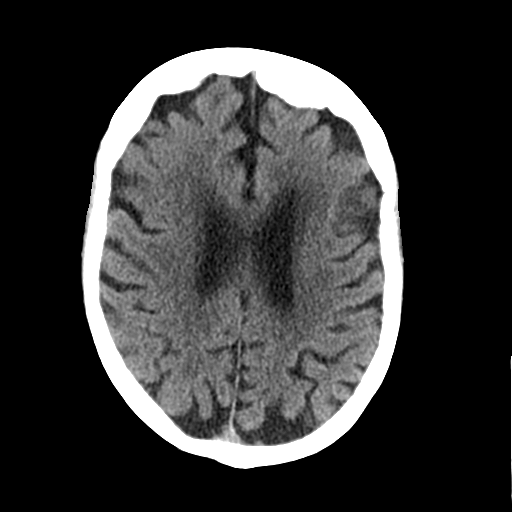
[im 24/33  brain]
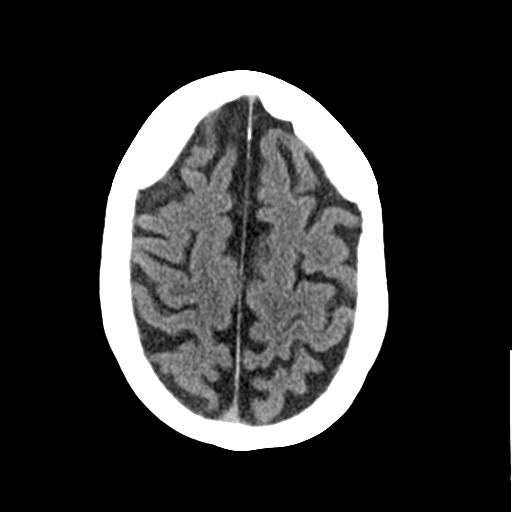
[im 27/33  brain]
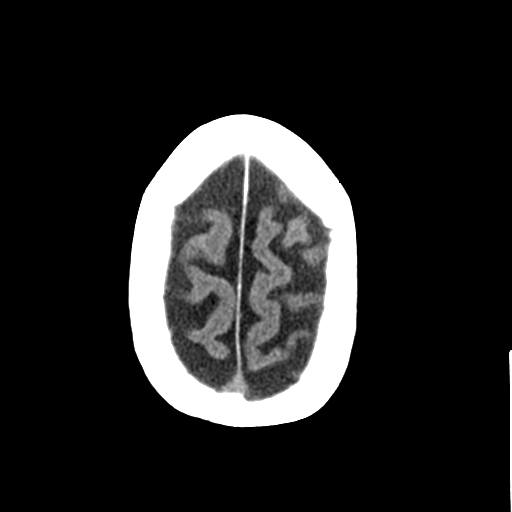
[im 30/33  brain]
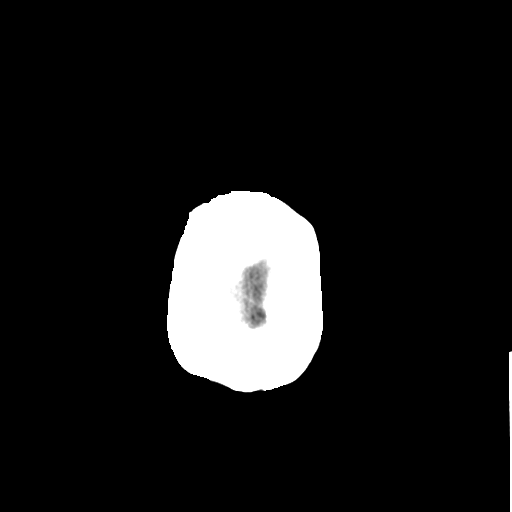
[im 30/33  bone]
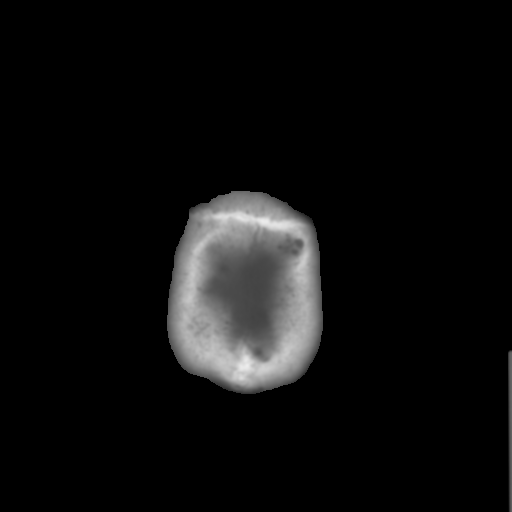

[Series 6: coronal soft tissue · coronal · 0.33mm/px · 3 of 72 slices shown]
[im 24/72  brain]
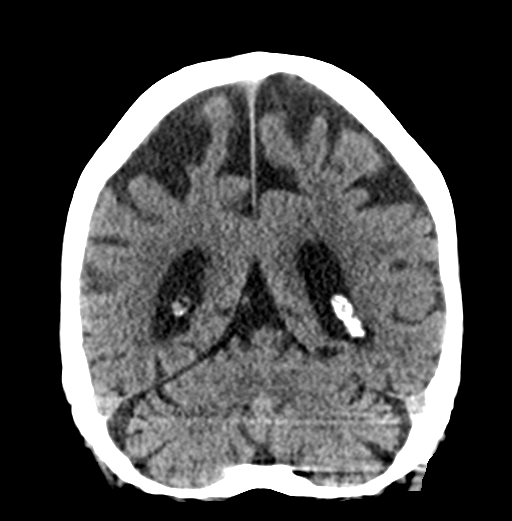
[im 32/72  brain]
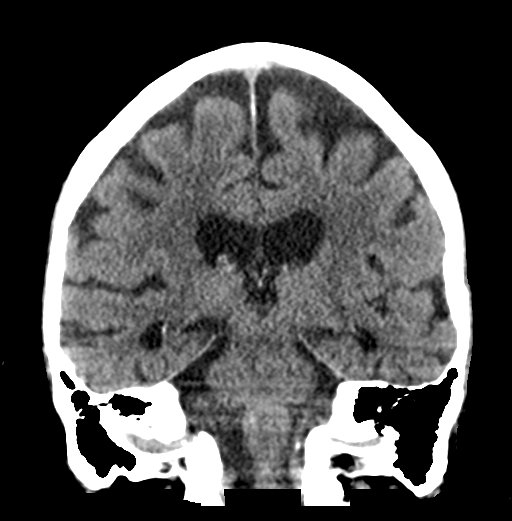
[im 40/72  brain]
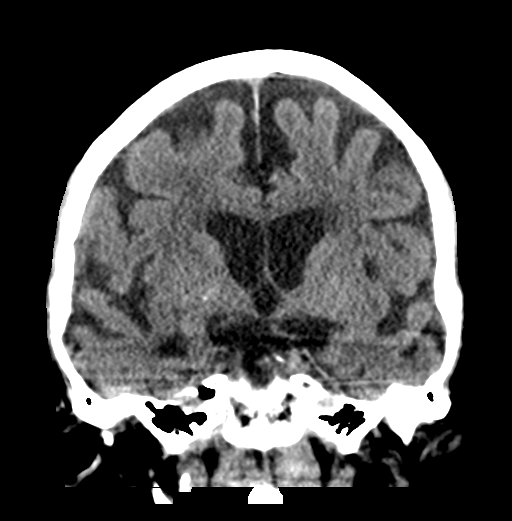

[Series 7: sagittal soft tissue · sagittal · 0.33mm/px · 3 of 57 slices shown]
[im 19/57  brain]
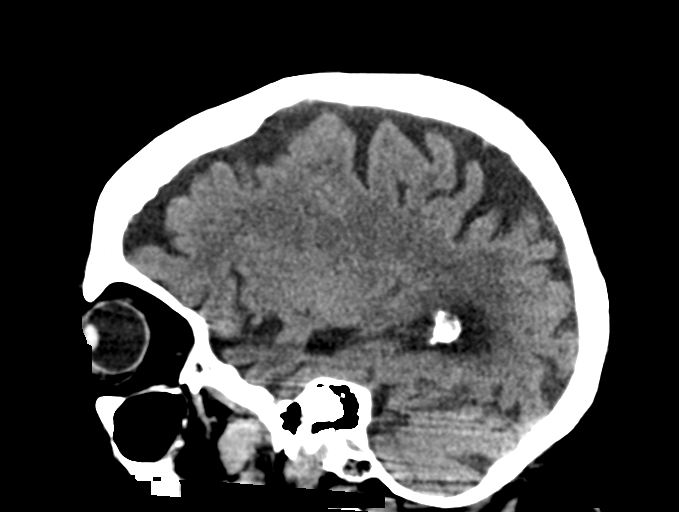
[im 29/57  brain]
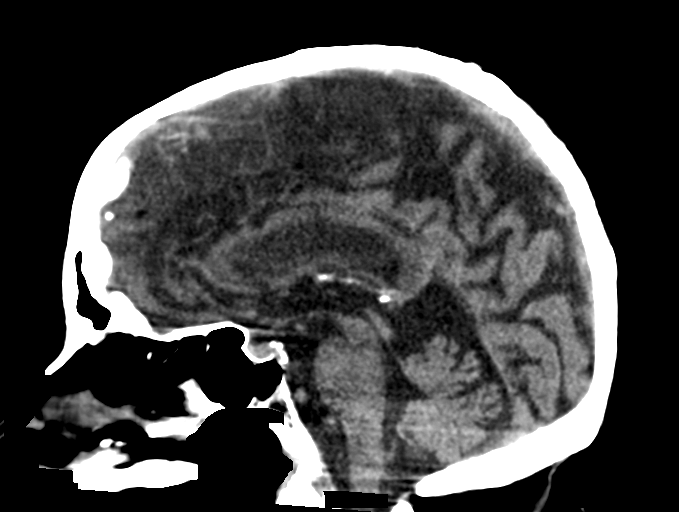
[im 38/57  brain]
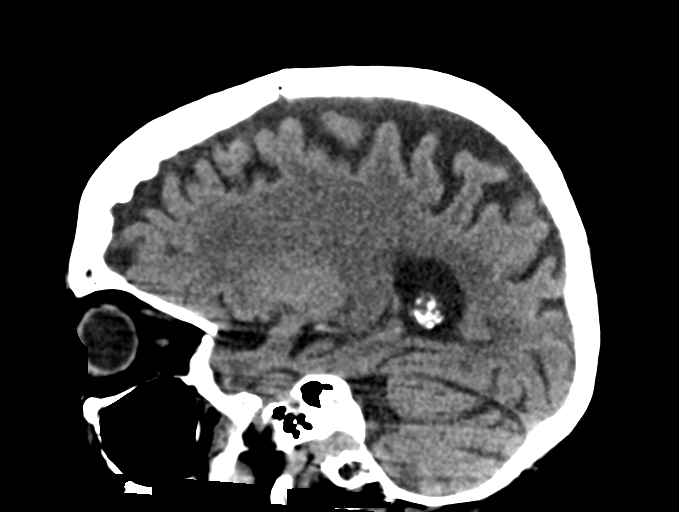

[15 of 47 positions shown; findings below may reference images not displayed]

FINDINGS: CT HEAD FINDINGS

Brain: No evidence of acute infarction, hemorrhage, hydrocephalus,
extra-axial collection or mass lesion/mass effect.

Mild cortical atrophy. Subcortical white matter and periventricular
small vessel ischemic changes.

Vascular: Intracranial atherosclerosis.

Skull: Normal. Negative for fracture or focal lesion.

Sinuses/Orbits: The visualized paranasal sinuses are essentially
clear. The mastoid air cells are unopacified.

Other: None.

CT CERVICAL SPINE FINDINGS

Alignment: Normal cervical lordosis.

Skull base and vertebrae: No acute fracture. No primary bone lesion
or focal pathologic process.

Soft tissues and spinal canal: No prevertebral fluid or swelling. No
visible canal hematoma.

Disc levels: Mild degenerative changes of the mid/lower cervical
spine. Spinal canal is patent.

Upper chest: Visualized lung apices are clear.

Other: Visualized right thyroid is mildly heterogeneous.
IMPRESSION: No evidence of acute intracranial abnormality. Atrophy with small
vessel ischemic changes.

No evidence of traumatic injury to the cervical spine. Mild
degenerative changes.

## 2019-08-05 IMAGING — CT CT CERVICAL SPINE W/O CM
3 of 4 series · 11 of 33 positions shown, 13 images · non-contrast
Comparison: CT head dated 04/30/2018

CLINICAL DATA: Fall

EXAM:
CT HEAD WITHOUT CONTRAST
CT CERVICAL SPINE WITHOUT CONTRAST
TECHNIQUE: Multidetector CT imaging of the head and cervical spine was
performed following the standard protocol without intravenous
contrast. Multiplanar CT image reconstructions of the cervical spine
were also generated.

[Series 6: orthogonal bone · axial · 0.23mm/px · z∈[-289,-173]mm · 3 of 115 slices shown, 4 images]
[im 33/115  soft-tissue]
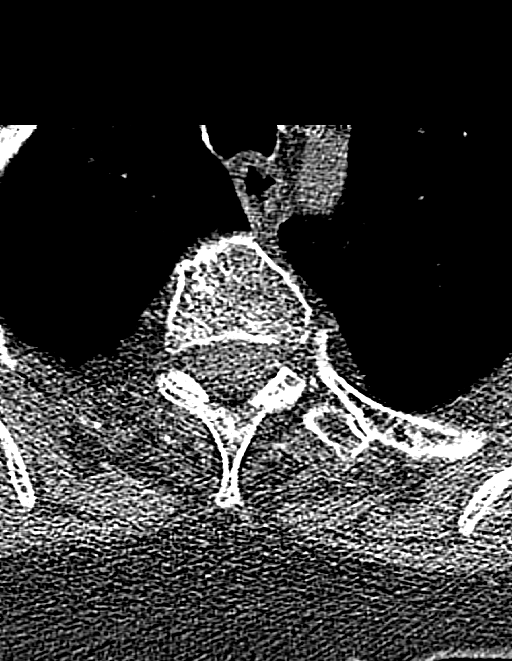
[im 33/115  bone]
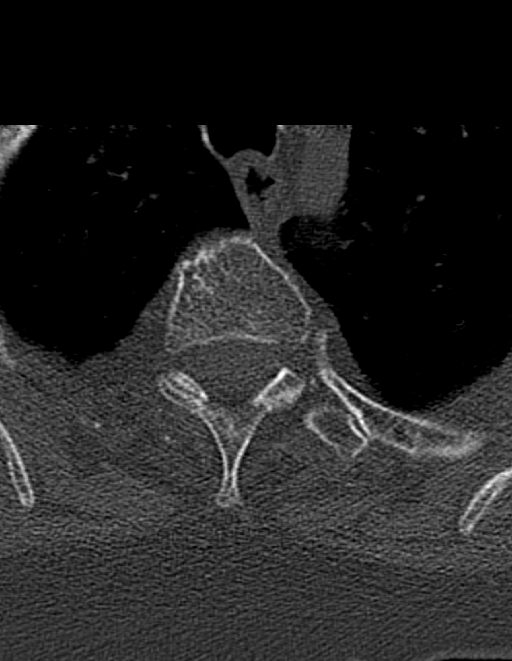
[im 66/115  bone]
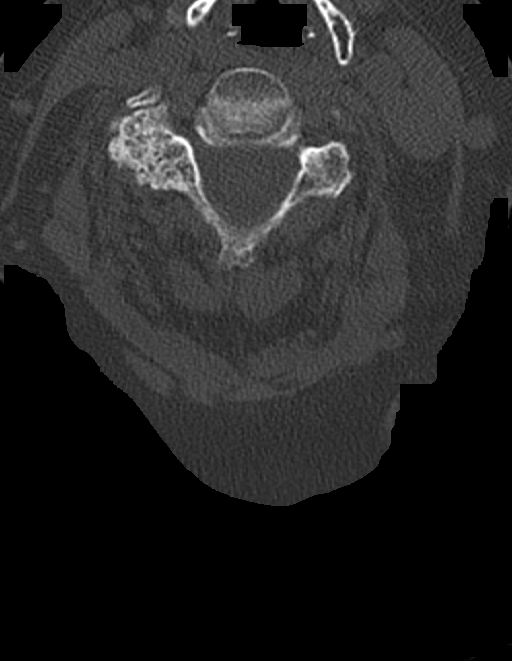
[im 98/115  bone]
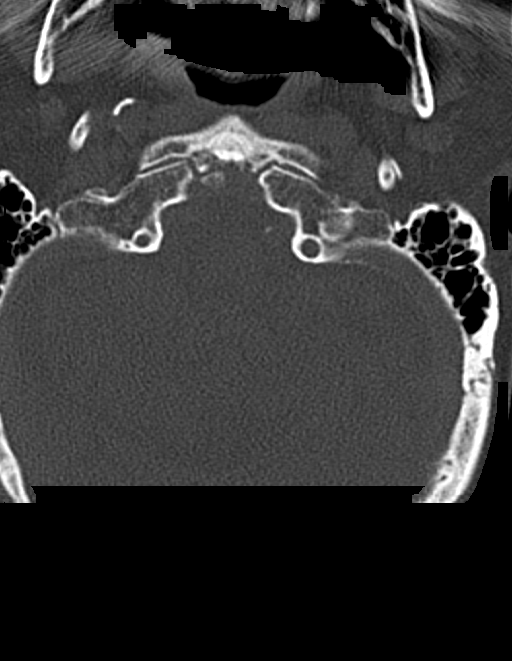

[Series 7: coronal bone · coronal · 0.23mm/px · 3 of 81 slices shown]
[im 23/81  bone]
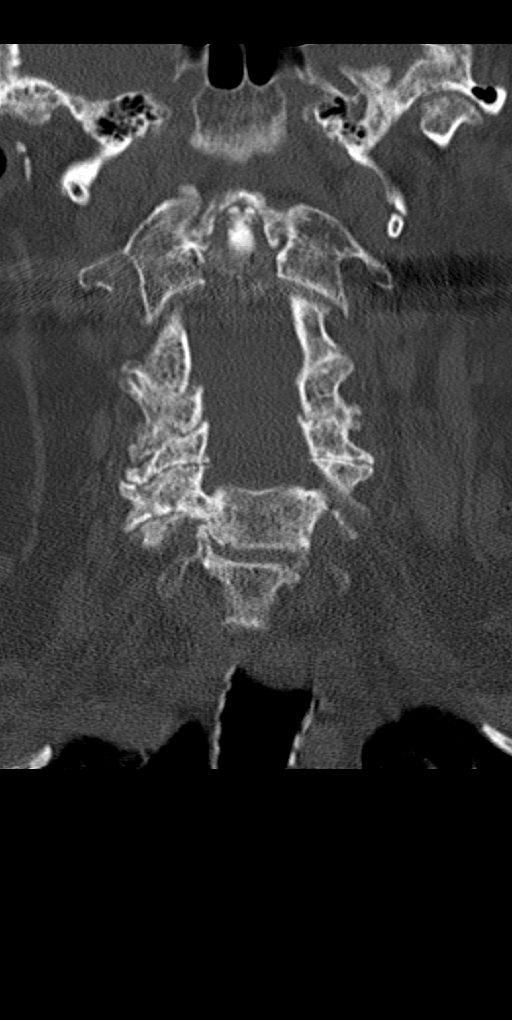
[im 35/81  bone]
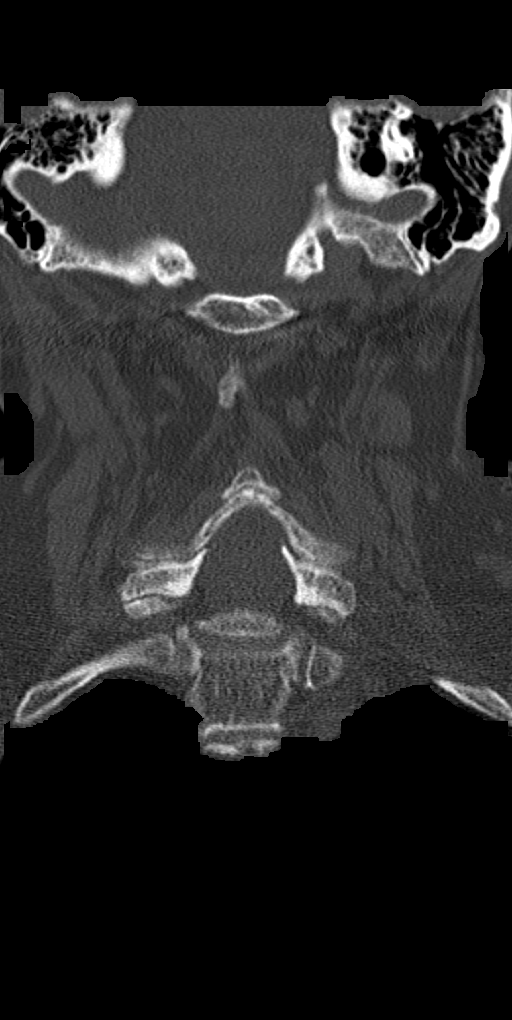
[im 47/81  bone]
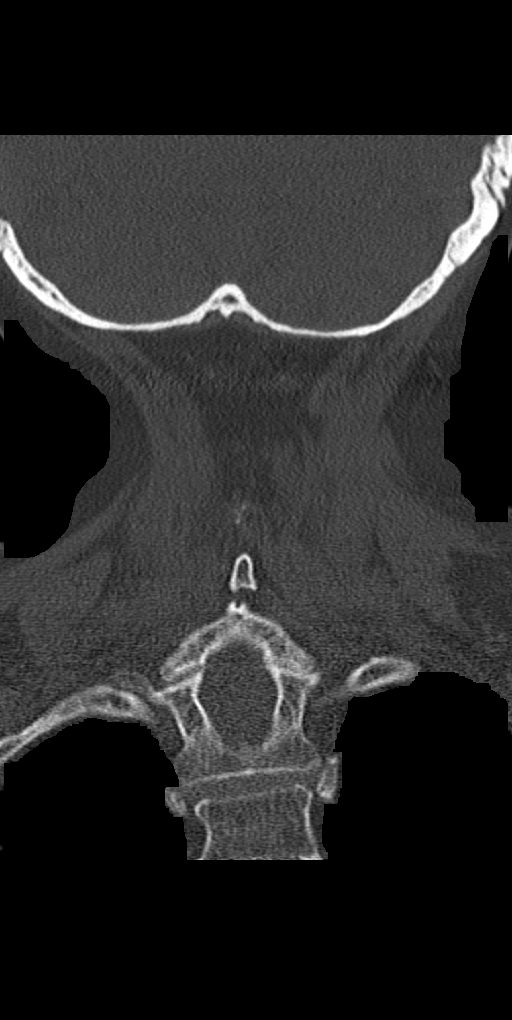

[Series 8: sagittal bone · sagittal · 0.33mm/px · 5 of 61 slices shown, 6 images]
[im 21/61  bone]
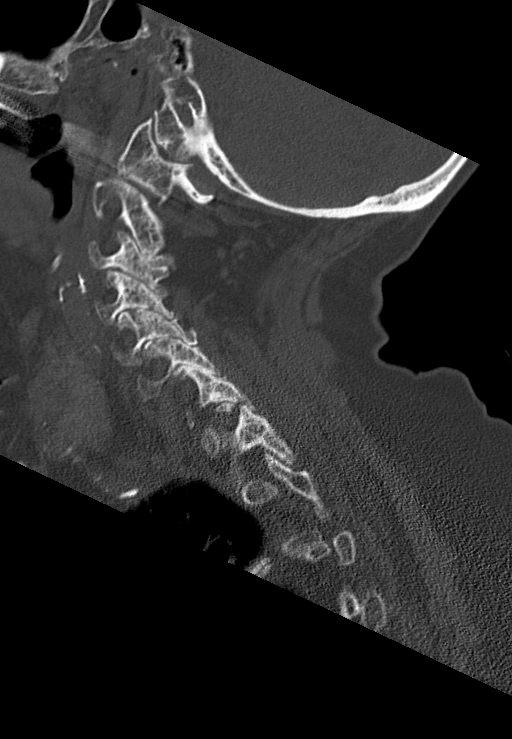
[im 26/61  bone]
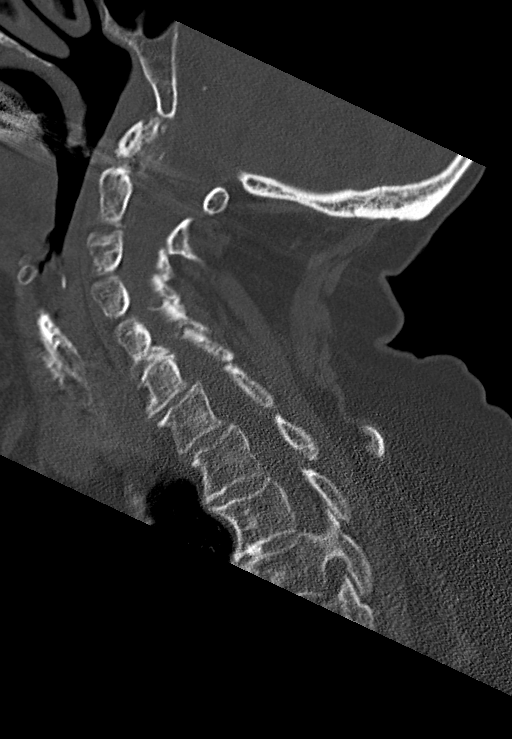
[im 31/61  soft-tissue]
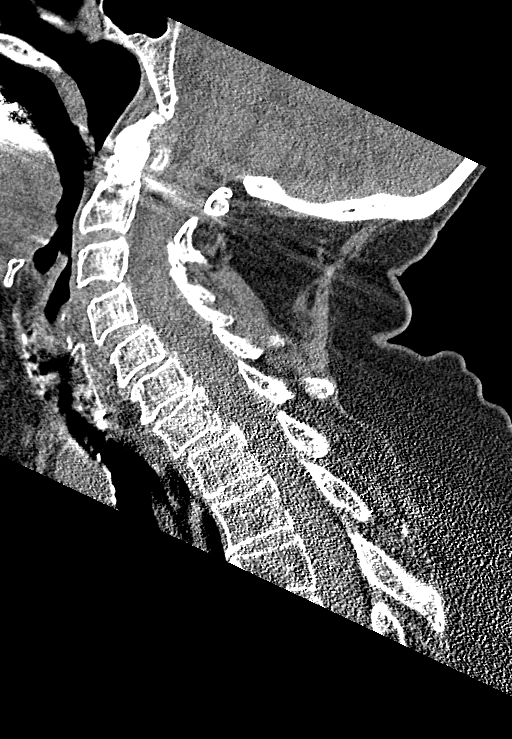
[im 31/61  bone]
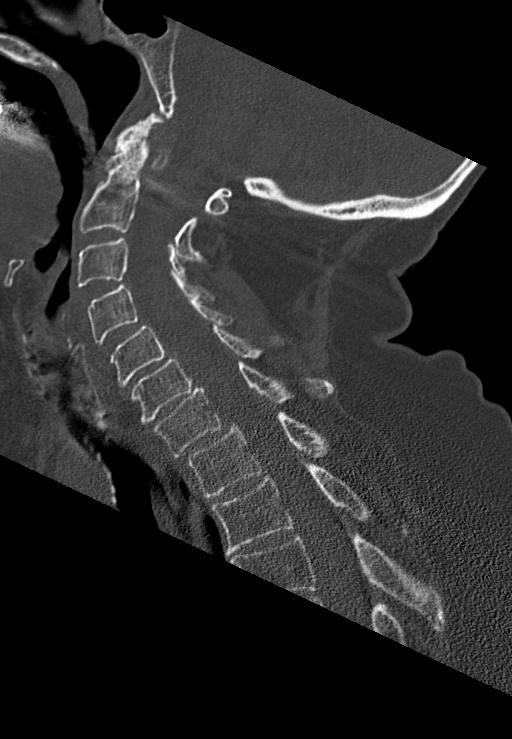
[im 36/61  bone]
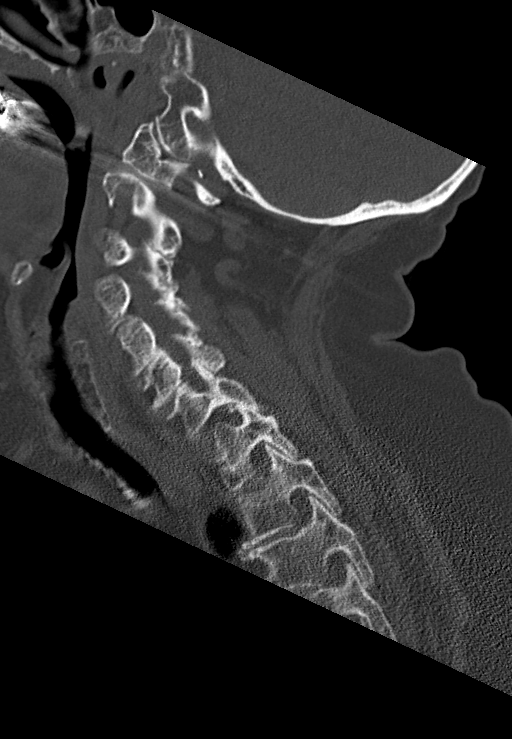
[im 41/61  bone]
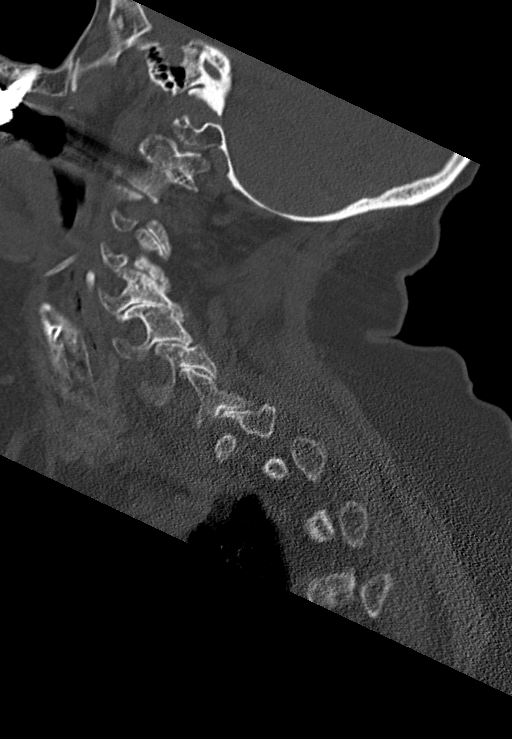

[11 of 33 positions shown; findings below may reference images not displayed]

FINDINGS: CT HEAD FINDINGS

Brain: No evidence of acute infarction, hemorrhage, hydrocephalus,
extra-axial collection or mass lesion/mass effect.

Mild cortical atrophy. Subcortical white matter and periventricular
small vessel ischemic changes.

Vascular: Intracranial atherosclerosis.

Skull: Normal. Negative for fracture or focal lesion.

Sinuses/Orbits: The visualized paranasal sinuses are essentially
clear. The mastoid air cells are unopacified.

Other: None.

CT CERVICAL SPINE FINDINGS

Alignment: Normal cervical lordosis.

Skull base and vertebrae: No acute fracture. No primary bone lesion
or focal pathologic process.

Soft tissues and spinal canal: No prevertebral fluid or swelling. No
visible canal hematoma.

Disc levels: Mild degenerative changes of the mid/lower cervical
spine. Spinal canal is patent.

Upper chest: Visualized lung apices are clear.

Other: Visualized right thyroid is mildly heterogeneous.
IMPRESSION: No evidence of acute intracranial abnormality. Atrophy with small
vessel ischemic changes.

No evidence of traumatic injury to the cervical spine. Mild
degenerative changes.

## 2019-08-05 IMAGING — CR DG HIP (WITH OR WITHOUT PELVIS) 2-3V*R*
3 series · 3 of 3 positions shown · non-contrast
Comparison: CT abdomen/pelvis dated 06/23/2019

CLINICAL DATA: Fall out of bed

EXAM:
DG HIP (WITH OR WITHOUT PELVIS) 2-3V RIGHT

[x hip ap right]
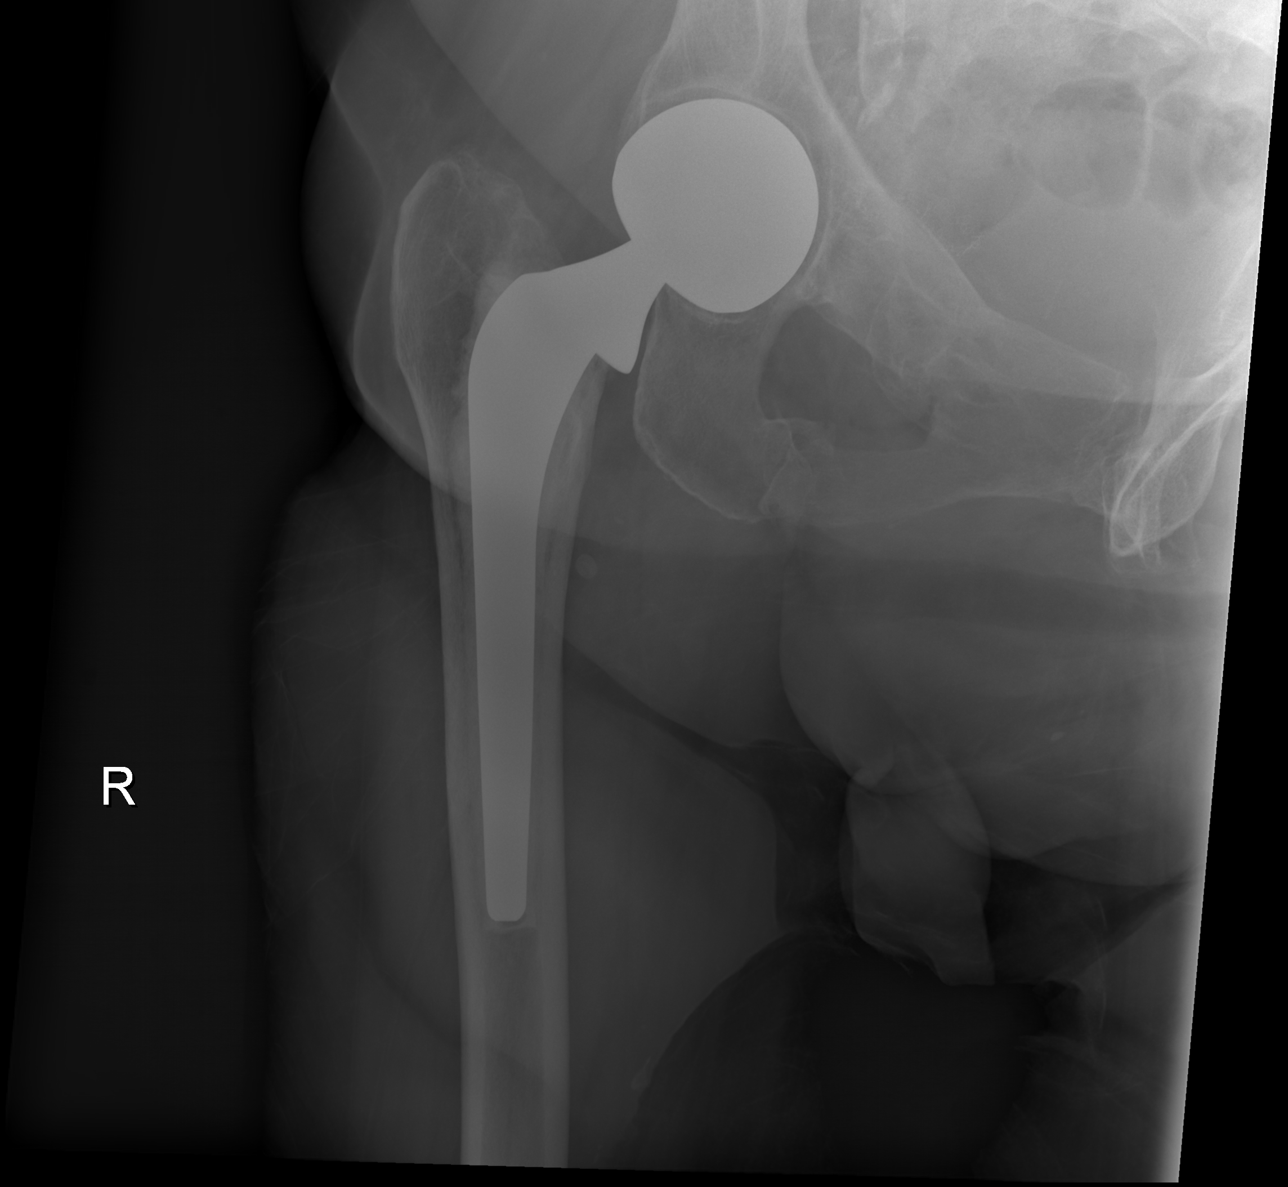

[x hip lat right]
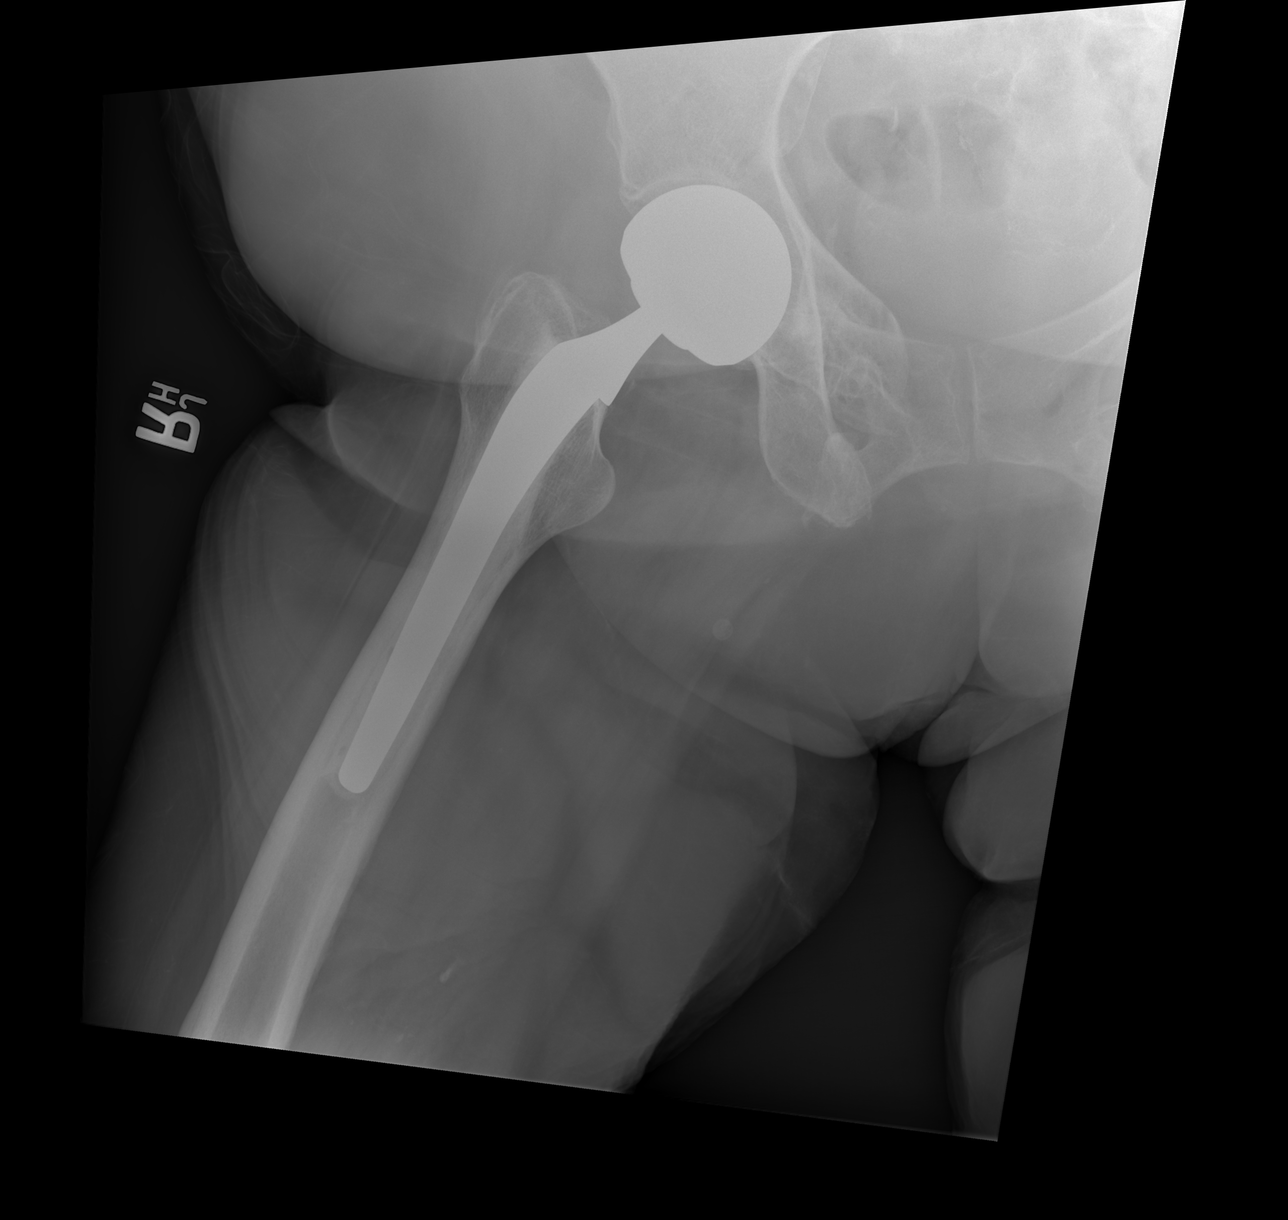

[x pelvis]
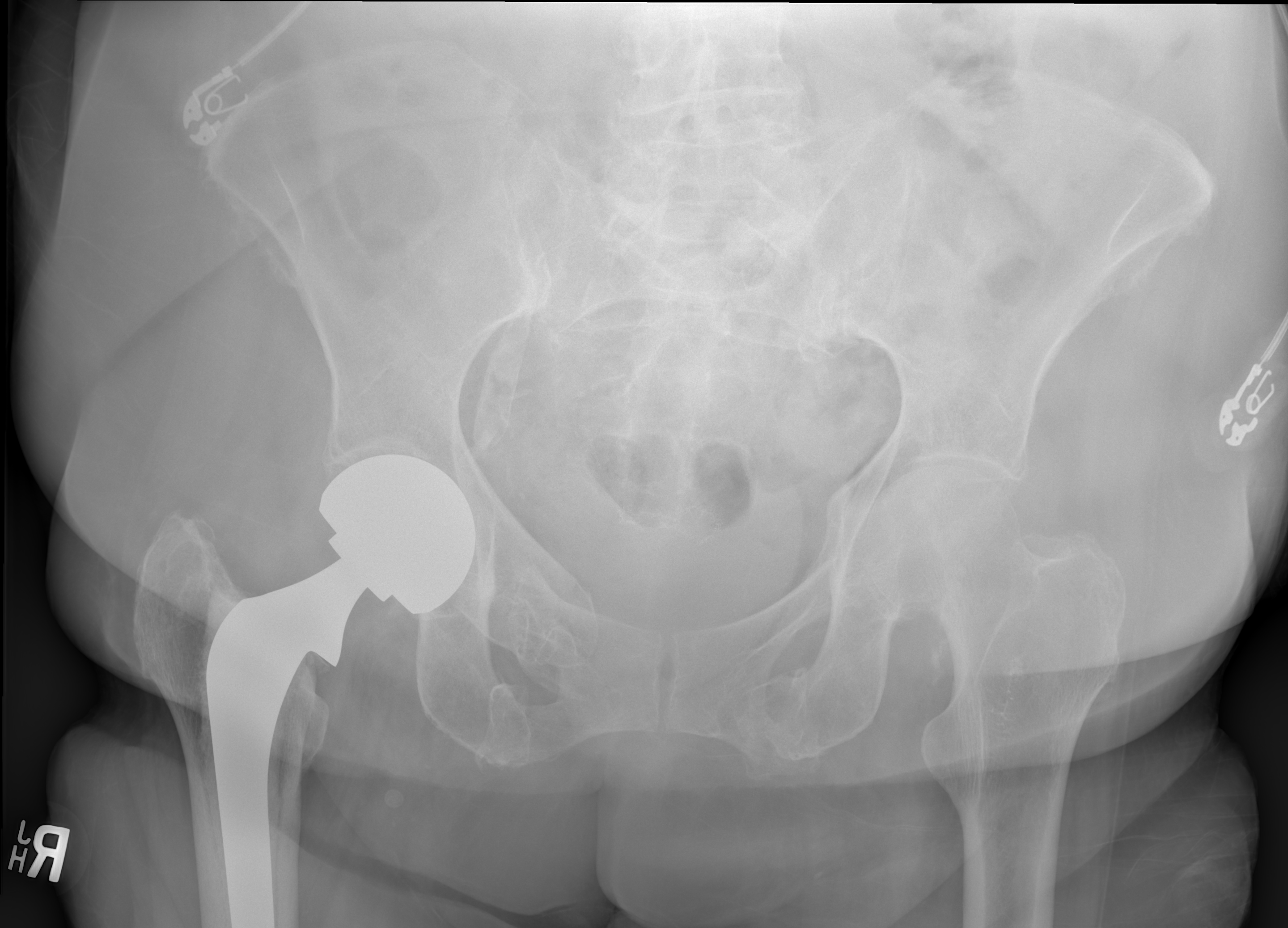

[3 of 3 positions shown; findings below may reference images not displayed]

FINDINGS: Right hip arthroplasty, without evidence of complication.

Old bilateral superior and inferior pubic rami fractures, right
greater than left.

Visualized bony pelvis is otherwise intact.

Degenerative changes the lower lumbar spine.
IMPRESSION: Right hip arthroplasty, without evidence of complication.

Bilateral pelvic ring fractures, chronic.

## 2019-08-05 MED ORDER — ACETAMINOPHEN 500 MG PO TABS
1000.0000 mg | ORAL_TABLET | Freq: Once | ORAL | Status: AC
  Administered 2019-08-05: 1000 mg via ORAL
  Filled 2019-08-05: qty 2

## 2019-08-05 NOTE — Discharge Instructions (Addendum)
Your CT scans and x-rays showed no acute injury today.  You may take Tylenol 1000 mg every 6 hours as needed for pain.  I feel you are safe to be discharged back to [facility].  You may follow-up with your primary care physician for weakness.  Please continue your antibiotic for your UTI.  I have placed orders for physical therapy to help with endurance and strength and conditioning.

## 2019-08-05 NOTE — ED Provider Notes (Signed)
TIME SEEN: 5:08 AM  CHIEF COMPLAINT: Fall  HPI: Patient is an 86 year old female with history of hypertension, diabetes who presents to the emergency department from [facility] nursing facility with complaints of a fall.  She reports she fell out of bed try to get her walker.  She does not think she had injured her head.  She is on Eliquis.  States she is having right hip pain.  Status post right hip arthroplasty in October 2020.  Patient also admitted to the hospital in December 2020 after testing positive for COVID-19 on 06/22/2019.  Patient denies fevers, cough, shortness of breath, chest pain, abdominal pain, vomiting, diarrhea.  No numbness or focal weakness.  ROS: See HPI Constitutional: no fever  Eyes: no drainage  ENT: no runny nose   Cardiovascular:  no chest pain  Resp: no SOB  GI: no vomiting GU: no dysuria Integumentary: no rash  Allergy: no hives  Musculoskeletal: no leg swelling  Neurological: no slurred speech ROS otherwise negative  PAST MEDICAL HISTORY/PAST SURGICAL HISTORY:  Past Medical History:  Diagnosis Date  . Diabetes mellitus without complication (HCC)   . Hypertension     MEDICATIONS:  Prior to Admission medications   Medication Sig Start Date End Date Taking? Authorizing Provider  acetaminophen (TYLENOL) 500 MG tablet Take 1,000 mg by mouth 2 (two) times daily.     [provider]  acetaminophen (TYLENOL) 500 MG tablet Take 500 mg by mouth every 4 (four) hours as needed for mild pain or headache.    [provider]  acyclovir (ZOVIRAX) 800 MG tablet Take 800 mg by mouth 2 (two) times daily.    [provider]  amiodarone (PACERONE) 200 MG tablet Take 200 mg by mouth daily.    [provider]  amLODipine (NORVASC) 5 MG tablet Take 5 mg by mouth daily.    [provider]  apixaban (ELIQUIS) 5 MG TABS tablet Take 1 tablet (5 mg total) by mouth 2 (two) times daily. Please cancel previous rx's sent; call pt  with price before filling 02/24/17   Azalee Course, [place]  Cholecalciferol (D-3-5) 5000 units capsule Take 5,000 Units by mouth daily.    [provider]  conjugated estrogens (PREMARIN) vaginal cream Place 1 Applicatorful vaginally every other day.    [provider]  dexamethasone (DECADRON) 6 MG tablet Take 1 tablet (6 mg total) by mouth daily. 06/29/19   Pokhrel, Rebekah Chesterfield, MD  diltiazem (CARDIZEM CD) 120 MG 24 hr capsule Take 1 capsule (120 mg total) by mouth daily. 05/30/18   Lennette Bihari, MD  ferrous sulfate 325 (65 FE) MG tablet Take 325 mg by mouth daily with breakfast.    [provider]  guaiFENesin-dextromethorphan (ROBITUSSIN DM) 100-10 MG/5ML syrup Take 5 mLs by mouth every 4 (four) hours as needed for cough. 06/29/19   Pokhrel, Rebekah Chesterfield, MD  labetalol (NORMODYNE) 100 MG tablet Take 100-200 mg by mouth See admin instructions. Take 2 tablets every morning and take 1 tablet at bedtime    [provider]  labetalol (NORMODYNE) 200 MG tablet Take 1 tablet (200 mg total) by mouth every morning AND 0.5 tablets (100 mg total) at bedtime. Patient not taking: Reported on 06/23/2019 05/30/18   Lennette Bihari, MD  Lactobacillus Rhamnosus, GG, (CULTURELLE KIDS) CHEW Chew 1 tablet by mouth daily.    [provider]  lidocaine (LIDODERM) 5 % Place 1 patch onto the skin as needed (for pain).    [provider]  loperamide (IMODIUM A-D) 2 MG tablet Take 2 mg by mouth as needed for diarrhea or loose stools.    [provider]  loperamide (IMODIUM) 2 MG capsule Take 1 capsule (2 mg total) by mouth as needed for diarrhea or loose stools. Patient not taking: Reported on 06/23/2019 03/31/17   Tyrone Nine, MD  magnesium chloride (SLOW-MAG) 64 MG TBEC SR tablet Take 1 tablet by mouth daily.     [provider]  Melatonin 3 MG TABS Take 1 tablet (3 mg total) by mouth at bedtime. 06/29/19   Pokhrel, Rebekah Chesterfield, MD  Multiple Vitamins-Minerals (ICAPS  PLUS) TABS Take 1 tablet by mouth 2 (two) times daily.    [provider]  MYRBETRIQ 25 MG TB24 tablet Take 25 mg by mouth daily. 03/04/17   [provider]  nitroGLYCERIN (NITROSTAT) 0.4 MG SL tablet Place 0.4 mg under the tongue every 5 (five) minutes as needed for chest pain.    [provider]  ondansetron (ZOFRAN) 8 MG tablet Take 8 mg by mouth 2 (two) times daily as needed for nausea or vomiting.    [provider]  pantoprazole (PROTONIX) 40 MG tablet Take 40 mg by mouth daily.    [provider]  Polyethyl Glycol-Propyl Glycol (SYSTANE) 0.4-0.3 % SOLN Apply 1 drop to eye 2 (two) times daily.    [provider]  polyethylene glycol (MIRALAX / GLYCOLAX) packet Take 17 g by mouth daily. 05/04/18   Amin, Loura Halt, MD  prednisoLONE acetate (PRED FORTE) 1 % ophthalmic suspension Place 1 drop into the right eye daily.  03/19/17   [provider]  senna-docusate (SENOKOT-S) 8.6-50 MG tablet Take 1 tablet by mouth daily as needed for mild constipation.    [provider]  timolol (BETIMOL) 0.5 % ophthalmic solution Place 1 drop into both eyes 2 (two) times daily.    [provider]  traZODone (DESYREL) 50 MG tablet Take 50 mg by mouth at bedtime as needed for sleep.    [provider]  vitamin B-12 (CYANOCOBALAMIN) 1000 MCG tablet Take 1,000 mcg by mouth daily.    [provider]  vitamin C (ASCORBIC ACID) 500 MG tablet Take 500 mg by mouth 2 (two) times daily.    [provider]    ALLERGIES:  Allergies  Allergen Reactions  . Penicillins Rash    SOCIAL HISTORY:  Social History   Tobacco Use  . Smoking status: Former Games developer  . Smokeless tobacco: Never Used  Substance Use Topics  . Alcohol use: Yes    FAMILY HISTORY: Family History  Problem Relation Age of Onset  . Lung cancer Father   . Lymphoma Daughter     EXAM: BP 119/71 (BP Location: Right Arm)   Pulse 78   Temp  97.8 F (36.6 C) (Oral)   Resp (!) 21   Ht 5' 3.5" (1.613 m)   Wt 63.5 kg   SpO2 97%   BMI 24.41 kg/m  CONSTITUTIONAL: Alert and oriented person and place but states it is 2013 and responds appropriately to questions. Well-appearing; well-nourished; GCS 15, elderly HEAD: Normocephalic; atraumatic EYES: Conjunctivae clear, PERRL, EOMI ENT: normal nose; no rhinorrhea; moist mucous membranes; pharynx without lesions noted; no dental injury; no septal hematoma NECK: Supple, no meningismus, no LAD; no midline spinal tenderness, step-off or deformity; trachea midline CARD: RRR; S1 and S2 appreciated; no murmurs, no clicks, no rubs, no gallops RESP: Normal chest excursion without splinting or tachypnea; breath sounds clear  and equal bilaterally; no wheezes, no rhonchi, no rales; no hypoxia or respiratory distress CHEST:  chest wall stable, no crepitus or ecchymosis or deformity, nontender to palpation; no flail chest ABD/GI: Normal bowel sounds; non-distended; soft, non-tender, no rebound, no guarding; no ecchymosis or other lesions noted PELVIS:  stable, nontender to palpation BACK:  The back appears normal and is non-tender to palpation, there is no CVA tenderness; no midline spinal tenderness, step-off or deformity EXT: Normal ROM in all joints; non-tender to palpation; no edema; normal capillary refill; no cyanosis, no bony tenderness or bony deformity of patient's extremities, no joint effusion, compartments are soft, extremities are warm and well-perfused, no ecchymosis, no leg length discrepancy SKIN: Normal color for age and race; warm NEURO: Moves all extremities equally, no drift, sensation to light touch intact diffusely, cranial nerves II through XII intact, normal speech PSYCH: The patient's mood and manner are appropriate. Grooming and personal hygiene are appropriate.  MEDICAL DECISION MAKING: Patient here with reported fall out of bed.  Complains of right hip pain.  Requesting  Tylenol for pain.  Will obtain x-ray of this hip as well as CT of the head and cervical spine.  ED PROGRESS: Imaging shows no acute abnormality.  Patient able to get up out of bed on her own and ambulate with a walker which is her baseline.  Discussed with patient's daughter Anderson Malta who states that nursing home reports the patient has been weaker than normal over the past couple of days.  She is on Cipro currently for UTI.  Have offered to check blood work but patient's daughter declines stating this can be done as an outpatient with her doctor.  She feels comfortable with patient being discharged back to the nursing facility.  I feel this is a reasonable plan given she has no signs of focal or generalized weakness currently on our exam.  No infectious symptoms and is afebrile with normal vitals.  No current complaints of pain and is well-appearing, well-hydrated.  Discussed return precautions with patient's daughter and patient.  I feel she is safe to be discharged back to [facility].    Tecia Cinnamon was evaluated in Emergency Department on 08/05/2019 for the symptoms described in the history of present illness. She was evaluated in the context of the global COVID-19 pandemic, which necessitated consideration that the patient might be at risk for infection with the SARS-CoV-2 virus that causes COVID-19. Institutional protocols and algorithms that pertain to the evaluation of patients at risk for COVID-19 are in a state of rapid change based on information released by regulatory bodies including the CDC and federal and state organizations. These policies and algorithms were followed during the patient's care in the ED.  Patient was seen wearing N95, face shield, gloves.     Shoichi Mielke, Delice Bison, DO 08/05/19 [phone]

## 2019-08-05 NOTE — ED Triage Notes (Signed)
Pt brought in by  EMS from [facility], states that she fell oob trying to get her walker, Alert x3 , COVID positive back in December.

## 2019-08-05 NOTE — ED Notes (Signed)
Pt ambulated with walker with minimal assistance. Ward, MD made aware.

## 2019-08-13 ENCOUNTER — Inpatient Hospital Stay (HOSPITAL_COMMUNITY)
Admission: EM | Admit: 2019-08-13 | Discharge: 2019-08-21 | DRG: 071 | Disposition: A | Discharge status: Skilled Nursing Facility | Payer: Medicare Other | Attending: Student | Admitting: Student

## 2019-08-13 ENCOUNTER — Emergency Department (HOSPITAL_COMMUNITY): Payer: Medicare Other

## 2019-08-13 ENCOUNTER — Other Ambulatory Visit: Payer: Self-pay

## 2019-08-13 ENCOUNTER — Encounter (HOSPITAL_COMMUNITY): Payer: Self-pay

## 2019-08-13 DIAGNOSIS — I5032 Chronic diastolic (congestive) heart failure: Secondary | ICD-10-CM | POA: Diagnosis not present

## 2019-08-13 DIAGNOSIS — F0390 Unspecified dementia without behavioral disturbance: Secondary | ICD-10-CM | POA: Diagnosis present

## 2019-08-13 DIAGNOSIS — T462X5A Adverse effect of other antidysrhythmic drugs, initial encounter: Secondary | ICD-10-CM | POA: Diagnosis not present

## 2019-08-13 DIAGNOSIS — E119 Type 2 diabetes mellitus without complications: Secondary | ICD-10-CM

## 2019-08-13 DIAGNOSIS — Z20822 Contact with and (suspected) exposure to covid-19: Secondary | ICD-10-CM | POA: Diagnosis present

## 2019-08-13 DIAGNOSIS — I110 Hypertensive heart disease with heart failure: Secondary | ICD-10-CM | POA: Diagnosis present

## 2019-08-13 DIAGNOSIS — R531 Weakness: Secondary | ICD-10-CM

## 2019-08-13 DIAGNOSIS — G9341 Metabolic encephalopathy: Secondary | ICD-10-CM | POA: Diagnosis not present

## 2019-08-13 DIAGNOSIS — R9431 Abnormal electrocardiogram [ECG] [EKG]: Secondary | ICD-10-CM | POA: Diagnosis present

## 2019-08-13 DIAGNOSIS — F05 Delirium due to known physiological condition: Secondary | ICD-10-CM

## 2019-08-13 DIAGNOSIS — F329 Major depressive disorder, single episode, unspecified: Secondary | ICD-10-CM | POA: Diagnosis present

## 2019-08-13 DIAGNOSIS — R627 Adult failure to thrive: Secondary | ICD-10-CM | POA: Diagnosis present

## 2019-08-13 DIAGNOSIS — Z7901 Long term (current) use of anticoagulants: Secondary | ICD-10-CM

## 2019-08-13 DIAGNOSIS — R339 Retention of urine, unspecified: Secondary | ICD-10-CM | POA: Diagnosis present

## 2019-08-13 DIAGNOSIS — I951 Orthostatic hypotension: Secondary | ICD-10-CM | POA: Diagnosis not present

## 2019-08-13 DIAGNOSIS — I480 Paroxysmal atrial fibrillation: Secondary | ICD-10-CM

## 2019-08-13 DIAGNOSIS — I10 Essential (primary) hypertension: Secondary | ICD-10-CM

## 2019-08-13 DIAGNOSIS — E876 Hypokalemia: Secondary | ICD-10-CM | POA: Diagnosis not present

## 2019-08-13 DIAGNOSIS — G9340 Encephalopathy, unspecified: Secondary | ICD-10-CM | POA: Diagnosis present

## 2019-08-13 DIAGNOSIS — Z6825 Body mass index (BMI) 25.0-25.9, adult: Secondary | ICD-10-CM

## 2019-08-13 DIAGNOSIS — Z87891 Personal history of nicotine dependence: Secondary | ICD-10-CM

## 2019-08-13 DIAGNOSIS — Z8619 Personal history of other infectious and parasitic diseases: Secondary | ICD-10-CM

## 2019-08-13 DIAGNOSIS — L8990 Pressure ulcer of unspecified site, unspecified stage: Secondary | ICD-10-CM | POA: Diagnosis present

## 2019-08-13 DIAGNOSIS — Z79899 Other long term (current) drug therapy: Secondary | ICD-10-CM

## 2019-08-13 DIAGNOSIS — L8991 Pressure ulcer of unspecified site, stage 1: Secondary | ICD-10-CM | POA: Diagnosis present

## 2019-08-13 DIAGNOSIS — Z880 Allergy status to penicillin: Secondary | ICD-10-CM

## 2019-08-13 LAB — BASIC METABOLIC PANEL
Anion gap: 11 (ref 5–15)
BUN: 25 mg/dL — ABNORMAL HIGH (ref 8–23)
CO2: 21 mmol/L — ABNORMAL LOW (ref 22–32)
Calcium: 8.4 mg/dL — ABNORMAL LOW (ref 8.9–10.3)
Chloride: 107 mmol/L (ref 98–111)
Creatinine, Ser: 0.72 mg/dL (ref 0.44–1.00)
GFR calc Af Amer: >60 mL/min (ref >60)
GFR calc non Af Amer: >60 mL/min (ref >60)
Glucose, Bld: 253 mg/dL — ABNORMAL HIGH (ref 70–99)
Potassium: 4.3 mmol/L (ref 3.5–5.1)
Sodium: 139 mmol/L (ref 135–145)

## 2019-08-13 LAB — CBC
HCT: 39.2 % (ref 36.0–46.0)
Hemoglobin: 13 g/dL (ref 12.0–15.0)
MCH: 33.3 pg (ref 26.0–34.0)
MCHC: 33.2 g/dL (ref 30.0–36.0)
MCV: 100.5 fL — ABNORMAL HIGH (ref 80.0–100.0)
Platelets: 217 10*3/uL (ref 150–400)
RBC: 3.9 MIL/uL (ref 3.87–5.11)
RDW: 15.9 % — ABNORMAL HIGH (ref 11.5–15.5)
WBC: 11.2 10*3/uL — ABNORMAL HIGH (ref 4.0–10.5)
nRBC: 0 % (ref 0.0–0.2)

## 2019-08-13 LAB — CBG MONITORING, ED: Glucose-Capillary: 229 mg/dL — ABNORMAL HIGH (ref 70–99)

## 2019-08-13 LAB — URINALYSIS, COMPLETE (UACMP) WITH MICROSCOPIC
Bacteria, UA: NONE SEEN
Bilirubin Urine: NEGATIVE
Glucose, UA: >=500 mg/dL — AB
Hgb urine dipstick: NEGATIVE
Ketones, ur: 5 mg/dL — AB
Leukocytes,Ua: NEGATIVE
Nitrite: NEGATIVE
Protein, ur: NEGATIVE mg/dL
Specific Gravity, Urine: 1.02 (ref 1.005–1.030)
pH: 5 (ref 5.0–8.0)

## 2019-08-13 LAB — TROPONIN I (HIGH SENSITIVITY)
Troponin I (High Sensitivity): 36 ng/L — ABNORMAL HIGH (ref <18)
Troponin I (High Sensitivity): 38 ng/L — ABNORMAL HIGH (ref <18)

## 2019-08-13 IMAGING — CT CT HEAD W/O CM
4 series · 17 of 47 positions shown, 19 images · non-contrast
Comparison: August 05, 2019

CLINICAL DATA: Altered mental status.

EXAM:
CT HEAD WITHOUT CONTRAST
TECHNIQUE: Contiguous axial images were obtained from the base of the skull
through the vertex without intravenous contrast.

[Series 3: head without · axial · non-contrast · 0.44mm/px · z∈[+1417,+1552]mm · 7 of 37 slices shown, 9 images]
[im 5/37  brain]
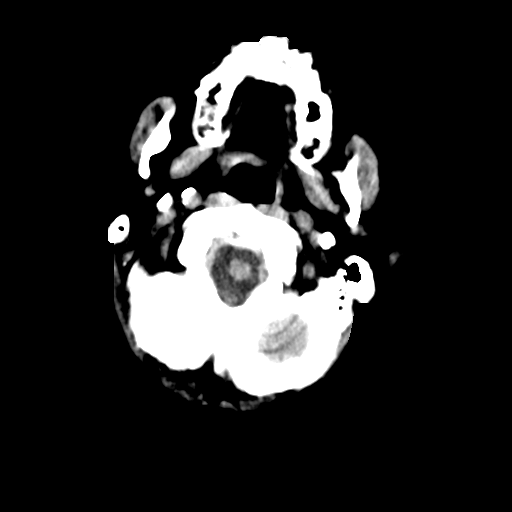
[im 5/37  bone]
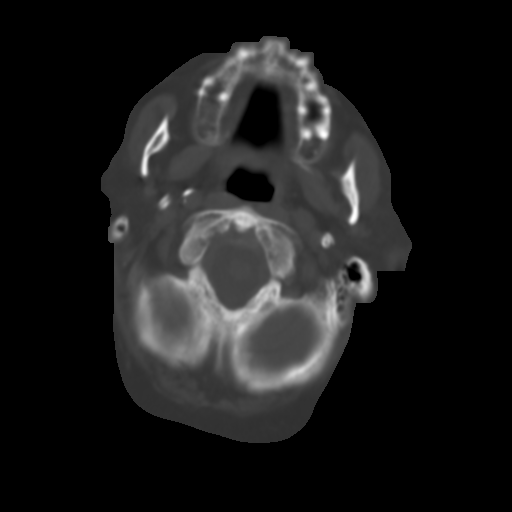
[im 10/37  brain]
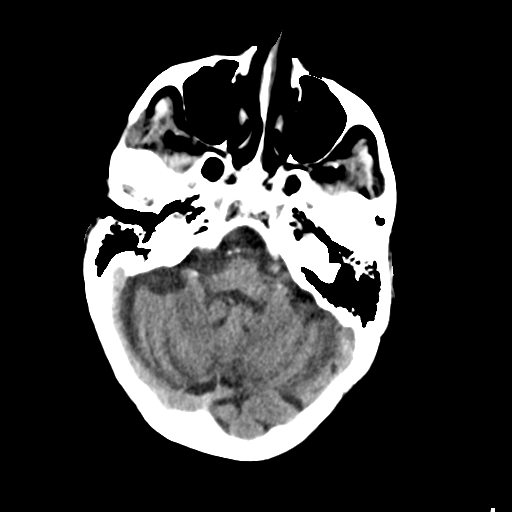
[im 14/37  brain]
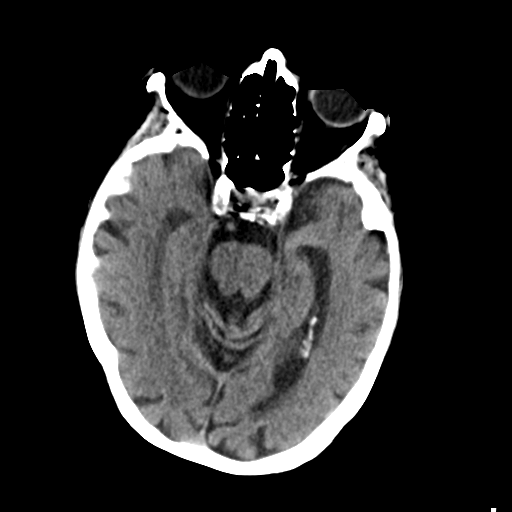
[im 19/37  brain]
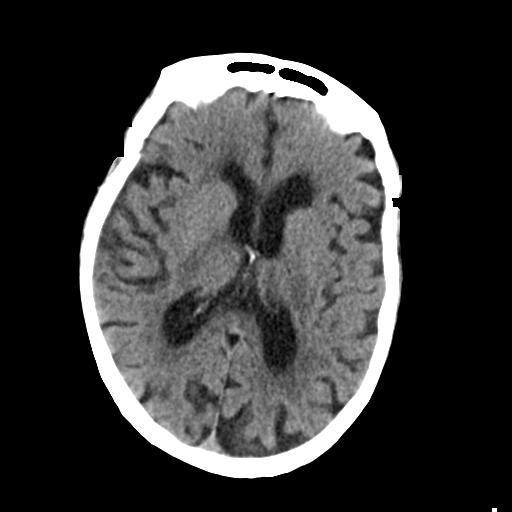
[im 23/37  brain]
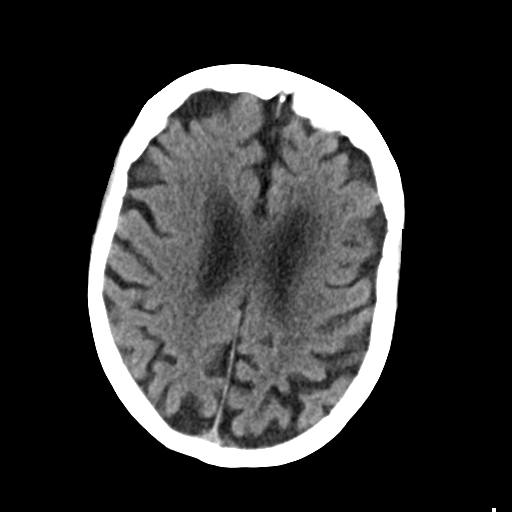
[im 23/37  bone]
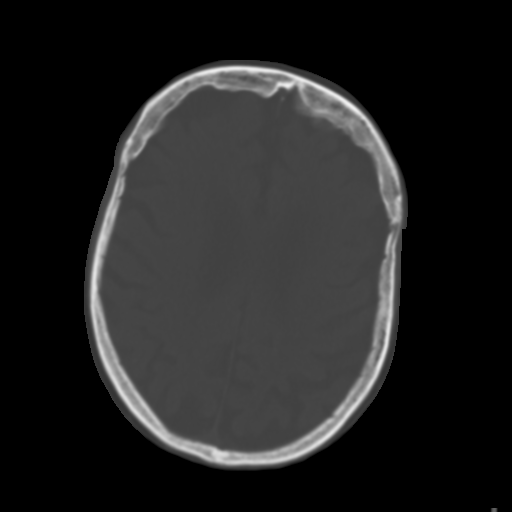
[im 28/37  brain]
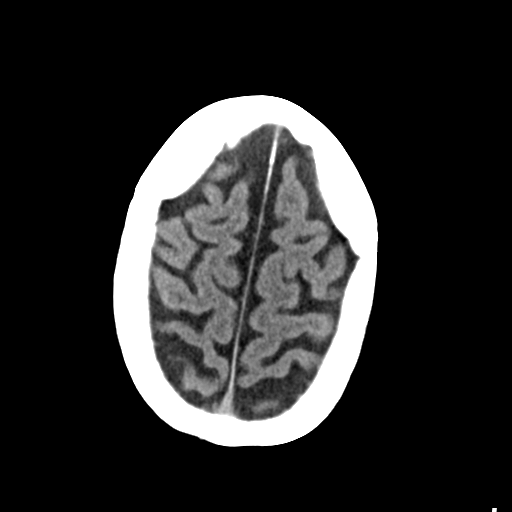
[im 32/37  brain]
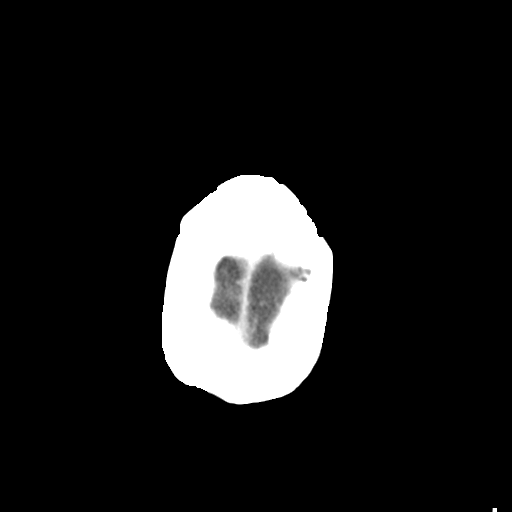

[Series 4: head bone · axial · 0.44mm/px · z∈[+1415,+1477]mm · 4 of 91 slices shown]
[im 10/91  bone]
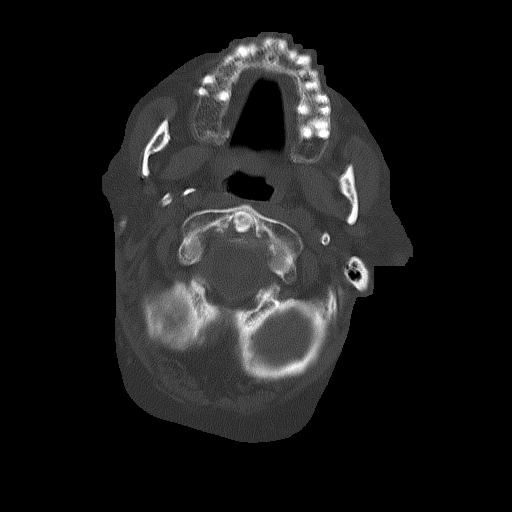
[im 19/91  bone]
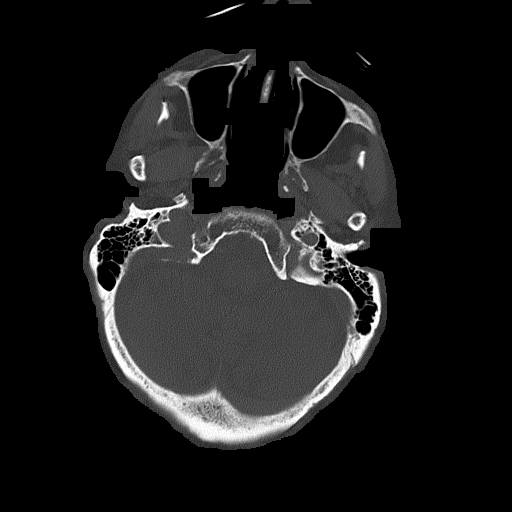
[im 28/91  bone]
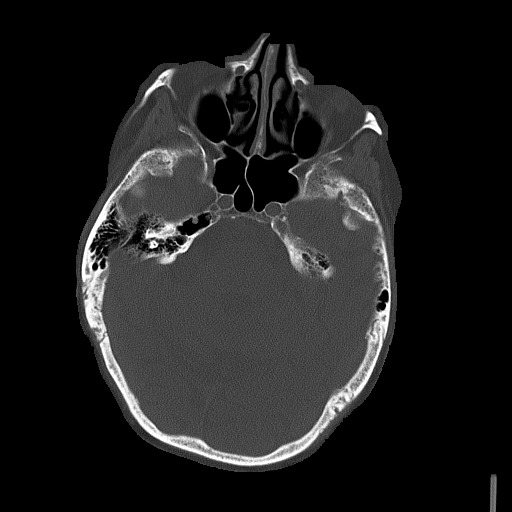
[im 41/91  bone]
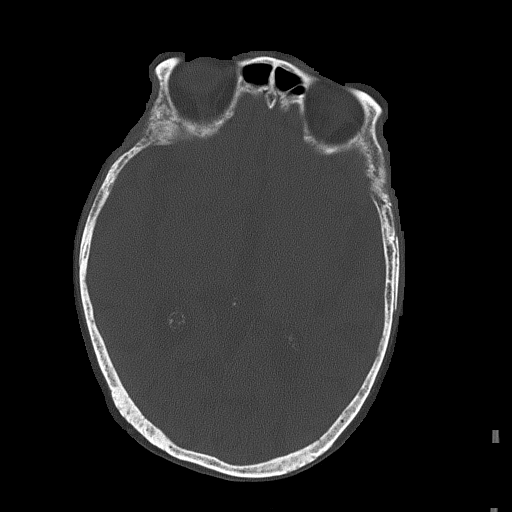

[Series 5: head without cor · coronal · non-contrast · 0.34mm/px · 3 of 72 slices shown]
[im 24/72  brain]
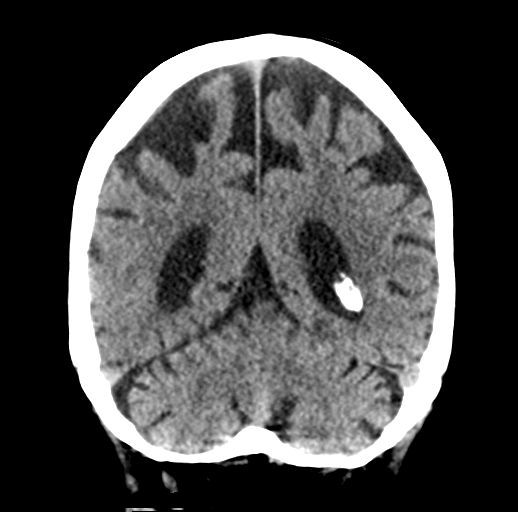
[im 32/72  brain]
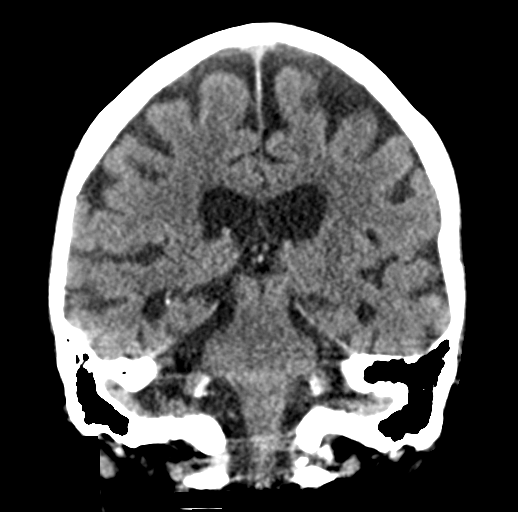
[im 40/72  brain]
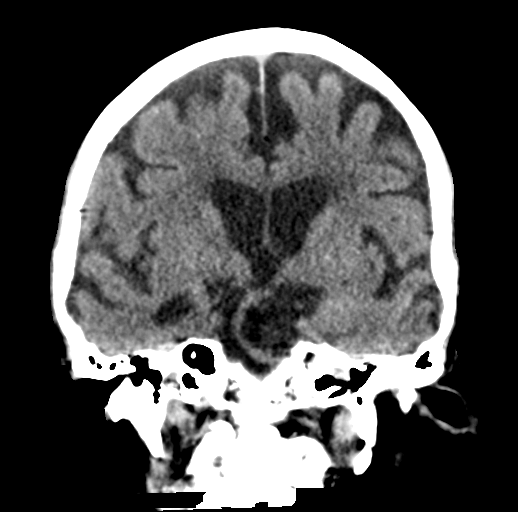

[Series 6: head without sag · sagittal · non-contrast · 0.35mm/px · 3 of 57 slices shown]
[im 19/57  brain]
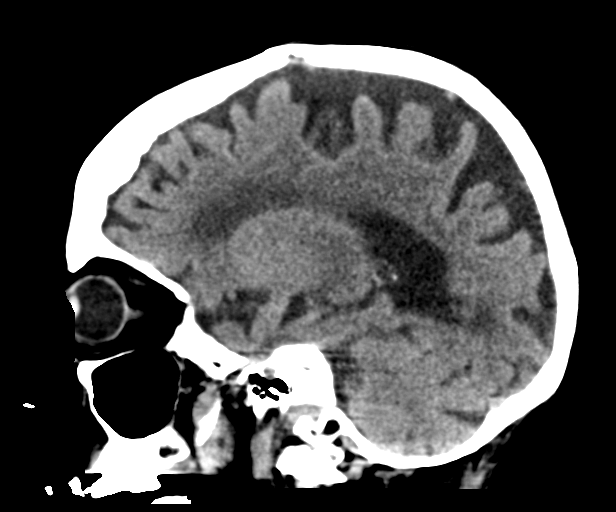
[im 29/57  brain]
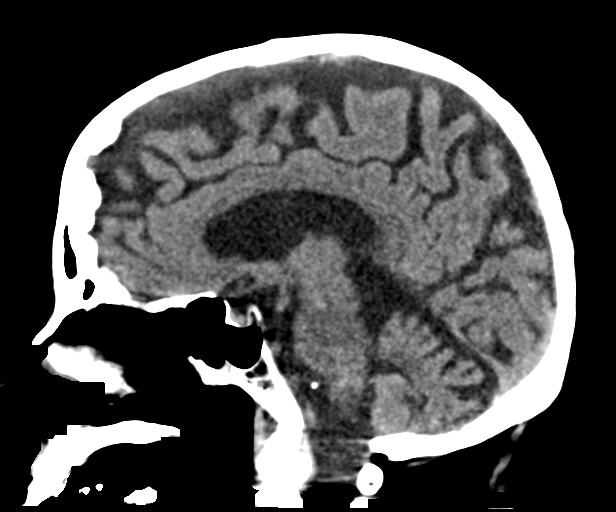
[im 38/57  brain]
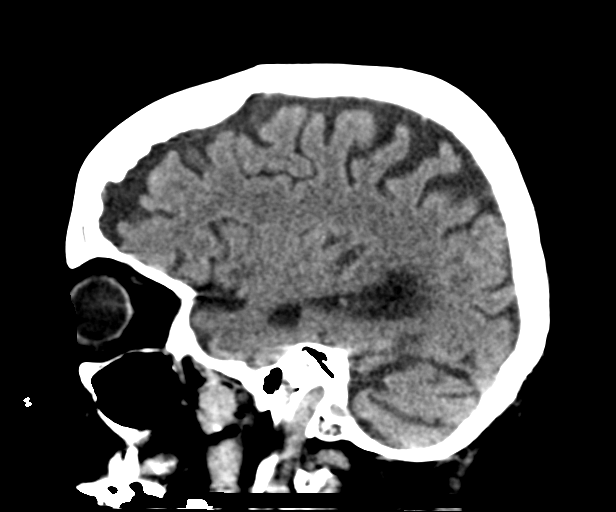

[17 of 47 positions shown; findings below may reference images not displayed]

FINDINGS: Brain: There is mild to moderate severity cerebral atrophy with
widening of the extra-axial spaces and ventricular dilatation.
There are areas of decreased attenuation within the white matter
tracts of the supratentorial brain, consistent with microvascular
disease changes.

Vascular: No hyperdense vessel or unexpected calcification.

Skull: Normal. Negative for fracture or focal lesion.

Sinuses/Orbits: No acute finding.

Other: None.
IMPRESSION: 1. Generalized cerebral atrophy.
2. No acute intracranial abnormality.

## 2019-08-13 IMAGING — DX DG CHEST 1V PORT
1 series · 1 of 1 positions shown · non-contrast
Comparison: June 22, 2019

CLINICAL DATA: Generalized weakness for 2 days.

EXAM:
PORTABLE CHEST 1 VIEW

[chest ap]
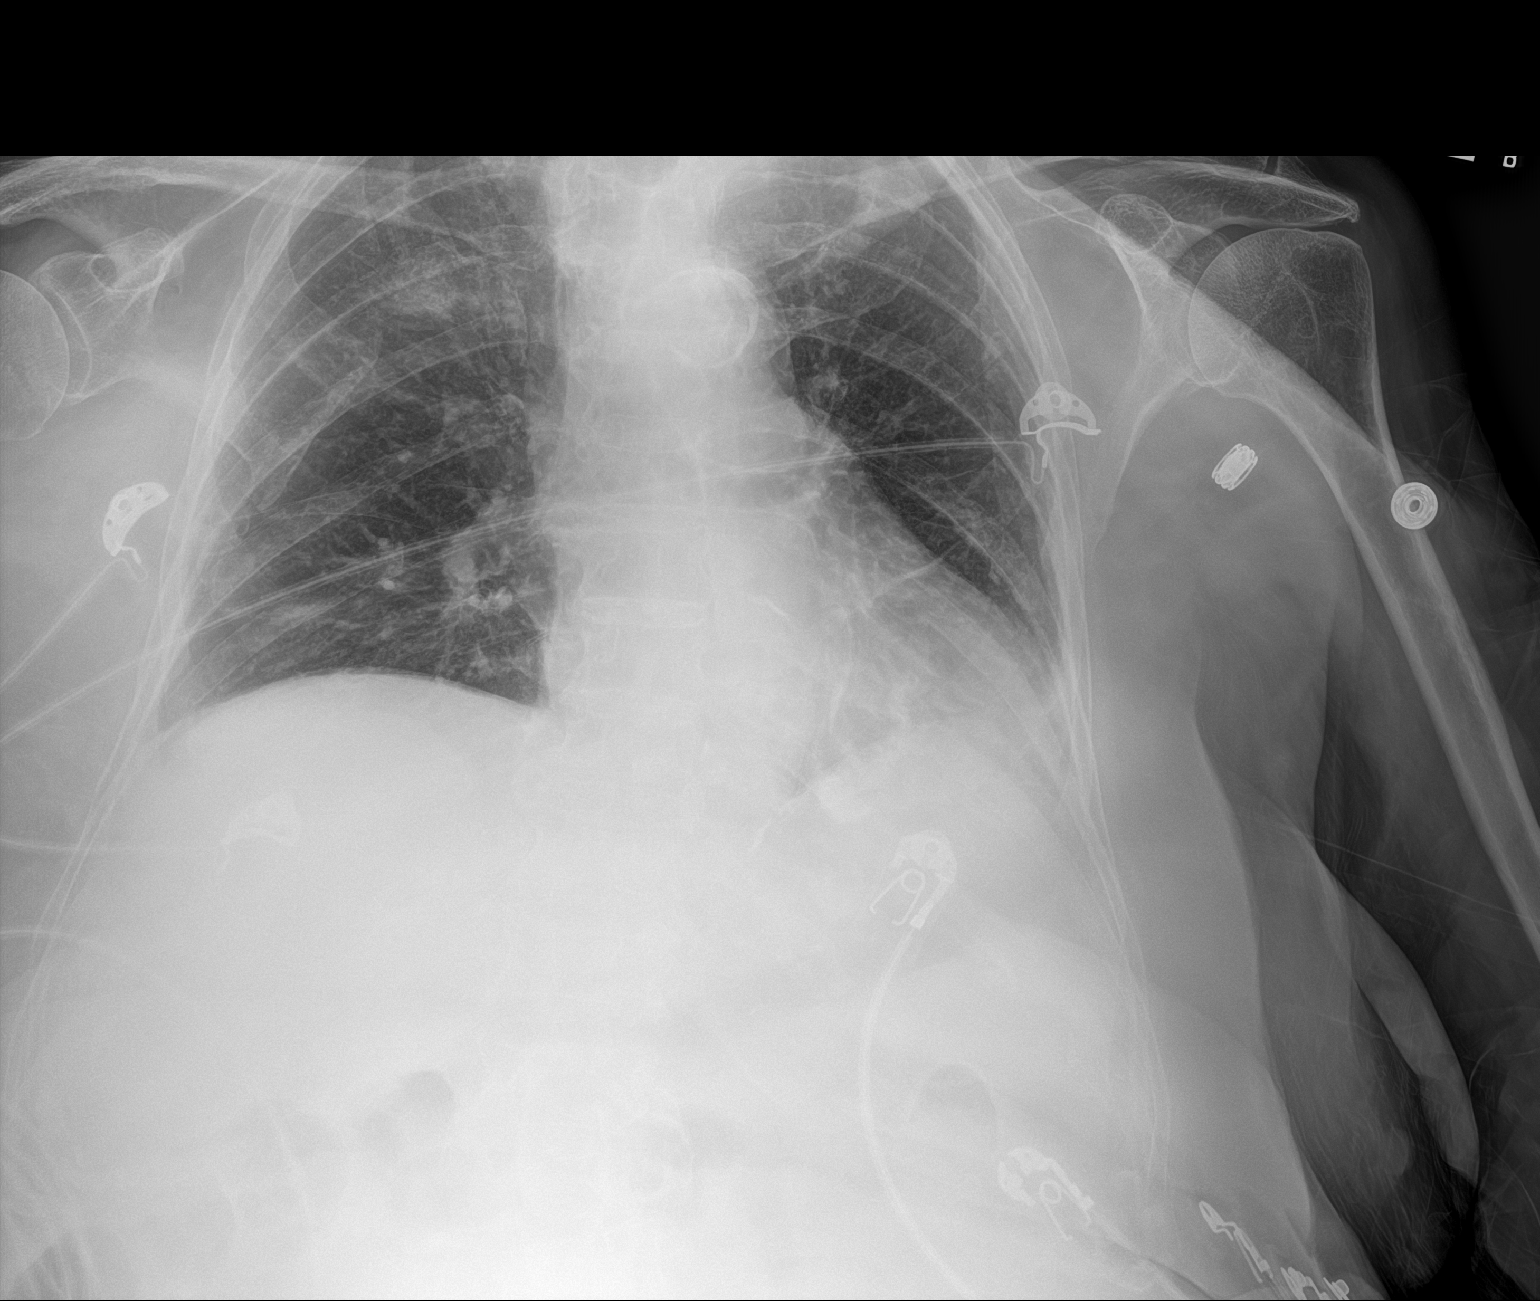

[1 of 1 positions shown; findings below may reference images not displayed]

FINDINGS: Mild, chronic appearing increased interstitial lung markings are
seen which are stable in severity. Mild, stable linear scarring
and/or atelectasis is seen within the bilateral lung bases. There is
no evidence of a pleural effusion or pneumothorax. The heart size
and mediastinal contours are within normal limits. There is marked
severity calcification of the thoracic aorta. Multilevel
degenerative changes seen throughout the thoracic spine.
IMPRESSION: 1. Chronic appearing increased interstitial lung markings with mild,
stable bibasilar linear scarring and/or atelectasis.

## 2019-08-13 MED ORDER — LIDOCAINE 5 % EX PTCH
1.0000 | MEDICATED_PATCH | CUTANEOUS | Status: DC | PRN
  Administered 2019-08-18: 14:00:00 1 via TRANSDERMAL
  Filled 2019-08-13: qty 1

## 2019-08-13 MED ORDER — FERROUS SULFATE 325 (65 FE) MG PO TABS
325.0000 mg | ORAL_TABLET | Freq: Every day | ORAL | Status: DC
  Administered 2019-08-14 – 2019-08-21 (×8): 325 mg via ORAL
  Filled 2019-08-13 (×8): qty 1

## 2019-08-13 MED ORDER — DILTIAZEM HCL ER COATED BEADS 120 MG PO CP24
120.0000 mg | ORAL_CAPSULE | Freq: Every day | ORAL | Status: DC
  Administered 2019-08-14 – 2019-08-15 (×2): 120 mg via ORAL
  Filled 2019-08-13 (×2): qty 1

## 2019-08-13 MED ORDER — ACYCLOVIR 400 MG PO TABS
800.0000 mg | ORAL_TABLET | Freq: Two times a day (BID) | ORAL | Status: DC
  Administered 2019-08-14 – 2019-08-16 (×6): 800 mg via ORAL
  Filled 2019-08-13 (×6): qty 2
  Filled 2019-08-13: qty 1
  Filled 2019-08-13: qty 2

## 2019-08-13 MED ORDER — TIMOLOL MALEATE 0.5 % OP SOLN
1.0000 [drp] | Freq: Two times a day (BID) | OPHTHALMIC | Status: DC
  Administered 2019-08-14 – 2019-08-21 (×15): 1 [drp] via OPHTHALMIC
  Filled 2019-08-13: qty 5

## 2019-08-13 MED ORDER — PREDNISOLONE ACETATE 1 % OP SUSP
1.0000 [drp] | Freq: Every day | OPHTHALMIC | Status: DC
  Administered 2019-08-14 – 2019-08-21 (×8): 1 [drp] via OPHTHALMIC
  Filled 2019-08-13: qty 5

## 2019-08-13 MED ORDER — INSULIN ASPART 100 UNIT/ML ~~LOC~~ SOLN
0.0000 [IU] | Freq: Three times a day (TID) | SUBCUTANEOUS | Status: DC
  Administered 2019-08-14: 3 [IU] via SUBCUTANEOUS
  Administered 2019-08-14: 1 [IU] via SUBCUTANEOUS
  Administered 2019-08-15: 07:00:00 2 [IU] via SUBCUTANEOUS
  Administered 2019-08-15: 18:00:00 5 [IU] via SUBCUTANEOUS
  Administered 2019-08-15 – 2019-08-16 (×2): 1 [IU] via SUBCUTANEOUS
  Administered 2019-08-16: 16:00:00 2 [IU] via SUBCUTANEOUS
  Administered 2019-08-16: 13:00:00 1 [IU] via SUBCUTANEOUS
  Administered 2019-08-17: 3 [IU] via SUBCUTANEOUS
  Administered 2019-08-17: 1 [IU] via SUBCUTANEOUS
  Administered 2019-08-18: 17:00:00 3 [IU] via SUBCUTANEOUS
  Administered 2019-08-18 – 2019-08-19 (×2): 2 [IU] via SUBCUTANEOUS
  Administered 2019-08-19 – 2019-08-20 (×2): 1 [IU] via SUBCUTANEOUS
  Administered 2019-08-20: 18:00:00 2 [IU] via SUBCUTANEOUS
  Administered 2019-08-21: 1 [IU] via SUBCUTANEOUS

## 2019-08-13 MED ORDER — MAGNESIUM CHLORIDE 64 MG PO TBEC
64.0000 mg | DELAYED_RELEASE_TABLET | Freq: Every day | ORAL | Status: DC
  Administered 2019-08-14 – 2019-08-21 (×8): 64 mg via ORAL
  Filled 2019-08-13 (×9): qty 1

## 2019-08-13 MED ORDER — POLYETHYLENE GLYCOL 3350 17 G PO PACK
17.0000 g | PACK | Freq: Every day | ORAL | Status: DC
  Administered 2019-08-14 – 2019-08-21 (×6): 17 g via ORAL
  Filled 2019-08-13 (×6): qty 1

## 2019-08-13 MED ORDER — INSULIN ASPART 100 UNIT/ML ~~LOC~~ SOLN
0.0000 [IU] | Freq: Every day | SUBCUTANEOUS | Status: DC
  Administered 2019-08-14: 2 [IU] via SUBCUTANEOUS
  Administered 2019-08-17: 22:00:00 3 [IU] via SUBCUTANEOUS

## 2019-08-13 MED ORDER — TRAZODONE HCL 50 MG PO TABS
50.0000 mg | ORAL_TABLET | Freq: Every evening | ORAL | Status: DC | PRN
  Administered 2019-08-18: 22:00:00 50 mg via ORAL
  Filled 2019-08-13: qty 1

## 2019-08-13 MED ORDER — ACETAMINOPHEN 325 MG PO TABS
650.0000 mg | ORAL_TABLET | Freq: Four times a day (QID) | ORAL | Status: DC | PRN
  Administered 2019-08-15 – 2019-08-16 (×2): 650 mg via ORAL
  Filled 2019-08-13 (×2): qty 2

## 2019-08-13 MED ORDER — POLYVINYL ALCOHOL 1.4 % OP SOLN
1.0000 [drp] | Freq: Two times a day (BID) | OPHTHALMIC | Status: DC
  Administered 2019-08-14 – 2019-08-21 (×15): 1 [drp] via OPHTHALMIC
  Filled 2019-08-13: qty 15

## 2019-08-13 MED ORDER — SODIUM CHLORIDE 0.9 % IV SOLN: INTRAVENOUS | Status: DC

## 2019-08-13 MED ORDER — ASCORBIC ACID 500 MG PO TABS
500.0000 mg | ORAL_TABLET | Freq: Two times a day (BID) | ORAL | Status: DC
  Administered 2019-08-14 – 2019-08-21 (×15): 500 mg via ORAL
  Filled 2019-08-13 (×15): qty 1

## 2019-08-13 MED ORDER — NITROFURANTOIN MONOHYD MACRO 100 MG PO CAPS
100.0000 mg | ORAL_CAPSULE | Freq: Two times a day (BID) | ORAL | Status: DC
  Filled 2019-08-13 (×2): qty 1

## 2019-08-13 MED ORDER — DEXAMETHASONE 2 MG PO TABS
2.0000 mg | ORAL_TABLET | Freq: Every day | ORAL | Status: DC
  Administered 2019-08-21: 2 mg via ORAL
  Filled 2019-08-13: qty 1

## 2019-08-13 MED ORDER — SODIUM CHLORIDE 0.9 % IV BOLUS
1000.0000 mL | Freq: Once | INTRAVENOUS | Status: AC
  Administered 2019-08-13: 17:00:00 1000 mL via INTRAVENOUS

## 2019-08-13 MED ORDER — ONDANSETRON HCL 4 MG/2ML IJ SOLN: 4.0000 mg | Freq: Four times a day (QID) | INTRAMUSCULAR | Status: DC | PRN

## 2019-08-13 MED ORDER — DEXAMETHASONE 4 MG PO TABS: 4.0000 mg | ORAL_TABLET | Freq: Every day | ORAL | Status: DC

## 2019-08-13 MED ORDER — LABETALOL HCL 200 MG PO TABS: 100.0000 mg | ORAL_TABLET | ORAL | Status: DC

## 2019-08-13 MED ORDER — ONDANSETRON HCL 4 MG PO TABS: 4.0000 mg | ORAL_TABLET | Freq: Four times a day (QID) | ORAL | Status: DC | PRN

## 2019-08-13 MED ORDER — AMLODIPINE BESYLATE 5 MG PO TABS
5.0000 mg | ORAL_TABLET | Freq: Every day | ORAL | Status: DC
  Administered 2019-08-14: 5 mg via ORAL
  Filled 2019-08-13: qty 1

## 2019-08-13 MED ORDER — MIRABEGRON ER 25 MG PO TB24
25.0000 mg | ORAL_TABLET | Freq: Every day | ORAL | Status: DC
  Administered 2019-08-14: 11:00:00 25 mg via ORAL
  Filled 2019-08-13: qty 1

## 2019-08-13 MED ORDER — SODIUM CHLORIDE 0.9 % IV SOLN: Freq: Once | INTRAVENOUS | Status: AC

## 2019-08-13 MED ORDER — VITAMIN D 25 MCG (1000 UNIT) PO TABS
5000.0000 [IU] | ORAL_TABLET | Freq: Every day | ORAL | Status: DC
  Administered 2019-08-14 – 2019-08-21 (×8): 5000 [IU] via ORAL
  Filled 2019-08-13 (×8): qty 5

## 2019-08-13 MED ORDER — SENNOSIDES-DOCUSATE SODIUM 8.6-50 MG PO TABS: 1.0000 | ORAL_TABLET | Freq: Every day | ORAL | Status: DC | PRN

## 2019-08-13 MED ORDER — ACETAMINOPHEN 650 MG RE SUPP: 650.0000 mg | Freq: Four times a day (QID) | RECTAL | Status: DC | PRN

## 2019-08-13 MED ORDER — VITAMIN B-12 1000 MCG PO TABS
1000.0000 ug | ORAL_TABLET | Freq: Every day | ORAL | Status: DC
  Administered 2019-08-14 – 2019-08-15 (×2): 1000 ug via ORAL
  Filled 2019-08-13 (×2): qty 1

## 2019-08-13 MED ORDER — AMIODARONE HCL 200 MG PO TABS
200.0000 mg | ORAL_TABLET | Freq: Every day | ORAL | Status: DC
  Administered 2019-08-14 – 2019-08-21 (×8): 200 mg via ORAL
  Filled 2019-08-13 (×8): qty 1

## 2019-08-13 MED ORDER — DEXAMETHASONE 4 MG PO TABS
4.0000 mg | ORAL_TABLET | Freq: Every day | ORAL | Status: AC
  Administered 2019-08-14 – 2019-08-20 (×7): 4 mg via ORAL
  Filled 2019-08-13 (×7): qty 1

## 2019-08-13 MED ORDER — ESTROGENS, CONJUGATED 0.625 MG/GM VA CREA
1.0000 | TOPICAL_CREAM | VAGINAL | Status: DC
  Administered 2019-08-14 – 2019-08-20 (×4): 1 via VAGINAL
  Filled 2019-08-13: qty 30

## 2019-08-13 MED ORDER — MELATONIN 3 MG PO TABS
3.0000 mg | ORAL_TABLET | Freq: Every day | ORAL | Status: DC
  Administered 2019-08-14 – 2019-08-20 (×7): 3 mg via ORAL
  Filled 2019-08-13 (×9): qty 1

## 2019-08-13 MED ORDER — PANTOPRAZOLE SODIUM 40 MG PO TBEC
40.0000 mg | DELAYED_RELEASE_TABLET | Freq: Every day | ORAL | Status: DC
  Administered 2019-08-14 – 2019-08-21 (×8): 40 mg via ORAL
  Filled 2019-08-13 (×8): qty 1

## 2019-08-13 MED ORDER — ACETAMINOPHEN 500 MG PO TABS
1000.0000 mg | ORAL_TABLET | Freq: Two times a day (BID) | ORAL | Status: DC
  Administered 2019-08-14 – 2019-08-21 (×16): 1000 mg via ORAL
  Filled 2019-08-13 (×16): qty 2

## 2019-08-13 MED ORDER — DEXAMETHASONE 2 MG PO TABS: 1.0000 mg | ORAL_TABLET | Freq: Every day | ORAL | Status: DC

## 2019-08-13 MED ORDER — ADULT MULTIVITAMIN W/MINERALS CH
1.0000 | ORAL_TABLET | Freq: Two times a day (BID) | ORAL | Status: DC
  Administered 2019-08-14 – 2019-08-21 (×15): 1 via ORAL
  Filled 2019-08-13 (×15): qty 1

## 2019-08-13 MED ORDER — APIXABAN 5 MG PO TABS
5.0000 mg | ORAL_TABLET | Freq: Two times a day (BID) | ORAL | Status: DC
  Administered 2019-08-14 – 2019-08-21 (×15): 5 mg via ORAL
  Filled 2019-08-13 (×16): qty 1

## 2019-08-13 NOTE — ED Notes (Signed)
Pt transported to CT ?

## 2019-08-13 NOTE — ED Notes (Signed)
I walked into pt's room and she had her gown off, had ripped her IV out and ripped everything off of her. I cleaned pt up, applied a clean brief and gown.

## 2019-08-13 NOTE — ED Notes (Signed)
CBG Results of 229 reported to [place], Charity fundraiser.

## 2019-08-13 NOTE — ED Notes (Signed)
I placed mitts on pt's hands and they are not tied down.

## 2019-08-13 NOTE — H&P (Signed)
History and Physical    Tracey Porter NWG:[id number] DOB: 07-11-1930 DOA: 08/13/2019  PCP: Florentina Jenny, MD  Patient coming from: SNF  I have personally briefly reviewed patient's old medical records in [facility]  Chief Complaint: Generalized weakness  HPI: [name] is a 86 y.o. female with medical history significant of Dementia, DM2, HTN, PAF on eliquis.  Patient had COVID-19 in Dec.  Looks like she has been on Decadron continuously since discharge.  Pt presents to the ED today from [facility] [place].  Staff reports that pt has had weakness for 2 days and has not eaten or drank anything in 2-3 days.  Pt was orthostatic, so EMS gave her 1L NS en route.  Pt denies any specific pain, she just feels weak.   ED Course: Labs unimpressive other than BGL in the 200s.  EKG with Q waves in lead III which is new since 2019.  Trops of 36 and 38.  Patient got 2nd L NS in ED, continues to be orthostatic.  Now developing sundowning and increased confusion in ED.  CT head neg.   Review of Systems: Unable to perform due to AMS.  Past Medical History:  Diagnosis Date  . Diabetes mellitus without complication (HCC)   . Hypertension     Past Surgical History:  Procedure Laterality Date  . ABDOMINAL HYSTERECTOMY  1976  . EYE SURGERY    . HIP ARTHROPLASTY Right 05/03/2018   Procedure: ARTHROPLASTY BIPOLAR HIP (HEMIARTHROPLASTY);  Surgeon: Roby Lofts, MD;  Location: [facility] OR;  Service: Orthopedics;  Laterality: Right;     reports that she has quit smoking. She has never used smokeless tobacco. She reports current alcohol use. She reports that she does not use drugs.  Allergies  Allergen Reactions  . Penicillins Rash    Did it involve swelling of the face/tongue/throat, SOB, or low BP? Unknown Did it involve sudden or severe rash/hives, skin peeling, or any reaction on the inside of your mouth or nose? Unknown Did you need to seek medical attention at a hospital  or doctor's office? Unknown When did it last happen? If all above answers are "NO", may proceed with cephalosporin use.     Family History  Problem Relation Age of Onset  . Lung cancer Father   . Lymphoma Daughter      Prior to Admission medications   Medication Sig Start Date End Date Taking? Authorizing Provider  acetaminophen (TYLENOL) 500 MG tablet Take 1,000 mg by mouth See admin instructions. 1000 mg twice a day and 500 mg every 4 hours as needed for pain/headache    [provider]  acyclovir (ZOVIRAX) 800 MG tablet Take 800 mg by mouth 2 (two) times daily.    [provider]  amiodarone (PACERONE) 200 MG tablet Take 200 mg by mouth daily.    [provider]  amLODipine (NORVASC) 5 MG tablet Take 5 mg by mouth daily.    [provider]  apixaban (ELIQUIS) 5 MG TABS tablet Take 1 tablet (5 mg total) by mouth 2 (two) times daily. Please cancel previous rx's sent; call pt with price before filling 02/24/17   Azalee Course, PA  Cholecalciferol (VITAMIN D) 125 MCG (5000 UT) CAPS Take 5,000 Units by mouth daily.    [provider]  conjugated estrogens (PREMARIN) vaginal cream Place 1 Applicatorful vaginally every other day.    [provider]  diltiazem (CARDIZEM CD) 120 MG 24 hr capsule Take 1 capsule (120 mg total)  by mouth daily. 05/30/18   Lennette Bihari, MD  ferrous sulfate 325 (65 FE) MG tablet Take 325 mg by mouth daily with breakfast.    [provider]  labetalol (NORMODYNE) 100 MG tablet Take 100-200 mg by mouth See admin instructions. Take 2 tablets every morning and take 1 tablet at bedtime    [provider]  Lactobacillus Rhamnosus, GG, (CULTURELLE KIDS) CHEW Chew 1 tablet by mouth daily.    [provider]  lidocaine (LIDODERM) 5 % Place 1 patch onto the skin as needed (for pain).    [provider]  loperamide (IMODIUM) 2 MG capsule Take 1 capsule (2 mg total) by mouth as needed  for diarrhea or loose stools. 03/31/17   Tyrone Nine, MD  magnesium chloride (SLOW-MAG) 64 MG TBEC SR tablet Take 64 mg by mouth daily.     [provider]  Melatonin 3 MG TABS Take 1 tablet (3 mg total) by mouth at bedtime. 06/29/19   Pokhrel, Rebekah Chesterfield, MD  Multiple Vitamins-Minerals (ICAPS PLUS) TABS Take 1 tablet by mouth 2 (two) times daily.    [provider]  MYRBETRIQ 25 MG TB24 tablet Take 25 mg by mouth daily. 03/04/17   [provider]  nitroGLYCERIN (NITROSTAT) 0.4 MG SL tablet Place 0.4 mg under the tongue every 5 (five) minutes as needed for chest pain.    [provider]  ondansetron (ZOFRAN) 8 MG tablet Take 8 mg by mouth 2 (two) times daily as needed for nausea or vomiting.    [provider]  pantoprazole (PROTONIX) 40 MG tablet Take 40 mg by mouth daily.    [provider]  Polyethyl Glycol-Propyl Glycol (SYSTANE) 0.4-0.3 % SOLN Apply 1 drop to eye 2 (two) times daily.    [provider]  polyethylene glycol (MIRALAX / GLYCOLAX) packet Take 17 g by mouth daily. 05/04/18   Amin, Loura Halt, MD  prednisoLONE acetate (PRED FORTE) 1 % ophthalmic suspension Place 1 drop into the right eye daily.  03/19/17   [provider]  senna-docusate (SENOKOT-S) 8.6-50 MG tablet Take 1 tablet by mouth daily as needed for mild constipation.    [provider]  timolol (BETIMOL) 0.5 % ophthalmic solution Place 1 drop into both eyes 2 (two) times daily.    [provider]  traZODone (DESYREL) 50 MG tablet Take 50 mg by mouth at bedtime as needed for sleep.    [provider]  vitamin B-12 (CYANOCOBALAMIN) 1000 MCG tablet Take 1,000 mcg by mouth daily.    [provider]  vitamin C (ASCORBIC ACID) 500 MG tablet Take 500 mg by mouth 2 (two) times daily.    [provider]    Physical Exam: Vitals:   08/13/19 2045 08/13/19 2100 08/13/19 2115 08/13/19 2130  BP:  111/85  131/80  Pulse: 92  91 94 92  Resp: 14 19 17 14   Temp:      TempSrc:      SpO2: 98% 95% 99% 93%  Weight:      Height:        Constitutional: NAD, calm, comfortable Eyes: PERRL, lids and conjunctivae normal ENMT: Mucous membranes are moist. Posterior pharynx clear of any exudate or lesions.Normal dentition.  Neck: normal, supple, no masses, no thyromegaly Respiratory: clear to auscultation bilaterally, no wheezing, no crackles. Normal respiratory effort. No accessory muscle use.  Cardiovascular: Regular rate and rhythm, no murmurs / rubs / gallops. No extremity edema. 2+ pedal pulses. No  carotid bruits.  Abdomen: no tenderness, no masses palpated. No hepatosplenomegaly. Bowel sounds positive.  Musculoskeletal: no clubbing / cyanosis. No joint deformity upper and lower extremities. Good ROM, no contractures. Normal muscle tone.  Skin: no rashes, lesions, ulcers. No induration Neurologic: CN 2-12 grossly intact. Sensation intact, DTR normal. Strength 5/5 in all 4.  Psychiatric: Oriented to self only.   Labs on Admission: I have personally reviewed following labs and imaging studies  CBC: Recent Labs  Lab 08/13/19 1652  WBC 11.2*  HGB 13.0  HCT 39.2  MCV 100.5*  PLT 161   Basic Metabolic Panel: Recent Labs  Lab 08/13/19 1652  NA 139  K 4.3  CL 107  CO2 21*  GLUCOSE 253*  BUN 25*  CREATININE 0.72  CALCIUM 8.4*   GFR: Estimated Creatinine Clearance: 42.8 mL/min (by C-G formula based on SCr of 0.72 mg/dL). Liver Function Tests: No results for input(s): AST, ALT, ALKPHOS, BILITOT, PROT, ALBUMIN in the last 168 hours. No results for input(s): LIPASE, AMYLASE in the last 168 hours. No results for input(s): AMMONIA in the last 168 hours. Coagulation Profile: No results for input(s): INR, PROTIME in the last 168 hours. Cardiac Enzymes: No results for input(s): CKTOTAL, CKMB, CKMBINDEX, TROPONINI in the last 168 hours. BNP (last 3 results) No results for input(s): PROBNP in the last 8760  hours. HbA1C: No results for input(s): HGBA1C in the last 72 hours. CBG: Recent Labs  Lab 08/13/19 1654  GLUCAP 229*   Lipid Profile: No results for input(s): CHOL, HDL, LDLCALC, TRIG, CHOLHDL, LDLDIRECT in the last 72 hours. Thyroid Function Tests: No results for input(s): TSH, T4TOTAL, FREET4, T3FREE, THYROIDAB in the last 72 hours. Anemia Panel: No results for input(s): VITAMINB12, FOLATE, FERRITIN, TIBC, IRON, RETICCTPCT in the last 72 hours. Urine analysis:    Component Value Date/Time   COLORURINE AMBER (A) 08/13/2019 2030   APPEARANCEUR CLEAR 08/13/2019 2030   LABSPEC 1.020 08/13/2019 2030   PHURINE 5.0 08/13/2019 2030   GLUCOSEU >=500 (A) 08/13/2019 2030   HGBUR NEGATIVE 08/13/2019 2030   [place] NEGATIVE 08/13/2019 2030   KETONESUR 5 (A) 08/13/2019 2030   PROTEINUR NEGATIVE 08/13/2019 2030   UROBILINOGEN 0.2 03/19/2015 1945   NITRITE NEGATIVE 08/13/2019 2030   LEUKOCYTESUR NEGATIVE 08/13/2019 2030    Radiological Exams on Admission: CT HEAD WO CONTRAST  Result Date: 08/13/2019 CLINICAL DATA:  Altered mental status. EXAM: CT HEAD WITHOUT CONTRAST TECHNIQUE: Contiguous axial images were obtained from the base of the skull through the vertex without intravenous contrast. COMPARISON:  August 05, 2019 FINDINGS: Brain: There is mild to moderate severity cerebral atrophy with widening of the extra-axial spaces and ventricular dilatation. There are areas of decreased attenuation within the white matter tracts of the supratentorial brain, consistent with microvascular disease changes. Vascular: No hyperdense vessel or unexpected calcification. Skull: Normal. Negative for fracture or focal lesion. Sinuses/Orbits: No acute finding. Other: None. IMPRESSION: 1. Generalized cerebral atrophy. 2. No acute intracranial abnormality. Electronically Signed   By: Virgina Norfolk M.D.   On: 08/13/2019 18:46   DG Chest Portable 1 View  Result Date: 08/13/2019 CLINICAL DATA:  Generalized  weakness for 2 days. EXAM: PORTABLE CHEST 1 VIEW COMPARISON:  June 22, 2019 FINDINGS: Mild, chronic appearing increased interstitial lung markings are seen which are stable in severity. Mild, stable linear scarring and/or atelectasis is seen within the bilateral lung bases. There is no evidence of a pleural effusion or pneumothorax. The heart size and mediastinal contours are within normal  limits. There is marked severity calcification of the thoracic aorta. Multilevel degenerative changes seen throughout the thoracic spine. IMPRESSION: 1. Chronic appearing increased interstitial lung markings with mild, stable bibasilar linear scarring and/or atelectasis. Electronically Signed   By: Aram Candela M.D.   On: 08/13/2019 18:02    EKG: Independently reviewed.  Assessment/Plan Principal Problem:   Generalized weakness Active Problems:   Non-insulin treated type 2 diabetes mellitus (HCC)   Essential hypertension   Chronic anticoagulation   Chronic diastolic heart failure (HCC)   PAF (paroxysmal atrial fibrillation) (HCC)   Orthostatic hypotension    1. Generalized weakness - 1. DDx includes, but not limited to, partially treated UTI, critical illness neuropathy due to ongoing decadron use (looks like this wasn't ever stopped since discharge in Dec). 2. Cont nitrofurantoin for UTI diagnosed at SNF, urine doesn't look infected today however 3. Start tapering Decadron, will put in for 4mg  for tomorrow and pharm consult to come up with a taper. 4. New Q wave in lead III (S1, QT3 pattern), not present in 2019  1. Continue chronic eliquis 2. Obtain 2D echo 3. Check D.Dimer 2. Orthostatic hypotension - due to decreased PO intake most likely 1. IVF: 2L bolus in ED + 75 cc/hr NS 3. Acute encephalopathy on top of chronic dementia - 1. Patient with sundowning currently 2. See #1 above 4. DM2 - 1. Sensitive SSI AC/HS 5. HTN - 1. Cont home BP meds 2. But will hold labetalol 6. PAF - 1. Cont  amiodarone 2. Cont amlodipine 3. Tele monitor 4. Cont eliquis  DVT prophylaxis: Eliquis Code Status: Full Family Communication: No family in room Disposition Plan: Home after admit Consults called: None Admission status: Place in 59    Danel Studzinski M. DO [facility]  How to contact the [facility] Attending or Consulting provider 7A - 7P or covering provider during after hours 7P -7A, for this patient?  1. Check the care team in [facility] and look for a) attending/consulting [facility] provider listed and b) the [facility] team listed 2. Log into [email or web address]  Amion Physician Scheduling and messaging for groups and whole hospitals  On call and physician scheduling software for group practices, residents, hospitalists and other medical providers for call, clinic, rotation and shift schedules. On[employer] is a hospital-wide system for scheduling doctors and paging doctors on call. EasyPlot is for scientific plotting and data analysis.  [email or web address]  and use [facility]'s universal password to access. If you do not have the password, please contact the hospital operator.  3. Locate the [facility] provider you are looking for under [facility] and page to a number that you can be directly reached. 4. If you still have difficulty reaching the provider, please page the [facility] (Director on Call) for the Hospitalists listed on amion for assistance.  08/13/2019, 10:17 PM

## 2019-08-13 NOTE — ED Triage Notes (Signed)
Pt arrived via [facility] from [facility] assisted Living, staff report pt has had generalized weaknessx2 days and hasn't ate or drank in 2-3 days. Pt is A&Ox1 to self only. EMS gave NS . EMS stated pt standing bp 84/46. VS WNL. Pt NSR on monitor

## 2019-08-13 NOTE — ED Provider Notes (Signed)
[facility] EMERGENCY DEPARTMENT Provider Note   CSN: [id number] Arrival date & time: 08/13/19  1651     History Chief Complaint  Patient presents with  . Weakness    Tracey Porter is a 86 y.o. female.  Pt presents to the ED today from [facility] [place].  Staff reports that pt has had weakness for 2 days and has not eaten or drank anything in 2-3 days.  Pt was orthostatic, so EMS gave her 1L NS en route.  Pt denies any specific pain, she just feels weak.  She did have Covid on 12/11 and was admitted to the hospital for hypoxia.    Pt is a retired Engineer, civil (consulting).        Past Medical History:  Diagnosis Date  . Diabetes mellitus without complication (HCC)   . Hypertension     Patient Active Problem List   Diagnosis Date Noted  . Acute respiratory disease due to COVID-19 virus 06/23/2019  . Acute respiratory failure with hypoxia (HCC) 06/23/2019  . Displaced fracture of right femoral neck (HCC) 04/30/2018  . Chronic back pain 10/10/2017  . Dyslipidemia 10/10/2017  . PAF (paroxysmal atrial fibrillation) (HCC) 07/28/2017  . Orthostatic hypotension 07/28/2017  . History of GI bleed 07/28/2017  . Chronic diastolic heart failure (HCC)   . Multiple fractures of pelvis with unstable disruption of pelvic ring, initial encounter for closed fracture (HCC) 03/20/2017  . Atrial fibrillation with RVR (HCC) 03/20/2017  . Aortic atherosclerosis (HCC) 03/20/2017  . Hypotension due to blood loss   . Chronic anticoagulation   . Closed pelvic fracture (HCC) 03/19/2017  . Leukocytosis 03/19/2017  . Fall at home, initial encounter 03/19/2017  . Paroxysmal SVT (supraventricular tachycardia) (HCC)   . Essential hypertension   . Non-insulin treated type 2 diabetes mellitus (HCC) 03/20/2015  . Hypokalemia 03/19/2015    Past Surgical History:  Procedure Laterality Date  . ABDOMINAL HYSTERECTOMY  1976  . EYE SURGERY    . HIP ARTHROPLASTY Right 05/03/2018   Procedure:  ARTHROPLASTY BIPOLAR HIP (HEMIARTHROPLASTY);  Surgeon: Roby Lofts, MD;  Location: [facility] OR;  Service: Orthopedics;  Laterality: Right;     OB History   No obstetric history on file.     Family History  Problem Relation Age of Onset  . Lung cancer Father   . Lymphoma Daughter     Social History   Tobacco Use  . Smoking status: Former Games developer  . Smokeless tobacco: Never Used  Substance Use Topics  . Alcohol use: Yes  . Drug use: No    Home Medications Prior to Admission medications   Medication Sig Start Date End Date Taking? Authorizing Provider  acetaminophen (TYLENOL) 500 MG tablet Take 1,000 mg by mouth See admin instructions. 1000 mg twice a day and 500 mg every 4 hours as needed for pain/headache    [provider]  acyclovir (ZOVIRAX) 800 MG tablet Take 800 mg by mouth 2 (two) times daily.    [provider]  amiodarone (PACERONE) 200 MG tablet Take 200 mg by mouth daily.    [provider]  amLODipine (NORVASC) 5 MG tablet Take 5 mg by mouth daily.    [provider]  apixaban (ELIQUIS) 5 MG TABS tablet Take 1 tablet (5 mg total) by mouth 2 (two) times daily. Please cancel previous rx's sent; call pt with price before filling 02/24/17   Azalee Course, PA  Cholecalciferol (VITAMIN D) 125 MCG (5000 UT) CAPS Take 5,000 Units by  mouth daily.    [provider]  conjugated estrogens (PREMARIN) vaginal cream Place 1 Applicatorful vaginally every other day.    [provider]  dexamethasone (DECADRON) 6 MG tablet Take 1 tablet (6 mg total) by mouth daily. 06/29/19   Pokhrel, Rebekah Chesterfield, MD  diltiazem (CARDIZEM CD) 120 MG 24 hr capsule Take 1 capsule (120 mg total) by mouth daily. 05/30/18   Lennette Bihari, MD  ferrous sulfate 325 (65 FE) MG tablet Take 325 mg by mouth daily with breakfast.    [provider]  fluconazole (DIFLUCAN) 200 MG tablet Take 200 mg by mouth daily. 07/31/19 08/13/19  [provider]   guaiFENesin-dextromethorphan (ROBITUSSIN DM) 100-10 MG/5ML syrup Take 5 mLs by mouth every 4 (four) hours as needed for cough. 06/29/19   Pokhrel, Rebekah Chesterfield, MD  labetalol (NORMODYNE) 100 MG tablet Take 100-200 mg by mouth See admin instructions. Take 2 tablets every morning and take 1 tablet at bedtime    [provider]  labetalol (NORMODYNE) 200 MG tablet Take 1 tablet (200 mg total) by mouth every morning AND 0.5 tablets (100 mg total) at bedtime. Patient not taking: Reported on 06/23/2019 05/30/18   Lennette Bihari, MD  Lactobacillus Rhamnosus, GG, (CULTURELLE KIDS) CHEW Chew 1 tablet by mouth daily.    [provider]  lidocaine (LIDODERM) 5 % Place 1 patch onto the skin as needed (for pain).    [provider]  loperamide (IMODIUM) 2 MG capsule Take 1 capsule (2 mg total) by mouth as needed for diarrhea or loose stools. 03/31/17   Tyrone Nine, MD  magnesium chloride (SLOW-MAG) 64 MG TBEC SR tablet Take 64 mg by mouth daily.     [provider]  Melatonin 3 MG TABS Take 1 tablet (3 mg total) by mouth at bedtime. 06/29/19   Pokhrel, Rebekah Chesterfield, MD  Multiple Vitamins-Minerals (ICAPS PLUS) TABS Take 1 tablet by mouth 2 (two) times daily.    [provider]  MYRBETRIQ 25 MG TB24 tablet Take 25 mg by mouth daily. 03/04/17   [provider]  nitroGLYCERIN (NITROSTAT) 0.4 MG SL tablet Place 0.4 mg under the tongue every 5 (five) minutes as needed for chest pain.    [provider]  ondansetron (ZOFRAN) 8 MG tablet Take 8 mg by mouth 2 (two) times daily as needed for nausea or vomiting.    [provider]  pantoprazole (PROTONIX) 40 MG tablet Take 40 mg by mouth daily.    [provider]  Polyethyl Glycol-Propyl Glycol (SYSTANE) 0.4-0.3 % SOLN Apply 1 drop to eye 2 (two) times daily.    [provider]  polyethylene glycol (MIRALAX / GLYCOLAX) packet Take 17 g by mouth daily. 05/04/18   Amin, Loura Halt, MD   prednisoLONE acetate (PRED FORTE) 1 % ophthalmic suspension Place 1 drop into the right eye daily.  03/19/17   [provider]  senna-docusate (SENOKOT-S) 8.6-50 MG tablet Take 1 tablet by mouth daily as needed for mild constipation.    [provider]  timolol (BETIMOL) 0.5 % ophthalmic solution Place 1 drop into both eyes 2 (two) times daily.    [provider]  traZODone (DESYREL) 50 MG tablet Take 50 mg by mouth at bedtime as needed for sleep.    [provider]  vitamin B-12 (CYANOCOBALAMIN) 1000 MCG tablet Take 1,000 mcg by mouth daily.    [provider]  vitamin C (ASCORBIC ACID) 500 MG tablet Take 500 mg by mouth  2 (two) times daily.    [provider]    Allergies    Penicillins  Review of Systems   Review of Systems  Neurological: Positive for weakness.  All other systems reviewed and are negative.   Physical Exam Updated Vital Signs BP 131/80   Pulse 92   Temp (!) 97.3 F (36.3 C) (Oral)   Resp 14   Ht 5\' 4"  (1.626 m)   Wt 63.5 kg   SpO2 93%   BMI 24.03 kg/m   Physical Exam Vitals and nursing note reviewed.  Constitutional:      Appearance: Normal appearance.  HENT:     Head: Normocephalic and atraumatic.     Right Ear: External ear normal.     Left Ear: External ear normal.     Nose: Nose normal.     Mouth/Throat:     Mouth: Mucous membranes are dry.  Eyes:     Extraocular Movements: Extraocular movements intact.     Conjunctiva/sclera: Conjunctivae normal.     Pupils: Pupils are equal, round, and reactive to light.  Cardiovascular:     Rate and Rhythm: Normal rate and regular rhythm.     Pulses: Normal pulses.     Heart sounds: Murmur present.  Pulmonary:     Effort: Pulmonary effort is normal.     Breath sounds: Normal breath sounds.  Abdominal:     General: Abdomen is flat. Bowel sounds are normal.     Palpations: Abdomen is soft.  Musculoskeletal:        General: Normal range of motion.      Cervical back: Normal range of motion and neck supple.  Skin:    General: Skin is warm.     Capillary Refill: Capillary refill takes less than 2 seconds.  Neurological:     General: No focal deficit present.     Mental Status: She is alert and oriented to person, place, and time.  Psychiatric:        Mood and Affect: Mood normal.        Behavior: Behavior normal.        Thought Content: Thought content normal.        Judgment: Judgment normal.     ED Results / Procedures / Treatments   Labs (all labs ordered are listed, but only abnormal results are displayed) Labs Reviewed  BASIC METABOLIC PANEL - Abnormal; Notable for the following components:      Result Value   CO2 21 (*)    Glucose, Bld 253 (*)    BUN 25 (*)    Calcium 8.4 (*)    All other components within normal limits  CBC - Abnormal; Notable for the following components:   WBC 11.2 (*)    MCV 100.5 (*)    RDW 15.9 (*)    All other components within normal limits  URINALYSIS, COMPLETE (UACMP) WITH MICROSCOPIC - Abnormal; Notable for the following components:   Color, Urine AMBER (*)    Glucose, UA >=500 (*)    Ketones, ur 5 (*)    All other components within normal limits  CBG MONITORING, ED - Abnormal; Notable for the following components:   Glucose-Capillary 229 (*)    All other components within normal limits  TROPONIN I (HIGH SENSITIVITY) - Abnormal; Notable for the following components:   Troponin I (High Sensitivity) 36 (*)    All other components within normal limits  TROPONIN I (HIGH SENSITIVITY)    EKG EKG Interpretation  Date/Time:  Monday August 13 2019 16:52:17 EST Ventricular Rate:  83 PR Interval:    QRS Duration: 97 QT Interval:  395 QTC Calculation: 465 R Axis:   51 Text Interpretation: Sinus rhythm No significant change since Confirmed by Jacalyn Lefevre [phone]) on 08/13/2019 4:56:01 PM   Radiology CT HEAD WO CONTRAST  Result Date: 08/13/2019 CLINICAL DATA:  Altered mental status.  EXAM: CT HEAD WITHOUT CONTRAST TECHNIQUE: Contiguous axial images were obtained from the base of the skull through the vertex without intravenous contrast. COMPARISON:  August 05, 2019 FINDINGS: Brain: There is mild to moderate severity cerebral atrophy with widening of the extra-axial spaces and ventricular dilatation. There are areas of decreased attenuation within the white matter tracts of the supratentorial brain, consistent with microvascular disease changes. Vascular: No hyperdense vessel or unexpected calcification. Skull: Normal. Negative for fracture or focal lesion. Sinuses/Orbits: No acute finding. Other: None. IMPRESSION: 1. Generalized cerebral atrophy. 2. No acute intracranial abnormality. Electronically Signed   By: Aram Candela M.D.   On: 08/13/2019 18:46   DG Chest Portable 1 View  Result Date: 08/13/2019 CLINICAL DATA:  Generalized weakness for 2 days. EXAM: PORTABLE CHEST 1 VIEW COMPARISON:  June 22, 2019 FINDINGS: Mild, chronic appearing increased interstitial lung markings are seen which are stable in severity. Mild, stable linear scarring and/or atelectasis is seen within the bilateral lung bases. There is no evidence of a pleural effusion or pneumothorax. The heart size and mediastinal contours are within normal limits. There is marked severity calcification of the thoracic aorta. Multilevel degenerative changes seen throughout the thoracic spine. IMPRESSION: 1. Chronic appearing increased interstitial lung markings with mild, stable bibasilar linear scarring and/or atelectasis. Electronically Signed   By: Aram Candela M.D.   On: 08/13/2019 18:02    Procedures Procedures (including critical care time)  Medications Ordered in ED Medications  0.9 %  sodium chloride infusion ( Intravenous New Bag/Given 08/13/19 1747)  0.9 %  sodium chloride infusion (has no administration in time range)  sodium chloride 0.9 % bolus 1,000 mL (0 mLs Intravenous Stopped 08/13/19 1829)     ED Course  I have reviewed the triage vital signs and the nursing notes.  Pertinent labs & imaging results that were available during my care of the patient were reviewed by me and considered in my medical decision making (see chart for details).    MDM Rules/Calculators/A&P                     Pt becoming more confused.  She pulled out her IV and took off her robe.  Sundowning?  Pt's nurse cleaned her up and restarted IV.  No active infection.  No signs of sepsis.  She was Covid + in December.  She is not hypoxic now.  Pt remains orthostatic after 2L NS.  I am reluctant to give her more fluid boluses due to her age.  I will put her on a NS infusion.  Pt's daughter updated.    Pt d/w Dr. Julian Reil ([facility]) for admission.  Final Clinical Impression(s) / ED Diagnoses Final diagnoses:  Orthostatic hypotension  Failure to thrive in adult  Sundowning    Rx / DC Orders ED Discharge Orders    None       Jacalyn Lefevre, MD 08/13/19 2148

## 2019-08-14 ENCOUNTER — Observation Stay (HOSPITAL_BASED_OUTPATIENT_CLINIC_OR_DEPARTMENT_OTHER): Payer: Medicare Other

## 2019-08-14 DIAGNOSIS — I480 Paroxysmal atrial fibrillation: Secondary | ICD-10-CM | POA: Diagnosis present

## 2019-08-14 DIAGNOSIS — R9431 Abnormal electrocardiogram [ECG] [EKG]: Secondary | ICD-10-CM | POA: Diagnosis present

## 2019-08-14 DIAGNOSIS — Z87891 Personal history of nicotine dependence: Secondary | ICD-10-CM | POA: Diagnosis not present

## 2019-08-14 DIAGNOSIS — I371 Nonrheumatic pulmonary valve insufficiency: Secondary | ICD-10-CM

## 2019-08-14 DIAGNOSIS — L8991 Pressure ulcer of unspecified site, stage 1: Secondary | ICD-10-CM | POA: Diagnosis present

## 2019-08-14 DIAGNOSIS — R531 Weakness: Secondary | ICD-10-CM | POA: Diagnosis not present

## 2019-08-14 DIAGNOSIS — G9340 Encephalopathy, unspecified: Secondary | ICD-10-CM | POA: Diagnosis present

## 2019-08-14 DIAGNOSIS — I110 Hypertensive heart disease with heart failure: Secondary | ICD-10-CM | POA: Diagnosis present

## 2019-08-14 DIAGNOSIS — I340 Nonrheumatic mitral (valve) insufficiency: Secondary | ICD-10-CM | POA: Diagnosis not present

## 2019-08-14 DIAGNOSIS — R627 Adult failure to thrive: Secondary | ICD-10-CM | POA: Diagnosis present

## 2019-08-14 DIAGNOSIS — L8990 Pressure ulcer of unspecified site, unspecified stage: Secondary | ICD-10-CM | POA: Diagnosis present

## 2019-08-14 DIAGNOSIS — E876 Hypokalemia: Secondary | ICD-10-CM | POA: Diagnosis not present

## 2019-08-14 DIAGNOSIS — Z6825 Body mass index (BMI) 25.0-25.9, adult: Secondary | ICD-10-CM | POA: Diagnosis not present

## 2019-08-14 DIAGNOSIS — E119 Type 2 diabetes mellitus without complications: Secondary | ICD-10-CM | POA: Diagnosis present

## 2019-08-14 DIAGNOSIS — I5032 Chronic diastolic (congestive) heart failure: Secondary | ICD-10-CM | POA: Diagnosis present

## 2019-08-14 DIAGNOSIS — F0390 Unspecified dementia without behavioral disturbance: Secondary | ICD-10-CM | POA: Diagnosis present

## 2019-08-14 DIAGNOSIS — Z880 Allergy status to penicillin: Secondary | ICD-10-CM | POA: Diagnosis not present

## 2019-08-14 DIAGNOSIS — F05 Delirium due to known physiological condition: Secondary | ICD-10-CM | POA: Diagnosis present

## 2019-08-14 DIAGNOSIS — R339 Retention of urine, unspecified: Secondary | ICD-10-CM | POA: Diagnosis present

## 2019-08-14 DIAGNOSIS — Z8619 Personal history of other infectious and parasitic diseases: Secondary | ICD-10-CM | POA: Diagnosis not present

## 2019-08-14 DIAGNOSIS — F329 Major depressive disorder, single episode, unspecified: Secondary | ICD-10-CM | POA: Diagnosis present

## 2019-08-14 DIAGNOSIS — Z79899 Other long term (current) drug therapy: Secondary | ICD-10-CM | POA: Diagnosis not present

## 2019-08-14 DIAGNOSIS — Z20822 Contact with and (suspected) exposure to covid-19: Secondary | ICD-10-CM | POA: Diagnosis present

## 2019-08-14 DIAGNOSIS — T462X5A Adverse effect of other antidysrhythmic drugs, initial encounter: Secondary | ICD-10-CM | POA: Diagnosis not present

## 2019-08-14 DIAGNOSIS — R7989 Other specified abnormal findings of blood chemistry: Secondary | ICD-10-CM | POA: Diagnosis not present

## 2019-08-14 DIAGNOSIS — I951 Orthostatic hypotension: Secondary | ICD-10-CM | POA: Diagnosis present

## 2019-08-14 DIAGNOSIS — G9341 Metabolic encephalopathy: Secondary | ICD-10-CM | POA: Diagnosis present

## 2019-08-14 DIAGNOSIS — Z7901 Long term (current) use of anticoagulants: Secondary | ICD-10-CM | POA: Diagnosis not present

## 2019-08-14 LAB — ECHOCARDIOGRAM COMPLETE
Height: 64 in
Weight: 2239.87 oz

## 2019-08-14 LAB — CBC
HCT: 40.6 % (ref 36.0–46.0)
Hemoglobin: 13.6 g/dL (ref 12.0–15.0)
MCH: 32.9 pg (ref 26.0–34.0)
MCHC: 33.5 g/dL (ref 30.0–36.0)
MCV: 98.1 fL (ref 80.0–100.0)
Platelets: 196 10*3/uL (ref 150–400)
RBC: 4.14 MIL/uL (ref 3.87–5.11)
RDW: 15.9 % — ABNORMAL HIGH (ref 11.5–15.5)
WBC: 12.3 10*3/uL — ABNORMAL HIGH (ref 4.0–10.5)
nRBC: 0 % (ref 0.0–0.2)

## 2019-08-14 LAB — GLUCOSE, CAPILLARY
Glucose-Capillary: 204 mg/dL — ABNORMAL HIGH (ref 70–99)
Glucose-Capillary: 136 mg/dL — ABNORMAL HIGH (ref 70–99)
Glucose-Capillary: 210 mg/dL — ABNORMAL HIGH (ref 70–99)
Glucose-Capillary: 207 mg/dL — ABNORMAL HIGH (ref 70–99)

## 2019-08-14 LAB — BASIC METABOLIC PANEL
Anion gap: 11 (ref 5–15)
BUN: 17 mg/dL (ref 8–23)
CO2: 24 mmol/L (ref 22–32)
Calcium: 8.6 mg/dL — ABNORMAL LOW (ref 8.9–10.3)
Chloride: 104 mmol/L (ref 98–111)
Creatinine, Ser: 0.35 mg/dL — ABNORMAL LOW (ref 0.44–1.00)
GFR calc Af Amer: >60 mL/min (ref >60)
GFR calc non Af Amer: >60 mL/min (ref >60)
Glucose, Bld: 154 mg/dL — ABNORMAL HIGH (ref 70–99)
Potassium: 3.5 mmol/L (ref 3.5–5.1)
Sodium: 139 mmol/L (ref 135–145)

## 2019-08-14 LAB — D-DIMER, QUANTITATIVE: D-Dimer, Quant: 0.28 ug/mL-FEU (ref 0.00–0.50)

## 2019-08-14 MED ORDER — SODIUM CHLORIDE 0.9 % IV SOLN: INTRAVENOUS | Status: AC

## 2019-08-14 MED ORDER — BISACODYL 10 MG RE SUPP
10.0000 mg | Freq: Once | RECTAL | Status: AC
  Administered 2019-08-14: 10 mg via RECTAL
  Filled 2019-08-14: qty 1

## 2019-08-14 NOTE — Progress Notes (Signed)
  Echocardiogram 2D Echocardiogram has been performed.  Gerda Diss 08/14/2019, 9:33 AM

## 2019-08-14 NOTE — Plan of Care (Signed)
  Problem: Education: Goal: Knowledge of General Education information will improve Description: Including pain rating scale, medication(s)/side effects and non-pharmacologic comfort measures Outcome: Progressing   Problem: Clinical Measurements: Goal: Ability to maintain clinical measurements within normal limits will improve Outcome: Progressing   

## 2019-08-14 NOTE — Plan of Care (Signed)
  Problem: Clinical Measurements: Goal: Cardiovascular complication will be avoided Outcome: Progressing   

## 2019-08-14 NOTE — Progress Notes (Addendum)
Progress Note    Tracey Porter  CNO:[id number] DOB: 11-Jun-1930  DOA: 08/13/2019 PCP: Florentina Jenny, MD    Brief Narrative:   Medical records reviewed and are as summarized below:  Tracey Porter is an 86 y.o. female from [place] with a past medical history significant for dementia, diet-controlled diabetes type 2, hypertension, paroxysmal atrial fibrillation on Eliquis, hospitalization 2 months ago for COVID-19 pneumonia presents to emergency department chief complaint generalized weakness and decreased oral intake in the setting of a urinary tract infection.  Work-up reveals orthostatic hypotension acute encephalopathy in setting of dementia, unremarkable urinalysis.  Assessment/Plan:   Principal Problem:   Generalized weakness Active Problems:   Orthostatic hypotension   Acute encephalopathy   Abnormal EKG   Urinary retention   Non-insulin treated type 2 diabetes mellitus (HCC)   Essential hypertension   Chronic anticoagulation   Chronic diastolic heart failure (HCC)   PAF (paroxysmal atrial fibrillation) (HCC)   Pressure injury of skin  #1.  Generalized weakness/acute encephalopathy on top of chronic dementia.  Etiology unclear I suspect is multifactorial specifically recent antibiotics for urinary tract infection (macrobid),ongoing Decadron use, orthostatic hypotension in setting of decreased oral intake.  D-dimer within limits of normal. HS trops slightly elevated but flat.  CT of the head without acute abnormality.  No metabolic derangement.  She does have a mild leukocytosis but is afebrile hemodynamically stable and nontoxic-appearing.  This morning she is awake alert responds appropriately to simple commands. -Stop Macrobid -Continue gentle IV fluids as she appears quite dry - MAR from [facility] indicates patient has indeed been receiving Decadron 6 mg since her discharge December 18. Have requested pharmacy propose a tapering schedule.  -Begin Decadron  taper per pharmacy.  Of note recommend discharge summary clearly state tapering schedule under recommendations as well as in the narrative to minimize chance of this being missed -PT and OT evaluation -Encourage p.o. intake  #2.  Orthostatic hypotension.  She has a history of hypertension.  Home medications include amiodarone, Norvasc, diltiazem, labetalol.  He was quite orthostatic on admission in spite of 2 L of normal saline.  Her home medications were resumed -Stop Norvasc and continue amiodarone and diltiazem. -Recheck orthostatics -Continue gentle IV fluids  #3.  Paroxysmal A. fib.  Home medications include beta-blocker and Eliquis and Cardizem.  EKG on admission reveals sinus rhythm with no changes from previous. -Continue Diltiazem and amiodarone -Continue Eliquis  #4.  Chronic diastolic heart failure.  Does not appear overloaded.  In fact she appears little dry.  Medications as noted above.  Echo reveals EF of 65 to 70% with severely increased left ventricular wall thickness rate 1 diastolic dysfunction -Daily weights -Monitor intake and output  #5.pressure injury of skin -wound consut-  #6.  Urinary retention.  Bedside bladder scan revealed greater than 900 cc of urine.  In and out cath yielded 1200 cc of urine.  Patient also reportedly had 600 cc in the pure wick canister.  Note she did receive 2 L normal saline for #2. -We will check bladder scan every 8 hours -As needed in and out cath -Continue gentle IV fluids -Stop myrbetric -dulculax supp  Family Communication/Anticipated D/C date and plan/Code Status   DVT prophylaxis: Lovenox ordered. Code Status: Full Code.  Family Communication: spoke to [facility] and daughter per phone Disposition Plan: back to facility   Medical Consultants:    None.   Anti-Infectives:    None  Subjective:   Lying in bed  alert oriented to self.  Denies pain.  Objective:    Vitals:   08/14/19 0100 08/14/19 0213 08/14/19 0314  08/14/19 0831  BP: (!) 147/94 137/83 (!) 145/86 122/75  Pulse: 99 96 96 97  Resp: 13 18 18 16   Temp:  (!) 97.5 F (36.4 C) 97.8 F (36.6 C) (!) 97.3 F (36.3 C)  TempSrc:  Oral Oral Oral  SpO2: 98% 97%  95%  Weight:      Height:        Intake/Output Summary (Last 24 hours) at 08/14/2019 1244 Last data filed at 08/14/2019 1221 Gross per 24 hour  Intake 1467.5 ml  Output 1450 ml  Net 17.5 ml   Filed Weights   08/13/19 1700  Weight: 63.5 kg    Exam: General.  Awake alert slightly pale no acute distress CV: Regular rate and rhythm no murmur gallop or rub no lower extremity edema Respiratory: No increased work of breathing breath sounds are clear bilaterally I hear no crackles no wheeze Abdomen: Nondistended soft positive bowel sounds throughout no guarding or rebounding Neuro: Alert and oriented to self.  Bilateral grip 5 out of 5 speech is slow clear mouth is dry   Data Reviewed:   I have personally reviewed following labs and imaging studies:  Labs: Labs show the following:   Basic Metabolic Panel: Recent Labs  Lab 08/13/19 1652 08/14/19 0400  NA 139 139  K 4.3 3.5  CL 107 104  CO2 21* 24  GLUCOSE 253* 154*  BUN 25* 17  CREATININE 0.72 0.35*  CALCIUM 8.4* 8.6*   GFR Estimated Creatinine Clearance: 42.8 mL/min (A) (by C-G formula based on SCr of 0.35 mg/dL (L)). Liver Function Tests: No results for input(s): AST, ALT, ALKPHOS, BILITOT, PROT, ALBUMIN in the last 168 hours. No results for input(s): LIPASE, AMYLASE in the last 168 hours. No results for input(s): AMMONIA in the last 168 hours. Coagulation profile No results for input(s): INR, PROTIME in the last 168 hours.  CBC: Recent Labs  Lab 08/13/19 1652 08/14/19 0400  WBC 11.2* 12.3*  HGB 13.0 13.6  HCT 39.2 40.6  MCV 100.5* 98.1  PLT 217 196   Cardiac Enzymes: No results for input(s): CKTOTAL, CKMB, CKMBINDEX, TROPONINI in the last 168 hours. BNP (last 3 results) No results for input(s):  PROBNP in the last 8760 hours. CBG: Recent Labs  Lab 08/13/19 1654 08/14/19 0219 08/14/19 0607  GLUCAP 229* 204* 136*   D-Dimer: Recent Labs    08/14/19 0400  DDIMER 0.28   Hgb A1c: No results for input(s): HGBA1C in the last 72 hours. Lipid Profile: No results for input(s): CHOL, HDL, LDLCALC, TRIG, CHOLHDL, LDLDIRECT in the last 72 hours. Thyroid function studies: No results for input(s): TSH, T4TOTAL, T3FREE, THYROIDAB in the last 72 hours.  Invalid input(s): FREET3 Anemia work up: No results for input(s): VITAMINB12, FOLATE, FERRITIN, TIBC, IRON, RETICCTPCT in the last 72 hours. Sepsis Labs: Recent Labs  Lab 08/13/19 1652 08/14/19 0400  WBC 11.2* 12.3*    Microbiology No results found for this or any previous visit (from the past 240 hour(s)).  Procedures and diagnostic studies:  CT HEAD WO CONTRAST  Result Date: 08/13/2019 CLINICAL DATA:  Altered mental status. EXAM: CT HEAD WITHOUT CONTRAST TECHNIQUE: Contiguous axial images were obtained from the base of the skull through the vertex without intravenous contrast. COMPARISON:  August 05, 2019 FINDINGS: Brain: There is mild to moderate severity cerebral atrophy with widening of the extra-axial spaces and ventricular  dilatation. There are areas of decreased attenuation within the white matter tracts of the supratentorial brain, consistent with microvascular disease changes. Vascular: No hyperdense vessel or unexpected calcification. Skull: Normal. Negative for fracture or focal lesion. Sinuses/Orbits: No acute finding. Other: None. IMPRESSION: 1. Generalized cerebral atrophy. 2. No acute intracranial abnormality. Electronically Signed   By: Aram Candelahaddeus  Houston M.D.   On: 08/13/2019 18:46   DG Chest Portable 1 View  Result Date: 08/13/2019 CLINICAL DATA:  Generalized weakness for 2 days. EXAM: PORTABLE CHEST 1 VIEW COMPARISON:  June 22, 2019 FINDINGS: Mild, chronic appearing increased interstitial lung markings are seen  which are stable in severity. Mild, stable linear scarring and/or atelectasis is seen within the bilateral lung bases. There is no evidence of a pleural effusion or pneumothorax. The heart size and mediastinal contours are within normal limits. There is marked severity calcification of the thoracic aorta. Multilevel degenerative changes seen throughout the thoracic spine. IMPRESSION: 1. Chronic appearing increased interstitial lung markings with mild, stable bibasilar linear scarring and/or atelectasis. Electronically Signed   By: Aram Candelahaddeus  Houston M.D.   On: 08/13/2019 18:02   ECHOCARDIOGRAM COMPLETE  Result Date: 08/14/2019   ECHOCARDIOGRAM REPORT   Patient Name:   Tracey PingCONSTANCE Porter Date of Exam: 08/14/2019 Medical Rec #:  [id number]         Height:       64.0 in Accession #:    [id number]        Weight:       140.0 lb Date of Birth:  01/18/1930          BSA:          1.68 m Patient Age:    89 years          BP:           122/75 mmHg Patient Gender: F                 HR:           97 bpm. Exam Location:  Inpatient Procedure: 2D Echo Indications:    Abnormal ECG 794.31/R94.31  History:        Patient has prior history of Echocardiogram examinations, most                 recent 05/12/2017. Aortic Valve Disease, Arrythmias:Atrial                 Fibrillation; Risk Factors:Diabetes and Hypertension.  Sonographer:    Ross LudwigArthur Guy RDCS (AE) Referring Phys: [phone] JARED M GARDNER  Sonographer Comments: Technically difficult study due to poor echo windows. Image acquisition challenging due to uncooperative patient. Attempted to continue test three times after patient said to stop. When asked if she wanted to stop the test, she said  go ahead. After last attempt patient requested that I stop the test due to probe pressure making it hard for her to breath. Test stopped at that point. IMPRESSIONS  1. Left ventricular ejection fraction, by visual estimation, is 65 to 70%. The left ventricle has normal function. Left ventricular  septal wall thickness was severely increased. Severely increased left ventricular posterior wall thickness. Inadequate for regional wall motion assessment as patient requested study to be terminated and no apical 2 or 3 chamber view obtained.  2. Left ventricular diastolic parameters are consistent with Grade I diastolic dysfunction (impaired relaxation).  3. Global right ventricle has normal systolic function.The right ventricular size is normal. No increase in right ventricular wall thickness.  4.  Left atrial size was mildly dilated.  5. Right atrial size was normal.  6. Moderate calcification of the mitral valve leaflet(s). Moderate thickening of the mitral valve leaflet(s). Severe mitral annular calcification. Mild mitral valve regurgitation. No evidence of mitral stenosis.  7. The tricuspid valve is normal in structure. Tricuspid valve regurgitation is not demonstrated.  8. The aortic valve is tricuspid. There is severe calcifcation of the aortic valve. There is severe thickening of the aortic valve. Aortic valve regurgitation is not visualized. No evidence of aortic valve sclerosis or stenosis.  9. The pulmonic valve was normal in structure. Pulmonic valve regurgitation is mild. 10. Cannot assess PASP due to IVC not visualized. FINDINGS  Left Ventricle: Left ventricular ejection fraction, by visual estimation, is 65 to 70%. The left ventricle has normal function. The left ventricle is not well visualized. Severely increased left ventricular posterior wall thickness. There is no left ventricular hypertrophy. The left ventricular diastology could not be evaluated due to mitral annular calcification (moderate or greater). Left ventricular diastolic parameters are consistent with Grade I diastolic dysfunction (impaired relaxation). Indeterminate filling pressures. Right Ventricle: The right ventricular size is normal. No increase in right ventricular wall thickness. Global RV systolic function is has normal  systolic function. Cannot assess PASP due to IVC not visualized. Left Atrium: Left atrial size was mildly dilated. Right Atrium: Right atrial size was normal in size Pericardium: There is no evidence of pericardial effusion. Mitral Valve: The mitral valve is normal in structure. There is moderate thickening of the mitral valve leaflet(s). There is moderate calcification of the mitral valve leaflet(s). Severe mitral annular calcification. Mild mitral valve regurgitation. No evidence of mitral valve stenosis by observation. Tricuspid Valve: The tricuspid valve is normal in structure. Tricuspid valve regurgitation is not demonstrated. Aortic Valve: The aortic valve is tricuspid. . There is severe thickening and severe calcifcation of the aortic valve. Aortic valve regurgitation is not visualized. The aortic valve is structurally normal, with no evidence of sclerosis or stenosis. Moderate to severe aortic valve annular calcification. There is severe thickening of the aortic valve. There is severe calcifcation of the aortic valve. Aortic valve mean gradient measures 7.6 mmHg. Aortic valve peak gradient measures 13.0 mmHg. Pulmonic Valve: The pulmonic valve was normal in structure. Pulmonic valve regurgitation is mild. Pulmonic regurgitation is mild. Aorta: The aortic root, ascending aorta and aortic arch are all structurally normal, with no evidence of dilitation or obstruction. Venous: The inferior vena cava was not well visualized. IAS/Shunts: No atrial level shunt detected by color flow Doppler. There is no evidence of a patent foramen ovale. No ventricular septal defect is seen or detected. There is no evidence of an atrial septal defect.  LEFT VENTRICLE PLAX 2D LVIDd:         2.46 cm LVIDs:         2.01 cm LV PW:         1.73 cm LV IVS:        2.20 cm LV SV:         9 ml LV SV Index:   5.02  LEFT ATRIUM           Index LA diam:      3.70 cm 2.20 cm/m LA Vol (A4C): 61.9 ml 36.82 ml/m  AORTIC VALVE AV Vmax:            180.20 cm/s AV Vmean:          130.000 cm/s AV VTI:  0.257 m AV Peak Grad:      13.0 mmHg AV Mean Grad:      7.6 mmHg LVOT Vmax:         103.80 cm/s LVOT Vmean:        73.600 cm/s LVOT VTI:          0.170 m LVOT/AV VTI ratio: 0.66  AORTA Ao Root diam: 3.00 cm Ao Asc diam:  3.70 cm MITRAL VALVE                         TRICUSPID VALVE MV Area (PHT): 3.74 cm              TR Peak grad:   23.4 mmHg MV PHT:        58.87 msec            TR Vmax:        246.00 cm/s MV Decel Time: 203 msec MV E velocity: 63.60 cm/s  103 cm/s  SHUNTS MV A velocity: 105.00 cm/s 70.3 cm/s Systemic VTI: 0.17 m MV E/A ratio:  0.61        1.5  Armanda Magic MD Electronically signed by Armanda Magic MD Signature Date/Time: 08/14/2019/10:54:52 AM    Final     Medications:   . acetaminophen  1,000 mg Oral BID  . acyclovir  800 mg Oral BID  . amiodarone  200 mg Oral Daily  . apixaban  5 mg Oral BID  . vitamin C  500 mg Oral BID  . bisacodyl  10 mg Rectal Once  . cholecalciferol  5,000 Units Oral Daily  . conjugated estrogens  1 Applicatorful Vaginal QODAY  . dexamethasone  4 mg Oral Daily   Followed by  . [START ON 08/21/2019] dexamethasone  2 mg Oral Daily   Followed by  . [START ON 08/25/2019] dexamethasone  1 mg Oral Daily  . diltiazem  120 mg Oral Daily  . ferrous sulfate  325 mg Oral Q breakfast  . insulin aspart  0-5 Units Subcutaneous QHS  . insulin aspart  0-9 Units Subcutaneous TID WC  . magnesium chloride  64 mg Oral Daily  . Melatonin  3 mg Oral QHS  . multivitamin with minerals  1 tablet Oral BID  . pantoprazole  40 mg Oral Daily  . polyethylene glycol  17 g Oral Daily  . polyvinyl alcohol  1 drop Both Eyes BID  . prednisoLONE acetate  1 drop Right Eye Daily  . timolol  1 drop Both Eyes BID  . vitamin B-12  1,000 mcg Oral Daily   Continuous Infusions: . sodium chloride 75 mL/hr at 08/13/19 2223     LOS: 0 days   Gwenyth Bender NP  [facility]   How to contact the [facility] Attending or  Consulting provider 7A - 7P or covering provider during after hours 7P -7A, for this patient?  1. Check the care team in [facility] and look for a) attending/consulting [facility] provider listed and b) the [facility] team listed 2. Log into [email or web address] and use [facility] universal password to access. If you do not have the password, please contact the hospital operator. 3. Locate the [facility] provider you are looking for under [facility] and page to a number that you can be directly reached. 4. If you still have difficulty reaching the provider, please page the [facility] (Director on Call) for the Hospitalists listed on amion for assistance.  08/14/2019, 12:44 PM

## 2019-08-14 NOTE — Evaluation (Signed)
Physical Therapy Evaluation Patient Details Name: Tracey Porter MRN: [id number] DOB: [birth year] Today's Date: 08/14/2019   History of Present Illness  86yo female c/o generalized weakness, reduced oral intake, and found to have UTI in ED. Also found to have orthostatic hypotension and acute encepthalopathy with further workup. PMH HTN, DM, R THA, dementia, A-fib on Eliquis, Covid 06/2019  Clinical Impression   Patient received in bed, initially pleasant but becoming increasingly agitated and angry at therapist during session today. With extended coaxing, agreeable to participation. Took supine BP and found it to be 84/63 (RN alerted), then attempted bed mobility however this was limited by patient's agitation and she adamantly refused/physically resisted. Allowed PT to check MMT in bed then demanded that therapist get out of room. She was left in bed with all needs met, bed alarm active. Difficult to make formal recommendation given patient's agitation and resistance to evaluation today but feel she could return to ALF if they can provide appropriate levels of assist (has been MinA in the past with RW per notes), SNF if not.     Follow Up Recommendations Other (comment)(back to ALF if they can provide appropriate levels of assist, SNF if not)    Equipment Recommendations  Other (comment)(TBD)    Recommendations for Other Services       Precautions / Restrictions Precautions Precautions: Fall;Other (comment) Precaution Comments: dementia, can be agitated and has known sundowning Restrictions Weight Bearing Restrictions: No      Mobility  Bed Mobility               General bed mobility comments: refused and became agitated with PT  Transfers                 General transfer comment: refused and became agitated with PT  Ambulation/Gait             General Gait Details: refused and became agitated with PT  Stairs            Wheelchair Mobility     Modified Rankin (Stroke Patients Only)       Balance                                             Pertinent Vitals/Pain Pain Assessment: Faces Pain Score: 0-No pain Faces Pain Scale: No hurt Pain Intervention(s): Limited activity within patient's tolerance;Monitored during session    Home Living Family/patient expects to be discharged to:: Assisted living               Home Equipment: Walker - 2 wheels;Shower seat - built in;Grab bars - toilet;Grab bars - tub/shower;Bedside commode Additional Comments: information taken from prior charting, patient resistant to answering questiosn and A&Ox1    Prior Function Level of Independence: Needs assistance   Gait / Transfers Assistance Needed: ambulates with RW limited community distances   ADL's / Homemaking Assistance Needed: staff assist/observation of bathing and dressing, meals prepared by facility   Comments: resident of ALF     Hand Dominance   Dominant Hand: Right    Extremity/Trunk Assessment   Upper Extremity Assessment Upper Extremity Assessment: Generalized weakness(R UE grossly 3+ to 4-/5, L UE 3/5)    Lower Extremity Assessment Lower Extremity Assessment: Generalized weakness(grossly 3/5 with limited testing performed)    Cervical / Trunk Assessment Cervical / Trunk Assessment: Kyphotic  Communication   Communication:  No difficulties  Cognition Arousal/Alertness: Awake/alert Behavior During Therapy: Flat affect;Agitated Overall Cognitive Status: History of cognitive impairments - at baseline                                 General Comments: hx of dementia at baseline. A&Ox1 today. At first pleasant and agreeable to mobility after some coaxing, then when PT attempted she sharply became agitated and refused to even sit at EOB      General Comments General comments (skin integrity, edema, etc.): refused OOB/did not allow PT to assess seated/standing balance     Exercises     Assessment/Plan    PT Assessment Patient needs continued PT services  PT Problem List Decreased strength;Decreased cognition;Decreased knowledge of use of DME;Decreased activity tolerance;Decreased safety awareness;Decreased balance;Decreased mobility;Decreased coordination       PT Treatment Interventions DME instruction;Balance training;Gait training;Neuromuscular re-education;Stair training;Functional mobility training;Patient/family education;Therapeutic activities;Therapeutic exercise    PT Goals (Current goals can be found in the Care Plan section)  Acute Rehab PT Goals Patient Stated Goal: for PT to get out of the room and leave me alone PT Goal Formulation: With patient Time For Goal Achievement: 08/28/19 Potential to Achieve Goals: Fair    Frequency Min 3X/week   Barriers to discharge        Co-evaluation               AM-PAC PT "6 Clicks" Mobility  Outcome Measure Help needed turning from your back to your side while in a flat bed without using bedrails?: A Little Help needed moving from lying on your back to sitting on the side of a flat bed without using bedrails?: A Little Help needed moving to and from a bed to a chair (including a wheelchair)?: A Lot Help needed standing up from a chair using your arms (e.g., wheelchair or bedside chair)?: A Lot Help needed to walk in hospital room?: A Little Help needed climbing 3-5 steps with a railing? : A Lot 6 Click Score: 15    End of Session   Activity Tolerance: Treatment limited secondary to agitation Patient left: in bed;with call bell/phone within reach;with bed alarm set Nurse Communication: Other (comment)(agitation and soft BP in supine) PT Visit Diagnosis: Muscle weakness (generalized) (M62.81);Unsteadiness on feet (R26.81);History of falling (Z91.81)    Time: 1093-2355 PT Time Calculation (min) (ACUTE ONLY): 18 min   Charges:   PT Evaluation $PT Eval Moderate Complexity: 1 Mod          Windell Norfolk, DPT, PN1   Supplemental Physical Therapist [place]    Pager [phone] Acute Rehab Office [phone]

## 2019-08-15 DIAGNOSIS — R7989 Other specified abnormal findings of blood chemistry: Secondary | ICD-10-CM

## 2019-08-15 DIAGNOSIS — R339 Retention of urine, unspecified: Secondary | ICD-10-CM

## 2019-08-15 DIAGNOSIS — G9341 Metabolic encephalopathy: Principal | ICD-10-CM

## 2019-08-15 DIAGNOSIS — L8990 Pressure ulcer of unspecified site, unspecified stage: Secondary | ICD-10-CM

## 2019-08-15 LAB — CK: Total CK: 27 U/L — ABNORMAL LOW (ref 38–234)

## 2019-08-15 LAB — GLUCOSE, CAPILLARY
Glucose-Capillary: 151 mg/dL — ABNORMAL HIGH (ref 70–99)
Glucose-Capillary: 132 mg/dL — ABNORMAL HIGH (ref 70–99)
Glucose-Capillary: 264 mg/dL — ABNORMAL HIGH (ref 70–99)
Glucose-Capillary: 175 mg/dL — ABNORMAL HIGH (ref 70–99)

## 2019-08-15 LAB — VITAMIN B12: Vitamin B-12: 3480 pg/mL — ABNORMAL HIGH (ref 180–914)

## 2019-08-15 LAB — T4, FREE: Free T4: 1.24 ng/dL — ABNORMAL HIGH (ref 0.61–1.12)

## 2019-08-15 LAB — BRAIN NATRIURETIC PEPTIDE: B Natriuretic Peptide: 201 pg/mL — ABNORMAL HIGH (ref 0.0–100.0)

## 2019-08-15 LAB — TSH: TSH: 0.089 u[IU]/mL — ABNORMAL LOW (ref 0.350–4.500)

## 2019-08-15 LAB — AMMONIA: Ammonia: 60 umol/L — ABNORMAL HIGH (ref 9–35)

## 2019-08-15 MED ORDER — CHLORHEXIDINE GLUCONATE CLOTH 2 % EX PADS
6.0000 | MEDICATED_PAD | Freq: Every day | CUTANEOUS | Status: DC
  Administered 2019-08-16 – 2019-08-21 (×6): 6 via TOPICAL

## 2019-08-15 MED ORDER — LACTULOSE 10 GM/15ML PO SOLN
20.0000 g | Freq: Two times a day (BID) | ORAL | Status: DC
  Administered 2019-08-15 – 2019-08-21 (×12): 20 g via ORAL
  Filled 2019-08-15 (×12): qty 30

## 2019-08-15 NOTE — Progress Notes (Signed)
Pt has difficulty voiding on her own x 24 hrs blader scan done  694 noted on scan . When I/o cath 600 ml noted

## 2019-08-15 NOTE — TOC Initial Note (Signed)
Transition of Care [facility]) - Initial/Assessment Note    Patient Details  Name: Tracey Porter MRN: [id number] Date of Birth: May 15, 1930  Transition of Care [facility]) CM/SW Contact:    Baldemar Lenis, LCSW Phone Number: 08/15/2019, 10:20 AM  Clinical Narrative:    CSW contacted by [facility] that patient has been active with home health PT, OT, and SLP and they are anticipating patient to return. CSW alerted MD that patient will need resumption orders for home health upon discharge. CSW contacted patient's daughter, Victorino Dike, to discuss discharge plan, and daughter is expecting patient to return to [facility] at this time. Daughter expressed some concern about the patient having some decline recently and she is not completely satisfied with how [facility] has been handling it, but she has no other plans for the patient's care at this time. Daughter asked about how patient was doing, and CSW informed her that she can come and visit. Daughter will come to the hospital this afternoon to visit patient and see how she's doing. Daughter appreciative of update. CSW to follow.         Expected Discharge Plan: Assisted Living Barriers to Discharge: Continued Medical Work up   Patient Goals and CMS Choice Patient states their goals for this hospitalization and ongoing recovery are:: patient unable to participate in goal setting at this time due to altered mental status CMS Medicare.gov Compare Post Acute Care list provided to:: Patient Represenative (must comment) Choice offered to / list presented to : Adult Children  Expected Discharge Plan and Services Expected Discharge Plan: Assisted Living     Post Acute Care Choice: Home Health Living arrangements for the past 2 months: Assisted Living Facility                           HH Arranged: PT, OT, Speech Therapy HH Agency: [facility] Date [facility] Agency Contacted: 08/15/19 Time HH Agency Contacted:  [id number] Representative spoke with at [facility] Agency: Rene Kocher  Prior Living Arrangements/Services Living arrangements for the past 2 months: Assisted Living Facility Lives with:: Facility Resident Patient language and need for interpreter reviewed:: No Do you feel safe going back to the place where you live?: Yes      Need for Family Participation in Patient Care: Yes (Comment) Care giver support system in place?: Yes (comment) Current home services: Home OT, Home PT Criminal Activity/Legal Involvement Pertinent to Current Situation/Hospitalization: No - Comment as needed  Activities of Daily Living      Permission Sought/Granted Permission sought to share information with : Facility Medical sales representative, Family Supports Permission granted to share information with : Yes, Verbal Permission Granted  Share Information with NAME: Victorino Dike  Permission granted to share info w AGENCY: [facility]  Permission granted to share info w Relationship: Daughter     Emotional Assessment   Attitude/Demeanor/Rapport: Unable to Assess Affect (typically observed): Unable to Assess Orientation: : Oriented to Self, Oriented to Place Alcohol / Substance Use: Not Applicable Psych Involvement: No (comment)  Admission diagnosis:  Orthostatic hypotension [I95.1] Failure to thrive in adult [R62.7] Generalized weakness [R53.1] Sundowning [F05] Acute encephalopathy [G93.40] Patient Active Problem List   Diagnosis Date Noted  . Pressure injury of skin 08/14/2019  . Acute encephalopathy 08/14/2019  . Abnormal EKG 08/14/2019  . Urinary retention 08/14/2019  . Generalized weakness 08/13/2019  . Acute respiratory disease due to COVID-19 virus 06/23/2019  . Acute respiratory failure with hypoxia (HCC)  06/23/2019  . Displaced fracture of right femoral neck ([place]) 04/30/2018  . Chronic back pain 10/10/2017  . Dyslipidemia 10/10/2017  . PAF (paroxysmal atrial fibrillation) ([place]) 07/28/2017  . Orthostatic  hypotension 07/28/2017  . History of GI bleed 07/28/2017  . Chronic diastolic heart failure ([place])   . Multiple fractures of pelvis with unstable disruption of pelvic ring, initial encounter for closed fracture ([place]) 03/20/2017  . Atrial fibrillation with RVR ([place]) 03/20/2017  . Aortic atherosclerosis ([place]) 03/20/2017  . Hypotension due to blood loss   . Chronic anticoagulation   . Closed pelvic fracture ([place]) 03/19/2017  . Leukocytosis 03/19/2017  . Fall at home, initial encounter 03/19/2017  . Paroxysmal SVT (supraventricular tachycardia) ([place])   . Essential hypertension   . Non-insulin treated type 2 diabetes mellitus ([place]) 03/20/2015  . Hypokalemia 03/19/2015   PCP:  Reymundo Poll, MD Pharmacy:   [facility], [place] [address] Phone: [phone] Fax: [phone]     Social Determinants of Health (SDOH) Interventions    Readmission Risk Interventions No flowsheet data found.

## 2019-08-15 NOTE — Discharge Instructions (Signed)

## 2019-08-15 NOTE — Progress Notes (Signed)
PROGRESS NOTE  Tracey Porter BHA:[id number] DOB: 1930/02/22   PCP: Florentina Jenny, MD  Patient is from: ALF  DOA: 08/13/2019 LOS: 1  Brief Narrative / Interim history: 86 year old female with history of dementia, diet-controlled DM-2, HTN, paroxysmal A. fib on Eliquis and hospitalization for COVID-19 pneumonia 2 months ago presenting with generalized weakness and decreased oral intake in the setting of urinary tract infection.  She also had orthostatic hypotension and acute encephalopathy.   Subjective: No major events overnight or this morning.  Continues to feel tired.  Reports shortness of breath.  Denies chest pain, abdominal pain or other GI or UTI symptoms.  Objective: Vitals:   08/15/19 0500 08/15/19 0826 08/15/19 0900 08/15/19 1246  BP:  127/74  131/72  Pulse:  85  81  Resp:  20  20  Temp:  98.3 F (36.8 C)  98.3 F (36.8 C)  TempSrc:  Oral  Oral  SpO2:  96% 96% 96%  Weight: 68.2 kg     Height:        Intake/Output Summary (Last 24 hours) at 08/15/2019 1410 Last data filed at 08/15/2019 0900 Gross per 24 hour  Intake 2252.5 ml  Output 600 ml  Net 1652.5 ml   Filed Weights   08/13/19 1700 08/15/19 0500  Weight: 63.5 kg 68.2 kg    Examination:  GENERAL: No apparent distress appears tired. HEENT: MMM.  Vision and hearing grossly intact.  NECK: Supple.  No apparent JVD.  RESP:  No IWOB. Good air movement bilaterally. CVS:  RRR. Heart sounds normal.  ABD/GI/GU: Bowel sounds present. Soft. Non tender.  MSK/EXT:  Moves extremities. No apparent deformity. No edema.  SKIN: no apparent skin lesion or wound NEURO: Awake and oriented self and place.  No apparent focal neuro deficit. PSYCH: Calm.  Flat affect.  Procedures:  None  Assessment & Plan: Generalized weakness: unclear etiology but multiple potential etiologies including UTI, chronic steroid, orthostasis, psychogenic, delirium and generalized deconditioning from recent COVID-19 infection.  Echo not  revealing.  B12 high.  TSH low.  CT head without acute finding.  No apparent focal neuro deficit. -Continue tapering Decadron. -Check free T4, CK -PT/OT/OOB  Acute metabolic encephalopathy in patient with dementia: Improving.  She is oriented x2 today. CT head without acute finding.  B12 high.  Mild leukocytosis but no other signs of infection.  No apparent focal neuro deficit.  -Treat treatable causes -Check free T4 and ammonia  Orthostatic hypotension: Remains orthostatic with SBP dropping from 119-98 just from lying to sitting.  -Amlodipine discontinued. -Hold Cardizem CD-she has already received today's dose. -Apply TED hose -Continue gentle IV fluid -Recheck orthostatic vitals in the morning  Paroxysmal A. Fib: Rate controlled. -Holding Cardizem as above.  Continue amiodarone and Eliquis.  Chronic diastolic CHF: Echo with EF of 65 to 70% and G1 DD.  Appears euvolemic. -Continue gentle IV fluid as above -Check BNP. -Monitor fluid status  Acute urinary retention: Reportedly had 1200 cc on I&O cath on presentation. -Continue bladder scan every 8 hours-most recent bladder scan 300 cc. -Leave Foley catheter in if no spontaneous void in the next 2 hours.  Low TSH -Check free T4  Pressure injury of skin Pressure Injury 08/14/19 Left Stage 1 -  Intact skin with non-blanchable redness of a localized area usually over a bony prominence. old red firm (Active)  08/14/19 0215  Location:   Location Orientation: Left  Staging: Stage 1 -  Intact skin with non-blanchable redness of a localized area usually  over a bony prominence.  Wound Description (Comments): old red firm  Present on Admission: Yes               DVT prophylaxis: On Eliquis for atrial fibrillation Code Status: Full code Family Communication: Patient and/or RN.  Discharge barrier: Generalized weakness and orthostatic hypotension requiring IV fluid and further evaluation Patient is from: ALF Final disposition:  Likely ALF with HH  Consultants: None   Microbiology summarized: None  Sch Meds:  Scheduled Meds: . acetaminophen  1,000 mg Oral BID  . acyclovir  800 mg Oral BID  . amiodarone  200 mg Oral Daily  . apixaban  5 mg Oral BID  . vitamin C  500 mg Oral BID  . cholecalciferol  5,000 Units Oral Daily  . conjugated estrogens  1 Applicatorful Vaginal QODAY  . dexamethasone  4 mg Oral Daily   Followed by  . [START ON 08/21/2019] dexamethasone  2 mg Oral Daily   Followed by  . [START ON 08/25/2019] dexamethasone  1 mg Oral Daily  . diltiazem  120 mg Oral Daily  . ferrous sulfate  325 mg Oral Q breakfast  . insulin aspart  0-5 Units Subcutaneous QHS  . insulin aspart  0-9 Units Subcutaneous TID WC  . magnesium chloride  64 mg Oral Daily  . Melatonin  3 mg Oral QHS  . multivitamin with minerals  1 tablet Oral BID  . pantoprazole  40 mg Oral Daily  . polyethylene glycol  17 g Oral Daily  . polyvinyl alcohol  1 drop Both Eyes BID  . prednisoLONE acetate  1 drop Right Eye Daily  . timolol  1 drop Both Eyes BID  . vitamin B-12  1,000 mcg Oral Daily   Continuous Infusions: . sodium chloride 75 mL/hr at 08/15/19 0734   PRN Meds:.acetaminophen **OR** acetaminophen, lidocaine, ondansetron **OR** ondansetron (ZOFRAN) IV, senna-docusate, traZODone  Antimicrobials: Anti-infectives (From admission, onward)   Start     Dose/Rate Route Frequency Ordered Stop   08/13/19 2359  acyclovir (ZOVIRAX) tablet 800 mg     800 mg Oral 2 times daily 08/13/19 2208     08/13/19 2215  nitrofurantoin (macrocrystal-monohydrate) (MACROBID) capsule 100 mg  Status:  Discontinued     100 mg Oral Every 12 hours 08/13/19 2214 08/14/19 1114       I have personally reviewed the following labs and images: CBC: Recent Labs  Lab 08/13/19 1652 08/14/19 0400  WBC 11.2* 12.3*  HGB 13.0 13.6  HCT 39.2 40.6  MCV 100.5* 98.1  PLT 217 196   BMP &GFR Recent Labs  Lab 08/13/19 1652 08/14/19 0400  NA 139 139  K  4.3 3.5  CL 107 104  CO2 21* 24  GLUCOSE 253* 154*  BUN 25* 17  CREATININE 0.72 0.35*  CALCIUM 8.4* 8.6*   Estimated Creatinine Clearance: 47 mL/min (A) (by C-G formula based on SCr of 0.35 mg/dL (L)). Liver & Pancreas: No results for input(s): AST, ALT, ALKPHOS, BILITOT, PROT, ALBUMIN in the last 168 hours. No results for input(s): LIPASE, AMYLASE in the last 168 hours. No results for input(s): AMMONIA in the last 168 hours. Diabetic: No results for input(s): HGBA1C in the last 72 hours. Recent Labs  Lab 08/14/19 0607 08/14/19 1622 08/14/19 2229 08/15/19 0610 08/15/19 1245  GLUCAP 136* 210* 207* 151* 132*   Cardiac Enzymes: No results for input(s): CKTOTAL, CKMB, CKMBINDEX, TROPONINI in the last 168 hours. No results for input(s): PROBNP in the last 8760  hours. Coagulation Profile: No results for input(s): INR, PROTIME in the last 168 hours. Thyroid Function Tests: Recent Labs    08/15/19 0317  TSH 0.089*   Lipid Profile: No results for input(s): CHOL, HDL, LDLCALC, TRIG, CHOLHDL, LDLDIRECT in the last 72 hours. Anemia Panel: Recent Labs    08/15/19 0317  VITAMINB12 3,480*   Urine analysis:    Component Value Date/Time   COLORURINE AMBER (A) 08/13/2019 2030   APPEARANCEUR CLEAR 08/13/2019 2030   LABSPEC 1.020 08/13/2019 2030   PHURINE 5.0 08/13/2019 2030   GLUCOSEU >=500 (A) 08/13/2019 2030   [place] NEGATIVE 08/13/2019 2030   [place] NEGATIVE 08/13/2019 2030   KETONESUR 5 (A) 08/13/2019 2030   PROTEINUR NEGATIVE 08/13/2019 2030   UROBILINOGEN 0.2 03/19/2015 1945   NITRITE NEGATIVE 08/13/2019 2030   LEUKOCYTESUR NEGATIVE 08/13/2019 2030   Sepsis Labs: Invalid input(s): PROCALCITONIN, [place]  Microbiology: No results found for this or any previous visit (from the past 240 hour(s)).  Radiology Studies: No results found.    Ireene Ballowe T. Volo  If 7PM-7AM, please contact night-coverage [email or web address] Password [email or web address] 08/15/2019,  2:10 PM

## 2019-08-15 NOTE — Progress Notes (Signed)
Physical Therapy Treatment Patient Details Name: Tracey Porter MRN: [id number] DOB: 1930/06/28 Today's Date: 08/15/2019    History of Present Illness 86yo female c/o generalized weakness, reduced oral intake, and found to have UTI in ED. Also found to have orthostatic hypotension and acute encepthalopathy with further workup. PMH HTN, DM, R THA, dementia, A-fib on Eliquis, Covid 06/2019    PT Comments    Pt performed transfers to obtain orthostatic vitals.  She is symptomatic and too dizzy to progress mobility.  Plan next session for repeated orthostatics with TED HOSE donned.  Pt redirectable today.  BP supine-122/66 BP sitting-96/64 BP standing but started reading in standng and returned to seated position due to dizziness-79/68 BP post standing in supine-123/78 BP once in chair position in bed-124/70    Follow Up Recommendations  Other (comment)(back to ALF if they can provide adequate assistance.  If not wsill require SNF placement)     Equipment Recommendations  Other (comment)(TBD)    Recommendations for Other Services       Precautions / Restrictions Precautions Precautions: Fall;Other (comment) Precaution Comments: dementia, can be agitated and has known sundowning. orthostatic Restrictions Weight Bearing Restrictions: No    Mobility  Bed Mobility Overal bed mobility: Needs Assistance Bed Mobility: Supine to Sit;Sit to Supine     Supine to sit: Mod assist Sit to supine: Mod assist   General bed mobility comments: for trunk elevation to move to edge of bed and for lift B LEs back into bed.  Pt assisted in scooting to [facility].  Transfers Overall transfer level: Needs assistance Equipment used: Rolling walker (2 wheeled) Transfers: Sit to/from Stand Sit to Stand: Mod assist         General transfer comment: Cues for hand placement to and from seated surface.  Pt in kyphotic posture in standing and unable to remain standing to obtain BP.  BP started in  standing and ended in sitting.  Reports feeling dizzy and nauseated.  Ambulation/Gait Ambulation/Gait assistance: (NT unable due to symptomatic orthostasis)               Stairs             Wheelchair Mobility    Modified Rankin (Stroke Patients Only)       Balance Overall balance assessment: Needs assistance Sitting-balance support: Bilateral upper extremity supported;Feet supported Sitting balance-Leahy Scale: Fair Sitting balance - Comments: sits with BUE support, trunk flexed position. endorses dizziness on initial EOB                                    Cognition Arousal/Alertness: Awake/alert Behavior During Therapy: Flat affect;Anxious Overall Cognitive Status: History of cognitive impairments - at baseline                                 General Comments: hx of dementia at baseline. She was easily directable today.      Exercises      General Comments        Pertinent Vitals/Pain Pain Assessment: 0-10 Pain Score: 9  Pain Location: vagina Pain Descriptors / Indicators: Burning Pain Intervention(s): Monitored during session;Repositioned    Home Living                      Prior Function  PT Goals (current goals can now be found in the care plan section) Acute Rehab PT Goals Patient Stated Goal: "I want to lay back down" Potential to Achieve Goals: Fair Progress towards PT goals: Progressing toward goals    Frequency    Min 3X/week      PT Plan Current plan remains appropriate    Co-evaluation              AM-PAC PT "6 Clicks" Mobility   Outcome Measure  Help needed turning from your back to your side while in a flat bed without using bedrails?: A Little Help needed moving from lying on your back to sitting on the side of a flat bed without using bedrails?: A Little Help needed moving to and from a bed to a chair (including a wheelchair)?: A Lot Help needed standing up from  a chair using your arms (e.g., wheelchair or bedside chair)?: A Lot Help needed to walk in hospital room?: Total Help needed climbing 3-5 steps with a railing? : Total 6 Click Score: 12    End of Session Equipment Utilized During Treatment: Gait belt Activity Tolerance: Treatment limited secondary to medical complications (Comment) Patient left: in bed;with call bell/phone within reach;with bed alarm set(placed in chair position.) Nurse Communication: Mobility status(orthostatic vitals) PT Visit Diagnosis: Muscle weakness (generalized) (M62.81);Unsteadiness on feet (R26.81);History of falling (Z91.81)     Time: 5726-2035 PT Time Calculation (min) (ACUTE ONLY): 24 min  Charges:  $Therapeutic Activity: 23-37 mins                     Bonney Leitz , PTA Acute Rehabilitation Services Pager [phone] Office [phone]     Tracey Porter Artis Delay 08/15/2019, 5:13 PM

## 2019-08-15 NOTE — Evaluation (Signed)
Occupational Therapy Evaluation Patient Details Name: Tracey Porter MRN: [id number] DOB: [birth year] Today's Date: 08/15/2019    History of Present Illness 86yo female c/o generalized weakness, reduced oral intake, and found to have UTI in ED. Also found to have orthostatic hypotension and acute encepthalopathy with further workup. PMH HTN, DM, R THA, dementia, A-fib on Eliquis, Covid 06/2019   Clinical Impression   Pt admitted with the above diagnoses and presents with below problem list. Pt will benefit from continued acute OT to address the below listed deficits and maximize independence with basic ADLs prior to d/c to venue below. Per chart review, PTA pt was mod I with functional mobility/transfers, supervision for bathing/dressing. Pt resides at ALF. Pt presents with symptomatic orthostatic hypotension and generalized weakness, fatigue impacting level of assist with ADLs. Currently assist level likely fluctuates depending on time of day and pt's volition in unfamiliar, hospital setting. Based on clinical judgement pt overall needing more assist than usual. Likely mod A with UB/LB ADLs currently. Of note, pt with symptomatic orthostatic hypotension this session with bp 119/68 in supine, 98/57 sitting EOB. Will continue to follow and update d/c recommendations and goals if needed during her admission.      Follow Up Recommendations  Other (comment)(ALF if possible given current level of assist, otherwise SNF)    Equipment Recommendations  None recommended by OT    Recommendations for Other Services       Precautions / Restrictions Precautions Precautions: Fall;Other (comment) Precaution Comments: dementia, can be agitated and has known sundowning. orthostatic Restrictions Weight Bearing Restrictions: No      Mobility Bed Mobility Overal bed mobility: Needs Assistance Bed Mobility: Supine to Sit;Sit to Supine     Supine to sit: Mod assist Sit to supine: Min assist   General  bed mobility comments: mod A today more due to cognition (decreased initiation) than physical demand.   Transfers                 General transfer comment: refused. noted to be symptomatic orthostatic upon sitting so did not push the idea of standing too much.    Balance Overall balance assessment: Needs assistance Sitting-balance support: Bilateral upper extremity supported;Feet supported Sitting balance-Leahy Scale: Fair Sitting balance - Comments: sits with BUE support, trunk flexed position. endorses dizziness on initial EOB                                   ADL either performed or assessed with clinical judgement   ADL Overall ADL's : Needs assistance/impaired Eating/Feeding: Set up;Sitting   Grooming: Sitting;Moderate assistance   Upper Body Bathing: Sitting;Moderate assistance   Lower Body Bathing: Maximal assistance;Sitting/lateral leans;Sit to/from stand;Moderate assistance   Upper Body Dressing : Sitting;Moderate assistance   Lower Body Dressing: Moderate assistance;Sitting/lateral leans                 General ADL Comments: Assistance level for cognitive>physical demands of task. Decreased intiation and resistant at times.      Vision         Perception     Praxis      Pertinent Vitals/Pain Pain Assessment: Faces Faces Pain Scale: No hurt     Hand Dominance Right   Extremity/Trunk Assessment Upper Extremity Assessment Upper Extremity Assessment: Generalized weakness;Difficult to assess due to impaired cognition   Lower Extremity Assessment Lower Extremity Assessment: Defer to PT evaluation   Cervical / Trunk  Assessment Cervical / Trunk Assessment: Kyphotic   Communication Communication Communication: Other (comment);No difficulties(minimal verbalizations)   Cognition Arousal/Alertness: Awake/alert Behavior During Therapy: Flat affect;Anxious Overall Cognitive Status: History of cognitive impairments - at baseline                                      General Comments  Pt reporting feeling dizzy upon sitting up, orthostatic. Sat long enough to get bp reading then returned to supine. Therapist elevated HOB position to about 50* to promote improved tolerance of upright position.     Exercises     Shoulder Instructions      Home Living Family/patient expects to be discharged to:: Assisted living                             Home Equipment: Dan Humphreys - 2 wheels;Shower seat - built in;Grab bars - toilet;Grab bars - tub/shower;Bedside commode   Additional Comments: per chart review      Prior Functioning/Environment Level of Independence: Needs assistance  Gait / Transfers Assistance Needed: ambulates with RW limited community distances  ADL's / Homemaking Assistance Needed: staff assist/observation of bathing and dressing, meals prepared by facility    Comments: resident of ALF        OT Problem List: Decreased strength;Decreased activity tolerance;Impaired balance (sitting and/or standing);Decreased safety awareness;Decreased knowledge of use of DME or AE      OT Treatment/Interventions: Self-care/ADL training;Therapeutic exercise;Energy conservation;DME and/or AE instruction;Therapeutic activities;Patient/family education;Balance training    OT Goals(Current goals can be found in the care plan section) Acute Rehab OT Goals Patient Stated Goal: "I want to lay back down" OT Goal Formulation: Patient unable to participate in goal setting Time For Goal Achievement: 08/29/19 Potential to Achieve Goals: Fair ADL Goals Pt Will Perform Grooming: with min assist;sitting Pt Will Perform Upper Body Bathing: with min assist;sitting Pt Will Perform Lower Body Bathing: with min assist;sit to/from stand Pt Will Transfer to Toilet: with min assist;ambulating Pt Will Perform Toileting - Clothing Manipulation and hygiene: with min assist;sit to/from stand  OT Frequency: Min  2X/week   Barriers to D/C:            Co-evaluation              AM-PAC OT "6 Clicks" Daily Activity     Outcome Measure Help from another person eating meals?: A Little Help from another person taking care of personal grooming?: A Lot Help from another person toileting, which includes using toliet, bedpan, or urinal?: A Lot Help from another person bathing (including washing, rinsing, drying)?: A Lot Help from another person to put on and taking off regular upper body clothing?: A Lot Help from another person to put on and taking off regular lower body clothing?: A Lot 6 Click Score: 13   End of Session Nurse Communication: Other (comment)(orthostatic in sitting)  Activity Tolerance: Other (comment);Patient limited by fatigue(orthostatic, dizzy upon sitting up) Patient left: in bed;with call bell/phone within reach;with bed alarm set;Other (comment)(pt noted to have telesitter)  OT Visit Diagnosis: Unsteadiness on feet (R26.81);Muscle weakness (generalized) (M62.81);Other symptoms and signs involving cognitive function                Time: 3716-9678 OT Time Calculation (min): 15 min Charges:  OT General Charges $OT Visit: 1 Visit OT Evaluation $OT Eval Low Complexity: 1 Low  Raynald Kemp, OT Acute Rehabilitation Services Pager: [phone] Office: [phone]   Pilar Grammes 08/15/2019, 11:15 AM

## 2019-08-16 LAB — CBC
HCT: 36.4 % (ref 36.0–46.0)
Hemoglobin: 12.2 g/dL (ref 12.0–15.0)
MCH: 32.4 pg (ref 26.0–34.0)
MCHC: 33.5 g/dL (ref 30.0–36.0)
MCV: 96.8 fL (ref 80.0–100.0)
Platelets: 188 10*3/uL (ref 150–400)
RBC: 3.76 MIL/uL — ABNORMAL LOW (ref 3.87–5.11)
RDW: 15.3 % (ref 11.5–15.5)
WBC: 10.9 10*3/uL — ABNORMAL HIGH (ref 4.0–10.5)
nRBC: 0 % (ref 0.0–0.2)

## 2019-08-16 LAB — RENAL FUNCTION PANEL
Albumin: 2.6 g/dL — ABNORMAL LOW (ref 3.5–5.0)
Anion gap: 13 (ref 5–15)
BUN: 14 mg/dL (ref 8–23)
CO2: 26 mmol/L (ref 22–32)
Calcium: 8.8 mg/dL — ABNORMAL LOW (ref 8.9–10.3)
Chloride: 99 mmol/L (ref 98–111)
Creatinine, Ser: 0.39 mg/dL — ABNORMAL LOW (ref 0.44–1.00)
GFR calc Af Amer: >60 mL/min (ref >60)
GFR calc non Af Amer: >60 mL/min (ref >60)
Glucose, Bld: 134 mg/dL — ABNORMAL HIGH (ref 70–99)
Phosphorus: 2 mg/dL — ABNORMAL LOW (ref 2.5–4.6)
Potassium: 3.6 mmol/L (ref 3.5–5.1)
Sodium: 138 mmol/L (ref 135–145)

## 2019-08-16 LAB — GLUCOSE, CAPILLARY
Glucose-Capillary: 129 mg/dL — ABNORMAL HIGH (ref 70–99)
Glucose-Capillary: 132 mg/dL — ABNORMAL HIGH (ref 70–99)
Glucose-Capillary: 172 mg/dL — ABNORMAL HIGH (ref 70–99)
Glucose-Capillary: 161 mg/dL — ABNORMAL HIGH (ref 70–99)

## 2019-08-16 LAB — MAGNESIUM: Magnesium: 1.8 mg/dL (ref 1.7–2.4)

## 2019-08-16 NOTE — Progress Notes (Signed)
PROGRESS NOTE  Tracey Porter EQA:[id number] DOB: 1930-06-17   PCP: Florentina Jenny, MD  Patient is from: ALF  DOA: 08/13/2019 LOS: 2  Brief Narrative / Interim history: 86 year old female with history of dementia, diet-controlled DM-2, HTN, paroxysmal A. fib on Eliquis and hospitalization for COVID-19 pneumonia 2 months ago presenting with generalized weakness and decreased oral intake in the setting of urinary tract infection.  She also has significant symptomatic orthostatic hypotension,  acute encephalopathy and acute urinary retention  Subjective: No major events overnight or this morning.  Continues to endorse generalized weakness.  She denies chest pain, dyspnea, palpitation or GI symptoms.  Objective: Vitals:   08/15/19 2344 08/16/19 0414 08/16/19 0439 08/16/19 0746  BP: 112/72 134/80  131/81  Pulse: 88 89  90  Resp: 16 18  18   Temp: 97.8 F (36.6 C) (!) 97.5 F (36.4 C)  97.7 F (36.5 C)  TempSrc: Oral Oral  Oral  SpO2: 97% 99%  93%  Weight:   71.3 kg   Height:        Intake/Output Summary (Last 24 hours) at 08/16/2019 1116 Last data filed at 08/16/2019 0530 Gross per 24 hour  Intake 1526.68 ml  Output 1400 ml  Net 126.68 ml   Filed Weights   08/13/19 1700 08/15/19 0500 08/16/19 0439  Weight: 63.5 kg 68.2 kg 71.3 kg    Examination:  GENERAL: No apparent distress.  Nontoxic. HEENT: MMM.  Vision and hearing grossly intact.  NECK: Supple.  No apparent JVD.  RESP:  No IWOB. Good air movement bilaterally. CVS:  RRR. Heart sounds normal.  ABD/GI/GU: Bowel sounds present. Soft. Non tender.  Indwelling Foley catheter in place. MSK/EXT:  Moves extremities. No apparent deformity. No edema.  SKIN: no apparent skin lesion or wound NEURO: Awake, alert and oriented self and partial place only.  Cranial and motor grossly intact. PSYCH: Calm. Normal affect.   Procedures:  None  Assessment & Plan: Generalized weakness: unclear etiology but multiple potential etiologies  including UTI, chronic steroid, orthostasis, psychogenic, delirium and generalized deconditioning from recent COVID-19 infection.  Work-up including echo, B12, CT head, thyroid panel and CK not revealing. No apparent focal neuro deficit.  Ammonia level slightly elevated. -Continue tapering Decadron. -PT/OT/OOB  Acute metabolic encephalopathy in patient with dementia: Improving.  She is oriented x2 today (self and partial place).  No apparent focal neuro deficit. Work-up as above.  She has elevated ammonia to 68 and mild leukocytosis. -Continue lactulose -Frequent orientation delirium precautions.  Orthostatic hypotension: Continues to have significant orthostasis -Amlodipine and Cardizem CD discontinued. -Apply TED hose -Continue gentle IV fluid -Recheck orthostatic vitals -Continue PT/OT.  Paroxysmal A. Fib: Rate controlled. -Holding Cardizem as above.   -Continue amiodarone and Eliquis.  Chronic diastolic CHF: Echo with EF of 65 to 70% and G1 DD.  Appears euvolemic.  BNP 201, lower than previous value in 2018.  Had good urine output.  -Continue gentle IV fluid as above -Monitor fluid status  Acute urinary retention: Reportedly had 1200 cc on I&O cath on presentation. -Continue indwelling Foley catheter.  Low TSH/mild elevated free T4: Could be related to amiodarone. -Recheck outpatient  Pressure injury of skin Pressure Injury 08/14/19 Left Stage 1 -  Intact skin with non-blanchable redness of a localized area usually over a bony prominence. old red firm (Active)  08/14/19 0215  Location:   Location Orientation: Left  Staging: Stage 1 -  Intact skin with non-blanchable redness of a localized area usually over a bony prominence.  Wound Description (Comments): old red firm  Present on Admission: Yes               DVT prophylaxis: On Eliquis for atrial fibrillation Code Status: Full code Family Communication: Patient and RN.  Discharge barrier: Orthostatic hypotension  and generalized weakness Patient is from: ALF Final disposition: Likely ALF with [place]  Consultants: None   Microbiology summarized: None  Sch Meds:  Scheduled Meds: . acetaminophen  1,000 mg Oral BID  . acyclovir  800 mg Oral BID  . amiodarone  200 mg Oral Daily  . apixaban  5 mg Oral BID  . vitamin C  500 mg Oral BID  . Chlorhexidine Gluconate Cloth  6 each Topical Daily  . cholecalciferol  5,000 Units Oral Daily  . conjugated estrogens  1 Applicatorful Vaginal QODAY  . dexamethasone  4 mg Oral Daily   Followed by  . [START ON 08/21/2019] dexamethasone  2 mg Oral Daily   Followed by  . [START ON 08/25/2019] dexamethasone  1 mg Oral Daily  . ferrous sulfate  325 mg Oral Q breakfast  . insulin aspart  0-5 Units Subcutaneous QHS  . insulin aspart  0-9 Units Subcutaneous TID WC  . lactulose  20 g Oral BID  . magnesium chloride  64 mg Oral Daily  . Melatonin  3 mg Oral QHS  . multivitamin with minerals  1 tablet Oral BID  . pantoprazole  40 mg Oral Daily  . polyethylene glycol  17 g Oral Daily  . polyvinyl alcohol  1 drop Both Eyes BID  . prednisoLONE acetate  1 drop Right Eye Daily  . timolol  1 drop Both Eyes BID   Continuous Infusions:  PRN Meds:.acetaminophen **OR** acetaminophen, lidocaine, ondansetron **OR** ondansetron (ZOFRAN) IV, senna-docusate, traZODone  Antimicrobials: Anti-infectives (From admission, onward)   Start     Dose/Rate Route Frequency Ordered Stop   08/13/19 2359  acyclovir (ZOVIRAX) tablet 800 mg     800 mg Oral 2 times daily 08/13/19 2208     08/13/19 2215  nitrofurantoin (macrocrystal-monohydrate) (MACROBID) capsule 100 mg  Status:  Discontinued     100 mg Oral Every 12 hours 08/13/19 2214 08/14/19 1114       I have personally reviewed the following labs and images: CBC: Recent Labs  Lab 08/13/19 1652 08/14/19 0400 08/16/19 0518  WBC 11.2* 12.3* 10.9*  HGB 13.0 13.6 12.2  HCT 39.2 40.6 36.4  MCV 100.5* 98.1 96.8  PLT 217 196 188    BMP &GFR Recent Labs  Lab 08/13/19 1652 08/14/19 0400 08/16/19 0518  NA 139 139 138  K 4.3 3.5 3.6  CL 107 104 99  CO2 21* 24 26  GLUCOSE 253* 154* 134*  BUN 25* 17 14  CREATININE 0.72 0.35* 0.39*  CALCIUM 8.4* 8.6* 8.8*  MG  --   --  1.8  PHOS  --   --  2.0*   Estimated Creatinine Clearance: 47.9 mL/min (A) (by C-G formula based on SCr of 0.39 mg/dL (L)). Liver & Pancreas: Recent Labs  Lab 08/16/19 0518  ALBUMIN 2.6*   No results for input(s): LIPASE, AMYLASE in the last 168 hours. Recent Labs  Lab 08/15/19 1524  AMMONIA 60*   Diabetic: No results for input(s): HGBA1C in the last 72 hours. Recent Labs  Lab 08/15/19 0610 08/15/19 1245 08/15/19 1634 08/15/19 2124 08/16/19 0624  GLUCAP 151* 132* 264* 175* 129*   Cardiac Enzymes: Recent Labs  Lab 08/15/19 1524  CKTOTAL 27*  No results for input(s): PROBNP in the last 8760 hours. Coagulation Profile: No results for input(s): INR, PROTIME in the last 168 hours. Thyroid Function Tests: Recent Labs    08/15/19 0317 08/15/19 1524  TSH 0.089*  --   FREET4  --  1.24*   Lipid Profile: No results for input(s): CHOL, HDL, LDLCALC, TRIG, CHOLHDL, LDLDIRECT in the last 72 hours. Anemia Panel: Recent Labs    08/15/19 0317  VITAMINB12 3,480*   Urine analysis:    Component Value Date/Time   COLORURINE AMBER (A) 08/13/2019 2030   APPEARANCEUR CLEAR 08/13/2019 2030   LABSPEC 1.020 08/13/2019 2030   PHURINE 5.0 08/13/2019 2030   GLUCOSEU >=500 (A) 08/13/2019 2030   HGBUR NEGATIVE 08/13/2019 2030   BILIRUBINUR NEGATIVE 08/13/2019 2030   KETONESUR 5 (A) 08/13/2019 2030   PROTEINUR NEGATIVE 08/13/2019 2030   UROBILINOGEN 0.2 03/19/2015 1945   NITRITE NEGATIVE 08/13/2019 2030   LEUKOCYTESUR NEGATIVE 08/13/2019 2030   Sepsis Labs: Invalid input(s): PROCALCITONIN, LACTICIDVEN  Microbiology: No results found for this or any previous visit (from the past 240 hour(s)).  Radiology Studies: No results  found.    Miah Boye T. [facility]  If 7PM-7AM, please contact night-coverage [email or web address] Password [email or web address] 08/16/2019, 11:16 AM

## 2019-08-17 DIAGNOSIS — R5381 Other malaise: Secondary | ICD-10-CM

## 2019-08-17 DIAGNOSIS — E876 Hypokalemia: Secondary | ICD-10-CM

## 2019-08-17 LAB — RENAL FUNCTION PANEL
Albumin: 2.6 g/dL — ABNORMAL LOW (ref 3.5–5.0)
Anion gap: 12 (ref 5–15)
BUN: 12 mg/dL (ref 8–23)
CO2: 29 mmol/L (ref 22–32)
Calcium: 8.8 mg/dL — ABNORMAL LOW (ref 8.9–10.3)
Chloride: 100 mmol/L (ref 98–111)
Creatinine, Ser: 0.37 mg/dL — ABNORMAL LOW (ref 0.44–1.00)
GFR calc Af Amer: >60 mL/min (ref >60)
GFR calc non Af Amer: >60 mL/min (ref >60)
Glucose, Bld: 109 mg/dL — ABNORMAL HIGH (ref 70–99)
Phosphorus: 2.6 mg/dL (ref 2.5–4.6)
Potassium: 3.3 mmol/L — ABNORMAL LOW (ref 3.5–5.1)
Sodium: 141 mmol/L (ref 135–145)

## 2019-08-17 LAB — GLUCOSE, CAPILLARY
Glucose-Capillary: 98 mg/dL (ref 70–99)
Glucose-Capillary: 107 mg/dL — ABNORMAL HIGH (ref 70–99)
Glucose-Capillary: 126 mg/dL — ABNORMAL HIGH (ref 70–99)
Glucose-Capillary: 201 mg/dL — ABNORMAL HIGH (ref 70–99)
Glucose-Capillary: 260 mg/dL — ABNORMAL HIGH (ref 70–99)

## 2019-08-17 LAB — CBC
HCT: 36 % (ref 36.0–46.0)
Hemoglobin: 12.1 g/dL (ref 12.0–15.0)
MCH: 32.3 pg (ref 26.0–34.0)
MCHC: 33.6 g/dL (ref 30.0–36.0)
MCV: 96 fL (ref 80.0–100.0)
Platelets: 191 10*3/uL (ref 150–400)
RBC: 3.75 MIL/uL — ABNORMAL LOW (ref 3.87–5.11)
RDW: 15.4 % (ref 11.5–15.5)
WBC: 9.8 10*3/uL (ref 4.0–10.5)
nRBC: 0 % (ref 0.0–0.2)

## 2019-08-17 LAB — MAGNESIUM: Magnesium: 2.1 mg/dL (ref 1.7–2.4)

## 2019-08-17 MED ORDER — BETHANECHOL CHLORIDE 10 MG PO TABS
25.0000 mg | ORAL_TABLET | Freq: Three times a day (TID) | ORAL | Status: DC
  Administered 2019-08-17 – 2019-08-21 (×13): 25 mg via ORAL
  Filled 2019-08-17 (×8): qty 3
  Filled 2019-08-17: qty 2.5
  Filled 2019-08-17 (×4): qty 3

## 2019-08-17 MED ORDER — POTASSIUM CHLORIDE CRYS ER 20 MEQ PO TBCR
40.0000 meq | EXTENDED_RELEASE_TABLET | Freq: Once | ORAL | Status: AC
  Administered 2019-08-17: 40 meq via ORAL
  Filled 2019-08-17: qty 2

## 2019-08-17 NOTE — Progress Notes (Signed)
Potassium 3.3, provider notified. No new orders at this time.

## 2019-08-17 NOTE — NC FL2 (Signed)
[place] MEDICAID FL2 LEVEL OF CARE SCREENING TOOL     IDENTIFICATION  Patient Name: Tracey Porter Birthdate: 1930/03/26 Sex: female Admission Date (Current Location): 08/13/2019  [facility] and [place] Number:  Producer, television/film/video and Address:  [facility], [address] [place], [place] [address]      Provider Number: [id number]  Attending Physician Name and Address:  Almon Hercules, MD  Relative Name and Phone Number:       Current Level of Care: Hospital Recommended Level of Care: Skilled Nursing Facility Prior Approval Number:    Date Approved/Denied:   PASRR Number:   [id number]  Discharge Plan: SNF    Current Diagnoses: Patient Active Problem List   Diagnosis Date Noted  . Pressure injury of skin 08/14/2019  . Acute encephalopathy 08/14/2019  . Abnormal EKG 08/14/2019  . Urinary retention 08/14/2019  . Generalized weakness 08/13/2019  . Acute respiratory disease due to COVID-19 virus 06/23/2019  . Acute respiratory failure with hypoxia (HCC) 06/23/2019  . Displaced fracture of right femoral neck (HCC) 04/30/2018  . Chronic back pain 10/10/2017  . Dyslipidemia 10/10/2017  . PAF (paroxysmal atrial fibrillation) (HCC) 07/28/2017  . Orthostatic hypotension 07/28/2017  . History of GI bleed 07/28/2017  . Chronic diastolic heart failure (HCC)   . Multiple fractures of pelvis with unstable disruption of pelvic ring, initial encounter for closed fracture (HCC) 03/20/2017  . Atrial fibrillation with RVR (HCC) 03/20/2017  . Aortic atherosclerosis (HCC) 03/20/2017  . Hypotension due to blood loss   . Chronic anticoagulation   . Closed pelvic fracture (HCC) 03/19/2017  . Leukocytosis 03/19/2017  . Fall at home, initial encounter 03/19/2017  . Paroxysmal SVT (supraventricular tachycardia) (HCC)   . Essential hypertension   . Non-insulin treated type 2 diabetes mellitus (HCC) 03/20/2015  . Hypokalemia 03/19/2015    Orientation  RESPIRATION BLADDER Height & Weight     Self, Place  Normal Indwelling catheter Weight: 150 lb 12.7 oz (68.4 kg) Height:  5\' 4"  (162.6 cm)  BEHAVIORAL SYMPTOMS/MOOD NEUROLOGICAL BOWEL NUTRITION STATUS      Incontinent Diet(see DC summary)  AMBULATORY STATUS COMMUNICATION OF NEEDS Skin   Extensive Assist Verbally PU Stage and Appropriate Care PU Stage 1 Dressing: (foam dressing, lift every shift to assess and change PRN)                     Personal Care Assistance Level of Assistance  Bathing, Feeding, Dressing Bathing Assistance: Maximum assistance Feeding assistance: Limited assistance Dressing Assistance: Maximum assistance     Functional Limitations Info  Speech     Speech Info: Impaired(delayed responses)    SPECIAL CARE FACTORS FREQUENCY  PT (By licensed PT), OT (By licensed OT), Speech therapy     PT Frequency: 5x/wk OT Frequency: 5x/wk     Speech Therapy Frequency: 5x/wk      Contractures Contractures Info: Not present    Additional Factors Info  Code Status, Allergies, Insulin Sliding Scale Code Status Info: Full Allergies Info: Penicillins   Insulin Sliding Scale Info: 0-9 units 3x/day with meals; 0-5 units daily at bed       Current Medications (08/17/2019):  This is the current hospital active medication list Current Facility-Administered Medications  Medication Dose Route Frequency Provider Last Rate Last Admin  . acetaminophen (TYLENOL) tablet 650 mg  650 mg Oral Q6H PRN 10/15/2019, DO   650 mg at 08/16/19 1410   Or  . acetaminophen (TYLENOL) suppository  650 mg  650 mg Rectal Q6H PRN Hillary Bow, DO      . acetaminophen (TYLENOL) tablet 1,000 mg  1,000 mg Oral BID Lyda Perone M, DO   1,000 mg at 08/17/19 0941  . amiodarone (PACERONE) tablet 200 mg  200 mg Oral Daily Lyda Perone M, DO   200 mg at 08/17/19 0948  . apixaban (ELIQUIS) tablet 5 mg  5 mg Oral BID Lyda Perone M, DO   5 mg at 08/17/19 0941  . ascorbic acid (VITAMIN  C) tablet 500 mg  500 mg Oral BID Lyda Perone M, DO   500 mg at 08/17/19 0941  . bethanechol (URECHOLINE) tablet 25 mg  25 mg Oral TID Candelaria Stagers T, MD   25 mg at 08/17/19 1243  . Chlorhexidine Gluconate Cloth 2 % PADS 6 each  6 each Topical Daily Almon Hercules, MD   6 each at 08/17/19 0945  . cholecalciferol (VITAMIN D3) tablet 5,000 Units  5,000 Units Oral Daily Hillary Bow, DO   5,000 Units at 08/17/19 0940  . conjugated estrogens (PREMARIN) vaginal cream 1 Applicatorful  1 Applicatorful Vaginal QODAY Lyda Perone M, DO   1 Applicatorful at 08/16/19 1102  . dexamethasone (DECADRON) tablet 4 mg  4 mg Oral Daily Daylene Posey, RPH   4 mg at 08/17/19 0941   Followed by  . [START ON 08/21/2019] dexamethasone (DECADRON) tablet 2 mg  2 mg Oral Daily Daylene Posey, RPH       Followed by  . [START ON 08/25/2019] dexamethasone (DECADRON) tablet 1 mg  1 mg Oral Daily Daylene Posey, RPH      . ferrous sulfate tablet 325 mg  325 mg Oral Q breakfast Hillary Bow, DO   325 mg at 08/17/19 0941  . insulin aspart (novoLOG) injection 0-5 Units  0-5 Units Subcutaneous QHS Hillary Bow, DO   2 Units at 08/14/19 2245  . insulin aspart (novoLOG) injection 0-9 Units  0-9 Units Subcutaneous TID WC Hillary Bow, DO   1 Units at 08/17/19 1243  . lactulose (CHRONULAC) 10 GM/15ML solution 20 g  20 g Oral BID Candelaria Stagers T, MD   20 g at 08/17/19 0940  . lidocaine (LIDODERM) 5 % 1 patch  1 patch Transdermal PRN Hillary Bow, DO      . magnesium chloride (SLOW-MAG) 64 MG SR tablet 64 mg  64 mg Oral Daily Lyda Perone M, DO   64 mg at 08/17/19 0940  . Melatonin TABS 3 mg  3 mg Oral QHS Lyda Perone M, DO   3 mg at 08/16/19 2141  . multivitamin with minerals tablet 1 tablet  1 tablet Oral BID Hillary Bow, DO   1 tablet at 08/17/19 0941  . ondansetron (ZOFRAN) tablet 4 mg  4 mg Oral Q6H PRN Hillary Bow, DO       Or  . ondansetron [facility]) injection 4 mg  4 mg Intravenous Q6H PRN  Hillary Bow, DO      . pantoprazole (PROTONIX) EC tablet 40 mg  40 mg Oral Daily Lyda Perone M, DO   40 mg at 08/17/19 0941  . polyethylene glycol (MIRALAX / GLYCOLAX) packet 17 g  17 g Oral Daily Lyda Perone M, DO   17 g at 08/15/19 1035  . polyvinyl alcohol (LIQUIFILM TEARS) 1.4 % ophthalmic solution 1 drop  1 drop Both Eyes BID Hillary Bow, DO   1 drop at 08/17/19 9381  .  prednisoLONE acetate (PRED FORTE) 1 % ophthalmic suspension 1 drop  1 drop Right Eye Daily Etta Quill, DO   1 drop at 08/17/19 1638  . senna-docusate (Senokot-S) tablet 1 tablet  1 tablet Oral Daily PRN Etta Quill, DO      . timolol (TIMOPTIC) 0.5 % ophthalmic solution 1 drop  1 drop Both Eyes BID Etta Quill, DO   1 drop at 08/17/19 [phone]  . traZODone (DESYREL) tablet 50 mg  50 mg Oral QHS PRN Etta Quill, DO         Discharge Medications: Please see discharge summary for a list of discharge medications.  Relevant Imaging Results:  Relevant Lab Results:   Additional Information SS#: [id number]  Geralynn Ochs, LCSW

## 2019-08-17 NOTE — Progress Notes (Signed)
Physical Therapy Treatment Patient Details Name: Tracey Porter MRN: [id number] DOB: 05-24-1930 Today's Date: 08/17/2019    History of Present Illness 86yo female c/o generalized weakness, reduced oral intake, and found to have UTI in ED. Also found to have orthostatic hypotension and acute encepthalopathy with further workup. PMH HTN, DM, R THA, dementia, A-fib on Eliquis, Covid 06/2019    PT Comments    Pt performed increased activity but required increased assistance.  Pt agitated at times but easily redirectable.  She is very deconditioned and limited due to R IR and hyper-extension of R knee.  Informed CM of need for higher level of care at this time.     Follow Up Recommendations  SNF(Pt is not making progress and will require SNF placement.)     Equipment Recommendations  Other (comment)(TBD)    Recommendations for Other Services       Precautions / Restrictions Precautions Precautions: Fall;Other (comment) Precaution Comments: dementia, can be agitated and has known sundowning. orthostatic Restrictions Weight Bearing Restrictions: No    Mobility  Bed Mobility Overal bed mobility: Needs Assistance Bed Mobility: Supine to Sit;Sit to Supine     Supine to sit: Mod assist Sit to supine: Max assist;+2 for physical assistance   General bed mobility comments: Mod assistance to come to sitting and max +2 to return to bed due to fatigue.    Pt very fatigued after multiple reps of standing.  Transfers Overall transfer level: Needs assistance Equipment used: Rolling walker (2 wheeled);Ambulation equipment used(sara stedy) Transfers: Sit to/from [employer] transfer comment: 1st attempt with RW and unable to clear bottom to come to standing.  2nd attempt performed total assistance+1 to pivot from bed to chair.  Once sitting in chair she required assistance to move into standing and sara stedy utilized with max +2 assistance for multiple  attempts as she continued to present with stool incontinence on each standing trial.  All standing trials pt presents with R IR of hip and flexed posture.  Noted hyperextension of R knee.  Ambulation/Gait                 Stairs             Wheelchair Mobility    Modified Rankin (Stroke Patients Only)       Balance Overall balance assessment: Needs assistance Sitting-balance support: Bilateral upper extremity supported;Feet supported Sitting balance-Leahy Scale: Fair       Standing balance-Leahy Scale: Poor                              Cognition Arousal/Alertness: Awake/alert Behavior During Therapy: Agitated Overall Cognitive Status: History of cognitive impairments - at baseline                                 General Comments: hx of dementia at baseline. resistant but did complete the tasks i asked her to today      Exercises      General Comments        Pertinent Vitals/Pain Pain Assessment: Faces Faces Pain Scale: Hurts whole lot Pain Location: "all over, it all hurts", R LE with movement. Pain Descriptors / Indicators: Aching;Moaning;Guarding;Grimacing Pain Intervention(s): Monitored during session;Repositioned    Home Living  Prior Function            PT Goals (current goals can now be found in the care plan section) Acute Rehab PT Goals Patient Stated Goal: "I want to lay back down" Potential to Achieve Goals: Fair Progress towards PT goals: Progressing toward goals    Frequency    Min 3X/week      PT Plan Current plan remains appropriate    Co-evaluation              AM-PAC PT "6 Clicks" Mobility   Outcome Measure  Help needed turning from your back to your side while in a flat bed without using bedrails?: A Little Help needed moving from lying on your back to sitting on the side of a flat bed without using bedrails?: A Little Help needed moving to and from a  bed to a chair (including a wheelchair)?: A Little Help needed standing up from a chair using your arms (e.g., wheelchair or bedside chair)?: A Little Help needed to walk in hospital room?: A Little Help needed climbing 3-5 steps with a railing? : A Little 6 Click Score: 18    End of Session Equipment Utilized During Treatment: Gait belt Activity Tolerance: Treatment limited secondary to medical complications (Comment) Patient left: in bed;with call bell/phone within reach;with bed alarm set Nurse Communication: Mobility status(informed nursing and MD of need for [facility] boot on R side to avoid excessive IR when resting.) PT Visit Diagnosis: Muscle weakness (generalized) (M62.81);Unsteadiness on feet (R26.81);History of falling (Z91.81)     Time: 9323-5573 PT Time Calculation (min) (ACUTE ONLY): 20 min  Charges:  $Therapeutic Activity: 8-22 mins                     Erasmo Leventhal , PTA Acute Rehabilitation Services Pager [phone] Office [phone]     Kolten Ryback Eli Hose 08/17/2019, 4:01 PM

## 2019-08-17 NOTE — Progress Notes (Addendum)
Occupational Therapy Treatment Patient Details Name: Tracey Porter MRN: [id number] DOB: Feb 09, 1930 Today's Date: 08/17/2019    History of present illness 86yo female c/o generalized weakness, reduced oral intake, and found to have UTI in ED. Also found to have orthostatic hypotension and acute encepthalopathy with further workup. PMH HTN, DM, R THA, dementia, A-fib on Eliquis, Covid 06/2019   OT comments  Pt. Seen for skilled OT.  Initially resistant to any activity and telling me to leave the room. Once I explained and I listed specifically what needed to be done she did complete the desired tasks but would not go beyond eob.  Bed mobilty to eob min guard a. Back to back mod a.  Lb dressing mod a seated eob.   BP SUPINE: 89/53-64 BP SITTING: 103/68-80  Follow Up Recommendations  Other (comment)    Equipment Recommendations  None recommended by OT    Recommendations for Other Services      Precautions / Restrictions Precautions Precautions: Fall;Other (comment) Precaution Comments: dementia, can be agitated and has known sundowning. orthostatic       Mobility Bed Mobility Overal bed mobility: Needs Assistance Bed Mobility: Supine to Sit;Sit to Supine     Supine to sit: Min guard Sit to supine: Mod assist   General bed mobility comments: pt. able to transition into sitting and scoot to eob without physical assistance. when time for back to bed. layed down with b les hanging off the bed and yelled "go on help me" mod a to bring bles into bed, pt. making attempts to scoot and straighten up in the bed. also adjusting her own blankets  Transfers                 General transfer comment: declined oob "oh no we are not, because we are just not"    Balance                                           ADL either performed or assessed with clinical judgement   ADL Overall ADL's : Needs assistance/impaired                     Lower Body  Dressing: Moderate assistance;Sitting/lateral leans Lower Body Dressing Details (indicate cue type and reason): adjust socks seated eob               General ADL Comments: decreased initiation and resistant.  upon arrival before i spoke pt. states "nope get out of here".  mentioned multiple times to "stop talking" or that i was "annoying".  "swatted" at my arm x1. reviewed we are not allowed to put our hands on each other like that. pt. states "ok, im sorry about that"     Vision       Perception     Praxis      Cognition Arousal/Alertness: Lethargic Behavior During Therapy: Flat affect;Agitated Overall Cognitive Status: History of cognitive impairments - at baseline                                 General Comments: hx of dementia at baseline. resistant but did complete the tasks i asked her to today        Exercises     Shoulder Instructions       General Comments  Pertinent Vitals/ Pain       Pain Assessment: Faces Faces Pain Scale: Hurts even more Pain Location: "all over, it all hurts" Pain Descriptors / Indicators: Aching  Home Living                                          Prior Functioning/Environment              Frequency  Min 2X/week        Progress Toward Goals  OT Goals(current goals can now be found in the care plan section)  Progress towards OT goals: Progressing toward goals     Plan      Co-evaluation                 AM-PAC OT "6 Clicks" Daily Activity     Outcome Measure   Help from another person eating meals?: A Little Help from another person taking care of personal grooming?: A Lot Help from another person toileting, which includes using toliet, bedpan, or urinal?: A Lot Help from another person bathing (including washing, rinsing, drying)?: A Lot Help from another person to put on and taking off regular upper body clothing?: A Lot Help from another person to put on and  taking off regular lower body clothing?: A Lot 6 Click Score: 13    End of Session    OT Visit Diagnosis: Unsteadiness on feet (R26.81);Muscle weakness (generalized) (M62.81);Other symptoms and signs involving cognitive function   Activity Tolerance Patient tolerated treatment well   Patient Left in bed;with call bell/phone within reach;with bed alarm set   Nurse Communication          Time: 3546-5681 OT Time Calculation (min): 13 min  Charges: OT General Charges $OT Visit: 1 Visit OT Treatments $Therapeutic Activity: 8-22 mins  Boneta Lucks, COTA/L Acute Rehabilitation [phone]   Robet Leu 08/17/2019, 12:35 PM

## 2019-08-17 NOTE — Progress Notes (Signed)
PROGRESS NOTE  Tracey Porter SHF:[id number] DOB: Oct 24, 1929   PCP: Florentina Jenny, MD  Patient is from: ALF  DOA: 08/13/2019 LOS: 3  Brief Narrative / Interim history: 86 year old female with history of dementia, diet-controlled DM-2, HTN, paroxysmal A. fib on Eliquis and hospitalization for COVID-19 pneumonia 2 months ago presenting with generalized weakness and decreased oral intake in the setting of urinary tract infection.  She also had orthostatic hypotension and acute encephalopathy.   Subjective: No major events overnight or this morning.  Sleepy but wakes to voice.  Does not want to answer questions stating "leave me alone.  Let me sleep".  Does not appear to be in distress.  Also refusing to wear TED hose.  Objective: Vitals:   08/17/19 0856 08/17/19 1214 08/17/19 1534 08/17/19 1606  BP: 113/64 118/68 112/69 (!) 100/48  Pulse: 83 84 90 84  Resp: 16 16 20 20   Temp: 97.9 F (36.6 C) 97.6 F (36.4 C) 98.4 F (36.9 C) 98 F (36.7 C)  TempSrc: Oral Oral Oral Oral  SpO2: 93% 92% 92% 93%  Weight:      Height:        Intake/Output Summary (Last 24 hours) at 08/17/2019 1647 Last data filed at 08/17/2019 0900 Gross per 24 hour  Intake 100 ml  Output 1400 ml  Net -1300 ml   Filed Weights   08/15/19 0500 08/16/19 0439 08/17/19 0342  Weight: 68.2 kg 71.3 kg 68.4 kg    Examination:  GENERAL: No apparent distress.  Nontoxic. HEENT: MMM.  Vision and hearing grossly intact.  RESP: On room air.  No increased work of breathing. CVS: Hemodynamically stable. ABD/GI/GU: Patient refused exam MSK/EXT: No apparent deformity noted. SKIN: no apparent skin lesion or wound NEURO: Sleepy but easily wakes to voice.  No apparent focal neuro deficit PSYCH: Sleepy but wakes to voice.  Unhappy  Procedures:  None  Assessment & Plan: Generalized weakness: unclear etiology but multiple potential etiologies including UTI, chronic steroid, orthostasis, psychogenic, delirium and generalized  deconditioning from recent COVID-19 infection.  Echo not revealing.  B12 high.  TSH low.  Free T4 slightly high.  CK normal. CT head without acute finding.  No apparent focal neuro deficit. -Continue tapering Decadron. -PT/OT/OOB-SNF  Acute metabolic encephalopathy in patient with dementia: Improving. Work-up not revealing except for mild elevated ammonia.  Mild leukocytosis but no other signs of infection.  No apparent focal neuro deficit.  -Treat treatable causes -Reduce lactulose to 20 mg twice daily  Orthostatic hypotension: seems to have resolved based on lying down sitting BP but not able to perform standing BP.  Refusing TED hose. -Discontinued home amlodipine, Cardizem and labetalol -Apply TED hose -Discontinue IV fluid  Paroxysmal A. Fib: Rate controlled. -Continue amiodarone and Eliquis.  Chronic diastolic CHF: Echo with EF of 65 to 70% and G1 DD.  Appears euvolemic. -Discontinue IV fluids -Monitor fluid status.  Acute urinary retention: Reportedly had 1200 cc on I&O cath on presentation. Foley catheter inserted on 2/3 -Dr. 10/15/19, urology-voiding trial in a week, and outpatient follow-up if she fails. -Discontinue home Myrbetriq -Bethanechol 25 mg 3 times daily  Low TSH/slightly elevated free T4: Likely due to amiodarone. -Recheck in 4 to 6 weeks outpatient.  Hypokalemia: Likely due to IV fluid. -Discontinue IV fluid -Replenish and recheck  Generalized weakness/debility/physical deconditioning -PT/OT-SNF  Pressure injury of skin Pressure Injury 08/14/19 Left Stage 1 -  Intact skin with non-blanchable redness of a localized area usually over a bony prominence. old red firm (  Active)  08/14/19 0215  Location:   Location Orientation: Left  Staging: Stage 1 -  Intact skin with non-blanchable redness of a localized area usually over a bony prominence.  Wound Description (Comments): old red firm  Present on Admission: Yes               DVT prophylaxis: On  Eliquis for atrial fibrillation Code Status: Full code Family Communication: Patient and/or RN.  Attempted to call patient's daughter twice but no answer.  Discharge barrier: Medically stable for discharge pending safe disposition/SNF.  Patient is from: ALF Final disposition: Likely SNF.  Consultants: None   Microbiology summarized: None  Sch Meds:  Scheduled Meds: . acetaminophen  1,000 mg Oral BID  . amiodarone  200 mg Oral Daily  . apixaban  5 mg Oral BID  . vitamin C  500 mg Oral BID  . bethanechol  25 mg Oral TID  . Chlorhexidine Gluconate Cloth  6 each Topical Daily  . cholecalciferol  5,000 Units Oral Daily  . conjugated estrogens  1 Applicatorful Vaginal QODAY  . dexamethasone  4 mg Oral Daily   Followed by  . [START ON 08/21/2019] dexamethasone  2 mg Oral Daily   Followed by  . [START ON 08/25/2019] dexamethasone  1 mg Oral Daily  . ferrous sulfate  325 mg Oral Q breakfast  . insulin aspart  0-5 Units Subcutaneous QHS  . insulin aspart  0-9 Units Subcutaneous TID WC  . lactulose  20 g Oral BID  . magnesium chloride  64 mg Oral Daily  . Melatonin  3 mg Oral QHS  . multivitamin with minerals  1 tablet Oral BID  . pantoprazole  40 mg Oral Daily  . polyethylene glycol  17 g Oral Daily  . polyvinyl alcohol  1 drop Both Eyes BID  . prednisoLONE acetate  1 drop Right Eye Daily  . timolol  1 drop Both Eyes BID   Continuous Infusions:  PRN Meds:.acetaminophen **OR** acetaminophen, lidocaine, ondansetron **OR** ondansetron (ZOFRAN) IV, senna-docusate, traZODone  Antimicrobials: Anti-infectives (From admission, onward)   Start     Dose/Rate Route Frequency Ordered Stop   08/13/19 2359  acyclovir (ZOVIRAX) tablet 800 mg  Status:  Discontinued     800 mg Oral 2 times daily 08/13/19 2208 08/17/19 0814   08/13/19 2215  nitrofurantoin (macrocrystal-monohydrate) (MACROBID) capsule 100 mg  Status:  Discontinued     100 mg Oral Every 12 hours 08/13/19 2214 08/14/19 1114        I have personally reviewed the following labs and images: CBC: Recent Labs  Lab 08/13/19 1652 08/14/19 0400 08/16/19 0518 08/17/19 0336  WBC 11.2* 12.3* 10.9* 9.8  HGB 13.0 13.6 12.2 12.1  HCT 39.2 40.6 36.4 36.0  MCV 100.5* 98.1 96.8 96.0  PLT 217 196 188 191   BMP &GFR Recent Labs  Lab 08/13/19 1652 08/14/19 0400 08/16/19 0518 08/17/19 0336  NA 139 139 138 141  K 4.3 3.5 3.6 3.3*  CL 107 104 99 100  CO2 21* 24 26 29   GLUCOSE 253* 154* 134* 109*  BUN 25* 17 14 12   CREATININE 0.72 0.35* 0.39* 0.37*  CALCIUM 8.4* 8.6* 8.8* 8.8*  MG  --   --  1.8 2.1  PHOS  --   --  2.0* 2.6   Estimated Creatinine Clearance: 47.1 mL/min (A) (by C-G formula based on SCr of 0.37 mg/dL (L)). Liver & Pancreas: Recent Labs  Lab 08/16/19 0518 08/17/19 0336  ALBUMIN 2.6* 2.6*  No results for input(s): LIPASE, AMYLASE in the last 168 hours. Recent Labs  Lab 08/15/19 1524  AMMONIA 60*   Diabetic: No results for input(s): HGBA1C in the last 72 hours. Recent Labs  Lab 08/16/19 2130 08/17/19 0628 08/17/19 0757 08/17/19 1218 08/17/19 1603  GLUCAP 161* 98 107* 126* 201*   Cardiac Enzymes: Recent Labs  Lab 08/15/19 1524  CKTOTAL 27*   No results for input(s): PROBNP in the last 8760 hours. Coagulation Profile: No results for input(s): INR, PROTIME in the last 168 hours. Thyroid Function Tests: Recent Labs    08/15/19 0317 08/15/19 1524  TSH 0.089*  --   FREET4  --  1.24*   Lipid Profile: No results for input(s): CHOL, HDL, LDLCALC, TRIG, CHOLHDL, LDLDIRECT in the last 72 hours. Anemia Panel: Recent Labs    08/15/19 0317  VITAMINB12 3,480*   Urine analysis:    Component Value Date/Time   COLORURINE AMBER (A) 08/13/2019 2030   APPEARANCEUR CLEAR 08/13/2019 2030   LABSPEC 1.020 08/13/2019 2030   PHURINE 5.0 08/13/2019 2030   GLUCOSEU >=500 (A) 08/13/2019 2030   HGBUR NEGATIVE 08/13/2019 2030   BILIRUBINUR NEGATIVE 08/13/2019 2030   KETONESUR 5 (A)  08/13/2019 2030   PROTEINUR NEGATIVE 08/13/2019 2030   UROBILINOGEN 0.2 03/19/2015 1945   NITRITE NEGATIVE 08/13/2019 2030   LEUKOCYTESUR NEGATIVE 08/13/2019 2030   Sepsis Labs: Invalid input(s): PROCALCITONIN, LACTICIDVEN  Microbiology: No results found for this or any previous visit (from the past 240 hour(s)).  Radiology Studies: No results found.    Bralynn Donado T. Lucrezia Dehne [facility]  If 7PM-7AM, please contact night-coverage [email or web address] Password [facility] 08/17/2019, 4:47 PM

## 2019-08-17 NOTE — Progress Notes (Signed)
Orthopedic Tech Progress Note Patient Details:  Tracey Porter 1929-08-04 [id number] RN BROOKE sent a page requesting a PRAFO BOOT for the right foot Ortho Devices Type of Ortho Device: Prafo boot/shoe Ortho Device/Splint Location: RLE Ortho Device/Splint Interventions: Application, Ordered   Post Interventions Patient Tolerated: Well Instructions Provided: Care of device, Adjustment of device   Donald Pore 08/17/2019, 4:44 PM

## 2019-08-17 NOTE — TOC Progression Note (Signed)
Transition of Care [facility]) - Progression Note    Patient Details  Name: Tracey Porter MRN: [id number] Date of Birth: 1929-07-17  [facility] of [facility]) CM/SW Contact  Baldemar Lenis, [place] Phone Number: 08/17/2019, 3:35 PM  Clinical Narrative:   CSW alerted by MD that patient is stable, but unsure if ALF can handle her at her current functional level. CSW asked PT and OT to work with patient now that she is more stable and see what she is able to do to determine most appropriate level of care. Therapies recommending SNF at this time. CSW contacted daughter, Victorino Dike, to discuss, and Victorino Dike is agreeable to SNF. Victorino Dike indicated that patient had been at [facility] previously, but [facility] is unable to admit patients at this time. CSW received permission to fax out referral and will update Victorino Dike with bed offers when received. CSW to follow.    Expected Discharge Plan: Skilled Nursing Facility Barriers to Discharge: Continued Medical Work up  Expected Discharge Plan and Services Expected Discharge Plan: Skilled Nursing Facility     Post Acute Care Choice: Home Health Living arrangements for the past 2 months: Assisted Living Facility                           HH Arranged: PT, OT, Speech Therapy HH Agency: [facility] Date [facility] Agency Contacted: 08/15/19 Time HH Agency Contacted: [phone] Representative spoke with at [facility] Agency: Rene Kocher   Social Determinants of Health (SDOH) Interventions    Readmission Risk Interventions No flowsheet data found.

## 2019-08-18 DIAGNOSIS — R4589 Other symptoms and signs involving emotional state: Secondary | ICD-10-CM

## 2019-08-18 LAB — RENAL FUNCTION PANEL
Albumin: 2.6 g/dL — ABNORMAL LOW (ref 3.5–5.0)
Anion gap: 10 (ref 5–15)
BUN: 15 mg/dL (ref 8–23)
CO2: 27 mmol/L (ref 22–32)
Calcium: 8.7 mg/dL — ABNORMAL LOW (ref 8.9–10.3)
Chloride: 102 mmol/L (ref 98–111)
Creatinine, Ser: 0.5 mg/dL (ref 0.44–1.00)
GFR calc Af Amer: >60 mL/min (ref >60)
GFR calc non Af Amer: >60 mL/min (ref >60)
Glucose, Bld: 98 mg/dL (ref 70–99)
Phosphorus: 2.1 mg/dL — ABNORMAL LOW (ref 2.5–4.6)
Potassium: 3.8 mmol/L (ref 3.5–5.1)
Sodium: 139 mmol/L (ref 135–145)

## 2019-08-18 LAB — MAGNESIUM: Magnesium: 1.9 mg/dL (ref 1.7–2.4)

## 2019-08-18 LAB — GLUCOSE, CAPILLARY
Glucose-Capillary: 101 mg/dL — ABNORMAL HIGH (ref 70–99)
Glucose-Capillary: 128 mg/dL — ABNORMAL HIGH (ref 70–99)
Glucose-Capillary: 170 mg/dL — ABNORMAL HIGH (ref 70–99)
Glucose-Capillary: 217 mg/dL — ABNORMAL HIGH (ref 70–99)
Glucose-Capillary: 191 mg/dL — ABNORMAL HIGH (ref 70–99)

## 2019-08-18 MED ORDER — SERTRALINE HCL 50 MG PO TABS
25.0000 mg | ORAL_TABLET | Freq: Every day | ORAL | Status: DC
  Administered 2019-08-18 – 2019-08-21 (×4): 25 mg via ORAL
  Filled 2019-08-18 (×4): qty 1

## 2019-08-18 NOTE — Progress Notes (Signed)
PROGRESS NOTE  Tracey Porter KKX:[id number] DOB: 1930-04-08   PCP: Florentina Jenny, MD  Patient is from: ALF  DOA: 08/13/2019 LOS: 4  Brief Narrative / Interim history: 86 year old female with history of dementia, diet-controlled DM-2, HTN, paroxysmal A. fib on Eliquis and hospitalization for COVID-19 pneumonia 2 months ago presenting with generalized weakness and decreased oral intake in the setting of recent urinary tract infection.  She was admitted for generalized weakness and acute encephalopathy.  She also had orthostatic hypotension and acute urinary retention requiring indwelling Foley catheter.  Orthostatic hypotension resolved with IV fluid and adjustments of a cardiac medications.  However, she remained weak without focal neuro deficit.  She had extensive work-up but not revealing.  She was evaluated by PT/OT who recommended SNF.   In regards to occasional retention, urology consulted and recommended indwelling Foley catheter for 1 week before voiding trial.  Subjective: No major events overnight or this morning.  She says she feels tired and wants to sleep.  She denies pain anywhere.  She denies dyspnea, GI or GU symptoms.  However, she is not a great historian.  She is oriented to self.  She thinks she is at rehab.  Objective: Vitals:   08/18/19 0306 08/18/19 0401 08/18/19 0805 08/18/19 1217  BP:  129/86 (!) 103/59 117/69  Pulse:  88 (!) 104 91  Resp:  16 20 16   Temp:  (!) 97.5 F (36.4 C) 97.7 F (36.5 C) 98.2 F (36.8 C)  TempSrc:  Oral Oral Oral  SpO2:  94% 97% 98%  Weight: 67.7 kg     Height:        Intake/Output Summary (Last 24 hours) at 08/18/2019 1223 Last data filed at 08/18/2019 0800 Gross per 24 hour  Intake --  Output 900 ml  Net -900 ml   Filed Weights   08/16/19 0439 08/17/19 0342 08/18/19 0306  Weight: 71.3 kg 68.4 kg 67.7 kg    Examination:  GENERAL: No apparent distress.  Sleepy. HEENT: MMM.  Vision and hearing grossly intact.  NECK: Supple.   No apparent JVD.  RESP:  No IWOB.  Aeration bilaterally. CVS:  RRR. Heart sounds normal.  ABD/GI/GU: Bowel sounds present. Soft. Non tender.  MSK/EXT:  Moves extremities. No apparent deformity. No edema.  SKIN: no apparent skin lesion or wound NEURO: Sleepy but wakes to voice easily.  Oriented to self.  She thinks she is at rehab. No apparent focal neuro deficit other than global weakness. PSYCH: Sleepy.  Flat affect.  Procedures:  None  Assessment & Plan: Generalized weakness: unclear etiology but multiple potential etiologies including UTI, chronic steroid, orthostasis, psychogenic, delirium and generalized deconditioning from recent COVID-19 infection.  Echo not revealing.  B12 high.  TSH low.  Free T4 slightly high.  CK normal. CT head without acute finding.  No apparent focal neuro deficit. -Continue tapering Decadron. -PT/OT/OOB-SNF  Acute metabolic encephalopathy in patient with dementia: Improving. Work-up not revealing except for mild elevated ammonia.  Mild leukocytosis but no other signs of infection.  No apparent focal neuro deficit.  -Treat treatable causes -Continue lactulose to 20 mg twice daily  Orthostatic hypotension: seems to have resolved based on lying down sitting BP but not able to perform standing BP.  Refusing TED hose. -Discontinued home amlodipine, Cardizem and labetalol -Apply TED hose-but refusing.  Paroxysmal A. Fib: Rate controlled. -Continue amiodarone and Eliquis.  Chronic diastolic CHF: Echo with EF of 65 to 70% and G1 DD.  Appears euvolemic. -Monitor fluid status.  Acute urinary retention: Reportedly had 1200 cc on I&O cath on presentation. Foley catheter inserted on 2/3 -Dr. Avanell Shackleton, urology-voiding trial in a week, and outpatient follow-up if she fails. -Discontinued home Myrbetriq -Bethanechol 25 mg 3 times daily  Low TSH/slightly elevated free T4: Likely due to amiodarone. -Recheck in 4 to 6 weeks outpatient.  Hypokalemia:  Resolved.  Generalized weakness/debility/physical deconditioning-partly psychogenic. -PT/OT-SNF -Antidepressants as below.  Depressed mood/fatigue -Zoloft 25 mg daily -Continue nightly trazodone  Pressure injury of skin Pressure Injury 08/14/19 Left Stage 1 -  Intact skin with non-blanchable redness of a localized area usually over a bony prominence. old red firm (Active)  08/14/19 0215  Location:   Location Orientation: Left  Staging: Stage 1 -  Intact skin with non-blanchable redness of a localized area usually over a bony prominence.  Wound Description (Comments): old red firm  Present on Admission: Yes               DVT prophylaxis: On Eliquis for atrial fibrillation Code Status: Full code Family Communication: Patient and/or RN.  Updated patient's daughter over the phone.  Discharge barrier: Medically stable for discharge pending safe disposition/SNF.  Patient is from: ALF Final disposition: Likely SNF.  Consultants: None   Microbiology summarized: None  Sch Meds:  Scheduled Meds: . acetaminophen  1,000 mg Oral BID  . amiodarone  200 mg Oral Daily  . apixaban  5 mg Oral BID  . vitamin C  500 mg Oral BID  . bethanechol  25 mg Oral TID  . Chlorhexidine Gluconate Cloth  6 each Topical Daily  . cholecalciferol  5,000 Units Oral Daily  . conjugated estrogens  1 Applicatorful Vaginal QODAY  . dexamethasone  4 mg Oral Daily   Followed by  . [START ON 08/21/2019] dexamethasone  2 mg Oral Daily   Followed by  . [START ON 08/25/2019] dexamethasone  1 mg Oral Daily  . ferrous sulfate  325 mg Oral Q breakfast  . insulin aspart  0-5 Units Subcutaneous QHS  . insulin aspart  0-9 Units Subcutaneous TID WC  . lactulose  20 g Oral BID  . magnesium chloride  64 mg Oral Daily  . Melatonin  3 mg Oral QHS  . multivitamin with minerals  1 tablet Oral BID  . pantoprazole  40 mg Oral Daily  . polyethylene glycol  17 g Oral Daily  . polyvinyl alcohol  1 drop Both Eyes BID   . prednisoLONE acetate  1 drop Right Eye Daily  . timolol  1 drop Both Eyes BID   Continuous Infusions:  PRN Meds:.acetaminophen **OR** acetaminophen, lidocaine, ondansetron **OR** ondansetron (ZOFRAN) IV, senna-docusate, traZODone  Antimicrobials: Anti-infectives (From admission, onward)   Start     Dose/Rate Route Frequency Ordered Stop   08/13/19 2359  acyclovir (ZOVIRAX) tablet 800 mg  Status:  Discontinued     800 mg Oral 2 times daily 08/13/19 2208 08/17/19 0814   08/13/19 2215  nitrofurantoin (macrocrystal-monohydrate) (MACROBID) capsule 100 mg  Status:  Discontinued     100 mg Oral Every 12 hours 08/13/19 2214 08/14/19 1114       I have personally reviewed the following labs and images: CBC: Recent Labs  Lab 08/13/19 1652 08/14/19 0400 08/16/19 0518 08/17/19 0336  WBC 11.2* 12.3* 10.9* 9.8  HGB 13.0 13.6 12.2 12.1  HCT 39.2 40.6 36.4 36.0  MCV 100.5* 98.1 96.8 96.0  PLT 217 196 188 191   BMP &GFR Recent Labs  Lab 08/13/19 1652 08/14/19 0400  08/16/19 0518 08/17/19 0336 08/18/19 0429  NA 139 139 138 141 139  K 4.3 3.5 3.6 3.3* 3.8  CL 107 104 99 100 102  CO2 21* 24 26 29 27   GLUCOSE 253* 154* 134* 109* 98  BUN 25* 17 14 12 15   CREATININE 0.72 0.35* 0.39* 0.37* 0.50  CALCIUM 8.4* 8.6* 8.8* 8.8* 8.7*  MG  --   --  1.8 2.1 1.9  PHOS  --   --  2.0* 2.6 2.1*   Estimated Creatinine Clearance: 46.8 mL/min (by C-G formula based on SCr of 0.5 mg/dL). Liver & Pancreas: Recent Labs  Lab 08/16/19 0518 08/17/19 0336 08/18/19 0429  ALBUMIN 2.6* 2.6* 2.6*   No results for input(s): LIPASE, AMYLASE in the last 168 hours. Recent Labs  Lab 08/15/19 1524  AMMONIA 60*   Diabetic: No results for input(s): HGBA1C in the last 72 hours. Recent Labs  Lab 08/17/19 1603 08/17/19 2116 08/18/19 0617 08/18/19 0803 08/18/19 1218  GLUCAP 201* 260* 101* 128* 170*   Cardiac Enzymes: Recent Labs  Lab 08/15/19 1524  CKTOTAL 27*   No results for input(s): PROBNP  in the last 8760 hours. Coagulation Profile: No results for input(s): INR, PROTIME in the last 168 hours. Thyroid Function Tests: Recent Labs    08/15/19 1524  FREET4 1.24*   Lipid Profile: No results for input(s): CHOL, HDL, LDLCALC, TRIG, CHOLHDL, LDLDIRECT in the last 72 hours. Anemia Panel: No results for input(s): VITAMINB12, FOLATE, FERRITIN, TIBC, IRON, RETICCTPCT in the last 72 hours. Urine analysis:    Component Value Date/Time   COLORURINE AMBER (A) 08/13/2019 2030   APPEARANCEUR CLEAR 08/13/2019 2030   LABSPEC 1.020 08/13/2019 2030   PHURINE 5.0 08/13/2019 2030   GLUCOSEU >=500 (A) 08/13/2019 2030   HGBUR NEGATIVE 08/13/2019 2030   BILIRUBINUR NEGATIVE 08/13/2019 2030   KETONESUR 5 (A) 08/13/2019 2030   PROTEINUR NEGATIVE 08/13/2019 2030   UROBILINOGEN 0.2 03/19/2015 1945   NITRITE NEGATIVE 08/13/2019 2030   LEUKOCYTESUR NEGATIVE 08/13/2019 2030   Sepsis Labs: Invalid input(s): PROCALCITONIN, LACTICIDVEN  Microbiology: No results found for this or any previous visit (from the past 240 hour(s)).  Radiology Studies: No results found.    Khandi Kernes T. Seila Liston [facility]  If 7PM-7AM, please contact night-coverage [email or web address] Password [email or web address] 08/18/2019, 12:23 PM

## 2019-08-18 NOTE — TOC Progression Note (Signed)
Transition of Care [facility]) - Progression Note    Patient Details  Name: Tracey Porter MRN: [id number] Date of Birth: 1929-11-22  Transition of Care [facility]) CM/SW Contact  Baldemar Lenis, [place] Phone Number: 08/18/2019, 1:07 PM  Clinical Narrative:   CSW following for discharge plan. Patient has no bed offers at this time, awaiting response from SNFs. CSW to follow.    Expected Discharge Plan: Skilled Nursing Facility Barriers to Discharge: Continued Medical Work up  Expected Discharge Plan and Services Expected Discharge Plan: Skilled Nursing Facility     Post Acute Care Choice: Home Health Living arrangements for the past 2 months: Assisted Living Facility                           HH Arranged: PT, OT, Speech Therapy HH Agency: [facility] Date [facility] Agency Contacted: 08/15/19 Time HH Agency Contacted: [phone] Representative spoke with at [facility] Agency: Rene Kocher   Social Determinants of Health (SDOH) Interventions    Readmission Risk Interventions No flowsheet data found.

## 2019-08-19 LAB — GLUCOSE, CAPILLARY
Glucose-Capillary: 113 mg/dL — ABNORMAL HIGH (ref 70–99)
Glucose-Capillary: 137 mg/dL — ABNORMAL HIGH (ref 70–99)
Glucose-Capillary: 184 mg/dL — ABNORMAL HIGH (ref 70–99)
Glucose-Capillary: 118 mg/dL — ABNORMAL HIGH (ref 70–99)

## 2019-08-19 NOTE — Progress Notes (Signed)
PROGRESS NOTE  Tracey Porter [id number]:[id number] DOB: 10-Apr-1930   PCP: Florentina Jenny, MD  Patient is from: ALF  DOA: 08/13/2019 LOS: 5  Brief Narrative / Interim history: 86 year old female with history of dementia, diet-controlled DM-2, HTN, paroxysmal A. fib on Eliquis and hospitalization for COVID-19 pneumonia 2 months ago presenting with generalized weakness and decreased oral intake in the setting of recent urinary tract infection.  She was admitted for generalized weakness and acute encephalopathy.  She also had orthostatic hypotension and acute urinary retention requiring indwelling Foley catheter.  Orthostatic hypotension resolved with IV fluid and adjustments of a cardiac medications.  However, she remained weak without focal neuro deficit.  She had extensive work-up but not revealing.  She was evaluated by PT/OT who recommended SNF.   In regards to occasional retention, urology consulted and recommended indwelling Foley catheter for 1 week before voiding trial.  Subjective: No major events overnight or this morning.  No complaints other than feeling tired.  Appears to be in better spirits today.  Denies pain anywhere.  Denies dyspnea, GI or UTI symptoms.  Oriented to self.  She thinks she is in rehab.   Objective: Vitals:   08/18/19 2309 08/19/19 0205 08/19/19 0341 08/19/19 1141  BP: 108/63  116/66 132/76  Pulse: 87  85 94  Resp: 17  18 18   Temp: 97.6 F (36.4 C)  (!) 97.5 F (36.4 C) 97.9 F (36.6 C)  TempSrc: Oral  Oral   SpO2: 96%  97% 99%  Weight:  68.3 kg    Height:        Intake/Output Summary (Last 24 hours) at 08/19/2019 1200 Last data filed at 08/19/2019 10/17/2019 Gross per 24 hour  Intake 200 ml  Output 1300 ml  Net -1100 ml   Filed Weights   08/17/19 0342 08/18/19 0306 08/19/19 0205  Weight: 68.4 kg 67.7 kg 68.3 kg    Examination:  GENERAL: No apparent distress.  Nontoxic. HEENT: MMM.  Vision and hearing grossly intact.  NECK: Supple.  No apparent JVD.    RESP:  No IWOB. Good air movement bilaterally. CVS:  RRR. Heart sounds normal.  ABD/GI/GU: Bowel sounds present. Soft. Non tender.  MSK/EXT:  Moves extremities. No apparent deformity. No edema.  SKIN: no apparent skin lesion or wound NEURO: Awake, alert and oriented self.  She thinks she is in rehab.  No apparent focal neuro deficit. PSYCH: Calm.  Appears to be in better spirits today.  Normal affect today.  Procedures:  None  Assessment & Plan: Generalized weakness: unclear etiology but multiple potential etiologies including UTI, chronic steroid, orthostasis, psychogenic, delirium and generalized deconditioning from recent COVID-19 infection.  Echo not revealing.  B12 high.  TSH low.  Free T4 slightly high.  CK normal. CT head without acute finding.  No apparent focal neuro deficit. -Continue tapering Decadron. -PT/OT/OOB-SNF  Acute metabolic encephalopathy in patient with dementia: Improving. Work-up not revealing except for mild elevated ammonia.  Mild leukocytosis but no other signs of infection.  No apparent focal neuro deficit.  -Treat treatable causes -Continue lactulose to 20 mg twice daily  Orthostatic hypotension: seems to have resolved based on lying down sitting BP but not able to perform standing BP.  Refusing TED hose. -Discontinued home amlodipine, Cardizem and labetalol -Apply TED hose-but refusing.  Paroxysmal A. Fib: Rate controlled. -Continue amiodarone and Eliquis.  Chronic diastolic CHF: Echo with EF of 65 to 70% and G1 DD.  Appears euvolemic. -Monitor fluid status.  Acute urinary retention: Reportedly  had 1200 cc on I&O cath on presentation. Foley catheter inserted on 2/3 -Dr. Linzie Collin, urology-voiding trial in a week, and outpatient follow-up if she fails. -Discontinued home Myrbetriq -Bethanechol 25 mg 3 times daily  Low TSH/slightly elevated free T4: Likely due to amiodarone. -Recheck in 4 to 6 weeks outpatient.  Hypokalemia: Resolved.  Generalized  weakness/debility/physical deconditioning-partly psychogenic. -PT/OT-SNF -Antidepressants as below.  Depressed mood/fatigue: Appears to be in better spirits today. -Started Zoloft 25 mg daily after discussion with patient's daughter. -Continue nightly trazodone  Pressure injury of skin Pressure Injury 08/14/19 Left Stage 1 -  Intact skin with non-blanchable redness of a localized area usually over a bony prominence. old red firm (Active)  08/14/19 0215  Location:   Location Orientation: Left  Staging: Stage 1 -  Intact skin with non-blanchable redness of a localized area usually over a bony prominence.  Wound Description (Comments): old red firm  Present on Admission: Yes               DVT prophylaxis: On Eliquis for atrial fibrillation Code Status: Full code Family Communication: Patient and/or RN.  Updated patient's daughter over the phone 2/6.  Discharge barrier: Medically stable for discharge pending safe disposition/SNF.  Patient is from: ALF Final disposition: Likely SNF when bed available.  Consultants: None   Microbiology summarized: None  Sch Meds:  Scheduled Meds: . acetaminophen  1,000 mg Oral BID  . amiodarone  200 mg Oral Daily  . apixaban  5 mg Oral BID  . vitamin C  500 mg Oral BID  . bethanechol  25 mg Oral TID  . Chlorhexidine Gluconate Cloth  6 each Topical Daily  . cholecalciferol  5,000 Units Oral Daily  . conjugated estrogens  1 Applicatorful Vaginal QODAY  . dexamethasone  4 mg Oral Daily   Followed by  . [START ON 08/21/2019] dexamethasone  2 mg Oral Daily   Followed by  . [START ON 08/25/2019] dexamethasone  1 mg Oral Daily  . ferrous sulfate  325 mg Oral Q breakfast  . insulin aspart  0-5 Units Subcutaneous QHS  . insulin aspart  0-9 Units Subcutaneous TID WC  . lactulose  20 g Oral BID  . magnesium chloride  64 mg Oral Daily  . Melatonin  3 mg Oral QHS  . multivitamin with minerals  1 tablet Oral BID  . pantoprazole  40 mg Oral Daily   . polyethylene glycol  17 g Oral Daily  . polyvinyl alcohol  1 drop Both Eyes BID  . prednisoLONE acetate  1 drop Right Eye Daily  . sertraline  25 mg Oral Daily  . timolol  1 drop Both Eyes BID   Continuous Infusions:  PRN Meds:.acetaminophen **OR** acetaminophen, lidocaine, ondansetron **OR** ondansetron (ZOFRAN) IV, senna-docusate, traZODone  Antimicrobials: Anti-infectives (From admission, onward)   Start     Dose/Rate Route Frequency Ordered Stop   08/13/19 2359  acyclovir (ZOVIRAX) tablet 800 mg  Status:  Discontinued     800 mg Oral 2 times daily 08/13/19 2208 08/17/19 0814   08/13/19 2215  nitrofurantoin (macrocrystal-monohydrate) (MACROBID) capsule 100 mg  Status:  Discontinued     100 mg Oral Every 12 hours 08/13/19 2214 08/14/19 1114       I have personally reviewed the following labs and images: CBC: Recent Labs  Lab 08/13/19 1652 08/14/19 0400 08/16/19 0518 08/17/19 0336  WBC 11.2* 12.3* 10.9* 9.8  HGB 13.0 13.6 12.2 12.1  HCT 39.2 40.6 36.4 36.0  MCV 100.5*  98.1 96.8 96.0  PLT 217 196 188 191   BMP &GFR Recent Labs  Lab 08/13/19 1652 08/14/19 0400 08/16/19 0518 08/17/19 0336 08/18/19 0429  NA 139 139 138 141 139  K 4.3 3.5 3.6 3.3* 3.8  CL 107 104 99 100 102  CO2 21* 24 26 29 27   GLUCOSE 253* 154* 134* 109* 98  BUN 25* 17 14 12 15   CREATININE 0.72 0.35* 0.39* 0.37* 0.50  CALCIUM 8.4* 8.6* 8.8* 8.8* 8.7*  MG  --   --  1.8 2.1 1.9  PHOS  --   --  2.0* 2.6 2.1*   Estimated Creatinine Clearance: 47 mL/min (by C-G formula based on SCr of 0.5 mg/dL). Liver & Pancreas: Recent Labs  Lab 08/16/19 0518 08/17/19 0336 08/18/19 0429  ALBUMIN 2.6* 2.6* 2.6*   No results for input(s): LIPASE, AMYLASE in the last 168 hours. Recent Labs  Lab 08/15/19 1524  AMMONIA 60*   Diabetic: No results for input(s): HGBA1C in the last 72 hours. Recent Labs  Lab 08/18/19 1218 08/18/19 1603 08/18/19 2109 08/19/19 0628 08/19/19 1148  GLUCAP 170* 217* 191*  113* 137*   Cardiac Enzymes: Recent Labs  Lab 08/15/19 1524  CKTOTAL 27*   No results for input(s): PROBNP in the last 8760 hours. Coagulation Profile: No results for input(s): INR, PROTIME in the last 168 hours. Thyroid Function Tests: No results for input(s): TSH, T4TOTAL, FREET4, T3FREE, THYROIDAB in the last 72 hours. Lipid Profile: No results for input(s): CHOL, HDL, LDLCALC, TRIG, CHOLHDL, LDLDIRECT in the last 72 hours. Anemia Panel: No results for input(s): VITAMINB12, FOLATE, FERRITIN, TIBC, IRON, RETICCTPCT in the last 72 hours. Urine analysis:    Component Value Date/Time   COLORURINE AMBER (A) 08/13/2019 2030   APPEARANCEUR CLEAR 08/13/2019 2030   [place] 1.020 08/13/2019 2030   PHURINE 5.0 08/13/2019 2030   GLUCOSEU >=500 (A) 08/13/2019 2030   [place] NEGATIVE 08/13/2019 2030   [place] NEGATIVE 08/13/2019 2030   KETONESUR 5 (A) 08/13/2019 2030   PROTEINUR NEGATIVE 08/13/2019 2030   UROBILINOGEN 0.2 03/19/2015 1945   NITRITE NEGATIVE 08/13/2019 2030   LEUKOCYTESUR NEGATIVE 08/13/2019 2030   Sepsis Labs: Invalid input(s): PROCALCITONIN, [place]  Microbiology: No results found for this or any previous visit (from the past 240 hour(s)).  Radiology Studies: No results found.    Noelani Harbach T. [place]  If 7PM-7AM, please contact night-coverage [email or web address] Password [email or web address] 08/19/2019, 12:00 PM

## 2019-08-19 NOTE — TOC Progression Note (Signed)
Transition of Care [facility]) - Progression Note    Patient Details  Name: Tracey Porter MRN: [id number] Date of Birth: [birth year]  Transition of Care [facility]) CM/SW Contact  Nada Boozer Athony Coppa, LCSWA Phone Number: 08/19/2019, 10:11 AM  Clinical Narrative:     Patient currently doesn't have any bed offers. CSW has re-faxed her out to SNF's in the area. CSW will continue to follow and assist with disposition planning.   Expected Discharge Plan: Skilled Nursing Facility Barriers to Discharge: Continued Medical Work up  Expected Discharge Plan and Services Expected Discharge Plan: Skilled Nursing Facility     Post Acute Care Choice: Home Health Living arrangements for the past 2 months: Assisted Living Facility                           HH Arranged: PT, OT, Speech Therapy HH Agency: [facility] Date [facility] Agency Contacted: 08/15/19 Time HH Agency Contacted: [phone] Representative spoke with at [facility] Agency: Rene Kocher   Social Determinants of Health (SDOH) Interventions    Readmission Risk Interventions No flowsheet data found.

## 2019-08-20 DIAGNOSIS — F0390 Unspecified dementia without behavioral disturbance: Secondary | ICD-10-CM

## 2019-08-20 LAB — GLUCOSE, CAPILLARY
Glucose-Capillary: 88 mg/dL (ref 70–99)
Glucose-Capillary: 151 mg/dL — ABNORMAL HIGH (ref 70–99)
Glucose-Capillary: 159 mg/dL — ABNORMAL HIGH (ref 70–99)
Glucose-Capillary: 130 mg/dL — ABNORMAL HIGH (ref 70–99)

## 2019-08-20 LAB — SARS CORONAVIRUS 2 (TAT 6-24 HRS): SARS Coronavirus 2: NEGATIVE

## 2019-08-20 MED ORDER — METOPROLOL TARTRATE 25 MG PO TABS
25.0000 mg | ORAL_TABLET | Freq: Two times a day (BID) | ORAL | Status: DC
  Administered 2019-08-20 – 2019-08-21 (×2): 25 mg via ORAL
  Filled 2019-08-20 (×3): qty 1

## 2019-08-20 MED ORDER — SERTRALINE HCL 25 MG PO TABS: 25.0000 mg | ORAL_TABLET | Freq: Every day | ORAL | 0 refills | Status: DC

## 2019-08-20 MED ORDER — DEXAMETHASONE 1 MG PO TABS: ORAL_TABLET | ORAL | 0 refills | Status: DC

## 2019-08-20 NOTE — TOC Progression Note (Signed)
Transition of Care [facility]) - Progression Note    Patient Details  Name: Lonisha Bobby MRN: [id number] Date of Birth: 11/10/1929  Transition of Care [facility]) CM/SW Contact  Baldemar Lenis, [place] Phone Number: 08/20/2019, 11:53 AM  Clinical Narrative:   CSW received call back from [employer] to discuss choice. Victorino Dike asked about [place] and [place], and if they are unavailable then they would like to choose [facility]. CSW reached out to [place], and they have no beds available; [place] doesn't know when they will have a bed available and [place] will not have a bed available until late this week. [facility] is able to accept the patient, pending COVID test. CSW to follow.    Expected Discharge Plan: Skilled Nursing Facility Barriers to Discharge: Continued Medical Work up  Expected Discharge Plan and Services Expected Discharge Plan: Skilled Nursing Facility     Post Acute Care Choice: Home Health Living arrangements for the past 2 months: Assisted Living Facility                           HH Arranged: PT, OT, Speech Therapy HH Agency: [facility] Date [facility] Agency Contacted: 08/15/19 Time HH Agency Contacted: [phone] Representative spoke with at [facility] Agency: Rene Kocher   Social Determinants of Health (SDOH) Interventions    Readmission Risk Interventions No flowsheet data found.

## 2019-08-20 NOTE — Discharge Summary (Signed)
Physician Discharge Summary  Tracey PingConstance Porter WUJ:[id number] DOB: 10/22/1929 DOA: 08/13/2019  PCP: [name], Henry, MD  Admit date: 08/13/2019 Discharge date: 08/20/2019  Admitted From: ALF Disposition: SNF with hospice follow-up  Recommendations for Outpatient Follow-up:  1. Follow ups as below. 2. Please obtain CBC/BMP/Mag at follow up 3. Please follow up on the following pending results: None  Home Health: Discharged to SNF with hospice Equipment/Devices: None  Discharge Condition: Stable CODE STATUS: Full code  Follow-up Information    [facility]. Schedule an appointment as soon as possible for a visit in 2 week(s).   Why: if she fails voiding trial in a week Contact information: [address] [place] [id number] [phone]          Hospital Course: 86 year old female with history of dementia, diet-controlled DM-2, HTN, paroxysmal A. fib on Eliquis and hospitalization for COVID-19 pneumonia 2 months ago.  Patient was discharged on dexamethasone 6 mg for 4 more days but continued on base at ALF.   Patient presented with generalized weakness and decreased oral intake in the setting of recent urinary tract infection and prolonged steroid course.  She was admitted for generalized weakness, acute encephalopathy, orthostatic hypotension and acute urinary retention.   Orthostatic hypotension resolved with IV fluid and adjustments of a cardiac medications.  She has been started on dexamethasone taper.  However, she remained weak without focal neuro deficit.  She had extensive work-up but not revealing.  She was also started on low-dose Zoloft out of concern for underlying psychiatric contributing to her lack of motivation and weakness. She was evaluated by PT/OT who recommended SNF.   In regards to acute urinary retention, indwelling Foley catheter placed on 08/15/2019.  Urology, Dr. Linzie CollinHerick recommended indwelling Foley catheter for 1 week before  voiding trial.  If she fails voiding trial in 1 week, urology recommended replacing indwelling Foley and outpatient follow-up.  See individual problem list below for more on hospital course.  Discharge Diagnoses:  Generalized weakness/acute metabolic encephalopathy in patient with dementia: unclear etiology but multiple potential causes including recent UTI, iatrogenic (prolonged steroid and cardiac meds), orthostasis, depression, delirium and generalized deconditioning from recent COVID-19 infection.  Work-up including echocardiogram, B12, thyroid panel, CK, and CT head not revealing.  Ammonia was slightly elevated but resolved.  Has no apparent neuro deficit.  Patient is alert and awake but only oriented to self.  She always thinks she is at rehab. -Adjusted cardiac meds.  Orthostatic hypotension resolved. -Continue tapering Decadron-she will be on 2 mg daily for 4 days followed by 1 mg daily for 3 days to complete taper -Continue PT/OT therapy daily at SNF -Started on low-dose Zoloft for possible undiagnosed depression.  Orthostatic hypotension: Resolved after discontinuing home amlodipine, Cardizem and labetalol -Apply TED hose-if she agrees.  Paroxysmal A. Fib: Rate controlled. -Continue amiodarone and Eliquis.  Chronic diastolic CHF: Echo with EF of 65 to 70% and G1 DD.  Appears euvolemic.  Not on diuretics. -Monitor fluid status. -Liberate diet given advanced dementia.  Acute urinary retention: Reportedly had 1200 cc on I&O cath on presentation. Foley catheter inserted on 2/3 -Dr. Linzie CollinHerick, urology-voiding trial in a week, and outpatient follow-up if she fails. -Discontinued home Myrbetriq  Low TSH/slightly elevated free T4: Likely due to amiodarone. -Recheck in 4 to 6 weeks outpatient.  Hypokalemia: Resolved.  Generalized weakness/debility/physical deconditioning-partly psychogenic. -PT/OT-SNF -Antidepressants as below.  Depressed mood/fatigue: Appears to be in better  spirits today. -Started Zoloft 25 mg daily  after discussion with patient's daughter. -Continue nightly trazodone  Pressure injury of skin Pressure Injury 08/14/19 Left Stage 1 -  Intact skin with non-blanchable redness of a localized area usually over a bony prominence. old red firm (Active)  08/14/19 0215  Location:   Location Orientation: Left  Staging: Stage 1 -  Intact skin with non-blanchable redness of a localized area usually over a bony prominence.  Wound Description (Comments): old red firm  Present on Admission: Yes       Discharge Instructions  Discharge Instructions    Diet general   Complete by: As directed    Increase activity slowly   Complete by: As directed      Allergies as of 08/20/2019      Reactions   Penicillins Rash   Did it involve swelling of the face/tongue/throat, SOB, or low BP? Unknown Did it involve sudden or severe rash/hives, skin peeling, or any reaction on the inside of your mouth or nose? Unknown Did you need to seek medical attention at a hospital or doctor's office? Unknown When did it last happen? If all above answers are "NO", may proceed with cephalosporin use.      Medication List    STOP taking these medications   acyclovir 800 MG tablet Commonly known as: ZOVIRAX   amLODipine 5 MG tablet Commonly known as: NORVASC   diltiazem 120 MG 24 hr capsule Commonly known as: CARDIZEM CD   labetalol 100 MG tablet Commonly known as: NORMODYNE   Myrbetriq 25 MG Tb24 tablet Generic drug: mirabegron ER     TAKE these medications   acetaminophen 500 MG tablet Commonly known as: TYLENOL Take 1,000 mg by mouth See admin instructions. 1000 mg twice a day and 500 mg every 4 hours as needed for pain/headache   amiodarone 200 MG tablet Commonly known as: PACERONE Take 200 mg by mouth daily.   apixaban 5 MG Tabs tablet Commonly known as: ELIQUIS Take 1 tablet (5 mg total) by mouth 2 (two) times daily. Please cancel previous  rx's sent; call pt with price before filling   conjugated estrogens vaginal cream Commonly known as: PREMARIN Place 1 Applicatorful vaginally every other day.   Culturelle Kids Chew Chew 1 tablet by mouth daily.   dexamethasone 1 MG tablet Commonly known as: DECADRON Take 2 tablets (2 mg total) by mouth daily for 4 days, THEN 1 tablet (1 mg total) daily for 3 days. Start taking on: August 21, 2019   ferrous sulfate 325 (65 FE) MG tablet Take 325 mg by mouth daily with breakfast.   ICaps Plus Tabs Take 1 tablet by mouth 2 (two) times daily.   lidocaine 5 % Commonly known as: LIDODERM Place 1 patch onto the skin as needed (for pain).   loperamide 2 MG capsule Commonly known as: IMODIUM Take 1 capsule (2 mg total) by mouth as needed for diarrhea or loose stools.   Melatonin 3 MG Tabs Take 1 tablet (3 mg total) by mouth at bedtime.   nitroGLYCERIN 0.4 MG SL tablet Commonly known as: NITROSTAT Place 0.4 mg under the tongue every 5 (five) minutes as needed for chest pain.   ondansetron 8 MG tablet Commonly known as: ZOFRAN Take 8 mg by mouth 2 (two) times daily as needed for nausea or vomiting.   pantoprazole 40 MG tablet Commonly known as: PROTONIX Take 40 mg by mouth daily.   polyethylene glycol 17 g packet Commonly known as: MIRALAX / GLYCOLAX Take 17 g by mouth  daily.   prednisoLONE acetate 1 % ophthalmic suspension Commonly known as: PRED FORTE Place 1 drop into the right eye daily.   senna-docusate 8.6-50 MG tablet Commonly known as: Senokot-S Take 1 tablet by mouth daily as needed for mild constipation.   sertraline 25 MG tablet Commonly known as: ZOLOFT Take 1 tablet (25 mg total) by mouth daily. Start taking on: August 21, 2019   Slow-Mag 64 MG Tbec SR tablet Generic drug: magnesium chloride Take 64 mg by mouth daily.   Systane 0.4-0.3 % Soln Generic drug: Polyethyl Glycol-Propyl Glycol Apply 1 drop to eye 2 (two) times daily.   timolol 0.5 %  ophthalmic solution Commonly known as: BETIMOL Place 1 drop into both eyes 2 (two) times daily.   traZODone 50 MG tablet Commonly known as: DESYREL Take 50 mg by mouth at bedtime as needed for sleep.   vitamin B-12 1000 MCG tablet Commonly known as: CYANOCOBALAMIN Take 1,000 mcg by mouth daily.   vitamin C 500 MG tablet Commonly known as: ASCORBIC ACID Take 500 mg by mouth 2 (two) times daily.   Vitamin D 125 MCG (5000 UT) Caps Take 5,000 Units by mouth daily.       Consultations:  Urology over the phone  Procedures/Studies:  2D Echo on 08/14/2019 1. Left ventricular ejection fraction, by visual estimation, is 65 to  70%. The left ventricle has normal function. Left ventricular septal wall  thickness was severely increased. Severely increased left ventricular  posterior wall thickness. Inadequate  for regional wall motion assessment as patient requested study to be  terminated and no apical 2 or 3 chamber view obtained.  2. Left ventricular diastolic parameters are consistent with Grade I  diastolic dysfunction (impaired relaxation).  3. Global right ventricle has normal systolic function.The right  ventricular size is normal. No increase in right ventricular wall  thickness.  4. Left atrial size was mildly dilated.  5. Right atrial size was normal.  6. Moderate calcification of the mitral valve leaflet(s). Moderate  thickening of the mitral valve leaflet(s). Severe mitral annular  calcification. Mild mitral valve regurgitation. No evidence of mitral  stenosis.  7. The tricuspid valve is normal in structure. Tricuspid valve  regurgitation is not demonstrated.  8. The aortic valve is tricuspid. There is severe calcifcation of the  aortic valve. There is severe thickening of the aortic valve. Aortic valve  regurgitation is not visualized. No evidence of aortic valve sclerosis or  stenosis.  9. The pulmonic valve was normal in structure. Pulmonic valve    regurgitation is mild.  10. Cannot assess PASP due to IVC not visualized.    CT HEAD WO CONTRAST  Result Date: 08/13/2019 CLINICAL DATA:  Altered mental status. EXAM: CT HEAD WITHOUT CONTRAST TECHNIQUE: Contiguous axial images were obtained from the base of the skull through the vertex without intravenous contrast. COMPARISON:  August 05, 2019 FINDINGS: Brain: There is mild to moderate severity cerebral atrophy with widening of the extra-axial spaces and ventricular dilatation. There are areas of decreased attenuation within the white matter tracts of the supratentorial brain, consistent with microvascular disease changes. Vascular: No hyperdense vessel or unexpected calcification. Skull: Normal. Negative for fracture or focal lesion. Sinuses/Orbits: No acute finding. Other: None. IMPRESSION: 1. Generalized cerebral atrophy. 2. No acute intracranial abnormality. Electronically Signed   By: Aram Candela M.D.   On: 08/13/2019 18:46   CT Head Wo Contrast  Result Date: 08/05/2019 CLINICAL DATA:  Fall EXAM: CT HEAD WITHOUT CONTRAST CT CERVICAL  SPINE WITHOUT CONTRAST TECHNIQUE: Multidetector CT imaging of the head and cervical spine was performed following the standard protocol without intravenous contrast. Multiplanar CT image reconstructions of the cervical spine were also generated. COMPARISON:  CT head dated 04/30/2018 FINDINGS: CT HEAD FINDINGS Brain: No evidence of acute infarction, hemorrhage, hydrocephalus, extra-axial collection or mass lesion/mass effect. Mild cortical atrophy. Subcortical white matter and periventricular small vessel ischemic changes. Vascular: Intracranial atherosclerosis. Skull: Normal. Negative for fracture or focal lesion. Sinuses/Orbits: The visualized paranasal sinuses are essentially clear. The mastoid air cells are unopacified. Other: None. CT CERVICAL SPINE FINDINGS Alignment: Normal cervical lordosis. Skull base and vertebrae: No acute fracture. No primary bone lesion  or focal pathologic process. Soft tissues and spinal canal: No prevertebral fluid or swelling. No visible canal hematoma. Disc levels: Mild degenerative changes of the mid/lower cervical spine. Spinal canal is patent. Upper chest: Visualized lung apices are clear. Other: Visualized right thyroid is mildly heterogeneous. IMPRESSION: No evidence of acute intracranial abnormality. Atrophy with small vessel ischemic changes. No evidence of traumatic injury to the cervical spine. Mild degenerative changes. Electronically Signed   By: Charline Bills M.D.   On: 08/05/2019 05:59   CT Cervical Spine Wo Contrast  Result Date: 08/05/2019 CLINICAL DATA:  Fall EXAM: CT HEAD WITHOUT CONTRAST CT CERVICAL SPINE WITHOUT CONTRAST TECHNIQUE: Multidetector CT imaging of the head and cervical spine was performed following the standard protocol without intravenous contrast. Multiplanar CT image reconstructions of the cervical spine were also generated. COMPARISON:  CT head dated 04/30/2018 FINDINGS: CT HEAD FINDINGS Brain: No evidence of acute infarction, hemorrhage, hydrocephalus, extra-axial collection or mass lesion/mass effect. Mild cortical atrophy. Subcortical white matter and periventricular small vessel ischemic changes. Vascular: Intracranial atherosclerosis. Skull: Normal. Negative for fracture or focal lesion. Sinuses/Orbits: The visualized paranasal sinuses are essentially clear. The mastoid air cells are unopacified. Other: None. CT CERVICAL SPINE FINDINGS Alignment: Normal cervical lordosis. Skull base and vertebrae: No acute fracture. No primary bone lesion or focal pathologic process. Soft tissues and spinal canal: No prevertebral fluid or swelling. No visible canal hematoma. Disc levels: Mild degenerative changes of the mid/lower cervical spine. Spinal canal is patent. Upper chest: Visualized lung apices are clear. Other: Visualized right thyroid is mildly heterogeneous. IMPRESSION: No evidence of acute  intracranial abnormality. Atrophy with small vessel ischemic changes. No evidence of traumatic injury to the cervical spine. Mild degenerative changes. Electronically Signed   By: Charline Bills M.D.   On: 08/05/2019 05:59   DG Chest Portable 1 View  Result Date: 08/13/2019 CLINICAL DATA:  Generalized weakness for 2 days. EXAM: PORTABLE CHEST 1 VIEW COMPARISON:  June 22, 2019 FINDINGS: Mild, chronic appearing increased interstitial lung markings are seen which are stable in severity. Mild, stable linear scarring and/or atelectasis is seen within the bilateral lung bases. There is no evidence of a pleural effusion or pneumothorax. The heart size and mediastinal contours are within normal limits. There is marked severity calcification of the thoracic aorta. Multilevel degenerative changes seen throughout the thoracic spine. IMPRESSION: 1. Chronic appearing increased interstitial lung markings with mild, stable bibasilar linear scarring and/or atelectasis. Electronically Signed   By: Aram Candela M.D.   On: 08/13/2019 18:02   ECHOCARDIOGRAM COMPLETE  Result Date: 08/14/2019   ECHOCARDIOGRAM REPORT   Patient Name:   Tracey Porter Date of Exam: 08/14/2019 Medical Rec #:  [id number]         Height:       64.0 in Accession #:    [id number]  Weight:       140.0 lb Date of Birth:  12/31/1929          BSA:          1.68 m Patient Age:    86 years          BP:           122/75 mmHg Patient Gender: F                 HR:           97 bpm. Exam Location:  Inpatient Procedure: 2D Echo Indications:    Abnormal ECG 794.31/R94.31  History:        Patient has prior history of Echocardiogram examinations, most                 recent 05/12/2017. Aortic Valve Disease, Arrythmias:Atrial                 Fibrillation; Risk Factors:Diabetes and Hypertension.  Sonographer:    Ross LudwigArthur Guy RDCS (AE) Referring Phys: [phone] JARED M GARDNER  Sonographer Comments: Technically difficult study due to poor echo windows. Image  acquisition challenging due to uncooperative patient. Attempted to continue test three times after patient said to stop. When asked if she wanted to stop the test, she said  go ahead. After last attempt patient requested that I stop the test due to probe pressure making it hard for her to breath. Test stopped at that point. IMPRESSIONS  1. Left ventricular ejection fraction, by visual estimation, is 65 to 70%. The left ventricle has normal function. Left ventricular septal wall thickness was severely increased. Severely increased left ventricular posterior wall thickness. Inadequate for regional wall motion assessment as patient requested study to be terminated and no apical 2 or 3 chamber view obtained.  2. Left ventricular diastolic parameters are consistent with Grade I diastolic dysfunction (impaired relaxation).  3. Global right ventricle has normal systolic function.The right ventricular size is normal. No increase in right ventricular wall thickness.  4. Left atrial size was mildly dilated.  5. Right atrial size was normal.  6. Moderate calcification of the mitral valve leaflet(s). Moderate thickening of the mitral valve leaflet(s). Severe mitral annular calcification. Mild mitral valve regurgitation. No evidence of mitral stenosis.  7. The tricuspid valve is normal in structure. Tricuspid valve regurgitation is not demonstrated.  8. The aortic valve is tricuspid. There is severe calcifcation of the aortic valve. There is severe thickening of the aortic valve. Aortic valve regurgitation is not visualized. No evidence of aortic valve sclerosis or stenosis.  9. The pulmonic valve was normal in structure. Pulmonic valve regurgitation is mild. 10. Cannot assess PASP due to IVC not visualized. FINDINGS  Left Ventricle: Left ventricular ejection fraction, by visual estimation, is 65 to 70%. The left ventricle has normal function. The left ventricle is not well visualized. Severely increased left ventricular  posterior wall thickness. There is no left ventricular hypertrophy. The left ventricular diastology could not be evaluated due to mitral annular calcification (moderate or greater). Left ventricular diastolic parameters are consistent with Grade I diastolic dysfunction (impaired relaxation). Indeterminate filling pressures. Right Ventricle: The right ventricular size is normal. No increase in right ventricular wall thickness. Global RV systolic function is has normal systolic function. Cannot assess PASP due to IVC not visualized. Left Atrium: Left atrial size was mildly dilated. Right Atrium: Right atrial size was normal in size Pericardium: There is no evidence of pericardial effusion. Mitral Valve:  The mitral valve is normal in structure. There is moderate thickening of the mitral valve leaflet(s). There is moderate calcification of the mitral valve leaflet(s). Severe mitral annular calcification. Mild mitral valve regurgitation. No evidence of mitral valve stenosis by observation. Tricuspid Valve: The tricuspid valve is normal in structure. Tricuspid valve regurgitation is not demonstrated. Aortic Valve: The aortic valve is tricuspid. . There is severe thickening and severe calcifcation of the aortic valve. Aortic valve regurgitation is not visualized. The aortic valve is structurally normal, with no evidence of sclerosis or stenosis. Moderate to severe aortic valve annular calcification. There is severe thickening of the aortic valve. There is severe calcifcation of the aortic valve. Aortic valve mean gradient measures 7.6 mmHg. Aortic valve peak gradient measures 13.0 mmHg. Pulmonic Valve: The pulmonic valve was normal in structure. Pulmonic valve regurgitation is mild. Pulmonic regurgitation is mild. Aorta: The aortic root, ascending aorta and aortic arch are all structurally normal, with no evidence of dilitation or obstruction. Venous: The inferior vena cava was not well visualized. IAS/Shunts: No atrial  level shunt detected by color flow Doppler. There is no evidence of a patent foramen ovale. No ventricular septal defect is seen or detected. There is no evidence of an atrial septal defect.  LEFT VENTRICLE PLAX 2D LVIDd:         2.46 cm LVIDs:         2.01 cm LV PW:         1.73 cm LV IVS:        2.20 cm LV SV:         9 ml LV SV Index:   5.02  LEFT ATRIUM           Index LA diam:      3.70 cm 2.20 cm/m LA Vol (A4C): 61.9 ml 36.82 ml/m  AORTIC VALVE AV Vmax:           180.20 cm/s AV Vmean:          130.000 cm/s AV VTI:            0.257 m AV Peak Grad:      13.0 mmHg AV Mean Grad:      7.6 mmHg LVOT Vmax:         103.80 cm/s LVOT Vmean:        73.600 cm/s LVOT VTI:          0.170 m LVOT/AV VTI ratio: 0.66  AORTA Ao Root diam: 3.00 cm Ao Asc diam:  3.70 cm MITRAL VALVE                         TRICUSPID VALVE MV Area (PHT): 3.74 cm              TR Peak grad:   23.4 mmHg MV PHT:        58.87 msec            TR Vmax:        246.00 cm/s MV Decel Time: 203 msec MV E velocity: 63.60 cm/s  103 cm/s  SHUNTS MV A velocity: 105.00 cm/s 70.3 cm/s Systemic VTI: 0.17 m MV E/A ratio:  0.61        1.5  Armanda Magic MD Electronically signed by Armanda Magic MD Signature Date/Time: 08/14/2019/10:54:52 AM    Final    DG Hip Unilat W or Wo Pelvis 2-3 Views Right  Result Date: 08/05/2019 CLINICAL DATA:  Fall out of bed  EXAM: DG HIP (WITH OR WITHOUT PELVIS) 2-3V RIGHT COMPARISON:  CT abdomen/pelvis dated 06/23/2019 FINDINGS: Right hip arthroplasty, without evidence of complication. Old bilateral superior and inferior pubic rami fractures, right greater than left. Visualized bony pelvis is otherwise intact. Degenerative changes the lower lumbar spine. IMPRESSION: Right hip arthroplasty, without evidence of complication. Bilateral pelvic ring fractures, chronic. Electronically Signed   By: Charline Bills M.D.   On: 08/05/2019 05:40      Discharge Exam: Vitals:   08/20/19 0833 08/20/19 1208  BP: 113/67 (!) 101/50  Pulse: 93  93  Resp:  16  Temp: 97.9 F (36.6 C) 98.4 F (36.9 C)  SpO2: 96% 98%    GENERAL: No acute distress.  Appears well.  HEENT: MMM.  Vision and hearing grossly intact.  NECK: Supple.  No apparent JVD.  RESP:  No IWOB. Good air movement bilaterally. CVS:  RRR. Heart sounds normal.  ABD/GI/GU: Bowel sounds present. Soft. Non tender.  MSK/EXT:  Moves extremities. No apparent deformity or edema.  SKIN: no apparent skin lesion or wound NEURO: Awake, alert and oriented self.  She thinks she is at rehab.  No apparent focal neuro deficit. PSYCH: Calm. Normal affect.   The results of significant diagnostics from this hospitalization (including imaging, microbiology, ancillary and laboratory) are listed below for reference.     Microbiology: No results found for this or any previous visit (from the past 240 hour(s)).   Labs: BNP (last 3 results) Recent Labs    08/15/19 1524  BNP 201.0*   Basic Metabolic Panel: Recent Labs  Lab 08/13/19 1652 08/14/19 0400 08/16/19 0518 08/17/19 0336 08/18/19 0429  NA 139 139 138 141 139  K 4.3 3.5 3.6 3.3* 3.8  CL 107 104 99 100 102  CO2 21* 24 26 29 27   GLUCOSE 253* 154* 134* 109* 98  BUN 25* 17 14 12 15   CREATININE 0.72 0.35* 0.39* 0.37* 0.50  CALCIUM 8.4* 8.6* 8.8* 8.8* 8.7*  MG  --   --  1.8 2.1 1.9  PHOS  --   --  2.0* 2.6 2.1*   Liver Function Tests: Recent Labs  Lab 08/16/19 0518 08/17/19 0336 08/18/19 0429  ALBUMIN 2.6* 2.6* 2.6*   No results for input(s): LIPASE, AMYLASE in the last 168 hours. Recent Labs  Lab 08/15/19 1524  AMMONIA 60*   CBC: Recent Labs  Lab 08/13/19 1652 08/14/19 0400 08/16/19 0518 08/17/19 0336  WBC 11.2* 12.3* 10.9* 9.8  HGB 13.0 13.6 12.2 12.1  HCT 39.2 40.6 36.4 36.0  MCV 100.5* 98.1 96.8 96.0  PLT 217 196 188 191   Cardiac Enzymes: Recent Labs  Lab 08/15/19 1524  CKTOTAL 27*   BNP: Invalid input(s): POCBNP CBG: Recent Labs  Lab 08/19/19 1148 08/19/19 1551 08/19/19 2122  08/20/19 0614 08/20/19 1220  GLUCAP 137* 184* 118* 88 151*   D-Dimer No results for input(s): DDIMER in the last 72 hours. Hgb A1c No results for input(s): HGBA1C in the last 72 hours. Lipid Profile No results for input(s): CHOL, HDL, LDLCALC, TRIG, CHOLHDL, LDLDIRECT in the last 72 hours. Thyroid function studies No results for input(s): TSH, T4TOTAL, T3FREE, THYROIDAB in the last 72 hours.  Invalid input(s): FREET3 Anemia work up No results for input(s): VITAMINB12, FOLATE, FERRITIN, TIBC, IRON, RETICCTPCT in the last 72 hours. Urinalysis    Component Value Date/Time   COLORURINE AMBER (A) 08/13/2019 2030   APPEARANCEUR CLEAR 08/13/2019 2030   LABSPEC 1.020 08/13/2019 2030   PHURINE 5.0 08/13/2019  2030   GLUCOSEU >=500 (A) 08/13/2019 2030   HGBUR NEGATIVE 08/13/2019 2030   BILIRUBINUR NEGATIVE 08/13/2019 2030   KETONESUR 5 (A) 08/13/2019 2030   PROTEINUR NEGATIVE 08/13/2019 2030   UROBILINOGEN 0.2 03/19/2015 1945   NITRITE NEGATIVE 08/13/2019 2030   LEUKOCYTESUR NEGATIVE 08/13/2019 2030   Sepsis Labs Invalid input(s): PROCALCITONIN,  WBC,  LACTICIDVEN   Time coordinating discharge: 35 minutes  SIGNED:  Almon Hercules, MD  [facility] 08/20/2019, 1:53 PM  If 7PM-7AM, please contact night-coverage [email or web address] Password [email or web address]

## 2019-08-20 NOTE — TOC Progression Note (Signed)
Transition of Care [facility]) - Progression Note    Patient Details  Name: Tracey Porter MRN: [id number] Date of Birth: May 01, 1930  Transition of Care [facility]) CM/SW Contact  Baldemar Lenis, [place] Phone Number: 08/20/2019, 10:53 AM  Clinical Narrative:   Patient has bed offers at [facility] and [facility] for SNF. CSW spoke with daughter, Tracey Porter, to provide bed offers and she would like to discuss with her sister in [place] before making choice. Tracey Porter asked for time to talk to her sister and then will call CSW back. CSW to follow.    Expected Discharge Plan: Skilled Nursing Facility Barriers to Discharge: Continued Medical Work up  Expected Discharge Plan and Services Expected Discharge Plan: Skilled Nursing Facility     Post Acute Care Choice: Home Health Living arrangements for the past 2 months: Assisted Living Facility                           HH Arranged: PT, OT, Speech Therapy HH Agency: [facility] Date [facility] Agency Contacted: 08/15/19 Time HH Agency Contacted: 1019 Representative spoke with at [facility] Agency: Rene Kocher   Social Determinants of Health (SDOH) Interventions    Readmission Risk Interventions No flowsheet data found.

## 2019-08-20 NOTE — Progress Notes (Signed)
Physical Therapy Treatment Patient Details Name: Tracey Porter MRN: [id number] DOB: 1930-01-28 Today's Date: 08/20/2019    History of Present Illness 86yo female c/o generalized weakness, reduced oral intake, and found to have UTI in ED. Also found to have orthostatic hypotension and acute encepthalopathy with further workup. PMH HTN, DM, R THA, dementia, A-fib on Eliquis, Covid 06/2019    PT Comments    Pt able to stand with PT and tech today however due to agitation, dementia, and impulsivity pt quickly returned her self to sitting and then to supine in the bed. Pt continues to require max encouragement to participate in mobility and is slowly progressing towards goals. Cont to recommend SNF Upon d/c for increased time to achieve safe supervision level of care to return to ALF. Acute PT to cont to follow.    Follow Up Recommendations  SNF     Equipment Recommendations       Recommendations for Other Services       Precautions / Restrictions Precautions Precautions: Fall;Other (comment) Precaution Comments: dementia, perseverating on laying back down Restrictions Weight Bearing Restrictions: No    Mobility  Bed Mobility Overal bed mobility: Needs Assistance Bed Mobility: Supine to Sit;Sit to Supine     Supine to sit: Max assist Sit to supine: Max assist   General bed mobility comments: max directional verbal cues, pt initiating movement but ultimately required maxA to bring LEs off EOB and for trunk elevation, pt impulsively returning to supine  Transfers Overall transfer level: Needs assistance Equipment used: Rolling walker (2 wheeled) Transfers: Sit to/from Stand Sit to Stand: Mod assist;+2 physical assistance         General transfer comment: pt agreed to standing up one time, pt able to clear bottom today however with significant trunk flexion and unable to achieve trunk extension despite max verbal and tactile cues, pt immediately trying to sit back  down  Ambulation/Gait             General Gait Details: refused   Stairs             Wheelchair Mobility    Modified Rankin (Stroke Patients Only)       Balance Overall balance assessment: Needs assistance Sitting-balance support: Bilateral upper extremity supported Sitting balance-Leahy Scale: Fair Sitting balance - Comments: pt able to maintain EOB balance with close supervision   Standing balance support: Bilateral upper extremity supported Standing balance-Leahy Scale: Zero Standing balance comment: dependent on bilat UEs and physical assist                            Cognition Arousal/Alertness: Awake/alert Behavior During Therapy: Flat affect Overall Cognitive Status: History of cognitive impairments - at baseline                                 General Comments: h/o dementia, no comprehension of therapy, perseverated on returning to laying down and impulsively trying to return to supine      Exercises      General Comments General comments (skin integrity, edema, etc.): pt with noted redness on her bottom, rolled onto R side for pressure relief      Pertinent Vitals/Pain Pain Assessment: Faces Faces Pain Scale: Hurts a little bit Pain Location: R heel/calf to touch Pain Descriptors / Indicators: Grimacing Pain Intervention(s): Monitored during session    Home Living  Prior Function            PT Goals (current goals can now be found in the care plan section) Acute Rehab PT Goals Patient Stated Goal: "I want to lay back down" Progress towards PT goals: Progressing toward goals    Frequency    Min 3X/week      PT Plan Current plan remains appropriate    Co-evaluation              AM-PAC PT "6 Clicks" Mobility   Outcome Measure  Help needed turning from your back to your side while in a flat bed without using bedrails?: A Little Help needed moving from lying on your  back to sitting on the side of a flat bed without using bedrails?: A Little Help needed moving to and from a bed to a chair (including a wheelchair)?: A Little Help needed standing up from a chair using your arms (e.g., wheelchair or bedside chair)?: A Lot Help needed to walk in hospital room?: A Lot Help needed climbing 3-5 steps with a railing? : Total 6 Click Score: 14    End of Session Equipment Utilized During Treatment: Gait belt Activity Tolerance: Patient limited by fatigue(due to impaired cognition) Patient left: in bed;with call bell/phone within reach;with bed alarm set(on R side) Nurse Communication: Mobility status PT Visit Diagnosis: Muscle weakness (generalized) (M62.81);Unsteadiness on feet (R26.81);History of falling (Z91.81)     Time: 9937-1696 PT Time Calculation (min) (ACUTE ONLY): 17 min  Charges:  $Therapeutic Activity: 8-22 mins                     Lewis Shock, PT, DPT Acute Rehabilitation Services Pager #: [phone] Office #: [phone]    Iona Hansen 08/20/2019, 10:49 AM

## 2019-08-21 LAB — CBC
HCT: 37 % (ref 36.0–46.0)
Hemoglobin: 12.7 g/dL (ref 12.0–15.0)
MCH: 33.2 pg (ref 26.0–34.0)
MCHC: 34.3 g/dL (ref 30.0–36.0)
MCV: 96.6 fL (ref 80.0–100.0)
Platelets: 235 10*3/uL (ref 150–400)
RBC: 3.83 MIL/uL — ABNORMAL LOW (ref 3.87–5.11)
RDW: 15.7 % — ABNORMAL HIGH (ref 11.5–15.5)
WBC: 8.9 10*3/uL (ref 4.0–10.5)
nRBC: 0 % (ref 0.0–0.2)

## 2019-08-21 LAB — GLUCOSE, CAPILLARY
Glucose-Capillary: 98 mg/dL (ref 70–99)
Glucose-Capillary: 155 mg/dL — ABNORMAL HIGH (ref 70–99)

## 2019-08-21 LAB — RENAL FUNCTION PANEL
Albumin: 2.6 g/dL — ABNORMAL LOW (ref 3.5–5.0)
Anion gap: 11 (ref 5–15)
BUN: 15 mg/dL (ref 8–23)
CO2: 27 mmol/L (ref 22–32)
Calcium: 8.8 mg/dL — ABNORMAL LOW (ref 8.9–10.3)
Chloride: 102 mmol/L (ref 98–111)
Creatinine, Ser: 0.53 mg/dL (ref 0.44–1.00)
GFR calc Af Amer: >60 mL/min (ref >60)
GFR calc non Af Amer: >60 mL/min (ref >60)
Glucose, Bld: 111 mg/dL — ABNORMAL HIGH (ref 70–99)
Phosphorus: 3.9 mg/dL (ref 2.5–4.6)
Potassium: 3.9 mmol/L (ref 3.5–5.1)
Sodium: 140 mmol/L (ref 135–145)

## 2019-08-21 LAB — MAGNESIUM: Magnesium: 2.1 mg/dL (ref 1.7–2.4)

## 2019-08-21 MED ORDER — METOPROLOL TARTRATE 25 MG PO TABS: 25.0000 mg | ORAL_TABLET | Freq: Two times a day (BID) | ORAL | 1 refills | Status: DC

## 2019-08-21 MED ORDER — DEXAMETHASONE 1 MG PO TABS: ORAL_TABLET | ORAL | 0 refills | Status: AC

## 2019-08-21 NOTE — TOC Transition Note (Signed)
Transition of Care [facility]) - CM/SW Discharge Note   Patient Details  Name: Tracey Porter MRN: [id number] Date of Birth: March 23, 1930  Transition of Care [facility]) CM/SW Contact:  Terrilee Croak, Student-Social Work Phone Number: 08/21/2019, 10:02 AM   Clinical Narrative:     Nurse to call report to [phone] Room number 34  Final next level of care: Skilled Nursing Facility Barriers to Discharge: Barriers Resolved   Patient Goals and CMS Choice Patient states their goals for this hospitalization and ongoing recovery are:: patient unable to participate in goal setting at this time due to altered mental status CMS Medicare.gov Compare Post Acute Care list provided to:: Patient Represenative (must comment) Choice offered to / list presented to : Adult Children  Discharge Placement                Patient to be transferred to facility by: PTAR Name of family member notified: Victorino Dike Patient and family notified of of transfer: 08/21/19  Discharge Plan and Services     Post Acute Care Choice: Home Health                    HH Arranged: PT, OT, Speech Therapy HH Agency: [facility] Date [facility] Agency Contacted: 08/15/19 Time HH Agency Contacted: [phone] Representative spoke with at [facility] Agency: Rene Kocher  Social Determinants of Health (SDOH) Interventions     Readmission Risk Interventions No flowsheet data found.

## 2019-08-21 NOTE — Discharge Summary (Signed)
Physician Discharge Summary  Tracey Porter ZOX:[id number] DOB: 1930-02-09 DOA: 08/13/2019  PCP: Florentina Jenny, MD  Admit date: 08/13/2019 Discharge date: 08/21/2019  Admitted From: ALF Disposition: SNF with hospice follow-up  Recommendations for Outpatient Follow-up:  1. Follow ups as below. 2. Please obtain CBC/BMP/Mag at follow up 3. Please follow up on the following pending results: None  Home Health: Discharged to SNF with hospice Equipment/Devices: None  Discharge Condition: Stable CODE STATUS: Full code   Contact information for follow-up providers    [facility]. Schedule an appointment as soon as possible for a visit in 2 week(s).   Why: if she fails voiding trial in a week Contact information: [address] [place] [address] [phone]           Contact information for after-discharge care    Destination    [facility] Preferred SNF .   Service: Skilled Nursing Contact information: [address] [place] [address] [phone]                  Hospital Course: 86 year old female with history of dementia, diet-controlled DM-2, HTN, paroxysmal A. fib on Eliquis and hospitalization for COVID-19 pneumonia 2 months ago.  Patient was discharged on dexamethasone 6 mg for 4 more days but continued on base at ALF.   Patient presented with generalized weakness and decreased oral intake in the setting of recent urinary tract infection and prolonged steroid course.  She was admitted for generalized weakness, acute encephalopathy, orthostatic hypotension and acute urinary retention.   Orthostatic hypotension resolved with IV fluid and adjustments of a cardiac medications.  She has been started on dexamethasone taper.  However, she remained weak without focal neuro deficit.  She had extensive work-up but not revealing.  She was also started on low-dose Zoloft out of concern for  underlying psychiatric contributing to her lack of motivation and weakness. She was evaluated by PT/OT who recommended SNF.   In regards to acute urinary retention, indwelling Foley catheter placed on 08/15/2019.  Urology, Dr. Linzie Collin recommended indwelling Foley catheter for 1 week before voiding trial.  If she fails voiding trial in 1 week, urology recommended replacing indwelling Foley and outpatient follow-up.  See individual problem list below for more on hospital course.  Discharge Diagnoses:  Generalized weakness/acute metabolic encephalopathy in patient with dementia: unclear etiology but multiple potential causes including recent UTI, iatrogenic (prolonged steroid and cardiac meds), orthostasis, depression, delirium and generalized deconditioning from recent COVID-19 infection.  Work-up including echocardiogram, B12, thyroid panel, CK, and CT head not revealing.  Ammonia was slightly elevated but resolved.  Has no apparent neuro deficit.  Patient is alert and awake but only oriented to self.  She always thinks she is at rehab. -Adjusted cardiac meds.  Orthostatic hypotension resolved. -Continue tapering Decadron-she will be on 2 mg daily for 3 days followed by 1 mg daily for 3 days to complete taper -Continue PT/OT therapy daily at SNF -Started on low-dose Zoloft for possible undiagnosed depression.  Orthostatic hypotension: Resolved after discontinuing home amlodipine, Cardizem and labetalol -Apply TED hose-if she agrees.  Paroxysmal A. Fib: Rate controlled. -Continue amiodarone, metoprolol and Eliquis.  Chronic diastolic CHF: Echo with EF of 65 to 70% and G1 DD.  Appears euvolemic.  Not on diuretics. -Monitor fluid status. -Liberate diet given advanced dementia.  Acute urinary retention: Reportedly had 1200 cc on I&O cath on presentation. Foley catheter inserted on 2/3 -Dr.  Herick, urology-voiding trial in a week, and outpatient follow-up if she fails. -Discontinued home  Myrbetriq  Low TSH/slightly elevated free T4: Likely due to amiodarone. -Recheck in 4 to 6 weeks outpatient.  Hypokalemia: Resolved.  Generalized weakness/debility/physical deconditioning-partly psychogenic. -PT/OT-SNF -Antidepressants as below.  Depressed mood/fatigue: Appears to be in better spirits today. -Started Zoloft 25 mg daily after discussion with patient's daughter. -Continue nightly trazodone  Pressure injury of skin Pressure Injury 08/14/19 Left Stage 1 -  Intact skin with non-blanchable redness of a localized area usually over a bony prominence. old red firm (Active)  08/14/19 0215  Location:   Location Orientation: Left  Staging: Stage 1 -  Intact skin with non-blanchable redness of a localized area usually over a bony prominence.  Wound Description (Comments): old red firm  Present on Admission: Yes       Discharge Instructions  Discharge Instructions    Diet general   Complete by: As directed    Increase activity slowly   Complete by: As directed      Allergies as of 08/21/2019      Reactions   Penicillins Rash   Did it involve swelling of the face/tongue/throat, SOB, or low BP? Unknown Did it involve sudden or severe rash/hives, skin peeling, or any reaction on the inside of your mouth or nose? Unknown Did you need to seek medical attention at a hospital or doctor's office? Unknown When did it last happen? If all above answers are NO, may proceed with cephalosporin use.      Medication List    STOP taking these medications   acyclovir 800 MG tablet Commonly known as: ZOVIRAX   amLODipine 5 MG tablet Commonly known as: NORVASC   diltiazem 120 MG 24 hr capsule Commonly known as: CARDIZEM CD   labetalol 100 MG tablet Commonly known as: NORMODYNE   Myrbetriq 25 MG Tb24 tablet Generic drug: mirabegron ER     TAKE these medications   acetaminophen 500 MG tablet Commonly known as: TYLENOL Take 1,000 mg by mouth See admin  instructions. 1000 mg twice a day and 500 mg every 4 hours as needed for pain/headache   amiodarone 200 MG tablet Commonly known as: PACERONE Take 200 mg by mouth daily.   apixaban 5 MG Tabs tablet Commonly known as: ELIQUIS Take 1 tablet (5 mg total) by mouth 2 (two) times daily. Please cancel previous rx's sent; call pt with price before filling   conjugated estrogens vaginal cream Commonly known as: PREMARIN Place 1 Applicatorful vaginally every other day.   Culturelle Kids Chew Chew 1 tablet by mouth daily.   dexamethasone 1 MG tablet Commonly known as: DECADRON Take 2 tablets (2 mg total) by mouth daily for 3 days, THEN 1 tablet (1 mg total) daily for 3 days. Start taking on: August 21, 2019   ferrous sulfate 325 (65 FE) MG tablet Take 325 mg by mouth daily with breakfast.   ICaps Plus Tabs Take 1 tablet by mouth 2 (two) times daily.   lidocaine 5 % Commonly known as: LIDODERM Place 1 patch onto the skin as needed (for pain).   loperamide 2 MG capsule Commonly known as: IMODIUM Take 1 capsule (2 mg total) by mouth as needed for diarrhea or loose stools.   Melatonin 3 MG Tabs Take 1 tablet (3 mg total) by mouth at bedtime.   metoprolol tartrate 25 MG tablet Commonly known as: LOPRESSOR Take 1 tablet (25 mg total) by mouth 2 (two) times daily.  nitroGLYCERIN 0.4 MG SL tablet Commonly known as: NITROSTAT Place 0.4 mg under the tongue every 5 (five) minutes as needed for chest pain.   ondansetron 8 MG tablet Commonly known as: ZOFRAN Take 8 mg by mouth 2 (two) times daily as needed for nausea or vomiting.   pantoprazole 40 MG tablet Commonly known as: PROTONIX Take 40 mg by mouth daily.   polyethylene glycol 17 g packet Commonly known as: MIRALAX / GLYCOLAX Take 17 g by mouth daily.   prednisoLONE acetate 1 % ophthalmic suspension Commonly known as: PRED FORTE Place 1 drop into the right eye daily.   senna-docusate 8.6-50 MG tablet Commonly known as:  Senokot-S Take 1 tablet by mouth daily as needed for mild constipation.   sertraline 25 MG tablet Commonly known as: ZOLOFT Take 1 tablet (25 mg total) by mouth daily.   Slow-Mag 64 MG Tbec SR tablet Generic drug: magnesium chloride Take 64 mg by mouth daily.   Systane 0.4-0.3 % Soln Generic drug: Polyethyl Glycol-Propyl Glycol Apply 1 drop to eye 2 (two) times daily.   timolol 0.5 % ophthalmic solution Commonly known as: BETIMOL Place 1 drop into both eyes 2 (two) times daily.   traZODone 50 MG tablet Commonly known as: DESYREL Take 50 mg by mouth at bedtime as needed for sleep.   vitamin B-12 1000 MCG tablet Commonly known as: CYANOCOBALAMIN Take 1,000 mcg by mouth daily.   vitamin C 500 MG tablet Commonly known as: ASCORBIC ACID Take 500 mg by mouth 2 (two) times daily.   Vitamin D 125 MCG (5000 UT) Caps Take 5,000 Units by mouth daily.       Consultations:  Urology over the phone  Procedures/Studies:  2D Echo on 08/14/2019 1. Left ventricular ejection fraction, by visual estimation, is 65 to  70%. The left ventricle has normal function. Left ventricular septal wall  thickness was severely increased. Severely increased left ventricular  posterior wall thickness. Inadequate  for regional wall motion assessment as patient requested study to be  terminated and no apical 2 or 3 chamber view obtained.  2. Left ventricular diastolic parameters are consistent with Grade I  diastolic dysfunction (impaired relaxation).  3. Global right ventricle has normal systolic function.The right  ventricular size is normal. No increase in right ventricular wall  thickness.  4. Left atrial size was mildly dilated.  5. Right atrial size was normal.  6. Moderate calcification of the mitral valve leaflet(s). Moderate  thickening of the mitral valve leaflet(s). Severe mitral annular  calcification. Mild mitral valve regurgitation. No evidence of mitral  stenosis.  7. The  tricuspid valve is normal in structure. Tricuspid valve  regurgitation is not demonstrated.  8. The aortic valve is tricuspid. There is severe calcifcation of the  aortic valve. There is severe thickening of the aortic valve. Aortic valve  regurgitation is not visualized. No evidence of aortic valve sclerosis or  stenosis.  9. The pulmonic valve was normal in structure. Pulmonic valve  regurgitation is mild.  10. Cannot assess PASP due to IVC not visualized.    CT HEAD WO CONTRAST  Result Date: 08/13/2019 CLINICAL DATA:  Altered mental status. EXAM: CT HEAD WITHOUT CONTRAST TECHNIQUE: Contiguous axial images were obtained from the base of the skull through the vertex without intravenous contrast. COMPARISON:  August 05, 2019 FINDINGS: Brain: There is mild to moderate severity cerebral atrophy with widening of the extra-axial spaces and ventricular dilatation. There are areas of decreased attenuation within the white matter  tracts of the supratentorial brain, consistent with microvascular disease changes. Vascular: No hyperdense vessel or unexpected calcification. Skull: Normal. Negative for fracture or focal lesion. Sinuses/Orbits: No acute finding. Other: None. IMPRESSION: 1. Generalized cerebral atrophy. 2. No acute intracranial abnormality. Electronically Signed   By: Aram Candela M.D.   On: 08/13/2019 18:46   CT Head Wo Contrast  Result Date: 08/05/2019 CLINICAL DATA:  Fall EXAM: CT HEAD WITHOUT CONTRAST CT CERVICAL SPINE WITHOUT CONTRAST TECHNIQUE: Multidetector CT imaging of the head and cervical spine was performed following the standard protocol without intravenous contrast. Multiplanar CT image reconstructions of the cervical spine were also generated. COMPARISON:  CT head dated 04/30/2018 FINDINGS: CT HEAD FINDINGS Brain: No evidence of acute infarction, hemorrhage, hydrocephalus, extra-axial collection or mass lesion/mass effect. Mild cortical atrophy. Subcortical white matter and  periventricular small vessel ischemic changes. Vascular: Intracranial atherosclerosis. Skull: Normal. Negative for fracture or focal lesion. Sinuses/Orbits: The visualized paranasal sinuses are essentially clear. The mastoid air cells are unopacified. Other: None. CT CERVICAL SPINE FINDINGS Alignment: Normal cervical lordosis. Skull base and vertebrae: No acute fracture. No primary bone lesion or focal pathologic process. Soft tissues and spinal canal: No prevertebral fluid or swelling. No visible canal hematoma. Disc levels: Mild degenerative changes of the mid/lower cervical spine. Spinal canal is patent. Upper chest: Visualized lung apices are clear. Other: Visualized right thyroid is mildly heterogeneous. IMPRESSION: No evidence of acute intracranial abnormality. Atrophy with small vessel ischemic changes. No evidence of traumatic injury to the cervical spine. Mild degenerative changes. Electronically Signed   By: Charline Bills M.D.   On: 08/05/2019 05:59   CT Cervical Spine Wo Contrast  Result Date: 08/05/2019 CLINICAL DATA:  Fall EXAM: CT HEAD WITHOUT CONTRAST CT CERVICAL SPINE WITHOUT CONTRAST TECHNIQUE: Multidetector CT imaging of the head and cervical spine was performed following the standard protocol without intravenous contrast. Multiplanar CT image reconstructions of the cervical spine were also generated. COMPARISON:  CT head dated 04/30/2018 FINDINGS: CT HEAD FINDINGS Brain: No evidence of acute infarction, hemorrhage, hydrocephalus, extra-axial collection or mass lesion/mass effect. Mild cortical atrophy. Subcortical white matter and periventricular small vessel ischemic changes. Vascular: Intracranial atherosclerosis. Skull: Normal. Negative for fracture or focal lesion. Sinuses/Orbits: The visualized paranasal sinuses are essentially clear. The mastoid air cells are unopacified. Other: None. CT CERVICAL SPINE FINDINGS Alignment: Normal cervical lordosis. Skull base and vertebrae: No acute  fracture. No primary bone lesion or focal pathologic process. Soft tissues and spinal canal: No prevertebral fluid or swelling. No visible canal hematoma. Disc levels: Mild degenerative changes of the mid/lower cervical spine. Spinal canal is patent. Upper chest: Visualized lung apices are clear. Other: Visualized right thyroid is mildly heterogeneous. IMPRESSION: No evidence of acute intracranial abnormality. Atrophy with small vessel ischemic changes. No evidence of traumatic injury to the cervical spine. Mild degenerative changes. Electronically Signed   By: Charline Bills M.D.   On: 08/05/2019 05:59   DG Chest Portable 1 View  Result Date: 08/13/2019 CLINICAL DATA:  Generalized weakness for 2 days. EXAM: PORTABLE CHEST 1 VIEW COMPARISON:  June 22, 2019 FINDINGS: Mild, chronic appearing increased interstitial lung markings are seen which are stable in severity. Mild, stable linear scarring and/or atelectasis is seen within the bilateral lung bases. There is no evidence of a pleural effusion or pneumothorax. The heart size and mediastinal contours are within normal limits. There is marked severity calcification of the thoracic aorta. Multilevel degenerative changes seen throughout the thoracic spine. IMPRESSION: 1. Chronic appearing increased interstitial  lung markings with mild, stable bibasilar linear scarring and/or atelectasis. Electronically Signed   By: Aram Candelahaddeus  Houston M.D.   On: 08/13/2019 18:02   ECHOCARDIOGRAM COMPLETE  Result Date: 08/14/2019   ECHOCARDIOGRAM REPORT   Patient Name:   Deloris PingCONSTANCE Buffa Date of Exam: 08/14/2019 Medical Rec #:  [id number]         Height:       64.0 in Accession #:    [id number]        Weight:       140.0 lb Date of Birth:  06/10/1930          BSA:          1.68 m Patient Age:    86 years          BP:           122/75 mmHg Patient Gender: F                 HR:           97 bpm. Exam Location:  Inpatient Procedure: 2D Echo Indications:    Abnormal ECG 794.31/R94.31   History:        Patient has prior history of Echocardiogram examinations, most                 recent 05/12/2017. Aortic Valve Disease, Arrythmias:Atrial                 Fibrillation; Risk Factors:Diabetes and Hypertension.  Sonographer:    Ross LudwigArthur Guy RDCS (AE) Referring Phys: [phone] JARED M GARDNER  Sonographer Comments: Technically difficult study due to poor echo windows. Image acquisition challenging due to uncooperative patient. Attempted to continue test three times after patient said to stop. When asked if she wanted to stop the test, she said  go ahead. After last attempt patient requested that I stop the test due to probe pressure making it hard for her to breath. Test stopped at that point. IMPRESSIONS  1. Left ventricular ejection fraction, by visual estimation, is 65 to 70%. The left ventricle has normal function. Left ventricular septal wall thickness was severely increased. Severely increased left ventricular posterior wall thickness. Inadequate for regional wall motion assessment as patient requested study to be terminated and no apical 2 or 3 chamber view obtained.  2. Left ventricular diastolic parameters are consistent with Grade I diastolic dysfunction (impaired relaxation).  3. Global right ventricle has normal systolic function.The right ventricular size is normal. No increase in right ventricular wall thickness.  4. Left atrial size was mildly dilated.  5. Right atrial size was normal.  6. Moderate calcification of the mitral valve leaflet(s). Moderate thickening of the mitral valve leaflet(s). Severe mitral annular calcification. Mild mitral valve regurgitation. No evidence of mitral stenosis.  7. The tricuspid valve is normal in structure. Tricuspid valve regurgitation is not demonstrated.  8. The aortic valve is tricuspid. There is severe calcifcation of the aortic valve. There is severe thickening of the aortic valve. Aortic valve regurgitation is not visualized. No evidence of aortic valve  sclerosis or stenosis.  9. The pulmonic valve was normal in structure. Pulmonic valve regurgitation is mild. 10. Cannot assess PASP due to IVC not visualized. FINDINGS  Left Ventricle: Left ventricular ejection fraction, by visual estimation, is 65 to 70%. The left ventricle has normal function. The left ventricle is not well visualized. Severely increased left ventricular posterior wall thickness. There is no left ventricular hypertrophy. The left ventricular diastology could not be evaluated  due to mitral annular calcification (moderate or greater). Left ventricular diastolic parameters are consistent with Grade I diastolic dysfunction (impaired relaxation). Indeterminate filling pressures. Right Ventricle: The right ventricular size is normal. No increase in right ventricular wall thickness. Global RV systolic function is has normal systolic function. Cannot assess PASP due to IVC not visualized. Left Atrium: Left atrial size was mildly dilated. Right Atrium: Right atrial size was normal in size Pericardium: There is no evidence of pericardial effusion. Mitral Valve: The mitral valve is normal in structure. There is moderate thickening of the mitral valve leaflet(s). There is moderate calcification of the mitral valve leaflet(s). Severe mitral annular calcification. Mild mitral valve regurgitation. No evidence of mitral valve stenosis by observation. Tricuspid Valve: The tricuspid valve is normal in structure. Tricuspid valve regurgitation is not demonstrated. Aortic Valve: The aortic valve is tricuspid. . There is severe thickening and severe calcifcation of the aortic valve. Aortic valve regurgitation is not visualized. The aortic valve is structurally normal, with no evidence of sclerosis or stenosis. Moderate to severe aortic valve annular calcification. There is severe thickening of the aortic valve. There is severe calcifcation of the aortic valve. Aortic valve mean gradient measures 7.6 mmHg. Aortic valve  peak gradient measures 13.0 mmHg. Pulmonic Valve: The pulmonic valve was normal in structure. Pulmonic valve regurgitation is mild. Pulmonic regurgitation is mild. Aorta: The aortic root, ascending aorta and aortic arch are all structurally normal, with no evidence of dilitation or obstruction. Venous: The inferior vena cava was not well visualized. IAS/Shunts: No atrial level shunt detected by color flow Doppler. There is no evidence of a patent foramen ovale. No ventricular septal defect is seen or detected. There is no evidence of an atrial septal defect.  LEFT VENTRICLE PLAX 2D LVIDd:         2.46 cm LVIDs:         2.01 cm LV PW:         1.73 cm LV IVS:        2.20 cm LV SV:         9 ml LV SV Index:   5.02  LEFT ATRIUM           Index LA diam:      3.70 cm 2.20 cm/m LA Vol (A4C): 61.9 ml 36.82 ml/m  AORTIC VALVE AV Vmax:           180.20 cm/s AV Vmean:          130.000 cm/s AV VTI:            0.257 m AV Peak Grad:      13.0 mmHg AV Mean Grad:      7.6 mmHg LVOT Vmax:         103.80 cm/s LVOT Vmean:        73.600 cm/s LVOT VTI:          0.170 m LVOT/AV VTI ratio: 0.66  AORTA Ao Root diam: 3.00 cm Ao Asc diam:  3.70 cm MITRAL VALVE                         TRICUSPID VALVE MV Area (PHT): 3.74 cm              TR Peak grad:   23.4 mmHg MV PHT:        58.87 msec            TR Vmax:  246.00 cm/s MV Decel Time: 203 msec MV E velocity: 63.60 cm/s  103 cm/s  SHUNTS MV A velocity: 105.00 cm/s 70.3 cm/s Systemic VTI: 0.17 m MV E/A ratio:  0.61        1.5  Armanda Magic MD Electronically signed by Armanda Magic MD Signature Date/Time: 08/14/2019/10:54:52 AM    Final    DG Hip Unilat W or Wo Pelvis 2-3 Views Right  Result Date: 08/05/2019 CLINICAL DATA:  Fall out of bed EXAM: DG HIP (WITH OR WITHOUT PELVIS) 2-3V RIGHT COMPARISON:  CT abdomen/pelvis dated 06/23/2019 FINDINGS: Right hip arthroplasty, without evidence of complication. Old bilateral superior and inferior pubic rami fractures, right greater than left.  Visualized bony pelvis is otherwise intact. Degenerative changes the lower lumbar spine. IMPRESSION: Right hip arthroplasty, without evidence of complication. Bilateral pelvic ring fractures, chronic. Electronically Signed   By: Charline Bills M.D.   On: 08/05/2019 05:40      Discharge Exam: Vitals:   08/21/19 0437 08/21/19 0747  BP: 117/70 129/81  Pulse: 79 82  Resp: 18 20  Temp: 97.6 F (36.4 C) (!) 97.5 F (36.4 C)  SpO2: 99% 98%    GENERAL: No acute distress.  Appears well.  HEENT: MMM.  Vision and hearing grossly intact.  NECK: Supple.  No apparent JVD.  RESP:  No IWOB. Good air movement bilaterally. CVS:  RRR. Heart sounds normal.  ABD/GI/GU: Bowel sounds present. Soft. Non tender.  MSK/EXT:  Moves extremities. No apparent deformity or edema.  SKIN: no apparent skin lesion or wound NEURO: Awake, alert and oriented self.  She thinks she is at rehab.  No apparent focal neuro deficit. PSYCH: Calm. Normal affect.   The results of significant diagnostics from this hospitalization (including imaging, microbiology, ancillary and laboratory) are listed below for reference.     Microbiology: Recent Results (from the past 240 hour(s))  SARS CORONAVIRUS 2 (TAT 6-24 HRS) Nasopharyngeal Nasopharyngeal Swab     Status: None   Collection Time: 08/20/19 11:17 AM   Specimen: Nasopharyngeal Swab  Result Value Ref Range Status   SARS Coronavirus 2 NEGATIVE NEGATIVE Final    Comment: (NOTE) SARS-CoV-2 target nucleic acids are NOT DETECTED. The SARS-CoV-2 RNA is generally detectable in upper and lower respiratory specimens during the acute phase of infection. Negative results do not preclude SARS-CoV-2 infection, do not rule out co-infections with other pathogens, and should not be used as the sole basis for treatment or other patient management decisions. Negative results must be combined with clinical observations, patient history, and epidemiological information. The  expected result is Negative. Fact Sheet for Patients: HairSlick.no Fact Sheet for Healthcare Providers: quierodirigir.com This test is not yet approved or cleared by the [place] FDA and  has been authorized for detection and/or diagnosis of SARS-CoV-2 by FDA under an Emergency Use Authorization (EUA). This EUA will remain  in effect (meaning this test can be used) for the duration of the COVID-19 declaration under Section 56 4(b)(1) of the Act, 21 U.S.C. section 360bbb-3(b)(1), unless the authorization is terminated or revoked sooner. Performed at [facility], [address] [place], [place] [address]      Labs: BNP (last 3 results) Recent Labs    08/15/19 1524  BNP 201.0*   Basic Metabolic Panel: Recent Labs  Lab 08/16/19 0518 08/17/19 0336 08/18/19 0429 08/21/19 0300  NA 138 141 139 140  K 3.6 3.3* 3.8 3.9  CL 99 100 102 102  CO2 26 29 27  27  GLUCOSE 134* 109* 98 111*  BUN 14 12 15 15   CREATININE 0.39* 0.37* 0.50 0.53  CALCIUM 8.8* 8.8* 8.7* 8.8*  MG 1.8 2.1 1.9 2.1  PHOS 2.0* 2.6 2.1* 3.9   Liver Function Tests: Recent Labs  Lab 08/16/19 0518 08/17/19 0336 08/18/19 0429 08/21/19 0300  ALBUMIN 2.6* 2.6* 2.6* 2.6*   No results for input(s): LIPASE, AMYLASE in the last 168 hours. Recent Labs  Lab 08/15/19 1524  AMMONIA 60*   CBC: Recent Labs  Lab 08/16/19 0518 08/17/19 0336 08/21/19 0300  WBC 10.9* 9.8 8.9  HGB 12.2 12.1 12.7  HCT 36.4 36.0 37.0  MCV 96.8 96.0 96.6  PLT 188 191 235   Cardiac Enzymes: Recent Labs  Lab 08/15/19 1524  CKTOTAL 27*   BNP: Invalid input(s): POCBNP CBG: Recent Labs  Lab 08/20/19 0614 08/20/19 1220 08/20/19 1546 08/20/19 2144 08/21/19 0558  GLUCAP 88 151* 159* 130* 98   D-Dimer No results for input(s): DDIMER in the last 72 hours. Hgb A1c No results for input(s): HGBA1C in the last 72 hours. Lipid Profile No results for input(s):  CHOL, HDL, LDLCALC, TRIG, CHOLHDL, LDLDIRECT in the last 72 hours. Thyroid function studies No results for input(s): TSH, T4TOTAL, T3FREE, THYROIDAB in the last 72 hours.  Invalid input(s): FREET3 Anemia work up No results for input(s): VITAMINB12, FOLATE, FERRITIN, TIBC, IRON, RETICCTPCT in the last 72 hours. Urinalysis    Component Value Date/Time   COLORURINE AMBER (A) 08/13/2019 2030   APPEARANCEUR CLEAR 08/13/2019 2030   LABSPEC 1.020 08/13/2019 2030   PHURINE 5.0 08/13/2019 2030   GLUCOSEU >=500 (A) 08/13/2019 2030   HGBUR NEGATIVE 08/13/2019 2030   [place] 08/13/2019 2030   KETONESUR 5 (A) 08/13/2019 2030   PROTEINUR NEGATIVE 08/13/2019 2030   UROBILINOGEN 0.2 03/19/2015 1945   NITRITE NEGATIVE 08/13/2019 2030   LEUKOCYTESUR NEGATIVE 08/13/2019 2030   Sepsis Labs Invalid input(s): PROCALCITONIN,  WBC,  LACTICIDVEN   Time coordinating discharge: 35 minutes  SIGNED:  Mercy Riding, MD  [facility] 08/21/2019, 10:02 AM  If 7PM-7AM, please contact night-coverage [email or web address] Password [email or web address]

## 2019-08-30 ENCOUNTER — Ambulatory Visit: Payer: Medicare Other | Admitting: Cardiovascular Disease

## 2019-11-12 ENCOUNTER — Other Ambulatory Visit: Payer: Self-pay

## 2019-11-12 ENCOUNTER — Emergency Department (HOSPITAL_COMMUNITY): Payer: Medicare Other

## 2019-11-12 ENCOUNTER — Inpatient Hospital Stay (HOSPITAL_COMMUNITY)
Admission: EM | Admit: 2019-11-12 | Discharge: 2019-12-11 | DRG: 871 | Disposition: E | Payer: Medicare Other | Source: Skilled Nursing Facility | Attending: Pulmonary Disease | Admitting: Pulmonary Disease

## 2019-11-12 DIAGNOSIS — Z880 Allergy status to penicillin: Secondary | ICD-10-CM

## 2019-11-12 DIAGNOSIS — Z66 Do not resuscitate: Secondary | ICD-10-CM | POA: Diagnosis present

## 2019-11-12 DIAGNOSIS — R791 Abnormal coagulation profile: Secondary | ICD-10-CM | POA: Diagnosis present

## 2019-11-12 DIAGNOSIS — Z7901 Long term (current) use of anticoagulants: Secondary | ICD-10-CM | POA: Diagnosis not present

## 2019-11-12 DIAGNOSIS — A400 Sepsis due to streptococcus, group A: Principal | ICD-10-CM | POA: Diagnosis present

## 2019-11-12 DIAGNOSIS — I5032 Chronic diastolic (congestive) heart failure: Secondary | ICD-10-CM | POA: Diagnosis present

## 2019-11-12 DIAGNOSIS — D62 Acute posthemorrhagic anemia: Secondary | ICD-10-CM | POA: Diagnosis present

## 2019-11-12 DIAGNOSIS — Z9981 Dependence on supplemental oxygen: Secondary | ICD-10-CM

## 2019-11-12 DIAGNOSIS — J9621 Acute and chronic respiratory failure with hypoxia: Secondary | ICD-10-CM | POA: Diagnosis present

## 2019-11-12 DIAGNOSIS — A419 Sepsis, unspecified organism: Secondary | ICD-10-CM

## 2019-11-12 DIAGNOSIS — Z8616 Personal history of COVID-19: Secondary | ICD-10-CM | POA: Diagnosis not present

## 2019-11-12 DIAGNOSIS — G9341 Metabolic encephalopathy: Secondary | ICD-10-CM

## 2019-11-12 DIAGNOSIS — Z96641 Presence of right artificial hip joint: Secondary | ICD-10-CM | POA: Diagnosis present

## 2019-11-12 DIAGNOSIS — F0390 Unspecified dementia without behavioral disturbance: Secondary | ICD-10-CM | POA: Diagnosis present

## 2019-11-12 DIAGNOSIS — R6521 Severe sepsis with septic shock: Secondary | ICD-10-CM | POA: Diagnosis present

## 2019-11-12 DIAGNOSIS — N179 Acute kidney failure, unspecified: Secondary | ICD-10-CM | POA: Diagnosis present

## 2019-11-12 DIAGNOSIS — E162 Hypoglycemia, unspecified: Secondary | ICD-10-CM

## 2019-11-12 DIAGNOSIS — R7401 Elevation of levels of liver transaminase levels: Secondary | ICD-10-CM

## 2019-11-12 DIAGNOSIS — E11649 Type 2 diabetes mellitus with hypoglycemia without coma: Secondary | ICD-10-CM | POA: Diagnosis present

## 2019-11-12 DIAGNOSIS — E119 Type 2 diabetes mellitus without complications: Secondary | ICD-10-CM | POA: Diagnosis present

## 2019-11-12 DIAGNOSIS — R339 Retention of urine, unspecified: Secondary | ICD-10-CM | POA: Diagnosis present

## 2019-11-12 DIAGNOSIS — Z79899 Other long term (current) drug therapy: Secondary | ICD-10-CM

## 2019-11-12 DIAGNOSIS — I4891 Unspecified atrial fibrillation: Secondary | ICD-10-CM | POA: Diagnosis present

## 2019-11-12 DIAGNOSIS — Z90710 Acquired absence of both cervix and uterus: Secondary | ICD-10-CM

## 2019-11-12 DIAGNOSIS — Z79891 Long term (current) use of opiate analgesic: Secondary | ICD-10-CM

## 2019-11-12 DIAGNOSIS — Z515 Encounter for palliative care: Secondary | ICD-10-CM | POA: Diagnosis not present

## 2019-11-12 DIAGNOSIS — E876 Hypokalemia: Secondary | ICD-10-CM | POA: Diagnosis present

## 2019-11-12 DIAGNOSIS — I110 Hypertensive heart disease with heart failure: Secondary | ICD-10-CM | POA: Diagnosis present

## 2019-11-12 DIAGNOSIS — R5381 Other malaise: Secondary | ICD-10-CM | POA: Diagnosis present

## 2019-11-12 DIAGNOSIS — K7200 Acute and subacute hepatic failure without coma: Secondary | ICD-10-CM | POA: Diagnosis present

## 2019-11-12 DIAGNOSIS — E872 Acidosis: Secondary | ICD-10-CM | POA: Diagnosis present

## 2019-11-12 DIAGNOSIS — Z79890 Hormone replacement therapy (postmenopausal): Secondary | ICD-10-CM

## 2019-11-12 DIAGNOSIS — K921 Melena: Secondary | ICD-10-CM | POA: Diagnosis present

## 2019-11-12 DIAGNOSIS — Z801 Family history of malignant neoplasm of trachea, bronchus and lung: Secondary | ICD-10-CM

## 2019-11-12 DIAGNOSIS — Z20822 Contact with and (suspected) exposure to covid-19: Secondary | ICD-10-CM | POA: Diagnosis present

## 2019-11-12 DIAGNOSIS — Z7401 Bed confinement status: Secondary | ICD-10-CM

## 2019-11-12 DIAGNOSIS — Z87891 Personal history of nicotine dependence: Secondary | ICD-10-CM

## 2019-11-12 LAB — CBC WITH DIFFERENTIAL/PLATELET
Abs Immature Granulocytes: 0.18 10*3/uL — ABNORMAL HIGH (ref 0.00–0.07)
Basophils Absolute: 0 10*3/uL (ref 0.0–0.1)
Basophils Relative: 0 %
Eosinophils Absolute: 2.6 10*3/uL — ABNORMAL HIGH (ref 0.0–0.5)
Eosinophils Relative: 21 %
HCT: 32.2 % — ABNORMAL LOW (ref 36.0–46.0)
Hemoglobin: 10.6 g/dL — ABNORMAL LOW (ref 12.0–15.0)
Immature Granulocytes: 2 %
Lymphocytes Relative: 2 %
Lymphs Abs: 0.2 10*3/uL — ABNORMAL LOW (ref 0.7–4.0)
MCH: 32 pg (ref 26.0–34.0)
MCHC: 32.9 g/dL (ref 30.0–36.0)
MCV: 97.3 fL (ref 80.0–100.0)
Monocytes Absolute: 0.3 10*3/uL (ref 0.1–1.0)
Monocytes Relative: 3 %
Neutro Abs: 8.8 10*3/uL — ABNORMAL HIGH (ref 1.7–7.7)
Neutrophils Relative %: 72 %
Platelets: 216 10*3/uL (ref 150–400)
RBC: 3.31 MIL/uL — ABNORMAL LOW (ref 3.87–5.11)
RDW: 14.7 % (ref 11.5–15.5)
WBC: 12.2 10*3/uL — ABNORMAL HIGH (ref 4.0–10.5)
nRBC: 0 % (ref 0.0–0.2)

## 2019-11-12 LAB — COMPREHENSIVE METABOLIC PANEL
ALT: 178 U/L — ABNORMAL HIGH (ref 0–44)
ALT: 239 U/L — ABNORMAL HIGH (ref 0–44)
AST: 474 U/L — ABNORMAL HIGH (ref 15–41)
AST: 613 U/L — ABNORMAL HIGH (ref 15–41)
Albumin: 1.4 g/dL — ABNORMAL LOW (ref 3.5–5.0)
Albumin: 1.6 g/dL — ABNORMAL LOW (ref 3.5–5.0)
Alkaline Phosphatase: 95 U/L (ref 38–126)
Alkaline Phosphatase: 126 U/L (ref 38–126)
Anion gap: 19 — ABNORMAL HIGH (ref 5–15)
Anion gap: 19 — ABNORMAL HIGH (ref 5–15)
BUN: 25 mg/dL — ABNORMAL HIGH (ref 8–23)
BUN: 24 mg/dL — ABNORMAL HIGH (ref 8–23)
CO2: 21 mmol/L — ABNORMAL LOW (ref 22–32)
CO2: 17 mmol/L — ABNORMAL LOW (ref 22–32)
Calcium: 7.5 mg/dL — ABNORMAL LOW (ref 8.9–10.3)
Calcium: 7.4 mg/dL — ABNORMAL LOW (ref 8.9–10.3)
Chloride: 100 mmol/L (ref 98–111)
Chloride: 103 mmol/L (ref 98–111)
Creatinine, Ser: 1.68 mg/dL — ABNORMAL HIGH (ref 0.44–1.00)
Creatinine, Ser: 1.49 mg/dL — ABNORMAL HIGH (ref 0.44–1.00)
GFR calc Af Amer: 31 mL/min — ABNORMAL LOW (ref >60)
GFR calc Af Amer: 36 mL/min — ABNORMAL LOW (ref >60)
GFR calc non Af Amer: 27 mL/min — ABNORMAL LOW (ref >60)
GFR calc non Af Amer: 31 mL/min — ABNORMAL LOW (ref >60)
Glucose, Bld: 56 mg/dL — ABNORMAL LOW (ref 70–99)
Glucose, Bld: 55 mg/dL — ABNORMAL LOW (ref 70–99)
Potassium: 3.1 mmol/L — ABNORMAL LOW (ref 3.5–5.1)
Potassium: 3.1 mmol/L — ABNORMAL LOW (ref 3.5–5.1)
Sodium: 140 mmol/L (ref 135–145)
Sodium: 139 mmol/L (ref 135–145)
Total Bilirubin: 1.2 mg/dL (ref 0.3–1.2)
Total Bilirubin: 2 mg/dL — ABNORMAL HIGH (ref 0.3–1.2)
Total Protein: 4 g/dL — ABNORMAL LOW (ref 6.5–8.1)
Total Protein: 4.4 g/dL — ABNORMAL LOW (ref 6.5–8.1)

## 2019-11-12 LAB — BLOOD CULTURE ID PANEL (REFLEXED)

## 2019-11-12 LAB — URINALYSIS, ROUTINE W REFLEX MICROSCOPIC
Bilirubin Urine: NEGATIVE
Glucose, UA: NEGATIVE mg/dL
Hgb urine dipstick: NEGATIVE
Ketones, ur: NEGATIVE mg/dL
Leukocytes,Ua: NEGATIVE
Nitrite: NEGATIVE
Protein, ur: NEGATIVE mg/dL
Specific Gravity, Urine: 1.016 (ref 1.005–1.030)
pH: 5 (ref 5.0–8.0)

## 2019-11-12 LAB — PATHOLOGIST SMEAR REVIEW

## 2019-11-12 LAB — PHOSPHORUS: Phosphorus: 3 mg/dL (ref 2.5–4.6)

## 2019-11-12 LAB — GLUCOSE, CAPILLARY
Glucose-Capillary: 26 mg/dL — CL (ref 70–99)
Glucose-Capillary: 64 mg/dL — ABNORMAL LOW (ref 70–99)
Glucose-Capillary: 105 mg/dL — ABNORMAL HIGH (ref 70–99)

## 2019-11-12 LAB — URINE CULTURE: Culture: NO GROWTH

## 2019-11-12 LAB — CBC
HCT: 39.1 % (ref 36.0–46.0)
Hemoglobin: 12.5 g/dL (ref 12.0–15.0)
MCH: 32.1 pg (ref 26.0–34.0)
MCHC: 32 g/dL (ref 30.0–36.0)
MCV: 100.3 fL — ABNORMAL HIGH (ref 80.0–100.0)
Platelets: 240 10*3/uL (ref 150–400)
RBC: 3.9 MIL/uL (ref 3.87–5.11)
RDW: 14.9 % (ref 11.5–15.5)
WBC: 11 10*3/uL — ABNORMAL HIGH (ref 4.0–10.5)
nRBC: 0 % (ref 0.0–0.2)

## 2019-11-12 LAB — HEPATITIS PANEL, ACUTE
HCV Ab: NONREACTIVE
Hep A IgM: NONREACTIVE
Hep B C IgM: NONREACTIVE
Hepatitis B Surface Ag: NONREACTIVE

## 2019-11-12 LAB — CBG MONITORING, ED
Glucose-Capillary: 30 mg/dL — CL (ref 70–99)
Glucose-Capillary: 110 mg/dL — ABNORMAL HIGH (ref 70–99)

## 2019-11-12 LAB — RESPIRATORY PANEL BY RT PCR (FLU A&B, COVID)
Influenza A by PCR: NEGATIVE
Influenza B by PCR: NEGATIVE
SARS Coronavirus 2 by RT PCR: NEGATIVE

## 2019-11-12 LAB — APTT: aPTT: 50 seconds — ABNORMAL HIGH (ref 24–36)

## 2019-11-12 LAB — PROCALCITONIN: Procalcitonin: 33.58 ng/mL

## 2019-11-12 LAB — PROTIME-INR
INR: 2.4 — ABNORMAL HIGH (ref 0.8–1.2)
Prothrombin Time: 25.1 seconds — ABNORMAL HIGH (ref 11.4–15.2)

## 2019-11-12 LAB — MAGNESIUM: Magnesium: 1.5 mg/dL — ABNORMAL LOW (ref 1.7–2.4)

## 2019-11-12 LAB — LACTIC ACID, PLASMA
Lactic Acid, Venous: 8.2 mmol/L (ref 0.5–1.9)
Lactic Acid, Venous: 7.9 mmol/L (ref 0.5–1.9)
Lactic Acid, Venous: 10.8 mmol/L (ref 0.5–1.9)
Lactic Acid, Venous: >11 mmol/L (ref 0.5–1.9)
Lactic Acid, Venous: >11 mmol/L (ref 0.5–1.9)

## 2019-11-12 IMAGING — DX DG CHEST 1V PORT
1 series · 1 of 1 positions shown · non-contrast
Comparison: Chest radiograph dated 08/13/2019.

CLINICAL DATA: 89-year-old female with sepsis.

EXAM:
PORTABLE CHEST 1 VIEW

[chest]
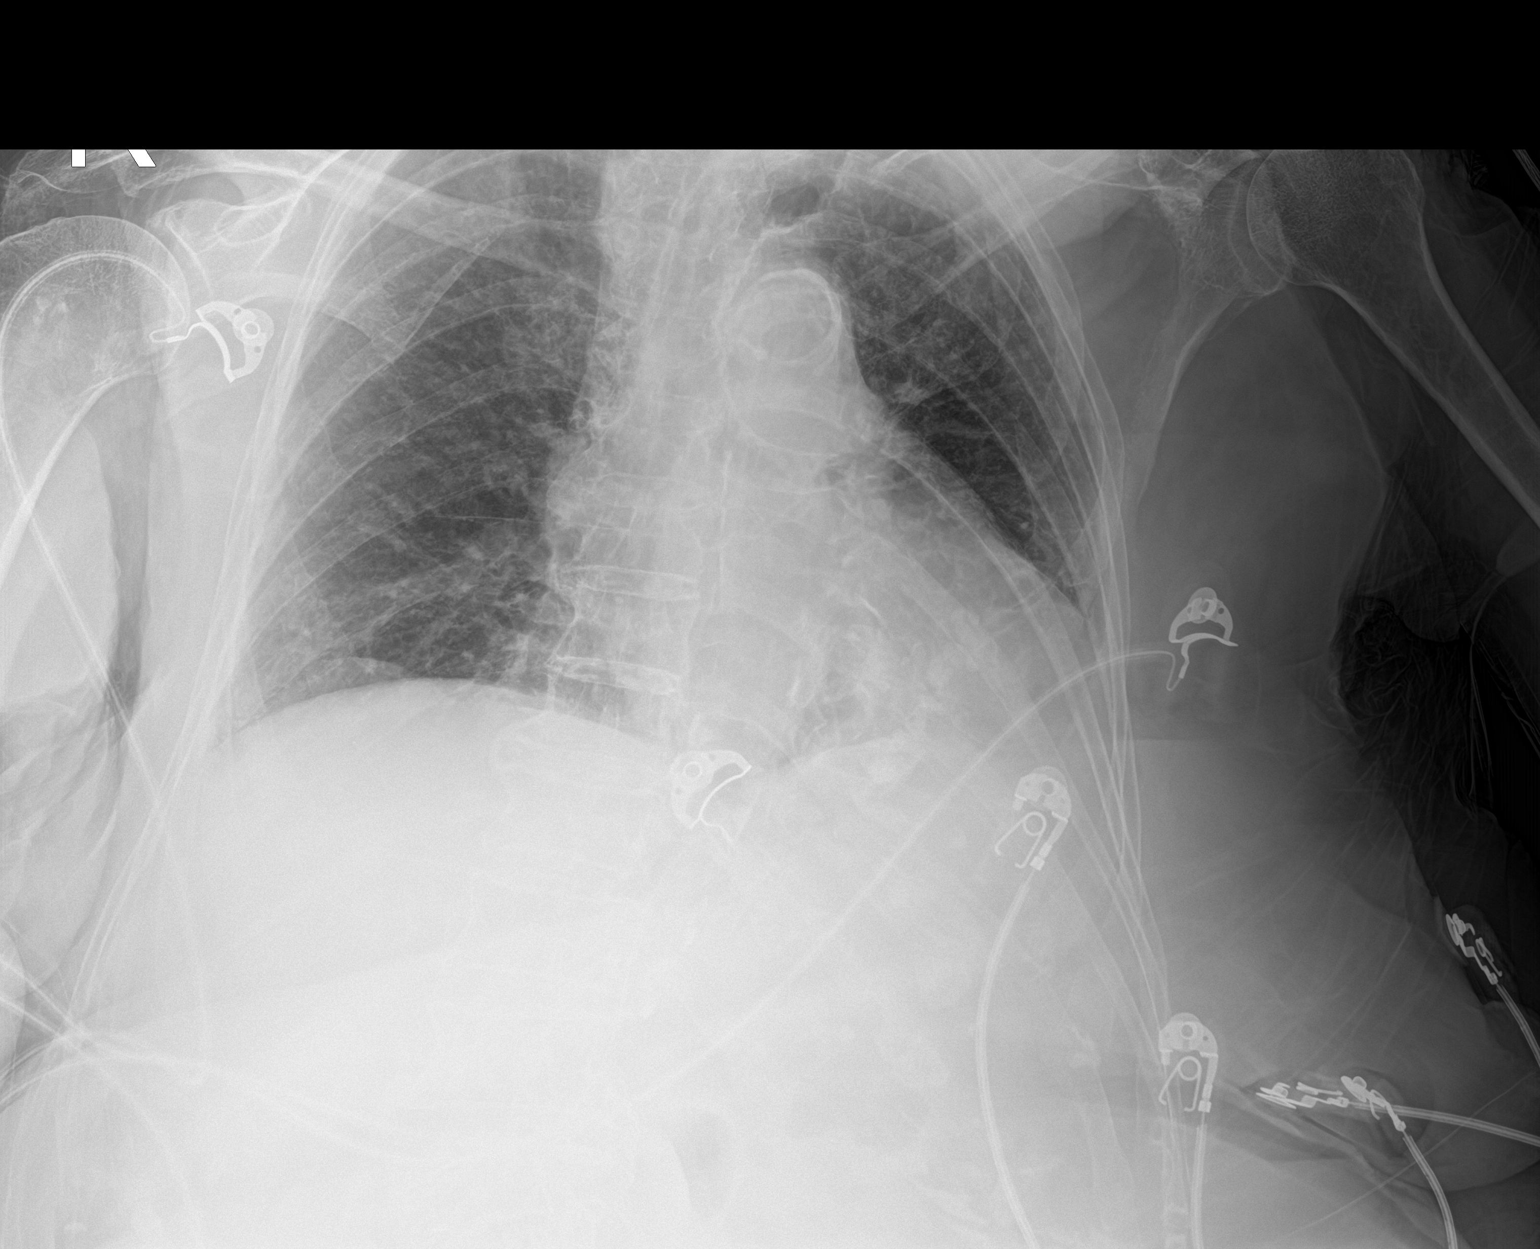

[1 of 1 positions shown; findings below may reference images not displayed]

FINDINGS: No focal consolidation, pleural effusion, pneumothorax. Stable
cardiomegaly. Calcification of the mitral annulus. Atherosclerotic
calcification of the aorta. Osteopenia with degenerative changes of
the spine. No acute osseous pathology.
IMPRESSION: 1. No active cardiopulmonary disease.
2. Stable cardiomegaly.

## 2019-11-12 MED ORDER — DEXTROSE 50 % IV SOLN
1.0000 | Freq: Once | INTRAVENOUS | Status: AC
  Administered 2019-11-12: 10:00:00 50 mL via INTRAVENOUS

## 2019-11-12 MED ORDER — METRONIDAZOLE IN NACL 5-0.79 MG/ML-% IV SOLN
500.0000 mg | Freq: Three times a day (TID) | INTRAVENOUS | Status: DC
  Administered 2019-11-12: 500 mg via INTRAVENOUS
  Filled 2019-11-12: qty 100

## 2019-11-12 MED ORDER — LACTATED RINGERS IV BOLUS (SEPSIS): 250.0000 mL | Freq: Once | INTRAVENOUS | Status: DC

## 2019-11-12 MED ORDER — POLYETHYLENE GLYCOL 3350 17 G PO PACK: 17.0000 g | PACK | Freq: Every day | ORAL | Status: DC | PRN

## 2019-11-12 MED ORDER — ACETAMINOPHEN 325 MG PO TABS: 650.0000 mg | ORAL_TABLET | ORAL | Status: DC | PRN

## 2019-11-12 MED ORDER — ACETAMINOPHEN 325 MG PO TABS: 650.0000 mg | ORAL_TABLET | Freq: Four times a day (QID) | ORAL | Status: DC | PRN

## 2019-11-12 MED ORDER — SODIUM CHLORIDE 0.9 % IV SOLN: INTRAVENOUS | Status: DC | PRN

## 2019-11-12 MED ORDER — AMIODARONE HCL 200 MG PO TABS
200.0000 mg | ORAL_TABLET | Freq: Every day | ORAL | Status: DC
  Filled 2019-11-12: qty 1

## 2019-11-12 MED ORDER — LACTATED RINGERS IV BOLUS
1000.0000 mL | Freq: Once | INTRAVENOUS | Status: AC
  Administered 2019-11-12: 1000 mL via INTRAVENOUS

## 2019-11-12 MED ORDER — MAGNESIUM SULFATE 2 GM/50ML IV SOLN
2.0000 g | Freq: Once | INTRAVENOUS | Status: AC
  Administered 2019-11-12: 10:00:00 2 g via INTRAVENOUS
  Filled 2019-11-12: qty 50

## 2019-11-12 MED ORDER — PANTOPRAZOLE SODIUM 40 MG PO TBEC: 40.0000 mg | DELAYED_RELEASE_TABLET | Freq: Every day | ORAL | Status: DC

## 2019-11-12 MED ORDER — ALBUMIN HUMAN 5 % IV SOLN
12.5000 g | Freq: Once | INTRAVENOUS | Status: AC
  Administered 2019-11-12: 08:00:00 12.5 g via INTRAVENOUS
  Filled 2019-11-12: qty 250

## 2019-11-12 MED ORDER — GLYCOPYRROLATE 0.2 MG/ML IJ SOLN: 0.2000 mg | INTRAMUSCULAR | Status: DC | PRN

## 2019-11-12 MED ORDER — DEXTROSE 50 % IV SOLN
INTRAVENOUS | Status: AC
  Filled 2019-11-12: qty 50

## 2019-11-12 MED ORDER — POTASSIUM CHLORIDE 10 MEQ/100ML IV SOLN
10.0000 meq | INTRAVENOUS | Status: AC
  Administered 2019-11-12 (×2): 10 meq via INTRAVENOUS
  Filled 2019-11-12 (×3): qty 100

## 2019-11-12 MED ORDER — CHLORHEXIDINE GLUCONATE CLOTH 2 % EX PADS: 6.0000 | MEDICATED_PAD | Freq: Every day | CUTANEOUS | Status: DC

## 2019-11-12 MED ORDER — SODIUM CHLORIDE 0.9 % IV SOLN
2.0000 g | Freq: Once | INTRAVENOUS | Status: AC
  Administered 2019-11-12: 01:00:00 2 g via INTRAVENOUS
  Filled 2019-11-12: qty 2

## 2019-11-12 MED ORDER — GLYCOPYRROLATE 1 MG PO TABS: 1.0000 mg | ORAL_TABLET | ORAL | Status: DC | PRN

## 2019-11-12 MED ORDER — LACTATED RINGERS IV BOLUS (SEPSIS)
1000.0000 mL | Freq: Once | INTRAVENOUS | Status: AC
  Administered 2019-11-12: 01:00:00 1000 mL via INTRAVENOUS

## 2019-11-12 MED ORDER — POLYVINYL ALCOHOL 1.4 % OP SOLN
1.0000 [drp] | Freq: Four times a day (QID) | OPHTHALMIC | Status: DC | PRN
  Filled 2019-11-12: qty 15

## 2019-11-12 MED ORDER — SODIUM CHLORIDE 0.9 % IV SOLN
1.0000 g | INTRAVENOUS | Status: DC
  Filled 2019-11-12: qty 1

## 2019-11-12 MED ORDER — DOCUSATE SODIUM 100 MG PO CAPS: 100.0000 mg | ORAL_CAPSULE | Freq: Two times a day (BID) | ORAL | Status: DC | PRN

## 2019-11-12 MED ORDER — NOREPINEPHRINE 4 MG/250ML-% IV SOLN
0.0000 ug/min | INTRAVENOUS | Status: DC
  Administered 2019-11-12: 5 ug/min via INTRAVENOUS
  Administered 2019-11-12: 07:00:00 8 ug/min via INTRAVENOUS
  Administered 2019-11-12: 14:00:00 10 ug/min via INTRAVENOUS
  Filled 2019-11-12 (×2): qty 250

## 2019-11-12 MED ORDER — HYDROCORTISONE NA SUCCINATE PF 100 MG IJ SOLR
50.0000 mg | Freq: Four times a day (QID) | INTRAMUSCULAR | Status: DC
  Administered 2019-11-12 (×2): 50 mg via INTRAVENOUS
  Filled 2019-11-12 (×3): qty 2

## 2019-11-12 MED ORDER — PREDNISOLONE ACETATE 1 % OP SUSP
1.0000 [drp] | Freq: Every day | OPHTHALMIC | Status: DC
  Administered 2019-11-12: 10:00:00 1 [drp] via OPHTHALMIC
  Filled 2019-11-12: qty 5

## 2019-11-12 MED ORDER — ACETAMINOPHEN 650 MG RE SUPP: 650.0000 mg | Freq: Four times a day (QID) | RECTAL | Status: DC | PRN

## 2019-11-12 MED ORDER — DEXTROSE 50 % IV SOLN: 1.0000 | Freq: Once | INTRAVENOUS | Status: AC

## 2019-11-12 MED ORDER — MORPHINE 100MG IN NS 100ML (1MG/ML) PREMIX INFUSION
0.0000 mg/h | INTRAVENOUS | Status: DC
  Administered 2019-11-12: 16:00:00 5 mg/h via INTRAVENOUS
  Administered 2019-11-13: 03:00:00 7 mg/h via INTRAVENOUS
  Filled 2019-11-12 (×3): qty 100

## 2019-11-12 MED ORDER — SODIUM BICARBONATE 8.4 % IV SOLN
50.0000 meq | Freq: Once | INTRAVENOUS | Status: AC
  Administered 2019-11-12: 08:00:00 50 meq via INTRAVENOUS
  Filled 2019-11-12: qty 100

## 2019-11-12 MED ORDER — METRONIDAZOLE IN NACL 5-0.79 MG/ML-% IV SOLN
500.0000 mg | Freq: Once | INTRAVENOUS | Status: AC
  Administered 2019-11-12: 500 mg via INTRAVENOUS
  Filled 2019-11-12: qty 100

## 2019-11-12 MED ORDER — LACTATED RINGERS IV BOLUS (SEPSIS)
1000.0000 mL | Freq: Once | INTRAVENOUS | Status: AC
  Administered 2019-11-12: 1000 mL via INTRAVENOUS

## 2019-11-12 MED ORDER — VANCOMYCIN HCL IN DEXTROSE 1-5 GM/200ML-% IV SOLN: 1000.0000 mg | Freq: Once | INTRAVENOUS | Status: DC

## 2019-11-12 MED ORDER — VANCOMYCIN VARIABLE DOSE PER UNSTABLE RENAL FUNCTION (PHARMACIST DOSING): Status: DC

## 2019-11-12 MED ORDER — NITROGLYCERIN 0.4 MG SL SUBL: 0.4000 mg | SUBLINGUAL_TABLET | SUBLINGUAL | Status: DC | PRN

## 2019-11-12 MED ORDER — PANTOPRAZOLE SODIUM 40 MG IV SOLR
40.0000 mg | Freq: Two times a day (BID) | INTRAVENOUS | Status: DC
  Administered 2019-11-12: 10:00:00 40 mg via INTRAVENOUS
  Filled 2019-11-12: qty 40

## 2019-11-12 MED ORDER — VANCOMYCIN HCL 1250 MG/250ML IV SOLN
1250.0000 mg | Freq: Once | INTRAVENOUS | Status: AC
  Administered 2019-11-12: 1250 mg via INTRAVENOUS
  Filled 2019-11-12: qty 250

## 2019-11-12 MED ORDER — HEPARIN (PORCINE) 25000 UT/250ML-% IV SOLN: 800.0000 [IU]/h | INTRAVENOUS | Status: DC

## 2019-11-12 MED ORDER — MORPHINE BOLUS VIA INFUSION
5.0000 mg | INTRAVENOUS | Status: DC | PRN
  Filled 2019-11-12: qty 5

## 2019-11-12 MED ORDER — DEXTROSE 50 % IV SOLN
1.0000 | Freq: Once | INTRAVENOUS | Status: AC
  Administered 2019-11-12: 50 mL via INTRAVENOUS

## 2019-11-12 MED ORDER — DEXTROSE 50 % IV SOLN
INTRAVENOUS | Status: AC
  Administered 2019-11-12: 08:00:00 50 mL via INTRAVENOUS
  Filled 2019-11-12: qty 50

## 2019-11-12 MED ORDER — NOREPINEPHRINE 4 MG/250ML-% IV SOLN
INTRAVENOUS | Status: AC
  Administered 2019-11-12: 5 ug/min via INTRAVENOUS
  Filled 2019-11-12: qty 250

## 2019-11-12 MED ORDER — DIPHENHYDRAMINE HCL 50 MG/ML IJ SOLN: 25.0000 mg | INTRAMUSCULAR | Status: DC | PRN

## 2019-11-12 MED ORDER — TIMOLOL MALEATE 0.5 % OP SOLN
1.0000 [drp] | Freq: Two times a day (BID) | OPHTHALMIC | Status: DC
  Administered 2019-11-12: 10:00:00 1 [drp] via OPHTHALMIC
  Filled 2019-11-12: qty 5

## 2019-11-12 MED ORDER — MORPHINE SULFATE (PF) 2 MG/ML IV SOLN
2.0000 mg | INTRAVENOUS | Status: DC | PRN
  Administered 2019-11-12: 2 mg via INTRAVENOUS

## 2019-11-12 MED ORDER — DEXTROSE-NACL 5-0.9 % IV SOLN: INTRAVENOUS | Status: DC

## 2019-11-12 NOTE — ED Notes (Signed)
Bladder scan 200 ml 

## 2019-11-12 NOTE — Progress Notes (Addendum)
CSW spoke with Meryle Ready at [facility] to discuss this patient. The patient can return to the facility once medically ready for discharge.   Edwin Dada, MSW, LCSW-A Transitions of Care  Clinical Social Worker  [facility] Emergency Departments  Medical ICU [phone]

## 2019-11-12 NOTE — Progress Notes (Signed)
Dr. Celine Mans states, "no further escalation of care.  Spoke with Pt's daughter who is on the way."

## 2019-11-12 NOTE — Progress Notes (Signed)
RT unsuccessful in obtaining ABG at this time. Will get another RT to attempt.

## 2019-11-12 NOTE — Progress Notes (Signed)
PHARMACY - PHYSICIAN COMMUNICATION CRITICAL VALUE ALERT - BLOOD CULTURE IDENTIFICATION (BCID)  Tracey Porter is an 86 y.o. female who presented to [facility] on 2019/11/19 with a chief complaint of AMS from a nursing home.  Assessment:  2/2 group a strep  Name of physician (or Provider) Contacted: Floor pharmacist to relay to Dr. Celine Mans  Current antibiotics: cefepime, vancomycin, flagyl  Changes to prescribed antibiotics recommended:  Change to cefazolin and clindamycin if ok with CCM No results found for this or any previous visit.  [name] Nov 19, 2019  12:13 PM

## 2019-11-12 NOTE — Progress Notes (Signed)
NAME:  Tracey Porter, MRN:  [id number], DOB:  1929-11-14, LOS: 0 ADMISSION DATE:  Dec 01, 2019, CONSULTATION DATE:  Dec 01, 2019 REFERRING MD:  EDP, CHIEF COMPLAINT:  fever  Brief History   86 year old woman with history of a fib on Eliquis, HTN, DM, HFpEF, chronic hypoxic respiratory failure on 2L [place], dementia presenting from SNF with concern for sepsis.   Consults:    Procedures:    Significant Diagnostic Tests:    Micro Data:  UA negative COVID 19 negative  Antimicrobials:  5/3 vancomycin, cefepime, flagyl  Interim history/subjective:  Overnight maxed out on peripheral norepinephrine. Received vanc/cefepime/flagyl. Melanotic stool noted this morning.   Objective   Blood pressure (!) 96/57, pulse (!) 119, temperature 99.5 F (37.5 C), temperature source Oral, resp. rate (!) 32, height 5\' 4"  (1.626 m), weight 68 kg, SpO2 98 %.       No intake or output data in the 24 hours ending 12/01/19 0850 Filed Weights   12/01/19 0030 12-01-2019 0500  Weight: 67.7 kg 68 kg    Examination: General: chronically ill appearing  HENT: dry mucus membranes Lungs: clear to auscultation, no wheezes Cardiovascular: tachycardic, regular Abdomen: soft, diffusely tender to palpation Extremities: thin, no edema, no rashes.  Neuro: opens eyes to name. Does not follow commands.  MSK: DTI noted on sacrum (POA) Lines: PIV  Resolved Hospital Problem list    Assessment & Plan:  Tracey Porter is a 86 y.o. woman with history of dementia and functionally bed bound for the last few weeks.  She presents with fever and altered mental status at her nursing home.  Acute metabolic encephalopathy -From underlying dementia but now worsening with acute infectious etiology  Septic shock -Source unclear, procalcitonin 33, chest x-ray clear, UA but evidence of infection.  She was recently treated with for UTI at SNF -Continue vancomycin, cefepime, Flagyl empirically - CT abdomen pelvis ordered the  patient has not been stable enough to go down - Continue vasopressors peripherally, continue stress dose steroids -She has evidence of endorgan damage with renal failure, liver failure.  Acute blood loss anemia, Melena  -Holding heparin for A. fib - We will start PPI twice daily -N.p.o.   Atrial fibrillation on Eliquis - not rate controlled due to septic shock currently - holding heparin due to GI bleed  Anion gap metabolic acidosis secondary to elevated lactate - Likely secondary to shock liver in the setting of septic shock  Coagulopathy INR 2.4, likely secondary to shock liver   Best practice:  Diet: N.p.o. due to mental status and bleed Pain/Anxiety/Delirium protocol (if indicated):  VAP protocol (if indicated): N/A DVT prophylaxis: SCDs GI prophylaxis: PPI Glucose control: Refractory hypoglycemia, on D5 Foley none, pure wick Mobility: Bedrest Code Status: DNR, DNI Family Communication: Updated daughters over the phone this morning.  We discussed how mom is not responding right now to medical therapy with the pressors and antibiotics.  Concern for worsening organ failure secondary to septic shock.  They will be coming in to see her.  They do not want her to suffer. Disposition: Needs ICU  Labs/imaging   Chest x-ray reviewed and clear from 5/3 CBC: Recent Labs  Lab Dec 01, 2019 0032 2019-12-01 0559  WBC 12.2* 11.0*  NEUTROABS 8.8*  --   HGB 10.6* 12.5  HCT 32.2* 39.1  MCV 97.3 100.3*  PLT 216 540    Basic Metabolic Panel: Recent Labs  Lab 12/01/19 0032 Dec 01, 2019 0559  NA 140 139  K 3.1* 3.1*  CL 100 103  CO2 21* 17*  GLUCOSE 56* 55*  BUN 25* 24*  CREATININE 1.68* 1.49*  CALCIUM 7.5* 7.4*  MG  --  1.5*  PHOS  --  3.0   GFR: Estimated Creatinine Clearance: 25.2 mL/min (A) (by C-G formula based on SCr of 1.49 mg/dL (H)). Recent Labs  Lab 12-06-2019 0032 2019-12-06 0350 12/06/19 0559  PROCALCITON 33.58  --   --   WBC 12.2*  --  11.0*  LATICACIDVEN 8.2*  7.9* 10.8*    Liver Function Tests: Recent Labs  Lab 2019-12-06 0032 12-06-2019 0559  AST 474* 613*  ALT 178* 239*  ALKPHOS 95 126  BILITOT 1.2 2.0*  PROT 4.0* 4.4*  ALBUMIN 1.4* 1.6*   No results for input(s): LIPASE, AMYLASE in the last 168 hours. No results for input(s): AMMONIA in the last 168 hours.  ABG    Component Value Date/Time   PHART 7.419 03/24/2017 0850   PCO2ART 41.7 03/24/2017 0850   PO2ART 71.7 (L) 03/24/2017 0850   HCO3 26.5 03/24/2017 0850   O2SAT 95.0 03/24/2017 0850     Coagulation Profile: Recent Labs  Lab Dec 06, 2019 0032  INR 2.4*    Cardiac Enzymes: No results for input(s): CKTOTAL, CKMB, CKMBINDEX, TROPONINI in the last 168 hours.  HbA1C: Hgb A1c MFr Bld  Date/Time Value Ref Range Status  06/25/2019 05:00 AM 5.9 (H) 4.8 - 5.6 % Final    Comment:    (NOTE) Pre diabetes:          5.7%-6.4% Diabetes:              >6.4% Glycemic control for   <7.0% adults with diabetes   03/20/2017 04:04 AM 6.2 (H) 4.8 - 5.6 % Final    Comment:    (NOTE)         Prediabetes: 5.7 - 6.4         Diabetes: >6.4         Glycemic control for adults with diabetes: <7.0     CBG: Recent Labs  Lab 12-06-2019 0109 12-06-2019 0204 Dec 06, 2019 0804  GLUCAP 30* 110* 26*    Critical care time:   The patient is critically ill with multiple organ systems failure and requires high complexity decision making for assessment and support, frequent evaluation and titration of therapies, application of advanced monitoring technologies and extensive interpretation of multiple databases.   Critical Care Time devoted to patient care services described in this note is 55 minutes. This time reflects the time of my personal involvement. This critical care time does not reflect separately billable procedures or procedure time, teaching time or supervisory time of PA/NP/Med student/Med Resident etc but could involve care discussion time.  Mickel Baas Pulmonary and Critical  Care Medicine 12-06-19 8:50 AM  Pager: [phone] After hours pager: [phone]

## 2019-11-12 NOTE — Progress Notes (Addendum)
Pharmacy Antibiotic Note  Tracey Porter is a 86 y.o. female admitted on 05-Dec-2019 with sepsis.  Pharmacy has been consulted for vancomycin and cefepime dosing. Cefepime 2gm and vancomycin 1250 given earlier  Plan: Continue cefepime 2gm IV q24 hours Will dose vancomycin based on renal funtion F/u cultures and clinical course  Height: 5\' 4"  (162.6 cm) Weight: 67.7 kg (149 lb 4 oz) IBW/kg (Calculated) : 54.7  Temp (24hrs), Avg:99.5 F (37.5 C), Min:99.5 F (37.5 C), Max:99.5 F (37.5 C)  Recent Labs  Lab 2019-12-05 0032 12/05/19 0350 Dec 05, 2019 0559  WBC 12.2*  --  11.0*  CREATININE 1.68*  --   --   LATICACIDVEN 8.2* 7.9*  --     Estimated Creatinine Clearance: 22.3 mL/min (A) (by C-G formula based on SCr of 1.68 mg/dL (H)).    Allergies  Allergen Reactions  . Penicillins Rash    Did it involve swelling of the face/tongue/throat, SOB, or low BP? Unknown Did it involve sudden or severe rash/hives, skin peeling, or any reaction on the inside of your mouth or nose? Unknown Did you need to seek medical attention at a hospital or doctor's office? Unknown When did it last happen? If all above answers are "NO", may proceed with cephalosporin use.    Start heparin at 800 unit/hr for afib.  Check aPTT and heparin level in ~ 8 hours  Thank you for allowing pharmacy to be a part of this patient's care.  01/12/20 12-05-19 6:43 AM

## 2019-11-12 NOTE — Progress Notes (Signed)
Spoke with bedside RN Tyrone Sage who is preparing to draw 2nd Lactic Acid

## 2019-11-12 NOTE — ED Provider Notes (Addendum)
TIME SEEN: 12:33 AM  CHIEF COMPLAINT: code sepsis  HPI: Patient is an 86 year old female who lives in a skilled nursing facility and has history of A. fib on Eliquis, hypertension, diabetes, CHF, dementia, urinary retention who presents to the emergency department with EMS as a code sepsis.  Patient unable to provide any history at this time.  There is no family at bedside.  Spoke with Otelia Santee, nurse at [facility] in [place].  Reports patient is not peeing, not eating, fever 100.9, BP was 80/40, couldn't find a pulse but patient was awake, couldn't pick up an O2 sat. Just finished Cipro for UTI.  Reportedly a full code.  Patient wears 2 L of oxygen chronically.  Received Tylenol suppository prior to arrival.  Received 150 mg of amiodarone due to atrial fibrillation with RVR and 800 mL IV fluid bolus with EMS.  On review of her records it appears she has received COVID-19 vaccine on 09/13/2019 and 10/11/2019.  Had COVID infection and hospitalization in December 2020.   ROS: Level 5 caveat due to dementia  PAST MEDICAL HISTORY/PAST SURGICAL HISTORY:  Past Medical History:  Diagnosis Date  . Diabetes mellitus without complication (HCC)   . Hypertension     MEDICATIONS:  Prior to Admission medications   Medication Sig Start Date End Date Taking? Authorizing Provider  acetaminophen (TYLENOL) 500 MG tablet Take 1,000 mg by mouth See admin instructions. 1000 mg twice a day and 500 mg every 4 hours as needed for pain/headache    [provider]  amiodarone (PACERONE) 200 MG tablet Take 200 mg by mouth daily.    [provider]  apixaban (ELIQUIS) 5 MG TABS tablet Take 1 tablet (5 mg total) by mouth 2 (two) times daily. Please cancel previous rx's sent; call pt with price before filling 02/24/17   Azalee Course, PA  Cholecalciferol (VITAMIN D) 125 MCG (5000 UT) CAPS Take 5,000 Units by mouth daily.    [provider]  conjugated estrogens (PREMARIN) vaginal cream Place  1 Applicatorful vaginally every other day.    [provider]  ferrous sulfate 325 (65 FE) MG tablet Take 325 mg by mouth daily with breakfast.    [provider]  Lactobacillus Rhamnosus, GG, (CULTURELLE KIDS) CHEW Chew 1 tablet by mouth daily.    [provider]  lidocaine (LIDODERM) 5 % Place 1 patch onto the skin as needed (for pain).    [provider]  loperamide (IMODIUM) 2 MG capsule Take 1 capsule (2 mg total) by mouth as needed for diarrhea or loose stools. 03/31/17   Tyrone Nine, MD  magnesium chloride (SLOW-MAG) 64 MG TBEC SR tablet Take 64 mg by mouth daily.     [provider]  Melatonin 3 MG TABS Take 1 tablet (3 mg total) by mouth at bedtime. 06/29/19   Pokhrel, Rebekah Chesterfield, MD  metoprolol tartrate (LOPRESSOR) 25 MG tablet Take 1 tablet (25 mg total) by mouth 2 (two) times daily. 08/21/19   Almon Hercules, MD  Multiple Vitamins-Minerals (ICAPS PLUS) TABS Take 1 tablet by mouth 2 (two) times daily.    [provider]  nitroGLYCERIN (NITROSTAT) 0.4 MG SL tablet Place 0.4 mg under the tongue every 5 (five) minutes as needed for chest pain.    [provider]  ondansetron (ZOFRAN) 8 MG tablet Take 8 mg by mouth 2 (two) times daily as needed for nausea or vomiting.    [provider]  pantoprazole (PROTONIX) 40 MG tablet Take  40 mg by mouth daily.    [provider]  Polyethyl Glycol-Propyl Glycol (SYSTANE) 0.4-0.3 % SOLN Apply 1 drop to eye 2 (two) times daily.    [provider]  polyethylene glycol (MIRALAX / GLYCOLAX) packet Take 17 g by mouth daily. 05/04/18   Amin, Loura Halt, MD  prednisoLONE acetate (PRED FORTE) 1 % ophthalmic suspension Place 1 drop into the right eye daily.  03/19/17   [provider]  senna-docusate (SENOKOT-S) 8.6-50 MG tablet Take 1 tablet by mouth daily as needed for mild constipation.    [provider]  sertraline (ZOLOFT) 25 MG tablet Take 1 tablet (25 mg  total) by mouth daily. 08/21/19   Almon Hercules, MD  timolol (BETIMOL) 0.5 % ophthalmic solution Place 1 drop into both eyes 2 (two) times daily.    [provider]  traZODone (DESYREL) 50 MG tablet Take 50 mg by mouth at bedtime as needed for sleep.    [provider]  vitamin B-12 (CYANOCOBALAMIN) 1000 MCG tablet Take 1,000 mcg by mouth daily.    [provider]  vitamin C (ASCORBIC ACID) 500 MG tablet Take 500 mg by mouth 2 (two) times daily.    [provider]    ALLERGIES:  Allergies  Allergen Reactions  . Penicillins Rash    Did it involve swelling of the face/tongue/throat, SOB, or low BP? Unknown Did it involve sudden or severe rash/hives, skin peeling, or any reaction on the inside of your mouth or nose? Unknown Did you need to seek medical attention at a hospital or doctor's office? Unknown When did it last happen? If all above answers are "NO", may proceed with cephalosporin use.     SOCIAL HISTORY:  Social History   Tobacco Use  . Smoking status: Former Games developer  . Smokeless tobacco: Never Used  Substance Use Topics  . Alcohol use: Yes    FAMILY HISTORY: Family History  Problem Relation Age of Onset  . Lung cancer Father   . Lymphoma Daughter     EXAM: Pulse (!) 123   Temp 99.5 F (37.5 C) (Oral)   Resp 18   Ht 5\' 4"  (1.626 m)   Wt 67.7 kg   BMI 25.62 kg/m  CONSTITUTIONAL: Alert but does not answer questions or follow commands.  Keeps her eyes open.  Will grunt intermittently.  Elderly. HEAD: Normocephalic, atraumatic EYES: Conjunctivae clear, pupils appear equal, EOM appear intact, slightly pale conjunctive a ENT: normal nose; moist mucous membranes NECK: Supple, normal ROM, no meningismus CARD: Irregularly irregular and tachycardic; S1 and S2 appreciated; no murmurs, no clicks, no rubs, no gallops RESP: Normal chest excursion without splinting or tachypnea; breath sounds clear and equal bilaterally; no wheezes, no  rhonchi, no rales, no hypoxia on 3-4 L [place], no respiratory distress, speaking full sentences ABD/GI: Normal bowel sounds; non-distended; soft, non-tender, no rebound, no guarding, no peritoneal signs, no hepatosplenomegaly BACK:  The back appears normal EXT: Normal ROM in all joints; no deformity noted, no edema; no cyanosis SKIN: Normal color for age and race; warm; no rash on exposed skin NEURO: Does not answer questions or follow commands.  Keeps eyes open.  Grunts intermittently with passive movement of all extremities.   MEDICAL DECISION MAKING: Patient here as a code sepsis.  Febrile prior to arrival.  Tachycardic here in atrial fibrillation with RVR.  Will obtain manual blood pressure.  Will obtain labs, urine, chest x-ray, head CT, cultures, Covid swab.  Will give 30  mL/kg IV fluid bolus and broad-spectrum antibiotics.  I have attempted to call multiple emergency contacts including Robie Ridge, Lolita Rieger but unable to leave a voicemail as their voicemail boxes are full.  Have left a voicemail with Buelah Manis and Lynelle Doctor.    ED PROGRESS: Patient's blood pressure in the 80s/40s currently.  We will start all IV fluid boluses on pressure bags.  Patient may need vasopressors if no response from IV fluids.  Will hold on diltiazem or other antiarrhythmics at this time.  I suspect that her tachycardia is secondary to her sepsis.  Patient had approximately 200 mL in her bladder.  Nursing staff reports that her brief was wet.  She has had slight increase in her oxygen requirement.  Reportedly wears 2 L nasal cannula at baseline and is now requiring 3 to 4 L to maintain oxygen saturations in the 90s.  Chest x-ray reviewed/interpreted by myself and shows cardiomegaly without other acute abnormality.  Blood glucose in the 30s.  Will give D50.  No significant improvement in blood pressure after 2 L IV fluids.  Will start IV Levophed.  Will discuss with critical care for  admission.  Urinalysis reviewed/interpreted and does not show acute infection.   1:35 AM  Spoke with Dr. Genevive Bi with CCM.  They will see in consult.  Also discussed patient's critical status with her daughter Anderson Malta by phone.  Have discussed CODE STATUS and goals of care.  She would like to further discuss with her sister, Manuela Schwartz, prior to making any decisions.  It sounds like patient is rather severely demented, bedbound at baseline.   1:45 AM  Pt's blood pressure has improved on 10 of Levophed. Satting in the mid 90s on 4 L nasal cannula. No respiratory distress after IV fluids.  2:00 AM  Pt's lactate > 8.  Spoke with Janne Napoleon, patient's daughter's by phone.  They are coming to ED to see patient.  They report she is a DNR/DNI.  They would like to continue IVF, IV antibiotics, vasopressors.  Will decide on central line when they arrive to ED.  Repeat CBG 110.  COVID negative.  Labs reviewed/interpreted.  Labs showed AKI, elevated liver function tests consistent with shock liver, leukocytosis of 12,000 with left shift.  INR elevated at 2.4.  She is on Eliquis.   2:15 AM  CCM physician, Dr. Randell Patient at bedside.  Will hold on bed placement orders at this time until we have had further discussion of goals of care with family members once they arrive.  Patient now seems to have more of a wet, gurgling cough.  Maintaining sats however on 4 L nasal cannula.  2:40 AM  Pt's daughters and son-in-law at bedside.  At this time they would like to continue DNR/DNI but would like to continue with vasopressors, IV fluids, antibiotics.  Still seem unsure regarding central line.  Will continue levo gtt through PIV at this time.  Explained central line needed if continue vasopressors.  Will update critical care team.  Critical care doctor stated she was planning to pan scan patient if family wanted continued treatment rather than comfort care.  I reviewed all nursing notes and pertinent previous  records as available.  I have reviewed and interpreted any EKGs, lab and urine results, imaging (as available).    EKG Interpretation  Date/Time:  11/20/19 00:19:14 EDT Ventricular Rate:  127 PR Interval:    QRS Duration: 86 QT Interval:  312 QTC Calculation: 937  R Axis:   97 Text Interpretation: Atrial fibrillation Right axis deviation Low voltage, precordial leads Borderline T abnormalities, diffuse leads Confirmed by Tarrance Januszewski, Baxter Hire [phone]) on 11/10/2019 12:26:57 AM        CRITICAL CARE Performed by: Baxter Hire Lysette Lindenbaum   Total critical care time: 85 minutes  Critical care time was exclusive of separately billable procedures and treating other patients.  Critical care was necessary to treat or prevent imminent or life-threatening deterioration.  Critical care was time spent personally by me on the following activities: development of treatment plan with patient and/or surrogate as well as nursing, discussions with consultants, evaluation of patient's response to treatment, examination of patient, obtaining history from patient or surrogate, ordering and performing treatments and interventions, ordering and review of laboratory studies, ordering and review of radiographic studies, pulse oximetry and re-evaluation of patient's condition.   Dajsha Massaro was evaluated in Emergency Department on 11/23/2019 for the symptoms described in the history of present illness. She was evaluated in the context of the global COVID-19 pandemic, which necessitated consideration that the patient might be at risk for infection with the SARS-CoV-2 virus that causes COVID-19. Institutional protocols and algorithms that pertain to the evaluation of patients at risk for COVID-19 are in a state of rapid change based on information released by regulatory bodies including the CDC and federal and state organizations. These policies and algorithms were followed during the patient's care in the ED.       Rogers Ditter, Layla Maw, DO 11/18/2019 0243    Berkeley Veldman, Layla Maw, DO 11/14/2019 8341

## 2019-11-12 NOTE — ED Notes (Signed)
Date and time results received: 12/08/2019   Test:Lactic Acid Critical Value: 8.2  Name of Provider Notified: Annice Needy  Orders Received? Or Actions Taken?: No orders received

## 2019-11-12 NOTE — ED Notes (Signed)
Patient was ahard stick unable to stick reported to nurse

## 2019-11-12 NOTE — Progress Notes (Signed)
The chaplain responded to a page for a request for the "Anointing of the sick" (Last Rites). Upon arrival the family stated that a blessing from the chaplain was sufficient so the chaplain prayed with the patient and the family. The chaplain does not assess that a follow-up is needed.  Lavone Neri Chaplain Resident For questions concerning this note please contact me by pager [phone]

## 2019-11-12 NOTE — ED Notes (Signed)
CBG 30. RN Tyrone Sage notified.

## 2019-11-12 NOTE — Progress Notes (Signed)
RT attempted to obtain ABG.  RT was unsuccessful.  RN made aware.

## 2019-11-12 NOTE — Progress Notes (Signed)
Communicated with bedside RN again concerning results of lactic acid, continue to monitor for results

## 2019-11-12 NOTE — H&P (Signed)
NAME:  Joscelynn Brutus, MRN:  [id number], DOB:  [birth year], LOS: 0 ADMISSION DATE:  11-30-19, CONSULTATION DATE:  11/30/2019 REFERRING MD:  ED, CHIEF COMPLAINT:  fever  Brief History   86 year old woman with history of a fib on Eliquis, HTN, DM, HFpEF, chronic hypoxic respiratory failure on 2L [place], dementia presenting from SNF with concern for sepsis.   History of present illness   86 year old woman with history of a fib on Eliquis, HTN, DM, HFpEF, chronic hypoxic respiratory failure on 2L [place], dementia presenting from SNF with fever. Has had a fever to 100.9 and lethargy. Has reportedly had anorexia and decreased urine output. Was treated for UTI last week with cipro. Received 150mg  of Amio and 819ml of NS from EMS. Unable to obtain history from patient due to dementia and acute encephalopathy. History obtained from EMR, ED physician, and bedside nurse.  In the ED, she received 2.25L LR, vanc, cefepime, flagyl. She was started on Levophed gtt  Past Medical History  a fib on Eliquis, HTN, DM, HFpEF, dementia   Significant Hospital Events   5/3> admit  Consults:  None  Procedures:  None  Significant Diagnostic Tests:  CXR 5/3> No active cardiopulmonary disease. Stable cardiomegaly.  TTE 2/2> LV EF 65 to 70%. Left ventricular septal wall thickness was severely increased. Severely increased left ventricular posterior wall thickness. Inadequate for regional wall motion assessment. Grade I diastolic dysfunction (impaired relaxation). Left atrial size was mildly dilated. Moderate calcification of the mitral valve leaflet(s). Moderate thickening of the mitral valve leaflet(s). Severe mitral annular calcification. Mild mitral valve regurgitation. The aortic valve is tricuspid. There is severe calcifcation of the aortic valve. There is severe thickening of the aortic valve. Pulmonic valve regurgitation is mild.   Micro Data:  COVID 5/3> Neg Influenza 5/3> Neg Urine Cx 5/3> BCx  5/3>  Antimicrobials:  Vanc 5/3> Cefepime 5/3> Flagyl 5/3>  Interim history/subjective:  Intermittently able to answer with yes or no. Denies pain or dyspnea. Reports cough.  Objective   Blood pressure 91/66, pulse (!) 119, temperature 99.5 F (37.5 C), temperature source Oral, resp. rate (!) 25, height 5\' 4"  (1.626 m), weight 67.7 kg, SpO2 98 %.       No intake or output data in the 24 hours ending 11-30-2019 0223 Filed Weights   11/30/19 0030  Weight: 67.7 kg    Examination: General: NAD HENT: [place]/AT, EOMI, PERRL Lungs: Rhonchi bilaterally but no wheezes or crackles Cardiovascular: Tachycardic and irregular Abdomen: Soft, nontender Extremities: Some skin tenting present, no LE edema Neuro: Awake, answers some questions with yes or no, follows commands  Assessment & Plan:  86 year old woman with history of a fib on Eliquis, HTN, DM, HFpEF, chronic hypoxic respiratory failure on 2L [place], dementia presenting from SNF with concern for sepsis.   Probable septic shock with unknown infection source, lactic acidosis: Elevated procalcitonin and leukocytosis. UA does not appear to have UTI --CT chest/abd/pelvis --Stress dose steroids --Levo gtt titrate for goal MAP >65 --Given IV fluids. Trend lactic acid --Continue vanc/cefepime/flagyl --Follow up cultures  Acute on chronic hypoxic respiratory failure, history of HFpEF --Titrate O2 for goal O2 sat >88%  AKI, hypokalemia, metabolic acidosis with increased anion gap: Possible from hypovolemia. --Replete K --IV fluids given. Monitor urine output  Transamninitis: Likely from shock liver --Monitor LFTs --hepatitis panel  Acute anemia: Baseline hgb is 12 and hgb on admission is 10.6. Currently no active source of bleed. Possible etiology from above. --Monitor Hgb  DM2, hypoglycemia: hypoglycemia  is resolved. --Monitor CBG  A fib: --Hold Eliquis --Hep gtt --Continue amiodarone  Continue home eye drops  Best practice:   Diet: NPO Pain/Anxiety/Delirium protocol (if indicated): NA VAP protocol (if indicated): NA DVT prophylaxis: Hep gtt GI prophylaxis: NA Glucose control: Monitor Mobility: PT when able Code Status: DNR/DNI Family Communication: ED discussed with family and they report that she is DNR/DNI but would like to continue full care otherwise Disposition: ICU  Labs   CBC: Recent Labs  Lab Dec 06, 2019 0032  WBC 12.2*  NEUTROABS 8.8*  HGB 10.6*  HCT 32.2*  MCV 97.3  PLT 216    Basic Metabolic Panel: Recent Labs  Lab 2019/12/06 0032  NA 140  K 3.1*  CL 100  CO2 21*  GLUCOSE 56*  BUN 25*  CREATININE 1.68*  CALCIUM 7.5*   GFR: Estimated Creatinine Clearance: 22.3 mL/min (A) (by C-G formula based on SCr of 1.68 mg/dL (H)). Recent Labs  Lab 12-06-2019 0032  PROCALCITON 33.58  WBC 12.2*  LATICACIDVEN 8.2*    Liver Function Tests: Recent Labs  Lab Dec 06, 2019 0032  AST 474*  ALT 178*  ALKPHOS 95  BILITOT 1.2  PROT 4.0*  ALBUMIN 1.4*   No results for input(s): LIPASE, AMYLASE in the last 168 hours. No results for input(s): AMMONIA in the last 168 hours.  ABG    Component Value Date/Time   PHART 7.419 03/24/2017 0850   PCO2ART 41.7 03/24/2017 0850   PO2ART 71.7 (L) 03/24/2017 0850   HCO3 26.5 03/24/2017 0850   O2SAT 95.0 03/24/2017 0850     Coagulation Profile: Recent Labs  Lab 12/06/19 0032  INR 2.4*    Cardiac Enzymes: No results for input(s): CKTOTAL, CKMB, CKMBINDEX, TROPONINI in the last 168 hours.  HbA1C: Hgb A1c MFr Bld  Date/Time Value Ref Range Status  06/25/2019 05:00 AM 5.9 (H) 4.8 - 5.6 % Final    Comment:    (NOTE) Pre diabetes:          5.7%-6.4% Diabetes:              >6.4% Glycemic control for   <7.0% adults with diabetes   03/20/2017 04:04 AM 6.2 (H) 4.8 - 5.6 % Final    Comment:    (NOTE)         Prediabetes: 5.7 - 6.4         Diabetes: >6.4         Glycemic control for adults with diabetes: <7.0     CBG: Recent Labs  Lab  12/06/19 0109 2019-12-06 0204  GLUCAP 30* 110*    Review of Systems:   Unable to obtain due to encephalopathy  Past Medical History  She,  has a past medical history of Diabetes mellitus without complication (HCC) and Hypertension.   Surgical History    Past Surgical History:  Procedure Laterality Date  . ABDOMINAL HYSTERECTOMY  1976  . EYE SURGERY    . HIP ARTHROPLASTY Right 05/03/2018   Procedure: ARTHROPLASTY BIPOLAR HIP (HEMIARTHROPLASTY);  Surgeon: Roby Lofts, MD;  Location: [facility] OR;  Service: Orthopedics;  Laterality: Right;     Social History   reports that she has quit smoking. She has never used smokeless tobacco. She reports current alcohol use. She reports that she does not use drugs.   Family History   Her family history includes Lung cancer in her father; Lymphoma in her daughter.   Allergies Allergies  Allergen Reactions  . Penicillins Rash    Did it involve swelling  of the face/tongue/throat, SOB, or low BP? Unknown Did it involve sudden or severe rash/hives, skin peeling, or any reaction on the inside of your mouth or nose? Unknown Did you need to seek medical attention at a hospital or doctor's office? Unknown When did it last happen? If all above answers are "NO", may proceed with cephalosporin use.      Home Medications  Prior to Admission medications   Medication Sig Start Date End Date Taking? Authorizing Provider  acetaminophen (TYLENOL) 500 MG tablet Take 1,000 mg by mouth in the morning and at bedtime.    Yes [provider]  acetaminophen (TYLENOL) 500 MG tablet Take 500 mg by mouth every 4 (four) hours as needed for mild pain or headache.   Yes [provider]  amiodarone (PACERONE) 200 MG tablet Take 200 mg by mouth daily.   Yes [provider]  apixaban (ELIQUIS) 5 MG TABS tablet Take 1 tablet (5 mg total) by mouth 2 (two) times daily. Please cancel previous rx's sent; call pt with price before filling 02/24/17   Yes Azalee Course, PA  calcium carbonate (TUMS - DOSED IN MG ELEMENTAL CALCIUM) 500 MG chewable tablet Chew 2 tablets by mouth 3 (three) times daily.   Yes [provider]  Cholecalciferol (VITAMIN D) 125 MCG (5000 UT) CAPS Take 5,000 Units by mouth daily.   Yes [provider]  conjugated estrogens (PREMARIN) vaginal cream Place 1 Applicatorful vaginally every other day.   Yes [provider]  furosemide (LASIX) 20 MG tablet Take 20 mg by mouth daily.   Yes [provider]  lidocaine (LIDODERM) 5 % Place 1 patch onto the skin as needed (for pain).   Yes [provider]  loperamide (IMODIUM) 2 MG capsule Take 1 capsule (2 mg total) by mouth as needed for diarrhea or loose stools. 03/31/17  Yes Tyrone Nine, MD  magnesium chloride (SLOW-MAG) 64 MG TBEC SR tablet Take 64 mg by mouth daily.    Yes [provider]  Melatonin 3 MG TABS Take 1 tablet (3 mg total) by mouth at bedtime. 06/29/19  Yes Pokhrel, Laxman, MD  Multiple Vitamins-Minerals (ICAPS PLUS) TABS Take 1 tablet by mouth 2 (two) times daily.   Yes [provider]  nitroGLYCERIN (NITROSTAT) 0.4 MG SL tablet Place 0.4 mg under the tongue every 5 (five) minutes as needed for chest pain.   Yes [provider]  ondansetron (ZOFRAN) 4 MG tablet Take 4 mg by mouth every 6 (six) hours as needed for nausea or vomiting.   Yes [provider]  pantoprazole (PROTONIX) 40 MG tablet Take 40 mg by mouth daily.   Yes [provider]  polyethylene glycol (MIRALAX / GLYCOLAX) packet Take 17 g by mouth daily. 05/04/18  Yes Amin, Ankit Chirag, MD  potassium chloride (KLOR-CON) 10 MEQ tablet Take 10 mEq by mouth daily.   Yes [provider]  prednisoLONE acetate (PRED FORTE) 1 % ophthalmic suspension Place 1 drop into the right eye daily.  03/19/17  Yes [provider]  Probiotic Product (CULTURELLE PROBIOTICS) CHEW Chew 1 each by mouth daily.   Yes [provider]  senna (SENOKOT) 8.6 MG tablet Take 1 tablet by mouth daily as needed for constipation.   Yes [provider]  senna-docusate (SENOKOT-S) 8.6-50 MG tablet Take 1 tablet by mouth at bedtime as needed for mild constipation.    Yes [provider]  timolol (BETIMOL) 0.5 % ophthalmic solution Place 1  drop into both eyes 2 (two) times daily.   Yes [provider]  traZODone (DESYREL) 50 MG tablet Take 25 mg by mouth at bedtime.    Yes [provider]  vitamin B-12 (CYANOCOBALAMIN) 1000 MCG tablet Take 1,000 mcg by mouth daily.   Yes [provider]  vitamin C (ASCORBIC ACID) 500 MG tablet Take 500 mg by mouth 2 (two) times daily.   Yes [provider]  metoprolol tartrate (LOPRESSOR) 25 MG tablet Take 1 tablet (25 mg total) by mouth 2 (two) times daily. Patient not taking: Reported on 11/17/2019 08/21/19   Almon Hercules, MD  sertraline (ZOLOFT) 25 MG tablet Take 1 tablet (25 mg total) by mouth daily. Patient not taking: Reported on 11/19/2019 08/21/19   Almon Hercules, MD     Critical care time: The patient is critically ill with multiple organ systems failure and requires high complexity decision making for assessment and support, frequent evaluation and titration of therapies, application of advanced monitoring technologies and extensive interpretation of multiple databases.   Critical Care Time devoted to patient care services described in this note is  65 Minutes. This time reflects time of care of this signee. This critical care time does not reflect procedure time, or teaching time or supervisory time of PA/NP/Med student/Med Resident etc but could involve care discussion time.  Griffin Basil, M.D. [facility] Pulmonary/Critical Care Medicine After hours pager: [phone].

## 2019-11-12 NOTE — Progress Notes (Signed)
Bedside Greg aware of need for further lactic acid orders for sepsis protocol

## 2019-11-12 NOTE — Progress Notes (Signed)
eLink Physician-Brief Progress Note Patient Name: Tracey Porter DOB: March 15, 1930 MRN: [id number]   Date of Service  12/01/2019  HPI/Events of Note  56 F HFpEF, HTN, DM, afib on apixaban, dementia brought in from SNF for fever and lethargy. Also with decreased PO intake.  eICU Interventions  On broad spectrum antibiotics, source unclear at this time. CT abdomen pending. Ordered albumin to facilitate coming down pressors.     Intervention Category Major Interventions: Hypotension - evaluation and management;Sepsis - evaluation and management Evaluation Type: New Patient Evaluation  Darl Pikes 12/09/2019, 6:29 AM

## 2019-11-12 NOTE — ED Triage Notes (Signed)
Pt arrived via ems from [facility] code sepsis. Pt has had n/v and lethargic x2days. Temp of 100.9 prior to tylenol. Pt was treated for a UTI last week finished her prescription of cipro. EMS states pt was tachy (130-160) hx of afib. Pt received 150mg  of Amio and of NS

## 2019-11-14 LAB — CULTURE, BLOOD (ROUTINE X 2): Special Requests: ADEQUATE

## 2019-11-17 LAB — CULTURE, BLOOD (ROUTINE X 2): Culture: NO GROWTH

## 2019-12-04 ENCOUNTER — Ambulatory Visit: Payer: Medicare Other | Admitting: Cardiovascular Disease

## 2019-12-11 NOTE — Progress Notes (Signed)
Pt unresponsive to verbal & physical stimuli. Absent heart and breath sounds. Pupils fixed and dilated. Pronounced dead at [phone]. Verified with Francina Ames. Daughter, Victorino Dike called and left msg. Called again at 0940 and spoke directly with Jeniffer Criseulol.

## 2019-12-11 NOTE — Progress Notes (Signed)
Spoke to [employer] with CDS per protocol. Pt is NOT a candidate for any type of donation. Please call back with cardiac time of death.  Referral number is [id number].

## 2019-12-11 NOTE — Death Summary Note (Signed)
DEATH SUMMARY   Patient Details  Name: Tracey Porter MRN: [id number] DOB: 1930-04-14  Admission/Discharge Information   Admit Date:  11-22-19  Date of Death: Date of Death: 11/23/2019  Time of Death: Time of Death: 0833  Length of Stay: 1  Referring Physician: Florentina Jenny, MD   Reason(s) for Hospitalization  encephalopathy  Diagnoses  Preliminary cause of death: Septic Shock  Secondary Diagnoses (including complications and co-morbidities):  Active Problems:   Septic shock (HCC) Group A Strep bacteremia Dementia Debility Atrial Fibrillation  DM2   Brief Hospital Course (including significant findings, care, treatment, and services provided and events leading to death)  Tracey Porter was an 86 year old woman with history of Atrial Fibrillation on Eliquis, HTN, DM, HFpEF, chronic hypoxic respiratory failure on 2L [place], dementia presenting from SNF with fever. She had a fever to 100.9 and lethargy. Has reportedly had anorexia and decreased urine output. Was treated for UTI a week prior to admission with cipro. Further history from her family revealed that she had dementia and was declining over the last year due to covid. They noted she was mostly bed bound and they hadn't been able to visit her as much as they would have liked due to covid restrictions.  The patient was resuscitated with IV fluids and started on broad spectrum antibiotic therapy.  She was started on vasopressors.  Unfortunately despite this treatment she continued to decline with shock liver, persistently elevated lactic acidosis and kidney injury.  She was persistent encephalopathic.  Her daughters were contacted and did make her a DNR and DNI.  Her blood cultures subsequently came back positive for Group A Streptococcus.  Given her septic shock and multi-organ failure and in the setting of her advanced age and co-morbidities, family elected to focus on her comfort measures. She expired peacefully at 8:33. Her  family was notified.   Pertinent Labs and Studies  Significant Diagnostic Studies DG Chest Port 1 View  Result Date: 22-Nov-2019 CLINICAL DATA:  86 year old female with sepsis. EXAM: PORTABLE CHEST 1 VIEW COMPARISON:  Chest radiograph dated 08/13/2019. FINDINGS: No focal consolidation, pleural effusion, pneumothorax. Stable cardiomegaly. Calcification of the mitral annulus. Atherosclerotic calcification of the aorta. Osteopenia with degenerative changes of the spine. No acute osseous pathology. IMPRESSION: 1. No active cardiopulmonary disease. 2. Stable cardiomegaly. Electronically Signed   By: Elgie Collard M.D.   On: 11/22/19 00:51    Microbiology Recent Results (from the past 240 hour(s))  Blood Culture (routine x 2)     Status: Abnormal (Preliminary result)   Collection Time: 11/22/19 12:30 AM   Specimen: BLOOD RIGHT FOREARM  Result Value Ref Range Status   Specimen Description BLOOD RIGHT FOREARM  Final   Special Requests   Final    BOTTLES DRAWN AEROBIC AND ANAEROBIC Blood Culture adequate volume   Culture  Setup Time   Final    GRAM POSITIVE COCCI IN CHAINS IN BOTH AEROBIC AND ANAEROBIC BOTTLES Organism ID to follow CRITICAL RESULT CALLED TO, READ BACK BY AND VERIFIED WITH: Gala Lewandowsky PharmD 12:15 11/22/2019 ([email or web address])    Culture (A)  Final    GROUP A STREP (S.PYOGENES) ISOLATED SUSCEPTIBILITIES TO FOLLOW Performed at [facility], [address] [place], [place] [address]    Report Status PENDING  Incomplete  Blood Culture ID Panel (Reflexed)     Status: Abnormal   Collection Time: 2019/11/22 12:30 AM  Result Value Ref Range Status   Enterococcus species NOT DETECTED NOT DETECTED Final   Listeria monocytogenes  NOT DETECTED NOT DETECTED Final   Staphylococcus species NOT DETECTED NOT DETECTED Final   Staphylococcus aureus (BCID) NOT DETECTED NOT DETECTED Final   Streptococcus species DETECTED (A) NOT DETECTED Final    Comment: CRITICAL RESULT CALLED TO, READ BACK BY  AND VERIFIED WITH: Sharen Heck PharmD 12:15 11/16/2019 ([email or web address])    Streptococcus agalactiae NOT DETECTED NOT DETECTED Final   Streptococcus pneumoniae NOT DETECTED NOT DETECTED Final   Streptococcus pyogenes DETECTED (A) NOT DETECTED Final    Comment: CRITICAL RESULT CALLED TO, READ BACK BY AND VERIFIED WITH: Sharen Heck PharmD 12:15 11/18/2019 ([email or web address])    Acinetobacter baumannii NOT DETECTED NOT DETECTED Final   Enterobacteriaceae species NOT DETECTED NOT DETECTED Final   Enterobacter cloacae complex NOT DETECTED NOT DETECTED Final   Escherichia coli NOT DETECTED NOT DETECTED Final   Klebsiella oxytoca NOT DETECTED NOT DETECTED Final   Klebsiella pneumoniae NOT DETECTED NOT DETECTED Final   Proteus species NOT DETECTED NOT DETECTED Final   Serratia marcescens NOT DETECTED NOT DETECTED Final   Haemophilus influenzae NOT DETECTED NOT DETECTED Final   Neisseria meningitidis NOT DETECTED NOT DETECTED Final   Pseudomonas aeruginosa NOT DETECTED NOT DETECTED Final   Candida albicans NOT DETECTED NOT DETECTED Final   Candida glabrata NOT DETECTED NOT DETECTED Final   Candida krusei NOT DETECTED NOT DETECTED Final   Candida parapsilosis NOT DETECTED NOT DETECTED Final   Candida tropicalis NOT DETECTED NOT DETECTED Final    Comment: Performed at [facility], [place]. [address], [place], [place] [address]  Urine culture     Status: None   Collection Time: 12/02/2019 12:32 AM   Specimen: In/Out Cath Urine  Result Value Ref Range Status   Specimen Description IN/OUT CATH URINE  Final   Special Requests NONE  Final   Culture   Final    NO GROWTH Performed at [facility], [place] [address], [place], [place] [address]    Report Status 11/11/2019 FINAL  Final  Respiratory Panel by RT PCR (Flu A&B, Covid) - Nasopharyngeal Swab     Status: None   Collection Time: 11/25/2019 12:37 AM   Specimen: Nasopharyngeal Swab  Result Value Ref Range Status   SARS Coronavirus 2 by RT PCR NEGATIVE  NEGATIVE Final    Comment: (NOTE) SARS-CoV-2 target nucleic acids are NOT DETECTED. The SARS-CoV-2 RNA is generally detectable in upper respiratoy specimens during the acute phase of infection. The lowest concentration of SARS-CoV-2 viral copies this assay can detect is 131 copies/mL. A negative result does not preclude SARS-Cov-2 infection and should not be used as the sole basis for treatment or other patient management decisions. A negative result may occur with  improper specimen collection/handling, submission of specimen other than nasopharyngeal swab, presence of viral mutation(s) within the areas targeted by this assay, and inadequate number of viral copies (<131 copies/mL). A negative result must be combined with clinical observations, patient history, and epidemiological information. The expected result is Negative. Fact Sheet for Patients:  PinkCheek.be Fact Sheet for Healthcare Providers:  GravelBags.it This test is not yet ap proved or cleared by the [place] FDA and  has been authorized for detection and/or diagnosis of SARS-CoV-2 by FDA under an Emergency Use Authorization (EUA). This EUA will remain  in effect (meaning this test can be used) for the duration of the COVID-19 declaration under Section 564(b)(1) of the Act, 21 U.S.C. section 360bbb-3(b)(1), unless the authorization is terminated or revoked sooner.    Influenza A  by PCR NEGATIVE NEGATIVE Final   Influenza B by PCR NEGATIVE NEGATIVE Final    Comment: (NOTE) The Xpert Xpress SARS-CoV-2/FLU/RSV assay is intended as an aid in  the diagnosis of influenza from Nasopharyngeal swab specimens and  should not be used as a sole basis for treatment. Nasal washings and  aspirates are unacceptable for Xpert Xpress SARS-CoV-2/FLU/RSV  testing. Fact Sheet for Patients: https://www.moore.com/ Fact Sheet for Healthcare  Providers: https://www.young.biz/ This test is not yet approved or cleared by the [place] FDA and  has been authorized for detection and/or diagnosis of SARS-CoV-2 by  FDA under an Emergency Use Authorization (EUA). This EUA will remain  in effect (meaning this test can be used) for the duration of the  Covid-19 declaration under Section 564(b)(1) of the Act, 21  U.S.C. section 360bbb-3(b)(1), unless the authorization is  terminated or revoked. Performed at [facility], [address], [place], [place] [address]   Blood Culture (routine x 2)     Status: None (Preliminary result)   Collection Time: 12/07/2019 11:46 AM   Specimen: BLOOD LEFT HAND  Result Value Ref Range Status   Specimen Description BLOOD LEFT HAND  Final   Special Requests   Final    AEROBIC BOTTLE ONLY Blood Culture results may not be optimal due to an inadequate volume of blood received in culture bottles   Culture   Final    NO GROWTH < 24 HOURS Performed at [facility], [address], [place], [place] [address]    Report Status PENDING  Incomplete    Lab Basic Metabolic Panel: Recent Labs  Lab 11/21/2019 0032 11/17/2019 0559  NA 140 139  K 3.1* 3.1*  CL 100 103  CO2 21* 17*  GLUCOSE 56* 55*  BUN 25* 24*  CREATININE 1.68* 1.49*  CALCIUM 7.5* 7.4*  MG  --  1.5*  PHOS  --  3.0   Liver Function Tests: Recent Labs  Lab 11/30/2019 0032 11/23/2019 0559  AST 474* 613*  ALT 178* 239*  ALKPHOS 95 126  BILITOT 1.2 2.0*  PROT 4.0* 4.4*  ALBUMIN 1.4* 1.6*   No results for input(s): LIPASE, AMYLASE in the last 168 hours. No results for input(s): AMMONIA in the last 168 hours. CBC: Recent Labs  Lab 12/06/2019 0032 12/04/2019 0559  WBC 12.2* 11.0*  NEUTROABS 8.8*  --   HGB 10.6* 12.5  HCT 32.2* 39.1  MCV 97.3 100.3*  PLT 216 240   Cardiac Enzymes: No results for input(s): CKTOTAL, CKMB, CKMBINDEX, TROPONINI in the last 168 hours. Sepsis Labs: Recent Labs  Lab  12/10/2019 0032 12/09/2019 0032 12/08/2019 0350 12/06/2019 0559 11/25/2019 0802 12/08/2019 1146  PROCALCITON 33.58  --   --   --   --   --   WBC 12.2*  --   --  11.0*  --   --   LATICACIDVEN 8.2*   < > 7.9* 10.8* >11.0* >11.0*   < > = values in this interval not displayed.   Durel Salts, MD Pulmonary and Critical Care Medicine [facility] Pager: [phone] Office:[phone]

## 2019-12-11 DEATH — deceased
